# Patient Record
Sex: Female | Born: 1935 | Race: White | Hispanic: No | Marital: Married | Smoking: Former smoker
Health system: Southern US, Community
[De-identification: ages and names within clinical notes are randomized; demographics above are authoritative.]

## PROBLEM LIST (undated history)

## (undated) DIAGNOSIS — H9190 Unspecified hearing loss, unspecified ear: Secondary | ICD-10-CM

## (undated) DIAGNOSIS — M47816 Spondylosis without myelopathy or radiculopathy, lumbar region: Secondary | ICD-10-CM

## (undated) DIAGNOSIS — E785 Hyperlipidemia, unspecified: Secondary | ICD-10-CM

## (undated) DIAGNOSIS — J45909 Unspecified asthma, uncomplicated: Secondary | ICD-10-CM

## (undated) DIAGNOSIS — I739 Peripheral vascular disease, unspecified: Secondary | ICD-10-CM

## (undated) DIAGNOSIS — H5461 Unqualified visual loss, right eye, normal vision left eye: Secondary | ICD-10-CM

## (undated) DIAGNOSIS — I779 Disorder of arteries and arterioles, unspecified: Secondary | ICD-10-CM

## (undated) DIAGNOSIS — I2510 Atherosclerotic heart disease of native coronary artery without angina pectoris: Secondary | ICD-10-CM

## (undated) DIAGNOSIS — I10 Essential (primary) hypertension: Secondary | ICD-10-CM

## (undated) HISTORY — DX: Atherosclerotic heart disease of native coronary artery without angina pectoris: I25.10

## (undated) HISTORY — DX: Essential (primary) hypertension: I10

## (undated) HISTORY — PX: APPENDECTOMY: SHX54

## (undated) HISTORY — DX: Hyperlipidemia, unspecified: E78.5

## (undated) HISTORY — DX: Disorder of arteries and arterioles, unspecified: I77.9

## (undated) HISTORY — DX: Unqualified visual loss, right eye, normal vision left eye: H54.61

## (undated) HISTORY — DX: Unspecified hearing loss, unspecified ear: H91.90

## (undated) HISTORY — DX: Peripheral vascular disease, unspecified: I73.9

## (undated) HISTORY — DX: Unspecified asthma, uncomplicated: J45.909

## (undated) HISTORY — DX: Spondylosis without myelopathy or radiculopathy, lumbar region: M47.816

---

## 1973-06-08 HISTORY — PX: ABDOMINAL HYSTERECTOMY: SHX81

## 1992-06-08 HISTORY — PX: TONSILLECTOMY: SUR1361

## 2006-08-07 HISTORY — PX: CORONARY ANGIOPLASTY WITH STENT PLACEMENT: SHX49

## 2010-06-08 HISTORY — PX: RENAL ANGIOGRAM: SHX6061

## 2012-02-12 ENCOUNTER — Other Ambulatory Visit: Payer: Self-pay | Admitting: Family Medicine

## 2012-02-12 DIAGNOSIS — Z1231 Encounter for screening mammogram for malignant neoplasm of breast: Secondary | ICD-10-CM

## 2012-03-02 ENCOUNTER — Ambulatory Visit
Admission: RE | Admit: 2012-03-02 | Discharge: 2012-03-02 | Disposition: A | Discharge status: Home / Self Care | Payer: Medicare HMO | Source: Ambulatory Visit | Attending: Family Medicine | Admitting: Family Medicine

## 2012-03-02 DIAGNOSIS — Z1231 Encounter for screening mammogram for malignant neoplasm of breast: Secondary | ICD-10-CM

## 2012-05-24 DIAGNOSIS — H53439 Sector or arcuate defects, unspecified eye: Secondary | ICD-10-CM | POA: Insufficient documentation

## 2012-05-24 DIAGNOSIS — H40001 Preglaucoma, unspecified, right eye: Secondary | ICD-10-CM | POA: Insufficient documentation

## 2012-05-24 DIAGNOSIS — H469 Unspecified optic neuritis: Secondary | ICD-10-CM | POA: Insufficient documentation

## 2013-01-23 ENCOUNTER — Other Ambulatory Visit: Payer: Self-pay

## 2013-01-23 DIAGNOSIS — Z1231 Encounter for screening mammogram for malignant neoplasm of breast: Secondary | ICD-10-CM

## 2013-03-06 ENCOUNTER — Ambulatory Visit
Admission: RE | Admit: 2013-03-06 | Discharge: 2013-03-06 | Disposition: A | Discharge status: Home / Self Care | Payer: Medicare HMO | Source: Ambulatory Visit

## 2013-03-06 DIAGNOSIS — Z1231 Encounter for screening mammogram for malignant neoplasm of breast: Secondary | ICD-10-CM

## 2013-03-13 ENCOUNTER — Other Ambulatory Visit: Payer: Self-pay | Admitting: Family Medicine

## 2013-03-13 DIAGNOSIS — R928 Other abnormal and inconclusive findings on diagnostic imaging of breast: Secondary | ICD-10-CM

## 2013-03-27 ENCOUNTER — Other Ambulatory Visit: Payer: Medicare HMO

## 2013-03-29 ENCOUNTER — Ambulatory Visit: Payer: Medicare HMO | Admitting: Cardiovascular Disease

## 2013-03-30 ENCOUNTER — Ambulatory Visit
Admission: RE | Admit: 2013-03-30 | Discharge: 2013-03-30 | Disposition: A | Discharge status: Home / Self Care | Payer: Medicare HMO | Source: Ambulatory Visit | Attending: Family Medicine | Admitting: Family Medicine

## 2013-03-30 DIAGNOSIS — R928 Other abnormal and inconclusive findings on diagnostic imaging of breast: Secondary | ICD-10-CM

## 2013-03-31 ENCOUNTER — Encounter: Payer: Self-pay | Admitting: Cardiovascular Disease

## 2013-03-31 ENCOUNTER — Ambulatory Visit (INDEPENDENT_AMBULATORY_CARE_PROVIDER_SITE_OTHER): Payer: Medicare HMO | Admitting: Cardiovascular Disease

## 2013-03-31 VITALS — BP 162/82 | HR 56 | Ht 65.5 in | Wt 150.0 lb

## 2013-03-31 DIAGNOSIS — I10 Essential (primary) hypertension: Secondary | ICD-10-CM

## 2013-03-31 DIAGNOSIS — E785 Hyperlipidemia, unspecified: Secondary | ICD-10-CM

## 2013-03-31 DIAGNOSIS — I2510 Atherosclerotic heart disease of native coronary artery without angina pectoris: Secondary | ICD-10-CM | POA: Insufficient documentation

## 2013-03-31 NOTE — Progress Notes (Signed)
03/31/2013 Kendra [name]   [birth year]  [id number]  Primary Physician MCNEILL,WENDY, MD Primary Cardiologist: Kendra Gess MD Roseanne Reno   HPI:  Kendra [name] is a 77 year old mildly overweight married Caucasian female mother of one daughter who recently relocated within the last year or 2 from [place] to be [place] to be closer to her daughter. She self-referred to be established in our practice for ongoing cardiac care. She had previously seen Dr. Donato Schultz. Her risk factors for heart disease are remarkable for treated hypertension, hyper-lipidemia and family history. Her mother did have 2 myocardial infarctions. She has never smoked. She had 2 overlapping stents placed in her LAD in [facility] clinic March 08 has been asystematic since. She had negative Myoview one year ago.   Current Outpatient Prescriptions  Medication Sig Dispense Refill  . ALBUTEROL IN Inhale into the lungs as needed.      [name] amLODipine (NORVASC) 5 MG tablet Take 5 mg by mouth. 1/4 tablet daily      . aspirin 81 MG tablet Take 81 mg by mouth daily.      . beclomethasone (QVAR) 80 MCG/ACT inhaler Inhale 2 puffs into the lungs 2 (two) times daily.      . Clobetasol Propionate (CLOBETASOL 17 PROPIONATE) 0.5 % POWD by Does not apply route as needed.      . cloNIDine (CATAPRES) 0.1 MG tablet Take 0.1 mg by mouth as needed.      . Coenzyme Q10 (CO Q-10) 100 MG CAPS Take 1 tablet by mouth daily.      . Cranberry-Vitamin C-Probiotic (AZO CRANBERRY) 250-30 MG TABS Take 250 mg by mouth daily.      [name] estradiol (ESTRACE) 0.1 MG/GM vaginal cream Place 2 g vaginally once a week. 1/2 applicator PV      . Glucosamine-Chondroit-Vit C-Mn (GLUCOSAMINE CHONDR 1500 COMPLX PO) Take 1 tablet by mouth daily.      . hydrALAZINE (APRESOLINE) 25 MG tablet Take by mouth. 1/2 tab bid      . hydrocortisone 2.5 % cream Apply topically as needed.      [name] losartan (COZAAR) 50 MG tablet Take 50 mg by mouth 2 (two) times  daily.      . metaxalone (SKELAXIN) 800 MG tablet Take 800 mg by mouth. 1/2 tablet qid prn      . Multiple Vitamin (MULTIVITAMIN) capsule Take 1 capsule by mouth daily.      . rosuvastatin (CRESTOR) 5 MG tablet Take 5 mg by mouth daily.      . timolol (BETIMOL) 0.5 % ophthalmic solution Place 1 drop into the right eye daily.      . Vitamin D, Ergocalciferol, (DRISDOL) 50000 UNITS CAPS capsule Take 1,000 Units by mouth daily.       No current facility-administered medications for this visit.    Allergies  Allergen Reactions  . Statins     Unable to tolerate statins w the exception of crestor.  . Sulfa Antibiotics   . Zetia [Ezetimibe]   . Penicillins Rash    History   Social History  . Marital Status: Married    Spouse Name: N/A    Number of Children: N/A  . Years of Education: N/A   Occupational History  . Not on file.   Social History Main Topics  . Smoking status: Former Smoker    Quit date: 06/08/1966  . Smokeless tobacco: Not on file  . Alcohol Use: 0.5 oz/week    1 drink(s)  per week     Comment: wine  . Drug Use: Not on file  . Sexual Activity: Not on file   Other Topics Concern  . Not on file   Social History Narrative  . No narrative on file     Review of Systems: General: negative for chills, fever, night sweats or weight changes.  Cardiovascular: negative for chest pain, dyspnea on exertion, edema, orthopnea, palpitations, paroxysmal nocturnal dyspnea or shortness of breath Dermatological: negative for rash Respiratory: negative for cough or wheezing Urologic: negative for hematuria Abdominal: negative for nausea, vomiting, diarrhea, bright red blood per rectum, melena, or hematemesis Neurologic: negative for visual changes, syncope, or dizziness All other systems reviewed and are otherwise negative except as noted above.    Blood pressure 162/82, pulse 56, height 5' 5.5" (1.664 m), weight 150 lb (68.04 kg).  General appearance: alert and no  distress Neck: no adenopathy, no carotid bruit, no JVD, supple, symmetrical, trachea midline and thyroid not enlarged, symmetric, no tenderness/mass/nodules Lungs: clear to auscultation bilaterally Heart: regular rate and rhythm, S1, S2 normal, no murmur, click, rub or gallop Abdomen: soft, non-tender; bowel sounds normal; no masses,  no organomegaly Extremities: extremities normal, atraumatic, no cyanosis or edema Pulses: 2+ and symmetric  EKG sinus bradycardia at 56 without ST or T wave changes  ASSESSMENT AND PLAN:   Coronary artery disease Status post LAD stenting in [facility] clinic March of 08 with overlapping 2.5 mm Taxus drug-eluting stents. She had a Myoview stress test performed one year ago which was nonischemic. She is asymptomatic.  Essential hypertension Well-controlled on current medications  Hyperlipidemia On statin therapy. We'll obtain her most recent laboratory her primary care physician      Kendra Gess MD [facility], [facility] 03/31/2013 3:54 PM

## 2013-03-31 NOTE — Assessment & Plan Note (Signed)
On statin therapy. We'll obtain her most recent laboratory her primary care physician

## 2013-03-31 NOTE — Assessment & Plan Note (Signed)
Well-controlled on current medications 

## 2013-03-31 NOTE — Assessment & Plan Note (Signed)
Status post LAD stenting in [facility] clinic March of 08 with overlapping 2.5 mm Taxus drug-eluting stents. She had a Myoview stress test performed one year ago which was nonischemic. She is asymptomatic.

## 2013-03-31 NOTE — Patient Instructions (Signed)
Your physician wants you to follow-up in: 1 year with Dr Berry. You will receive a reminder letter in the mail two months in advance. If you don't receive a letter, please call our office to schedule the follow-up appointment.  

## 2013-07-06 ENCOUNTER — Ambulatory Visit: Payer: Medicare HMO | Admitting: Cardiology

## 2013-08-07 ENCOUNTER — Ambulatory Visit: Payer: Medicare HMO | Attending: Family Medicine | Admitting: Physical Therapy

## 2013-08-07 DIAGNOSIS — IMO0001 Reserved for inherently not codable concepts without codable children: Secondary | ICD-10-CM | POA: Insufficient documentation

## 2013-08-07 DIAGNOSIS — M542 Cervicalgia: Secondary | ICD-10-CM | POA: Insufficient documentation

## 2013-08-07 DIAGNOSIS — M62838 Other muscle spasm: Secondary | ICD-10-CM | POA: Insufficient documentation

## 2013-08-07 DIAGNOSIS — M25519 Pain in unspecified shoulder: Secondary | ICD-10-CM | POA: Insufficient documentation

## 2013-08-09 ENCOUNTER — Ambulatory Visit: Payer: Medicare HMO | Admitting: Physical Therapy

## 2013-08-10 ENCOUNTER — Ambulatory Visit: Payer: Medicare HMO | Admitting: Physical Therapy

## 2013-08-15 ENCOUNTER — Ambulatory Visit: Payer: Medicare HMO | Admitting: Physical Therapy

## 2013-08-16 ENCOUNTER — Ambulatory Visit: Payer: Medicare HMO | Admitting: Physical Therapy

## 2013-08-17 ENCOUNTER — Ambulatory Visit: Payer: Medicare HMO | Admitting: Physical Therapy

## 2013-08-21 ENCOUNTER — Ambulatory Visit: Payer: Medicare HMO | Admitting: Physical Therapy

## 2013-08-23 ENCOUNTER — Ambulatory Visit: Payer: Medicare HMO | Admitting: Physical Therapy

## 2013-08-24 ENCOUNTER — Encounter: Payer: Medicare HMO | Admitting: Physical Therapy

## 2013-08-29 ENCOUNTER — Ambulatory Visit: Payer: Medicare HMO | Admitting: Physical Therapy

## 2013-08-30 ENCOUNTER — Encounter: Payer: Medicare HMO | Admitting: Physical Therapy

## 2013-09-01 ENCOUNTER — Ambulatory Visit: Payer: Medicare HMO | Admitting: Physical Therapy

## 2013-09-04 ENCOUNTER — Encounter: Payer: Medicare HMO | Admitting: Physical Therapy

## 2013-09-05 ENCOUNTER — Ambulatory Visit: Payer: Medicare HMO | Admitting: Physical Therapy

## 2013-09-06 ENCOUNTER — Ambulatory Visit: Payer: Medicare HMO | Attending: Family Medicine | Admitting: Physical Therapy

## 2013-09-06 DIAGNOSIS — M25519 Pain in unspecified shoulder: Secondary | ICD-10-CM | POA: Insufficient documentation

## 2013-09-06 DIAGNOSIS — M62838 Other muscle spasm: Secondary | ICD-10-CM | POA: Insufficient documentation

## 2013-09-06 DIAGNOSIS — IMO0001 Reserved for inherently not codable concepts without codable children: Secondary | ICD-10-CM | POA: Insufficient documentation

## 2013-09-06 DIAGNOSIS — M542 Cervicalgia: Secondary | ICD-10-CM | POA: Insufficient documentation

## 2013-09-12 ENCOUNTER — Ambulatory Visit: Payer: Medicare HMO | Admitting: Physical Therapy

## 2013-09-14 ENCOUNTER — Encounter: Payer: Medicare HMO | Admitting: Physical Therapy

## 2013-10-16 ENCOUNTER — Ambulatory Visit: Payer: Medicare HMO | Attending: Family Medicine

## 2013-10-16 DIAGNOSIS — R5381 Other malaise: Secondary | ICD-10-CM | POA: Insufficient documentation

## 2013-10-16 DIAGNOSIS — IMO0001 Reserved for inherently not codable concepts without codable children: Secondary | ICD-10-CM | POA: Insufficient documentation

## 2013-10-16 DIAGNOSIS — M545 Low back pain, unspecified: Secondary | ICD-10-CM | POA: Insufficient documentation

## 2013-10-16 DIAGNOSIS — M25659 Stiffness of unspecified hip, not elsewhere classified: Secondary | ICD-10-CM | POA: Insufficient documentation

## 2013-10-23 ENCOUNTER — Ambulatory Visit: Payer: Medicare HMO | Admitting: Physical Therapy

## 2013-10-25 ENCOUNTER — Ambulatory Visit: Payer: Medicare HMO | Admitting: Physical Therapy

## 2013-11-02 ENCOUNTER — Ambulatory Visit: Payer: Medicare HMO | Admitting: Physical Therapy

## 2013-11-04 ENCOUNTER — Encounter (HOSPITAL_COMMUNITY): Payer: Self-pay | Admitting: Emergency Medicine

## 2013-11-04 ENCOUNTER — Emergency Department (HOSPITAL_COMMUNITY): Payer: Medicare HMO

## 2013-11-04 ENCOUNTER — Emergency Department (HOSPITAL_COMMUNITY)
Admission: EM | Admit: 2013-11-04 | Discharge: 2013-11-04 | Disposition: A | Discharge status: Home / Self Care | Payer: Medicare HMO | Attending: Emergency Medicine | Admitting: Emergency Medicine

## 2013-11-04 DIAGNOSIS — Z9861 Coronary angioplasty status: Secondary | ICD-10-CM | POA: Insufficient documentation

## 2013-11-04 DIAGNOSIS — R200 Anesthesia of skin: Secondary | ICD-10-CM

## 2013-11-04 DIAGNOSIS — Z880 Allergy status to penicillin: Secondary | ICD-10-CM | POA: Insufficient documentation

## 2013-11-04 DIAGNOSIS — Z79899 Other long term (current) drug therapy: Secondary | ICD-10-CM | POA: Insufficient documentation

## 2013-11-04 DIAGNOSIS — R209 Unspecified disturbances of skin sensation: Secondary | ICD-10-CM | POA: Insufficient documentation

## 2013-11-04 DIAGNOSIS — E785 Hyperlipidemia, unspecified: Secondary | ICD-10-CM | POA: Insufficient documentation

## 2013-11-04 DIAGNOSIS — Z87891 Personal history of nicotine dependence: Secondary | ICD-10-CM | POA: Insufficient documentation

## 2013-11-04 DIAGNOSIS — I2510 Atherosclerotic heart disease of native coronary artery without angina pectoris: Secondary | ICD-10-CM | POA: Insufficient documentation

## 2013-11-04 DIAGNOSIS — I10 Essential (primary) hypertension: Secondary | ICD-10-CM | POA: Insufficient documentation

## 2013-11-04 DIAGNOSIS — IMO0002 Reserved for concepts with insufficient information to code with codable children: Secondary | ICD-10-CM | POA: Insufficient documentation

## 2013-11-04 DIAGNOSIS — Z7982 Long term (current) use of aspirin: Secondary | ICD-10-CM | POA: Insufficient documentation

## 2013-11-04 LAB — CBC
HEMATOCRIT: 39.5 % (ref 36.0–46.0)
Hemoglobin: 13.6 g/dL (ref 12.0–15.0)
MCH: 29.8 pg (ref 26.0–34.0)
MCHC: 34.4 g/dL (ref 30.0–36.0)
MCV: 86.6 fL (ref 78.0–100.0)
Platelets: 233 10*3/uL (ref 150–400)
RBC: 4.56 MIL/uL (ref 3.87–5.11)
RDW: 14.2 % (ref 11.5–15.5)
WBC: 8.5 10*3/uL (ref 4.0–10.5)

## 2013-11-04 LAB — I-STAT TROPONIN, ED
TROPONIN I, POC: 0.01 ng/mL (ref 0.00–0.08)
Troponin i, poc: 0.01 ng/mL (ref 0.00–0.08)

## 2013-11-04 LAB — BASIC METABOLIC PANEL
BUN: 15 mg/dL (ref 6–23)
CHLORIDE: 100 meq/L (ref 96–112)
CO2: 26 meq/L (ref 19–32)
CREATININE: 0.62 mg/dL (ref 0.50–1.10)
Calcium: 9.7 mg/dL (ref 8.4–10.5)
GFR calc Af Amer: >90 mL/min (ref >90)
GFR calc non Af Amer: 86 mL/min — ABNORMAL LOW (ref >90)
GLUCOSE: 97 mg/dL (ref 70–99)
Potassium: 5.1 mEq/L (ref 3.7–5.3)
Sodium: 139 mEq/L (ref 137–147)

## 2013-11-04 NOTE — ED Notes (Signed)
Family at bedside. 

## 2013-11-04 NOTE — ED Notes (Signed)
Patient transported to MRI 

## 2013-11-04 NOTE — Discharge Instructions (Signed)
Workup here for the numbness is negative. Negative head CT negative MRI of the brain. Also cardiac markers x2 were normal. No evidence of any acute cardiac event. Chest x-ray was negative. Return for any newer worse symptoms. Recommend following up with your regular Dr. and/or cardiology in the next few days for further evaluation.

## 2013-11-04 NOTE — ED Provider Notes (Signed)
CSN: [id number]     Arrival date & time 11/04/13  1747 History   First MD Initiated Contact with Patient 11/04/13 [place]     Chief Complaint  Patient presents with  . jaw numbness      (Consider location/radiation/quality/duration/timing/severity/associated sxs/prior Treatment) The history is provided by the patient.   78 year old female patient with onset of some left-sided face numbness and left arm numbness that started yesterday lasted for a long period of time then resolved then returned today. Patient had some intermittent elevations in her blood pressure throughout the day. Patient denies any weakness or headache or fever chest pain or shortness of breath. Patient does have a history of coronary artery disease. Patient's had stents in the past. No leg symptoms.  Past Medical History  Diagnosis Date  . HTN (hypertension)   . CAD (coronary artery disease)     last cath 08/2006  . Hyperlipidemia    Past Surgical History  Procedure Laterality Date  . Coronary angioplasty with stent placement  08/2006    taxus stent x2 to LAD, 2.5x29mm and 2.5x10mm; done at [facility]  . Renal angiogram  2012    [facility] in [place]  . Tonsillectomy  1994  . Abdominal hysterectomy  1975   Family History  Problem Relation Age of Onset  . Heart attack Mother     x3; age 93  . Cancer Mother     colon  . Heart attack Father   . Diabetes Father   . Cancer Maternal Grandmother     breast  . Heart attack Paternal Grandfather   . Hyperlipidemia Sister    History  Substance Use Topics  . Smoking status: Former Smoker    Quit date: 06/08/1966  . Smokeless tobacco: Not on file  . Alcohol Use: 0.5 oz/week    1 drink(s) per week     Comment: wine   OB History   Grav Para Term Preterm Abortions TAB SAB Ect Mult Living                 Review of Systems  Constitutional: Negative for fever.  HENT: Negative for congestion.   Respiratory: Negative for chest tightness and  shortness of breath.   Cardiovascular: Negative for chest pain.  Gastrointestinal: Negative for nausea, vomiting and abdominal pain.  Genitourinary: Negative for dysuria.  Musculoskeletal: Negative for back pain.  Skin: Negative for rash.  Neurological: Positive for numbness. Negative for dizziness, facial asymmetry, speech difficulty, weakness and headaches.  Hematological: Bruises/bleeds easily.  Psychiatric/Behavioral: Negative for confusion.      Allergies  Statins; Sulfa antibiotics; Zetia; and Penicillins  Home Medications   Prior to Admission medications   Medication Sig Start Date End Date Taking? Authorizing Provider  albuterol (PROVENTIL HFA;VENTOLIN HFA) 108 (90 BASE) MCG/ACT inhaler Inhale 1-2 puffs into the lungs every 6 (six) hours as needed for wheezing or shortness of breath.   Yes Historical Provider, MD  amLODipine (NORVASC) 5 MG tablet Take 1.25 mg by mouth daily. 1/4 tablet daily   Yes Historical Provider, MD  aspirin EC 81 MG tablet Take 81 mg by mouth daily.   Yes Historical Provider, MD  beclomethasone (QVAR) 80 MCG/ACT inhaler Inhale 2 puffs into the lungs 2 (two) times daily.   Yes Historical Provider, MD  BIOTIN FORTE PO Take 1 capsule by mouth daily.   Yes Historical Provider, MD  cholecalciferol (VITAMIN D) 1000 UNITS tablet Take 1,000 Units by mouth daily.   Yes Historical Provider, MD  clobetasol (OLUX) 0.05 % topical foam Apply 1 application topically every 3 (three) days.   Yes Historical Provider, MD  cloNIDine (CATAPRES) 0.1 MG tablet Take 0.1 mg by mouth daily as needed (high BP > 170).    Yes Historical Provider, MD  Coenzyme Q10 (CO Q-10) 100 MG CAPS Take 100 mg by mouth daily.    Yes Historical Provider, MD  Cranberry-Vitamin C-Probiotic (AZO CRANBERRY) 250-30 MG TABS Take 250 mg by mouth daily.   Yes Historical Provider, MD  ECHINACEA-ZINC-VITAMIN C PO Take 1 tablet by mouth daily.   Yes Historical Provider, MD  Glucosamine-Chondroit-Vit C-Mn  (GLUCOSAMINE CHONDR 1500 COMPLX PO) Take 1 tablet by mouth daily.   Yes Historical Provider, MD  hydrALAZINE (APRESOLINE) 25 MG tablet Take 12.5 mg by mouth 2 (two) times daily.    Yes Historical Provider, MD  hydrocortisone 2.5 % cream Apply 1 application topically 2 (two) times daily as needed (skin irritation).    Yes Historical Provider, MD  losartan (COZAAR) 50 MG tablet Take 50 mg by mouth 2 (two) times daily.   Yes Historical Provider, MD  metaxalone (SKELAXIN) 800 MG tablet Take 400 mg by mouth 4 (four) times daily as needed for muscle spasms.    Yes Historical Provider, MD  Multiple Vitamin (MULTIVITAMIN) capsule Take 1 capsule by mouth daily.   Yes Historical Provider, MD  rosuvastatin (CRESTOR) 5 MG tablet Take 5 mg by mouth every other day.    Yes Historical Provider, MD   BP 165/74  Pulse 48  Temp(Src) 99.6 F (37.6 C)  Resp 13  SpO2 99% Physical Exam  Nursing note and vitals reviewed. Constitutional: She is oriented to person, place, and time. She appears well-developed and well-nourished. No distress.  HENT:  Head: Normocephalic and atraumatic.  Eyes: Conjunctivae and EOM are normal. Pupils are equal, round, and reactive to light.  Neck: Normal range of motion. Neck supple.  Cardiovascular: Normal rate, regular rhythm and normal heart sounds.   No murmur heard. Pulmonary/Chest: Effort normal and breath sounds normal. No respiratory distress.  Abdominal: Soft. Bowel sounds are normal. There is no tenderness.  Musculoskeletal: Normal range of motion. She exhibits no edema.  Neurological: She is alert and oriented to person, place, and time. No cranial nerve deficit. She exhibits normal muscle tone. Coordination normal.  Skin: Skin is warm. No rash noted.    ED Course  Procedures (including critical care time) Labs Review Labs Reviewed  BASIC METABOLIC PANEL - Abnormal; Notable for the following:    GFR calc non Af Amer 86 (*)    All other components within normal  limits  CBC  I-STAT TROPOININ, ED  I-STAT TROPOININ, ED   Results for orders placed during the hospital encounter of 11/04/13  CBC      Result Value Ref Range   WBC 8.5  4.0 - 10.5 K/uL   RBC 4.56  3.87 - 5.11 MIL/uL   Hemoglobin 13.6  12.0 - 15.0 g/dL   HCT 39.5  36.0 - 46.0 %   MCV 86.6  78.0 - 100.0 fL   MCH 29.8  26.0 - 34.0 pg   MCHC 34.4  30.0 - 36.0 g/dL   RDW 14.2  11.5 - 15.5 %   Platelets 233  150 - 400 K/uL  BASIC METABOLIC PANEL      Result Value Ref Range   Sodium 139  137 - 147 mEq/L   Potassium 5.1  3.7 - 5.3 mEq/L   Chloride 100  96 - 112 mEq/L   CO2 26  19 - 32 mEq/L   Glucose, Bld 97  70 - 99 mg/dL   BUN 15  6 - 23 mg/dL   Creatinine, Ser 0.62  0.50 - 1.10 mg/dL   Calcium 9.7  8.4 - 10.5 mg/dL   GFR calc non Af Amer 86 (*) >90 mL/min   GFR calc Af Amer >90  >90 mL/min  I-STAT TROPOININ, ED      Result Value Ref Range   Troponin i, poc 0.01  0.00 - 0.08 ng/mL   Comment 3           I-STAT TROPOININ, ED      Result Value Ref Range   Troponin i, poc 0.01  0.00 - 0.08 ng/mL   Comment 3              Imaging Review Dg Chest 2 View  11/04/2013   CLINICAL DATA:  Left-sided facial numbness  EXAM: CHEST  2 VIEW  COMPARISON:  None.  FINDINGS: Normal cardiac silhouette with ectatic aorta. Normal pulmonary vasculature. No effusion, infiltrate, or pneumothorax.  IMPRESSION: No acute cardiopulmonary process.   Electronically Signed   By: Suzy Bouchard M.D.   On: 11/04/2013 19:37   Ct Head Wo Contrast  11/04/2013   CLINICAL DATA:  Left arm numbness, facial numbness  EXAM: CT HEAD WITHOUT CONTRAST  TECHNIQUE: Contiguous axial images were obtained from the base of the skull through the vertex without intravenous contrast.  COMPARISON:  None.  FINDINGS: There is no evidence of mass effect, midline shift or extra-axial fluid collections. There is no evidence of a space-occupying lesion or intracranial hemorrhage. There is no evidence of a cortical-based area of acute  infarction.  The ventricles and sulci are appropriate for the patient's age. The basal cisterns are patent.  Visualized portions of the orbits are unremarkable. Small left sphenoid sinus air-fluid level. There are cerebral vascular atherosclerotic changes.  The osseous structures are unremarkable.  IMPRESSION: No acute intracranial pathology.   Electronically Signed   By: Kathreen Devoid   On: 11/04/2013 19:20   Mr Brain Wo Contrast  11/04/2013   CLINICAL DATA:  Left-sided numbness  EXAM: MRI HEAD WITHOUT CONTRAST  TECHNIQUE: Multiplanar, multiecho pulse sequences of the brain and surrounding structures were obtained without intravenous contrast.  COMPARISON:  Prior CT from earlier the same day  FINDINGS: The CSF containing spaces are within normal limits for patient age. No focal parenchymal signal abnormality is identified. No mass lesion, midline shift, or extra-axial fluid collection. Ventricles are normal in size without evidence of hydrocephalus.  No diffusion-weighted signal abnormality is identified to suggest acute intracranial infarct. Gray-white matter differentiation is maintained. Normal flow voids are seen within the intracranial vasculature. No intracranial hemorrhage identified.  The cervicomedullary junction is normal. Pituitary gland is within normal limits. Pituitary stalk is midline. The globes and optic nerves demonstrate a normal appearance with normal signal intensity.  The bone marrow signal intensity is normal. Calvarium is intact. Visualized upper cervical spine is within normal limits.  Scalp soft tissues are unremarkable.  Small amount of T2 hyperintense fluid signal intensity seen within the posterior left sphenoid sinus. The paranasal sinuses are otherwise clear. No mastoid effusion  IMPRESSION: Unremarkable MRI of the brain with no acute intracranial infarct or other abnormality identified.   Electronically Signed   By: Jeannine Boga M.D.   On: 11/04/2013 23:07     EKG  Interpretation   Date/Time:  Saturday Nov 04 2013 17:54:02 EDT Ventricular Rate:  52 PR Interval:  164 QRS Duration: 98 QT Interval:  456 QTC Calculation: 424 R Axis:   69 Text Interpretation:  Sinus bradycardia Otherwise normal ECG No previous  ECGs available Confirmed by Mariselda Badalamenti  MD, Dakia Schifano ([id number]) on 11/04/2013  6:37:43 PM      MDM   Final diagnoses:  Numbness    Exact etiology of the left-sided numbness to the arm and face is not clear. MRI is negative for any abnormalities no evidence of stroke. Doubt acute cardiac event 2 negative troponins chest x-ray without pneumonia or pulmonary edema. EKG without acute changes. Would recommend followup with her cardiologist and her primary care Dr. if the numbness persists neurology referral would be appropriate. Patient nontoxic no acute distress a safe to be discharged home at this time.    Fredia Sorrow, MD 11/04/13 [phone]

## 2013-11-04 NOTE — ED Notes (Signed)
Patient is alert and orientedx4.  Patient was explained discharge instructions and they understood them with no questions.  The patient's husband, Bali Lyn is taking her home.

## 2013-11-04 NOTE — ED Notes (Signed)
The pt started having some lt jaw numbness and some lt arm numbness since yesterday.  This afternoon her bp was elevated and she had some of the numbness return this afternoon sl numbness lt face .  She is not sure if she still has the numbness..  The face numbness has stayed since yesterday

## 2013-11-06 ENCOUNTER — Ambulatory Visit: Payer: Medicare HMO | Attending: Family Medicine

## 2013-11-06 DIAGNOSIS — M545 Low back pain, unspecified: Secondary | ICD-10-CM | POA: Insufficient documentation

## 2013-11-06 DIAGNOSIS — R5381 Other malaise: Secondary | ICD-10-CM | POA: Insufficient documentation

## 2013-11-06 DIAGNOSIS — IMO0001 Reserved for inherently not codable concepts without codable children: Secondary | ICD-10-CM | POA: Insufficient documentation

## 2013-11-06 DIAGNOSIS — M259 Joint disorder, unspecified: Secondary | ICD-10-CM | POA: Insufficient documentation

## 2013-11-10 ENCOUNTER — Ambulatory Visit: Payer: Medicare HMO | Admitting: Physical Therapy

## 2013-11-10 ENCOUNTER — Other Ambulatory Visit: Payer: Self-pay | Admitting: Family Medicine

## 2013-11-10 DIAGNOSIS — G459 Transient cerebral ischemic attack, unspecified: Secondary | ICD-10-CM

## 2013-11-13 ENCOUNTER — Ambulatory Visit: Payer: Medicare HMO | Admitting: Physical Therapy

## 2013-11-15 ENCOUNTER — Ambulatory Visit
Admission: RE | Admit: 2013-11-15 | Discharge: 2013-11-15 | Disposition: A | Discharge status: Home / Self Care | Payer: Medicare HMO | Source: Ambulatory Visit | Attending: Family Medicine | Admitting: Family Medicine

## 2013-11-15 ENCOUNTER — Encounter: Payer: Self-pay | Admitting: *Deleted

## 2013-11-15 DIAGNOSIS — G459 Transient cerebral ischemic attack, unspecified: Secondary | ICD-10-CM

## 2013-11-16 ENCOUNTER — Encounter: Payer: Self-pay | Admitting: *Deleted

## 2013-11-17 ENCOUNTER — Ambulatory Visit: Payer: Medicare HMO | Admitting: Physical Therapy

## 2013-11-17 ENCOUNTER — Encounter: Payer: Self-pay | Admitting: Neurology

## 2013-11-17 ENCOUNTER — Ambulatory Visit (INDEPENDENT_AMBULATORY_CARE_PROVIDER_SITE_OTHER): Payer: Medicare PPO | Admitting: Neurology

## 2013-11-17 VITALS — BP 158/67 | HR 64 | Ht 66.0 in | Wt 146.0 lb

## 2013-11-17 DIAGNOSIS — R209 Unspecified disturbances of skin sensation: Secondary | ICD-10-CM | POA: Insufficient documentation

## 2013-11-17 NOTE — Patient Instructions (Signed)

## 2013-11-17 NOTE — Progress Notes (Signed)
Reason for visit: Transient sensory change  Kendra [name] is a 78 y.o. female  History of present illness:  Ms. Speas is a 78 year old right-handed white female with a history of significant hypertension. The patient is on 4 different antihypertensive medications secondary to difficult to control hypertension. The patient had an event that occurred on 11/03/2013 with left perioral numbness and some transient weakness involving the left hand. This lasted about one half hour, and then cleared completely. His was unassociated with the headache, visual changes, or gait disturbance. The patient had no confusion around the time of the event. The patient had a second event on 11/04/2013 lasting about 2 hours. The second event was identical to the first. The patient went to the emergency room, and blood work was drawn, and a MRI of the brain was done that did not show any significant abnormalities, no evidence of an acute stroke. The patient has been set up for a carotid Doppler study that shows 50-69% stenosis of the left internal carotid artery. The right internal carotid artery was unremarkable. The patient is on low-dose aspirin taking 81 mg daily. She was on aspirin at the time of the above events. She comes to this office for an evaluation.  Past Medical History  Diagnosis Date  . HTN (hypertension)   . CAD (coronary artery disease)     last cath 08/2006  . Hyperlipidemia   . Asthma   . Hearing loss   . DJD (degenerative joint disease), lumbar   . Vision loss of right eye     Past Surgical History  Procedure Laterality Date  . Coronary angioplasty with stent placement  08/2006    taxus stent x2 to LAD, 2.5x65mm and 2.5x15mm; done at [facility]  . Renal angiogram  2012    [facility] in [place]  . Tonsillectomy  1994  . Abdominal hysterectomy  1975  . Appendectomy      Family History  Problem Relation Age of Onset  . Heart attack Mother     x3; age 15  . Cancer  Mother     colon  . Heart attack Father   . Diabetes Father   . Cancer Maternal Grandmother     breast  . Heart attack Paternal Grandfather   . Hyperlipidemia Sister     Social history:  reports that she quit smoking about 47 years ago. She has never used smokeless tobacco. She reports that she drinks about .5 ounces of alcohol per week. She reports that she does not use illicit drugs.  Medications:  Current Outpatient Prescriptions on File Prior to Visit  Medication Sig Dispense Refill  . albuterol (PROVENTIL HFA;VENTOLIN HFA) 108 (90 BASE) MCG/ACT inhaler Inhale 1-2 puffs into the lungs every 6 (six) hours as needed for wheezing or shortness of breath.      [name] amLODipine (NORVASC) 5 MG tablet Take 1.25 mg by mouth daily. 1/4 tablet daily      . beclomethasone (QVAR) 80 MCG/ACT inhaler Inhale 2 puffs into the lungs 2 (two) times daily.      [name] BIOTIN FORTE PO Take 1 capsule by mouth daily.      . cholecalciferol (VITAMIN D) 1000 UNITS tablet Take 1,000 Units by mouth daily.      . clobetasol (OLUX) 0.05 % topical foam Apply 1 application topically every 3 (three) days.      . cloNIDine (CATAPRES) 0.1 MG tablet Take 0.1 mg by mouth daily as needed (high BP > 170).       [name]  Coenzyme Q10 (CO Q-10) 100 MG CAPS Take 100 mg by mouth daily.       . Cranberry-Vitamin C-Probiotic (AZO CRANBERRY) 250-30 MG TABS Take 250 mg by mouth daily.      [name] ECHINACEA-ZINC-VITAMIN C PO Take 1 tablet by mouth daily.      . Glucosamine-Chondroit-Vit C-Mn (GLUCOSAMINE CHONDR 1500 COMPLX PO) Take 1 tablet by mouth daily.      . hydrALAZINE (APRESOLINE) 25 MG tablet Take 12.5 mg by mouth 2 (two) times daily.       . hydrocortisone 2.5 % cream Apply 1 application topically 2 (two) times daily as needed (skin irritation).       [name] losartan (COZAAR) 50 MG tablet Take 50 mg by mouth 2 (two) times daily.      . metaxalone (SKELAXIN) 800 MG tablet Take 400 mg by mouth 4 (four) times daily as needed for muscle spasms.         . Multiple Vitamin (MULTIVITAMIN) capsule Take 1 capsule by mouth daily.      . rosuvastatin (CRESTOR) 5 MG tablet Take 5 mg by mouth every other day.        No current facility-administered medications on file prior to visit.      Allergies  Allergen Reactions  . Statins     Unable to tolerate statins w the exception of crestor.  . Sulfa Antibiotics Other (See Comments)    "didnt feel good"  . Zetia [Ezetimibe] Other (See Comments)    "makes me constipated"  . Penicillins Rash    ROS:  Out of a complete 14 system review of symptoms, the patient complains only of the following symptoms, and all other reviewed systems are negative.  Hearing loss Easy bruising Joint pain, arthritis Allergies  Blood pressure 158/67, pulse 64, height 5\' 6"  (1.676 m), weight 146 lb (66.225 kg).  Physical Exam  General: The patient is alert and cooperative at the time of the examination.  Eyes: Pupils are equal, round, and reactive to light. Discs are flat bilaterally.  Neck: The neck is supple, no carotid bruits are noted.  Respiratory: The respiratory examination is clear.  Cardiovascular: The cardiovascular examination reveals a regular rate and rhythm, no obvious murmurs or rubs are noted.  Skin: Extremities are without significant edema.  Neurologic Exam  Mental status: The patient is alert and oriented x 3 at the time of the examination. The patient has apparent normal recent and remote memory, with an apparently normal attention span and concentration ability.  Cranial nerves: Facial symmetry is present. There is good sensation of the face to pinprick and soft touch bilaterally. The strength of the facial muscles and the muscles to head turning and shoulder shrug are normal bilaterally. Speech is well enunciated, no aphasia or dysarthria is noted. Extraocular movements are full, with exception that with superior gaze there is exotropia of the right eye. Visual fields are full. The  tongue is midline, and the patient has symmetric elevation of the soft palate. No obvious hearing deficits are noted.  Motor: The motor testing reveals 5 over 5 strength of all 4 extremities. Good symmetric motor tone is noted throughout.  Sensory: Sensory testing is intact to pinprick, soft touch, vibration sensation, and position sense on all 4 extremities, with exception that position sense is decreased on the right foot. No evidence of extinction is noted.  Coordination: Cerebellar testing reveals good finger-nose-finger and heel-to-shin bilaterally.  Gait and station: Gait is normal. Tandem gait is normal. Romberg  is negative. No drift is seen.  Reflexes: Deep tendon reflexes are symmetric and normal bilaterally. Toes are downgoing bilaterally.   MRI brain 11/04/2013:  IMPRESSION:  Unremarkable MRI of the brain with no acute intracranial infarct or  other abnormality identified.   Carotid Doppler study 11/15/2013:  IMPRESSION:  50-69% stenosis in the left internal carotid artery. No focal  stenosis is noted on the right.     Assessment/Plan:  1. Probable TIA event  2. Hypertension  3. Dyslipidemia  4. Left internal carotid artery stenosis  The patient does have risk factors for stroke, and the above events may have represented a small vessel event. The patient had no acute stroke by MRI. The patient will go up on aspirin dose taking 325 mg daily. She will have a 2-D echocardiogram done. Otherwise, the patient will followup through this office if needed. The carotid Doppler study needs to be followed for the asymptomatic stenosis on the left internal carotid artery, getting carotid Doppler evaluations annually.  Jill Alexanders MD 11/17/2013 2:27 PM  [facility] [address] [place], [place] [address]  Phone [phone] Fax [phone]

## 2013-11-20 ENCOUNTER — Ambulatory Visit: Payer: Medicare HMO

## 2013-11-22 ENCOUNTER — Ambulatory Visit: Payer: Medicare HMO

## 2013-11-28 ENCOUNTER — Ambulatory Visit (HOSPITAL_COMMUNITY)
Admission: RE | Admit: 2013-11-28 | Discharge: 2013-11-28 | Disposition: A | Discharge status: Home / Self Care | Payer: Medicare HMO | Source: Ambulatory Visit | Attending: Neurology | Admitting: Neurology

## 2013-11-28 ENCOUNTER — Telehealth: Payer: Self-pay | Admitting: Neurology

## 2013-11-28 ENCOUNTER — Ambulatory Visit: Payer: Medicare HMO | Admitting: Physical Therapy

## 2013-11-28 DIAGNOSIS — R209 Unspecified disturbances of skin sensation: Secondary | ICD-10-CM

## 2013-11-28 DIAGNOSIS — I059 Rheumatic mitral valve disease, unspecified: Secondary | ICD-10-CM

## 2013-11-28 DIAGNOSIS — G459 Transient cerebral ischemic attack, unspecified: Secondary | ICD-10-CM | POA: Insufficient documentation

## 2013-11-28 DIAGNOSIS — I25119 Atherosclerotic heart disease of native coronary artery with unspecified angina pectoris: Secondary | ICD-10-CM

## 2013-11-28 NOTE — Telephone Encounter (Signed)
  I called patient. The 2-D echocardiogram was relatively unremarkable. No source of cardiac embolus is seen. I discussed this with patient. We will do annual carotid Doppler studies to followup for the left internal carotid artery stenosis.   2-D echocardiogram 11/27/2013:  Study Conclusions  - Left ventricle: The cavity size was normal. Systolic function was normal. Wall motion was normal; there were no regional wall motion abnormalities. Doppler parameters are consistent with abnormal left ventricular relaxation (grade 1 diastolic dysfunction). - Mitral valve: Calcified annulus. There was mild regurgitation.  Impressions:  - No cardiac source of emboli was indentified.

## 2013-11-28 NOTE — Progress Notes (Signed)
2D Echocardiogram Complete.  11/28/2013   Bethany McMahill, RDCS

## 2013-11-30 ENCOUNTER — Encounter: Payer: Medicare HMO | Admitting: Physical Therapy

## 2013-12-01 ENCOUNTER — Ambulatory Visit: Payer: Medicare HMO | Admitting: Physical Therapy

## 2013-12-07 ENCOUNTER — Ambulatory Visit: Payer: Medicare HMO | Attending: Family Medicine

## 2013-12-07 DIAGNOSIS — M545 Low back pain, unspecified: Secondary | ICD-10-CM | POA: Diagnosis not present

## 2013-12-07 DIAGNOSIS — IMO0001 Reserved for inherently not codable concepts without codable children: Secondary | ICD-10-CM | POA: Insufficient documentation

## 2013-12-07 DIAGNOSIS — R5381 Other malaise: Secondary | ICD-10-CM | POA: Diagnosis not present

## 2013-12-07 DIAGNOSIS — M259 Joint disorder, unspecified: Secondary | ICD-10-CM | POA: Diagnosis not present

## 2013-12-15 ENCOUNTER — Encounter: Payer: Self-pay | Admitting: Cardiovascular Disease

## 2013-12-15 ENCOUNTER — Ambulatory Visit (INDEPENDENT_AMBULATORY_CARE_PROVIDER_SITE_OTHER): Payer: Medicare HMO | Admitting: Cardiovascular Disease

## 2013-12-15 VITALS — BP 156/84 | HR 63 | Ht 66.0 in | Wt 145.4 lb

## 2013-12-15 DIAGNOSIS — R002 Palpitations: Secondary | ICD-10-CM

## 2013-12-15 DIAGNOSIS — I10 Essential (primary) hypertension: Secondary | ICD-10-CM

## 2013-12-15 DIAGNOSIS — G459 Transient cerebral ischemic attack, unspecified: Secondary | ICD-10-CM

## 2013-12-15 NOTE — Assessment & Plan Note (Signed)
History of CAD status post stenting of her LAD in [place] clinic in 2008 with a recent negative Myoview. She denies chest pain or shortness of breath.

## 2013-12-15 NOTE — Progress Notes (Signed)
12/15/2013 Kendra [name]   09-Mar-1936  [id number]  Primary Physician Anthoney Harada, MD Primary Cardiologist: Lorretta Harp MD Renae Gloss   HPI:  Kendra [name] is a 78 year old mildly overweight married Caucasian female mother of one daughter who recently relocated within the last year or 2 from [place] to be [place] to be closer to her daughter. She self-referred to be established in our practice for ongoing cardiac care. She had previously seen Dr. Candee [name]. Her risk factors for heart disease are remarkable for treated hypertension, hyper-lipidemia and family history. Her mother did have 2 myocardial infarctions. She has never smoked. She had 2 overlapping stents placed in her LAD in [facility] March 08 has been asystematic since. She had negative Myoview one year ago.I saw her late last year and she has been doing well since. She was recently evaluated for TIA with a negative 2-D echo and carotid Doppler study.    Current Outpatient Prescriptions  Medication Sig Dispense Refill  . albuterol (PROVENTIL HFA;VENTOLIN HFA) 108 (90 BASE) MCG/ACT inhaler Inhale 1-2 puffs into the lungs every 6 (six) hours as needed for wheezing or shortness of breath.      [name] amLODipine (NORVASC) 5 MG tablet Take 1.25 mg by mouth daily. 1/4 tablet daily      . beclomethasone (QVAR) 80 MCG/ACT inhaler Inhale 2 puffs into the lungs 2 (two) times daily.      [name] BIOTIN FORTE PO Take 1 capsule by mouth daily.      . cholecalciferol (VITAMIN D) 1000 UNITS tablet Take 1,000 Units by mouth daily.      . clobetasol (OLUX) 0.05 % topical foam Apply 1 application topically every 3 (three) days.      . cloNIDine (CATAPRES) 0.1 MG tablet Take 0.1 mg by mouth daily as needed (high BP > 170).       . Coenzyme Q10 (CO Q-10) 100 MG CAPS Take 100 mg by mouth daily.       . Cranberry-Vitamin C-Probiotic (AZO CRANBERRY) 250-30 MG TABS Take 250 mg by mouth daily.      [name]  ECHINACEA-ZINC-VITAMIN C PO Take 1 tablet by mouth daily.      . Glucosamine-Chondroit-Vit C-Mn (GLUCOSAMINE CHONDR 1500 COMPLX PO) Take 1 tablet by mouth daily.      . hydrALAZINE (APRESOLINE) 25 MG tablet Take 12.5 mg by mouth 2 (two) times daily.       . hydrocortisone 2.5 % cream Apply 1 application topically 2 (two) times daily as needed (skin irritation).       [name] losartan (COZAAR) 50 MG tablet Take 50 mg by mouth 2 (two) times daily.      . metaxalone (SKELAXIN) 800 MG tablet Take 400 mg by mouth 4 (four) times daily as needed for muscle spasms.       . Multiple Vitamin (MULTIVITAMIN) capsule Take 1 capsule by mouth daily.      . mupirocin ointment (BACTROBAN) 2 % Apply 1 application topically as needed.      . rosuvastatin (CRESTOR) 5 MG tablet Take 5 mg by mouth every other day.        No current facility-administered medications for this visit.    Allergies  Allergen Reactions  . Statins     Unable to tolerate statins w the exception of crestor.  . Sulfa Antibiotics Other (See Comments)    "didnt feel good"  . Zetia [Ezetimibe] Other (See Comments)    "makes me constipated"  .  Penicillins Rash    History   Social History  . Marital Status: Married    Spouse Name: N/A    Number of Children: 1  . Years of Education: MAx2   Occupational History  . Retired   . Social Worker    Social History Main Topics  . Smoking status: Former Smoker    Quit date: 06/08/1966  . Smokeless tobacco: Never Used  . Alcohol Use: 0.5 oz/week    1 drink(s) per week     Comment: wine  . Drug Use: No  . Sexual Activity: Not on file   Other Topics Concern  . Not on file   Social History Narrative  . No narrative on file     Review of Systems: General: negative for chills, fever, night sweats or weight changes.  Cardiovascular: negative for chest pain, dyspnea on exertion, edema, orthopnea, palpitations, paroxysmal nocturnal dyspnea or shortness of breath Dermatological: negative  for rash Respiratory: negative for cough or wheezing Urologic: negative for hematuria Abdominal: negative for nausea, vomiting, diarrhea, bright red blood per rectum, melena, or hematemesis Neurologic: negative for visual changes, syncope, or dizziness All other systems reviewed and are otherwise negative except as noted above.    Blood pressure 156/84, pulse 63, height 5\' 6"  (1.676 m), weight 145 lb 6.4 oz (65.953 kg).  General appearance: alert and no distress Neck: no adenopathy, no carotid bruit, no JVD, supple, symmetrical, trachea midline and thyroid not enlarged, symmetric, no tenderness/mass/nodules Lungs: clear to auscultation bilaterally Heart: regular rate and rhythm, S1, S2 normal, no murmur, click, rub or gallop Extremities: extremities normal, atraumatic, no cyanosis or edema  EKG not performed today  ASSESSMENT AND PLAN:   Coronary artery disease History of CAD status post stenting of her LAD in [place] clinic in 2008 with a recent negative Myoview. She denies chest pain or shortness of breath.  Essential hypertension Controlled on current medications  Hyperlipidemia On statin therapy with recent lipid profile performed 11/13/13 revealed a total cholesterol 151, LDL 79 and HDL of 60.      Lorretta Harp MD FACP,FACC,FAHA, [facility] 12/15/2013 2:50 PM

## 2013-12-15 NOTE — Patient Instructions (Signed)
  We will see you back in follow up in 1 year with Dr Gwenlyn Found.   Dr Gwenlyn Found has ordered: 1.  Event monitor. Event monitors are medical devices that record the heart's electrical activity. Doctors most often [place] these monitors to diagnose arrhythmias. Arrhythmias are problems with the speed or rhythm of the heartbeat. The monitor is a small, portable device. You can wear one while you do your normal daily activities. This is usually used to diagnose what is causing palpitations/syncope (passing out).

## 2013-12-15 NOTE — Assessment & Plan Note (Signed)
On statin therapy with recent lipid profile performed 11/13/13 revealed a total cholesterol 151, LDL 79 and HDL of 60.

## 2013-12-15 NOTE — Assessment & Plan Note (Signed)
Controlled on current medications 

## 2014-01-01 ENCOUNTER — Other Ambulatory Visit: Payer: Self-pay

## 2014-01-01 ENCOUNTER — Telehealth: Payer: Self-pay | Admitting: Cardiovascular Disease

## 2014-01-01 NOTE — Telephone Encounter (Signed)
Returned call to [employer] with [place].She stated order for 30 day event monitor was entered for 3 days instead of 30 days.Stated we need to place a new order for 27 days and they will mail patient a new monitor.Patient called and explained what happened.Patient stated she has had problems with [employer].Stated she has called them several times about electrodes irritating skin.Stated each time someone tells her something different.Stated she did receive new electrodes.Stated she did remove monitor for 24 hours due to hanging monitor around her neck is aggravating her degenerative arthritis.Advised to place monitor in her pocket.Advised to place maalox on her skin where a electrode is to be placed,let maalox dry then place electrode on top of dry maalox skin.Advised that will help with skin irritation.Patient stated she placed monitor on 12/21/13 and will mail back 01/17/14.Stated she is going out state and does not want to take monitor.

## 2014-01-01 NOTE — Telephone Encounter (Signed)
Kendra [name] was ordered to have a 30 day event monitor.  Somehow the "0" was dropped and the patient was only on the monitor for 3 days.  If Dr. Gwenlyn Found wants the patient to have the monitor for the remaining 27 days, she will have to be registered again.  When you call [employer] back, you can speak with anyone that answers.

## 2014-01-09 ENCOUNTER — Telehealth: Payer: Self-pay | Admitting: Cardiovascular Disease

## 2014-01-09 NOTE — Telephone Encounter (Signed)
forwarded to Kendra [name]  as Juluis Rainier per patient

## 2014-01-09 NOTE — Telephone Encounter (Signed)
Pt have not been able to wear the heart monitor for 4 days,because [employer] have sent her the wrong electrodes she wanted you to know [employer] have messed up several times .She have been out of the electrodes since Saturday.

## 2014-01-10 ENCOUNTER — Other Ambulatory Visit: Payer: Self-pay | Admitting: Family Medicine

## 2014-01-10 DIAGNOSIS — Z803 Family history of malignant neoplasm of breast: Secondary | ICD-10-CM

## 2014-01-10 DIAGNOSIS — N6459 Other signs and symptoms in breast: Secondary | ICD-10-CM

## 2014-01-10 DIAGNOSIS — R234 Changes in skin texture: Secondary | ICD-10-CM

## 2014-01-15 ENCOUNTER — Telehealth: Payer: Self-pay | Admitting: *Deleted

## 2014-01-15 NOTE — Telephone Encounter (Signed)
Patient notified of event monitor results - NSR/SB - # of monitored days was 2 on EOS report  Patient stated that her monitor was only registered for 3 days originally and she had to get a new monitor from [place], at which time they sent her an older version that was too bulky and the wrong stickers. She says she can wear the monitor until Thursday of this week but on Friday she is leaving for [place], [place] for 1 week and will not take monitor with her.   Just FYI - there will be another EOS summary for the second monitor she had registered.

## 2014-01-16 ENCOUNTER — Ambulatory Visit
Admission: RE | Admit: 2014-01-16 | Discharge: 2014-01-16 | Disposition: A | Discharge status: Home / Self Care | Payer: Medicare HMO | Source: Ambulatory Visit | Attending: Family Medicine | Admitting: Family Medicine

## 2014-01-16 ENCOUNTER — Other Ambulatory Visit: Payer: Medicare HMO

## 2014-01-16 DIAGNOSIS — Z803 Family history of malignant neoplasm of breast: Secondary | ICD-10-CM

## 2014-01-16 DIAGNOSIS — R234 Changes in skin texture: Secondary | ICD-10-CM

## 2014-01-16 DIAGNOSIS — N6459 Other signs and symptoms in breast: Secondary | ICD-10-CM

## 2014-02-02 ENCOUNTER — Encounter: Payer: Self-pay | Admitting: Cardiovascular Disease

## 2014-02-22 ENCOUNTER — Other Ambulatory Visit (HOSPITAL_COMMUNITY): Payer: Self-pay | Admitting: Family Medicine

## 2014-02-22 DIAGNOSIS — R55 Syncope and collapse: Secondary | ICD-10-CM

## 2014-02-23 ENCOUNTER — Other Ambulatory Visit: Payer: Self-pay | Admitting: Family Medicine

## 2014-02-23 DIAGNOSIS — R55 Syncope and collapse: Secondary | ICD-10-CM

## 2014-02-26 ENCOUNTER — Ambulatory Visit
Admission: RE | Admit: 2014-02-26 | Discharge: 2014-02-26 | Disposition: A | Discharge status: Home / Self Care | Payer: Medicare HMO | Source: Ambulatory Visit | Attending: Family Medicine | Admitting: Family Medicine

## 2014-02-26 DIAGNOSIS — R55 Syncope and collapse: Secondary | ICD-10-CM

## 2014-08-02 ENCOUNTER — Other Ambulatory Visit: Payer: Self-pay | Admitting: Family Medicine

## 2014-08-02 DIAGNOSIS — I10 Essential (primary) hypertension: Secondary | ICD-10-CM | POA: Diagnosis not present

## 2014-08-02 DIAGNOSIS — Z789 Other specified health status: Secondary | ICD-10-CM | POA: Diagnosis not present

## 2014-08-02 DIAGNOSIS — G458 Other transient cerebral ischemic attacks and related syndromes: Secondary | ICD-10-CM

## 2014-08-02 DIAGNOSIS — I6522 Occlusion and stenosis of left carotid artery: Secondary | ICD-10-CM

## 2014-08-02 DIAGNOSIS — I2510 Atherosclerotic heart disease of native coronary artery without angina pectoris: Secondary | ICD-10-CM | POA: Diagnosis not present

## 2014-08-02 DIAGNOSIS — H9190 Unspecified hearing loss, unspecified ear: Secondary | ICD-10-CM | POA: Diagnosis not present

## 2014-08-02 DIAGNOSIS — E782 Mixed hyperlipidemia: Secondary | ICD-10-CM | POA: Diagnosis not present

## 2014-08-02 DIAGNOSIS — M859 Disorder of bone density and structure, unspecified: Secondary | ICD-10-CM | POA: Diagnosis not present

## 2014-08-02 DIAGNOSIS — G459 Transient cerebral ischemic attack, unspecified: Secondary | ICD-10-CM | POA: Diagnosis not present

## 2014-08-02 DIAGNOSIS — M542 Cervicalgia: Secondary | ICD-10-CM | POA: Diagnosis not present

## 2014-08-02 DIAGNOSIS — R1013 Epigastric pain: Secondary | ICD-10-CM | POA: Diagnosis not present

## 2014-08-07 ENCOUNTER — Ambulatory Visit
Admission: RE | Admit: 2014-08-07 | Discharge: 2014-08-07 | Disposition: A | Discharge status: Home / Self Care | Payer: Medicare HMO | Source: Ambulatory Visit | Attending: Family Medicine | Admitting: Family Medicine

## 2014-08-07 ENCOUNTER — Other Ambulatory Visit: Payer: Self-pay | Admitting: Family Medicine

## 2014-08-07 DIAGNOSIS — M5030 Other cervical disc degeneration, unspecified cervical region: Secondary | ICD-10-CM | POA: Diagnosis not present

## 2014-08-07 DIAGNOSIS — I6523 Occlusion and stenosis of bilateral carotid arteries: Secondary | ICD-10-CM | POA: Diagnosis not present

## 2014-08-07 DIAGNOSIS — G458 Other transient cerebral ischemic attacks and related syndromes: Secondary | ICD-10-CM

## 2014-08-07 DIAGNOSIS — M542 Cervicalgia: Secondary | ICD-10-CM

## 2014-08-07 DIAGNOSIS — I6522 Occlusion and stenosis of left carotid artery: Secondary | ICD-10-CM

## 2014-08-07 DIAGNOSIS — M47812 Spondylosis without myelopathy or radiculopathy, cervical region: Secondary | ICD-10-CM | POA: Diagnosis not present

## 2014-09-14 ENCOUNTER — Ambulatory Visit (INDEPENDENT_AMBULATORY_CARE_PROVIDER_SITE_OTHER): Payer: Medicare Other | Admitting: Podiatry

## 2014-09-14 ENCOUNTER — Encounter: Payer: Self-pay | Admitting: Podiatry

## 2014-09-14 ENCOUNTER — Ambulatory Visit (INDEPENDENT_AMBULATORY_CARE_PROVIDER_SITE_OTHER): Payer: Medicare Other

## 2014-09-14 DIAGNOSIS — B351 Tinea unguium: Secondary | ICD-10-CM | POA: Diagnosis not present

## 2014-09-14 DIAGNOSIS — M79672 Pain in left foot: Secondary | ICD-10-CM

## 2014-09-14 DIAGNOSIS — I6522 Occlusion and stenosis of left carotid artery: Secondary | ICD-10-CM

## 2014-09-14 DIAGNOSIS — M79676 Pain in unspecified toe(s): Secondary | ICD-10-CM

## 2014-09-14 DIAGNOSIS — M2040 Other hammer toe(s) (acquired), unspecified foot: Secondary | ICD-10-CM

## 2014-09-14 NOTE — Patient Instructions (Signed)
Over the counter medication is called fungi-nail for toenail fungus  

## 2014-09-17 NOTE — Progress Notes (Signed)
Subjective:     Patient ID: Kendra [name], female   DOB: 04-12-1936, 79 y.o.   MRN: [id number]  HPI 79 her female presents the office they with multiple complaints. She states that she is concerned that she may have toenail fungus of the nails are discolored and thickened. She states that she has some pain to the toenails continue his shoe gear and begin elongated causing irritation. She has a states that her toes have started to curl under as well. Lastly, she states that she has some pain at times to the left foot although she currently denies any pain or swelling or any associated redness. Denies any history of injury or trauma. No other complaints at this time.  Review of Systems  All other systems reviewed and are negative.      Objective:   Physical Exam AAO 3, NAD DP/PT pulses palpable, CRT less than 3 seconds Protective sensation intact with Derrel Nip Monofilament, vibratory sensation intact, Achilles tendon reflex intact. Nails are slightly hypertrophic, dystrophic, discolored, elongated, brittle 10. There is no swelling erythema or drainage from the nail sites. There subjective tenderness on nails 1-5 bilaterally. There is mild hammertoe contractures present bilateral lesser digits. There is no tenderness associated with the toes. At this time there is no areas of pinpoint bony tenderness or pain with vibratory sensation to bilateral foot or ankle. There is no overlying edema, erythema, increased warmth bilaterally. MMT 5/5, ROM WNL No open lesions or pre-ulcer lesions identified bilaterally. No pain with calf compression, swelling, warmth, erythema.    Assessment:     79 year old female with symptomatic onychomycosis, hammertoe contractures    Plan:     -X rays were obtained and reviewed the left foot. -Treatment options were discussed including alternatives, risks, competitions. -Discussed treatment options for likely onychomycosis. This time she would like to start  with over-the-counter treatment. I recommended fungi nail. Nails are sharply debrided 10 without complication/bleeding. -Discussed likely etiology of hammertoes. Currently there is symptomatic. Continue to monitor. Discussed areas offloading pads of the areas are symptomatic. -Currently there is no pain to the left foot. We'll continue to monitor. -Follow-up as needed. In the meantime encouraged to call the office with any questions, concerns, change in symptoms.

## 2014-09-19 ENCOUNTER — Encounter: Payer: Self-pay | Admitting: Cardiovascular Disease

## 2014-09-19 ENCOUNTER — Ambulatory Visit (INDEPENDENT_AMBULATORY_CARE_PROVIDER_SITE_OTHER): Payer: Medicare Other | Admitting: Cardiovascular Disease

## 2014-09-19 VITALS — BP 142/56 | HR 54 | Ht 65.5 in | Wt 135.9 lb

## 2014-09-19 DIAGNOSIS — I2510 Atherosclerotic heart disease of native coronary artery without angina pectoris: Secondary | ICD-10-CM | POA: Diagnosis not present

## 2014-09-19 DIAGNOSIS — I10 Essential (primary) hypertension: Secondary | ICD-10-CM | POA: Diagnosis not present

## 2014-09-19 DIAGNOSIS — I739 Peripheral vascular disease, unspecified: Secondary | ICD-10-CM

## 2014-09-19 DIAGNOSIS — I6522 Occlusion and stenosis of left carotid artery: Secondary | ICD-10-CM

## 2014-09-19 DIAGNOSIS — E785 Hyperlipidemia, unspecified: Secondary | ICD-10-CM | POA: Diagnosis not present

## 2014-09-19 DIAGNOSIS — I2583 Coronary atherosclerosis due to lipid rich plaque: Secondary | ICD-10-CM

## 2014-09-19 DIAGNOSIS — I779 Disorder of arteries and arterioles, unspecified: Secondary | ICD-10-CM | POA: Insufficient documentation

## 2014-09-19 NOTE — Assessment & Plan Note (Signed)
History of hypertension with blood pressure measured at 142/56. She does keep a fairly extensive blood pressure diary taking readings up to 3 times a day. She is on clonidine when necessary, amlodipine, losartan and hydralazine. Continue current meds at current dosing

## 2014-09-19 NOTE — Assessment & Plan Note (Signed)
History of hyperlipidemia on Crestor 5 mg a day with recent lipid profile performed by her PCP 08/02/14 revealed a total cholesterol 174, LDL of 90 and HDL of 68. She has become nearly a vegan and her dietary preference.

## 2014-09-19 NOTE — Patient Instructions (Signed)
Your physician recommends that you schedule a follow-up appointment in: October  Your physician has requested that you have a carotid duplex. This test is an ultrasound of the carotid arteries in your neck. It looks at blood flow through these arteries that supply the brain with blood. Allow one hour for this exam. There are no restrictions or special instructions. In September

## 2014-09-19 NOTE — Progress Notes (Signed)
09/19/2014 Kendra [name]   1935-12-11  [id number]  Primary Physician Kendra Harada, MD Primary Cardiologist: Kendra Harp MD Kendra [name]   HPI:   Kendra [name] is a 79 year old mildly overweight married Caucasian female mother of one daughter who recently relocated within the last year or 2 from [place] to be [place] to be closer to her daughter. She self-referred to be established in our practice for ongoing cardiac care. She had previously seen Dr. Candee [name]. I last saw her in the office 12/15/13. Her risk factors for heart disease are remarkable for treated hypertension, hyper-lipidemia and family history. Her mother did have 2 myocardial infarctions. She has never smoked. She had 2 overlapping stents placed in her LAD in [facility] clinic March 08 has been asymptomatic since. She had negative Myoview one year ago. She has had carotid Dopplers last year and again this year which show mild to moderate left internal carotid artery stenosis   Current Outpatient Prescriptions  Medication Sig Dispense Refill  . albuterol (PROVENTIL HFA;VENTOLIN HFA) 108 (90 BASE) MCG/ACT inhaler Inhale 1-2 puffs into the lungs every 6 (six) hours as needed for wheezing or shortness of breath.    [name] amLODipine (NORVASC) 5 MG tablet Take 1.25 mg by mouth daily. 1/4 tablet daily    . beclomethasone (QVAR) 80 MCG/ACT inhaler Inhale 2 puffs into the lungs 2 (two) times daily.    [name] BIOTIN FORTE PO Take 1 capsule by mouth daily.    . cholecalciferol (VITAMIN D) 1000 UNITS tablet Take 1,000 Units by mouth daily.    . clobetasol (OLUX) 0.05 % topical foam Apply 1 application topically every 3 (three) days.    . cloNIDine (CATAPRES) 0.1 MG tablet Take 0.1 mg by mouth daily as needed (high BP > 170).     . Coenzyme Q10 (CO Q-10) 100 MG CAPS Take 100 mg by mouth daily.     . Cranberry-Vitamin C-Probiotic (AZO CRANBERRY) 250-30 MG TABS Take 250 mg by mouth daily.    [name]  ECHINACEA-ZINC-VITAMIN C PO Take 1 tablet by mouth daily.    . Glucosamine-Chondroit-Vit C-Mn (GLUCOSAMINE CHONDR 1500 COMPLX PO) Take 1 tablet by mouth daily.    . hydrALAZINE (APRESOLINE) 25 MG tablet Take 12.5 mg by mouth 2 (two) times daily.     . hydrocortisone 2.5 % cream Apply 1 application topically 2 (two) times daily as needed (skin irritation).     [name] losartan (COZAAR) 50 MG tablet Take 50 mg by mouth 2 (two) times daily.    . metaxalone (SKELAXIN) 800 MG tablet Take 400 mg by mouth 4 (four) times daily as needed for muscle spasms.     . Multiple Vitamin (MULTIVITAMIN) capsule Take 1 capsule by mouth daily.    . rosuvastatin (CRESTOR) 5 MG tablet Take 5 mg by mouth every other day.      No current facility-administered medications for this visit.    Allergies  Allergen Reactions  . Statins     Unable to tolerate statins w the exception of crestor.  . Sulfa Antibiotics Other (See Comments)    "didnt feel good"  . Zetia [Ezetimibe] Other (See Comments)    "makes me constipated"  . Penicillins Rash    History   Social History  . Marital Status: Married    Spouse Name: N/A  . Number of Children: 1  . Years of Education: MAx2   Occupational History  . Retired   . Education officer, museum  Social History Main Topics  . Smoking status: Former Smoker    Quit date: 06/08/1966  . Smokeless tobacco: Never Used  . Alcohol Use: 0.5 oz/week    1 drink(s) per week     Comment: wine  . Drug Use: No  . Sexual Activity: Not on file   Other Topics Concern  . Not on file   Social History Narrative     Review of Systems: General: negative for chills, fever, night sweats or weight changes.  Cardiovascular: negative for chest pain, dyspnea on exertion, edema, orthopnea, palpitations, paroxysmal nocturnal dyspnea or shortness of breath Dermatological: negative for rash Respiratory: negative for cough or wheezing Urologic: negative for hematuria Abdominal: negative for nausea,  vomiting, diarrhea, bright red blood per rectum, melena, or hematemesis Neurologic: negative for visual changes, syncope, or dizziness All other systems reviewed and are otherwise negative except as noted above.    Blood pressure 142/56, pulse 54, height 5' 5.5" (1.664 m), weight 135 lb 14.4 oz (61.644 kg).  General appearance: alert and no distress Neck: no adenopathy, no carotid bruit, no JVD, supple, symmetrical, trachea midline and thyroid not enlarged, symmetric, no tenderness/mass/nodules Lungs: clear to auscultation bilaterally Heart: regular rate and rhythm, S1, S2 normal, no murmur, click, rub or gallop Extremities: extremities normal, atraumatic, no cyanosis or edema  EKG sinus bradycardia at 54 without ST or T-wave changes. I personally reviewed this EKG  ASSESSMENT AND PLAN:   Hyperlipidemia History of hyperlipidemia on Crestor 5 mg a day with recent lipid profile performed by her PCP 08/02/14 revealed a total cholesterol 174, LDL of 90 and HDL of 68. She has become nearly a vegan and her dietary preference.   Essential hypertension History of hypertension with blood pressure measured at 142/56. She does keep a fairly extensive blood pressure diary taking readings up to 3 times a day. She is on clonidine when necessary, amlodipine, losartan and hydralazine. Continue current meds at current dosing   Coronary artery disease History of CAD status post stenting of her mid LAD in [place] clinic in 2008 with a Myoview that was nonischemic. She is asymptomatic   Carotid artery disease History of asymptomatic moderate left internal carotid artery stenosis.       Kendra Harp MD FACP,FACC,FAHA, [facility] 09/19/2014 3:51 PM

## 2014-09-19 NOTE — Assessment & Plan Note (Signed)
History of asymptomatic moderate left internal carotid artery stenosis.

## 2014-09-19 NOTE — Assessment & Plan Note (Signed)
History of CAD status post stenting of her mid LAD in [place] clinic in 2008 with a Myoview that was nonischemic. She is asymptomatic

## 2014-09-20 ENCOUNTER — Encounter: Payer: Self-pay | Admitting: Cardiovascular Disease

## 2014-10-24 DIAGNOSIS — R1013 Epigastric pain: Secondary | ICD-10-CM | POA: Diagnosis not present

## 2014-10-24 DIAGNOSIS — I10 Essential (primary) hypertension: Secondary | ICD-10-CM | POA: Diagnosis not present

## 2014-10-24 DIAGNOSIS — I2510 Atherosclerotic heart disease of native coronary artery without angina pectoris: Secondary | ICD-10-CM | POA: Diagnosis not present

## 2014-10-24 DIAGNOSIS — Z789 Other specified health status: Secondary | ICD-10-CM | POA: Diagnosis not present

## 2014-10-24 DIAGNOSIS — I6522 Occlusion and stenosis of left carotid artery: Secondary | ICD-10-CM | POA: Diagnosis not present

## 2014-10-24 DIAGNOSIS — R739 Hyperglycemia, unspecified: Secondary | ICD-10-CM | POA: Diagnosis not present

## 2014-10-24 DIAGNOSIS — G459 Transient cerebral ischemic attack, unspecified: Secondary | ICD-10-CM | POA: Diagnosis not present

## 2014-10-24 DIAGNOSIS — H9190 Unspecified hearing loss, unspecified ear: Secondary | ICD-10-CM | POA: Diagnosis not present

## 2014-10-24 DIAGNOSIS — M859 Disorder of bone density and structure, unspecified: Secondary | ICD-10-CM | POA: Diagnosis not present

## 2014-10-24 DIAGNOSIS — M21221 Flexion deformity, right elbow: Secondary | ICD-10-CM | POA: Diagnosis not present

## 2014-11-12 DIAGNOSIS — M25621 Stiffness of right elbow, not elsewhere classified: Secondary | ICD-10-CM | POA: Diagnosis not present

## 2014-11-12 DIAGNOSIS — M19021 Primary osteoarthritis, right elbow: Secondary | ICD-10-CM | POA: Diagnosis not present

## 2014-11-12 DIAGNOSIS — M25521 Pain in right elbow: Secondary | ICD-10-CM | POA: Diagnosis not present

## 2014-11-21 DIAGNOSIS — S80819A Abrasion, unspecified lower leg, initial encounter: Secondary | ICD-10-CM | POA: Diagnosis not present

## 2014-11-21 DIAGNOSIS — W5501XA Bitten by cat, initial encounter: Secondary | ICD-10-CM | POA: Diagnosis not present

## 2014-11-22 DIAGNOSIS — M25621 Stiffness of right elbow, not elsewhere classified: Secondary | ICD-10-CM | POA: Diagnosis not present

## 2014-11-29 DIAGNOSIS — Z1211 Encounter for screening for malignant neoplasm of colon: Secondary | ICD-10-CM | POA: Diagnosis not present

## 2014-11-30 DIAGNOSIS — M25621 Stiffness of right elbow, not elsewhere classified: Secondary | ICD-10-CM | POA: Diagnosis not present

## 2014-12-04 DIAGNOSIS — M25621 Stiffness of right elbow, not elsewhere classified: Secondary | ICD-10-CM | POA: Diagnosis not present

## 2014-12-07 DIAGNOSIS — M25621 Stiffness of right elbow, not elsewhere classified: Secondary | ICD-10-CM | POA: Diagnosis not present

## 2014-12-12 DIAGNOSIS — H40012 Open angle with borderline findings, low risk, left eye: Secondary | ICD-10-CM | POA: Diagnosis not present

## 2014-12-12 DIAGNOSIS — H401212 Low-tension glaucoma, right eye, moderate stage: Secondary | ICD-10-CM | POA: Diagnosis not present

## 2014-12-12 DIAGNOSIS — H35363 Drusen (degenerative) of macula, bilateral: Secondary | ICD-10-CM | POA: Diagnosis not present

## 2014-12-12 DIAGNOSIS — H35039 Hypertensive retinopathy, unspecified eye: Secondary | ICD-10-CM | POA: Diagnosis not present

## 2014-12-13 DIAGNOSIS — R21 Rash and other nonspecific skin eruption: Secondary | ICD-10-CM | POA: Diagnosis not present

## 2014-12-14 DIAGNOSIS — M25621 Stiffness of right elbow, not elsewhere classified: Secondary | ICD-10-CM | POA: Diagnosis not present

## 2014-12-17 DIAGNOSIS — M25521 Pain in right elbow: Secondary | ICD-10-CM | POA: Diagnosis not present

## 2014-12-17 DIAGNOSIS — M19021 Primary osteoarthritis, right elbow: Secondary | ICD-10-CM | POA: Diagnosis not present

## 2014-12-17 DIAGNOSIS — M25621 Stiffness of right elbow, not elsewhere classified: Secondary | ICD-10-CM | POA: Diagnosis not present

## 2014-12-18 ENCOUNTER — Other Ambulatory Visit: Payer: Self-pay

## 2014-12-18 DIAGNOSIS — Z1231 Encounter for screening mammogram for malignant neoplasm of breast: Secondary | ICD-10-CM

## 2014-12-19 DIAGNOSIS — M25621 Stiffness of right elbow, not elsewhere classified: Secondary | ICD-10-CM | POA: Diagnosis not present

## 2014-12-21 DIAGNOSIS — M25621 Stiffness of right elbow, not elsewhere classified: Secondary | ICD-10-CM | POA: Diagnosis not present

## 2015-01-09 DIAGNOSIS — M25621 Stiffness of right elbow, not elsewhere classified: Secondary | ICD-10-CM | POA: Diagnosis not present

## 2015-01-11 DIAGNOSIS — M25621 Stiffness of right elbow, not elsewhere classified: Secondary | ICD-10-CM | POA: Diagnosis not present

## 2015-01-15 DIAGNOSIS — M25621 Stiffness of right elbow, not elsewhere classified: Secondary | ICD-10-CM | POA: Diagnosis not present

## 2015-01-16 DIAGNOSIS — M25521 Pain in right elbow: Secondary | ICD-10-CM | POA: Diagnosis not present

## 2015-01-16 DIAGNOSIS — M25621 Stiffness of right elbow, not elsewhere classified: Secondary | ICD-10-CM | POA: Diagnosis not present

## 2015-01-16 DIAGNOSIS — M19021 Primary osteoarthritis, right elbow: Secondary | ICD-10-CM | POA: Diagnosis not present

## 2015-01-18 DIAGNOSIS — M25621 Stiffness of right elbow, not elsewhere classified: Secondary | ICD-10-CM | POA: Diagnosis not present

## 2015-01-21 ENCOUNTER — Ambulatory Visit
Admission: RE | Admit: 2015-01-21 | Discharge: 2015-01-21 | Disposition: A | Discharge status: Home / Self Care | Payer: Medicare Other | Source: Ambulatory Visit

## 2015-01-21 DIAGNOSIS — Z1231 Encounter for screening mammogram for malignant neoplasm of breast: Secondary | ICD-10-CM | POA: Diagnosis not present

## 2015-01-22 DIAGNOSIS — M25621 Stiffness of right elbow, not elsewhere classified: Secondary | ICD-10-CM | POA: Diagnosis not present

## 2015-01-29 DIAGNOSIS — I2510 Atherosclerotic heart disease of native coronary artery without angina pectoris: Secondary | ICD-10-CM | POA: Diagnosis not present

## 2015-01-29 DIAGNOSIS — I10 Essential (primary) hypertension: Secondary | ICD-10-CM | POA: Diagnosis not present

## 2015-01-29 DIAGNOSIS — I6522 Occlusion and stenosis of left carotid artery: Secondary | ICD-10-CM | POA: Diagnosis not present

## 2015-01-29 DIAGNOSIS — M25621 Stiffness of right elbow, not elsewhere classified: Secondary | ICD-10-CM | POA: Diagnosis not present

## 2015-01-29 DIAGNOSIS — E782 Mixed hyperlipidemia: Secondary | ICD-10-CM | POA: Diagnosis not present

## 2015-01-29 DIAGNOSIS — H9190 Unspecified hearing loss, unspecified ear: Secondary | ICD-10-CM | POA: Diagnosis not present

## 2015-01-29 DIAGNOSIS — M859 Disorder of bone density and structure, unspecified: Secondary | ICD-10-CM | POA: Diagnosis not present

## 2015-02-07 DIAGNOSIS — M25621 Stiffness of right elbow, not elsewhere classified: Secondary | ICD-10-CM | POA: Diagnosis not present

## 2015-02-14 DIAGNOSIS — M25621 Stiffness of right elbow, not elsewhere classified: Secondary | ICD-10-CM | POA: Diagnosis not present

## 2015-02-21 DIAGNOSIS — M25621 Stiffness of right elbow, not elsewhere classified: Secondary | ICD-10-CM | POA: Diagnosis not present

## 2015-02-28 DIAGNOSIS — M25521 Pain in right elbow: Secondary | ICD-10-CM | POA: Diagnosis not present

## 2015-02-28 DIAGNOSIS — M19021 Primary osteoarthritis, right elbow: Secondary | ICD-10-CM | POA: Diagnosis not present

## 2015-03-15 ENCOUNTER — Encounter: Payer: Self-pay | Admitting: Cardiovascular Disease

## 2015-03-15 ENCOUNTER — Ambulatory Visit (INDEPENDENT_AMBULATORY_CARE_PROVIDER_SITE_OTHER): Payer: Medicare Other | Admitting: Cardiovascular Disease

## 2015-03-15 VITALS — BP 140/84 | HR 57 | Ht 65.0 in | Wt 127.0 lb

## 2015-03-15 DIAGNOSIS — I2583 Coronary atherosclerosis due to lipid rich plaque: Secondary | ICD-10-CM

## 2015-03-15 DIAGNOSIS — I739 Peripheral vascular disease, unspecified: Secondary | ICD-10-CM

## 2015-03-15 DIAGNOSIS — I10 Essential (primary) hypertension: Secondary | ICD-10-CM | POA: Diagnosis not present

## 2015-03-15 DIAGNOSIS — I6522 Occlusion and stenosis of left carotid artery: Secondary | ICD-10-CM | POA: Diagnosis not present

## 2015-03-15 DIAGNOSIS — I779 Disorder of arteries and arterioles, unspecified: Secondary | ICD-10-CM

## 2015-03-15 DIAGNOSIS — E785 Hyperlipidemia, unspecified: Secondary | ICD-10-CM

## 2015-03-15 DIAGNOSIS — I2510 Atherosclerotic heart disease of native coronary artery without angina pectoris: Secondary | ICD-10-CM | POA: Diagnosis not present

## 2015-03-15 MED ORDER — HYDRALAZINE HCL 25 MG PO TABS: 25.0000 mg | ORAL_TABLET | Freq: Two times a day (BID) | ORAL | Status: DC

## 2015-03-15 NOTE — Patient Instructions (Addendum)
Medication Instructions:  Your physician has recommended you make the following change in your medication:  1) INCREASE Hydralazine to 25 mg (whole tablet) by mouth TWICE daily  Labwork: I will get your lab work from your [place].   Testing/Procedures: Your physician has requested that you have a carotid duplex. This test is an ultrasound of the carotid arteries in your neck. It looks at blood flow through these arteries that supply the brain with blood. Allow one hour for this exam. There are no restrictions or special instructions.   Follow-Up: Your physician recommends that you schedule a follow-up appointment in: 1 month in [place] wants you to follow-up in: 6 months with Dr. Gwenlyn Found. You will receive a reminder letter in the mail two months in advance. If you don't receive a letter, please call our office to schedule the follow-up appointment.   Any Other Special Instructions Will Be Listed Below (If Applicable).  Dr. Gwenlyn Found would like you to check your blood pressure DAILY for the next 4 weeks.  Keep a journal of these daily BP and heart rate reading and bring to our office on your next scheduled appointment.

## 2015-03-15 NOTE — Assessment & Plan Note (Signed)
History of coronary artery disease status post LAD stenting in the [facility] clinic in March 2008. She has been a symptomatically since and remains on aspirin.

## 2015-03-15 NOTE — Progress Notes (Signed)
03/15/2015 Kendra [name]   [birth year]  [id number]  Primary Physician Kendra Harada, MD Primary Cardiologist: Kendra Harp MD Kendra [name]   HPI:  Kendra [name] is a 79 year old mildly overweight married Caucasian female mother of one daughter who recently relocated within the last year or 2 from [place] to be [place] to be closer to her daughter. I last saw her in the office 09/19/14.She self-referred to be established in our practice for ongoing cardiac care. She had previously seen Dr. Candee [name]. I last saw her in the office 12/15/13. Her risk factors for heart disease are remarkable for treated hypertension, hyper-lipidemia and family history. Her mother did have 2 myocardial infarctions. She has never smoked. She had 2 overlapping stents placed in her LAD in [facility] clinic March 08 has been asymptomatic since. She had negative Myoview one year ago. She has had carotid Dopplers last year and again this year which show mild to moderate left internal carotid artery stenosis. Since I saw her last her only complaints are of nocturnal spikes of her systolic blood pressure up to the 190 range.   Current Outpatient Prescriptions  Medication Sig Dispense Refill  . albuterol (PROVENTIL HFA;VENTOLIN HFA) 108 (90 BASE) MCG/ACT inhaler Inhale 1-2 puffs into the lungs every 6 (six) hours as needed for wheezing or shortness of breath.    [name] amLODipine (NORVASC) 5 MG tablet Take 1.25 mg by mouth daily. 1/4 tablet daily    . beclomethasone (QVAR) 80 MCG/ACT inhaler Inhale 2 puffs into the lungs 2 (two) times daily.    [name] BIOTIN FORTE PO Take 1 capsule by mouth daily.    . cholecalciferol (VITAMIN D) 1000 UNITS tablet Take 1,000 Units by mouth daily.    . clobetasol (OLUX) 0.05 % topical foam Apply 1 application topically every 3 (three) days.    . cloNIDine (CATAPRES) 0.1 MG tablet Take 0.1 mg by mouth daily as needed (high BP > 170).     . Coenzyme Q10 (CO Q-10) 100 MG  CAPS Take 100 mg by mouth daily.     . Cranberry-Vitamin C-Probiotic (AZO CRANBERRY) 250-30 MG TABS Take 250 mg by mouth daily.    [name] ECHINACEA-ZINC-VITAMIN C PO Take 1 tablet by mouth daily.    . Glucosamine-Chondroit-Vit C-Mn (GLUCOSAMINE CHONDR 1500 COMPLX PO) Take 1 tablet by mouth daily.    . hydrALAZINE (APRESOLINE) 25 MG tablet Take 12.5 mg by mouth 2 (two) times daily.     . hydrocortisone 2.5 % cream Apply 1 application topically 2 (two) times daily as needed (skin irritation).     [name] losartan (COZAAR) 50 MG tablet Take 50 mg by mouth 2 (two) times daily.    . metaxalone (SKELAXIN) 800 MG tablet Take 400 mg by mouth 4 (four) times daily as needed for muscle spasms.     . Multiple Vitamin (MULTIVITAMIN) capsule Take 1 capsule by mouth daily.    . rosuvastatin (CRESTOR) 5 MG tablet Take 5 mg by mouth every other day.      No current facility-administered medications for this visit.    Allergies  Allergen Reactions  . Statins     Unable to tolerate statins w the exception of crestor.  . Sulfa Antibiotics Other (See Comments)    "didnt feel good"  . Zetia [Ezetimibe] Other (See Comments)    "makes me constipated"  . Penicillins Rash    Social History   Social History  . Marital Status: Married    Spouse  Name: N/A  . Number of Children: 1  . Years of Education: MAx2   Occupational History  . Retired   . Social Worker    Social History Main Topics  . Smoking status: Former Smoker    Quit date: 06/08/1966  . Smokeless tobacco: Never Used  . Alcohol Use: 0.5 oz/week    1 drink(s) per week     Comment: wine  . Drug Use: No  . Sexual Activity: Not on file   Other Topics Concern  . Not on file   Social History Narrative     Review of Systems: General: negative for chills, fever, night sweats or weight changes.  Cardiovascular: negative for chest pain, dyspnea on exertion, edema, orthopnea, palpitations, paroxysmal nocturnal dyspnea or shortness of  breath Dermatological: negative for rash Respiratory: negative for cough or wheezing Urologic: negative for hematuria Abdominal: negative for nausea, vomiting, diarrhea, bright red blood per rectum, melena, or hematemesis Neurologic: negative for visual changes, syncope, or dizziness All other systems reviewed and are otherwise negative except as noted above.    Blood pressure 140/84, pulse 57, height 5\' 5"  (1.651 m), weight 127 lb (57.607 kg).  General appearance: alert and no distress Neck: no adenopathy, no carotid bruit, no JVD, supple, symmetrical, trachea midline and thyroid not enlarged, symmetric, no tenderness/mass/nodules Lungs: clear to auscultation bilaterally Heart: regular rate and rhythm, S1, S2 normal, no murmur, click, rub or gallop Extremities: extremities normal, atraumatic, no cyanosis or edema  EKG sinus bradycardia 57 without ST or T-wave changes. I personally reviewed this EKG  ASSESSMENT AND PLAN:   Hyperlipidemia History of hyperlipidemia on Crestor 5 mg a day followed by her PCP  Essential hypertension History of hypertension blood pressure measured today at 140/84. She is on amlodipine, clonidine, hydralazine and losartan. She does say that in the evening she has systolic blood pressure spikes in the 1 7190 range. Going to increase her hydralazine from 12/2-25 mg by mouth twice a day. She will keep a blood pressure log over the next 30 days and will see Kendra [name] in the office for follow-up and medicine titration.  Coronary artery disease History of coronary artery disease status post LAD stenting in the [facility] clinic in March 2008. She has been a symptomatically since and remains on aspirin.  Carotid artery disease History of moderate carotid artery disease. We will recheck carotid Doppler studies      Kendra Harp MD [facility], [facility] 03/15/2015 12:21 PM

## 2015-03-15 NOTE — Assessment & Plan Note (Signed)
History of hypertension blood pressure measured today at 140/84. She is on amlodipine, clonidine, hydralazine and losartan. She does say that in the evening she has systolic blood pressure spikes in the 1 7190 range. Going to increase her hydralazine from 12/2-25 mg by mouth twice a day. She will keep a blood pressure log over the next 30 days and will see Erasmo Downer in the office for follow-up and medicine titration.

## 2015-03-15 NOTE — Assessment & Plan Note (Signed)
History of moderate carotid artery disease. We will recheck carotid Doppler studies

## 2015-03-15 NOTE — Assessment & Plan Note (Signed)
History of hyperlipidemia on Crestor 5 mg a day followed by her PCP 

## 2015-03-25 ENCOUNTER — Ambulatory Visit (HOSPITAL_COMMUNITY)
Admission: RE | Admit: 2015-03-25 | Discharge: 2015-03-25 | Disposition: A | Discharge status: Home / Self Care | Payer: Medicare Other | Source: Ambulatory Visit | Attending: Cardiology | Admitting: Cardiology

## 2015-03-25 DIAGNOSIS — I739 Peripheral vascular disease, unspecified: Secondary | ICD-10-CM

## 2015-03-25 DIAGNOSIS — I6523 Occlusion and stenosis of bilateral carotid arteries: Secondary | ICD-10-CM | POA: Insufficient documentation

## 2015-03-25 DIAGNOSIS — E785 Hyperlipidemia, unspecified: Secondary | ICD-10-CM | POA: Diagnosis not present

## 2015-03-25 DIAGNOSIS — I2510 Atherosclerotic heart disease of native coronary artery without angina pectoris: Secondary | ICD-10-CM | POA: Diagnosis not present

## 2015-03-25 DIAGNOSIS — I10 Essential (primary) hypertension: Secondary | ICD-10-CM | POA: Diagnosis not present

## 2015-03-25 DIAGNOSIS — I779 Disorder of arteries and arterioles, unspecified: Secondary | ICD-10-CM

## 2015-03-25 DIAGNOSIS — I2583 Coronary atherosclerosis due to lipid rich plaque: Secondary | ICD-10-CM

## 2015-03-29 DIAGNOSIS — Z23 Encounter for immunization: Secondary | ICD-10-CM | POA: Diagnosis not present

## 2015-04-08 ENCOUNTER — Encounter: Payer: Self-pay | Admitting: Cardiovascular Disease

## 2015-04-17 DIAGNOSIS — H47011 Ischemic optic neuropathy, right eye: Secondary | ICD-10-CM | POA: Diagnosis not present

## 2015-04-17 DIAGNOSIS — H401212 Low-tension glaucoma, right eye, moderate stage: Secondary | ICD-10-CM | POA: Diagnosis not present

## 2015-04-17 DIAGNOSIS — H40012 Open angle with borderline findings, low risk, left eye: Secondary | ICD-10-CM | POA: Diagnosis not present

## 2015-04-18 ENCOUNTER — Ambulatory Visit (INDEPENDENT_AMBULATORY_CARE_PROVIDER_SITE_OTHER): Payer: Medicare Other | Admitting: Pharmacist Clinician (PhC)/ Clinical Pharmacy Specialist

## 2015-04-18 ENCOUNTER — Encounter: Payer: Self-pay | Admitting: Pharmacist Clinician (PhC)/ Clinical Pharmacy Specialist

## 2015-04-18 VITALS — BP 152/76 | HR 56 | Ht 65.0 in | Wt 129.7 lb

## 2015-04-18 DIAGNOSIS — I10 Essential (primary) hypertension: Secondary | ICD-10-CM | POA: Diagnosis not present

## 2015-04-18 DIAGNOSIS — I6522 Occlusion and stenosis of left carotid artery: Secondary | ICD-10-CM | POA: Diagnosis not present

## 2015-04-18 NOTE — Assessment & Plan Note (Addendum)
Today her BP is just above goal at 152/76.  In reviewing her medication routine, she has been taking her hydralazine at breakfast, around 4 pm then bedtime.  I suggested that she move the 4 pm dose to around noon to see if we can get her evening BP readings lower.  She is to continue with her other medications.  I will see her again in 2 months for follow up.

## 2015-04-18 NOTE — Progress Notes (Signed)
04/18/2015 Kendra [name] February 15, 1936 [id number]   HPI:  Kendra [name] is a 79 y.o. female patient of Dr Gwenlyn Found, with a PMH below who presents today for hypertension clinic evaluation.  Kendra [name] has noted that her BP remains WNL for much of the day, but she tends to see spikes up to the 170's late in the day.  She was asked to increase her hydralazine from 12.5 to 25 mg twice daily.  She reports that taking 25 mg in single dose makes her feel lightheaded and dizzy, so instead she has been taking 12.5 mg three times daily.     Cardiac Hx: CAD s/p LAD stent (08/2006), carotid disease, hyperlipidemia   Family Hx: nothing significant  Social Hx:  Quit smoking for pregnancy 48 years ago, rarely drinks alcohol; likes [employer] quality coffee, mixes 50/50 with decaf  Diet: does not add salt to any foods, avoids high sodium food items  Exercise: tai chi twice weekly, walks approx 1 mile many days of the week, weather permitting  Home BP readings:  Morning systolic readings 329-924, evening systolic readings 268-341. Diastolic WNL  Current antihypertensive medications: amlodipine 1.25 mg (states any more will cause LEE), hydralazine 12.5 mg tid, losartan 50 mg bid, clonidine 0.1 mg for BP >962 systolic   Current Outpatient Prescriptions  Medication Sig Dispense Refill  . albuterol (PROVENTIL HFA;VENTOLIN HFA) 108 (90 BASE) MCG/ACT inhaler Inhale 1-2 puffs into the lungs every 6 (six) hours as needed for wheezing or shortness of breath.    [name] amLODipine (NORVASC) 5 MG tablet Take 1.25 mg by mouth daily. 1/4 tablet daily    . beclomethasone (QVAR) 80 MCG/ACT inhaler Inhale 2 puffs into the lungs 2 (two) times daily.    [name] BIOTIN FORTE PO Take 1 capsule by mouth daily.    . cholecalciferol (VITAMIN D) 1000 UNITS tablet Take 1,000 Units by mouth daily.    . clobetasol (OLUX) 0.05 % topical foam Apply 1 application topically every 3 (three) days.    . cloNIDine (CATAPRES) 0.1 MG tablet Take 0.1 mg  by mouth daily as needed (high BP > 170).     . Coenzyme Q10 (CO Q-10) 100 MG CAPS Take 100 mg by mouth daily.     . Cranberry-Vitamin C-Probiotic (AZO CRANBERRY) 250-30 MG TABS Take 250 mg by mouth daily.    [name] ECHINACEA-ZINC-VITAMIN C PO Take 1 tablet by mouth daily.    . Glucosamine-Chondroit-Vit C-Mn (GLUCOSAMINE CHONDR 1500 COMPLX PO) Take 1 tablet by mouth daily.    . hydrALAZINE (APRESOLINE) 25 MG tablet Take 1 tablet (25 mg total) by mouth 2 (two) times daily. 180 tablet 3  . hydrocortisone 2.5 % cream Apply 1 application topically 2 (two) times daily as needed (skin irritation).     [name] losartan (COZAAR) 50 MG tablet Take 50 mg by mouth 2 (two) times daily.    . metaxalone (SKELAXIN) 800 MG tablet Take 400 mg by mouth 4 (four) times daily as needed for muscle spasms.     . Multiple Vitamin (MULTIVITAMIN) capsule Take 1 capsule by mouth daily.    . rosuvastatin (CRESTOR) 5 MG tablet Take 5 mg by mouth every other day.      No current facility-administered medications for this visit.    Allergies  Allergen Reactions  . Statins     Unable to tolerate statins w the exception of crestor.  . Sulfa Antibiotics Other (See Comments)    "didnt feel good"  . Zetia [Ezetimibe]  Other (See Comments)    "makes me constipated"  . Penicillins Rash    Past Medical History  Diagnosis Date  . HTN (hypertension)   . CAD (coronary artery disease)     last cath 08/2006  . Hyperlipidemia   . Asthma   . Hearing loss   . DJD (degenerative joint disease), lumbar   . Vision loss of right eye   . Carotid artery disease (HCC)     moderate left ICA stenosis by 2.6 ultrasound    Blood pressure 152/76, pulse 56, height 5' 5"  (1.651 m), weight 129 lb 11.2 oz (58.832 kg).    Tommy Medal PharmD CPP [facility]

## 2015-04-18 NOTE — Patient Instructions (Signed)
Return for a a follow up appointment in 2 months  Your blood pressure today is 152/76 (goal <150/90)  Check your blood pressure at home daily (if able) and keep record of the readings.  Take your BP meds as follows: hydralazine doses in morning, at lunch, then evening;  All other medications stay the same  Bring all of your meds, your BP cuff and your record of home blood pressures to your next appointment.  Exercise as you're able, try to walk approximately 30 minutes per day.  Keep salt intake to a minimum, especially watch canned and prepared boxed foods.  Eat more fresh fruits and vegetables and fewer canned items.  Avoid eating in fast food restaurants.    HOW TO TAKE YOUR BLOOD PRESSURE: . Rest 5 minutes before taking your blood pressure. .  Don't smoke or drink caffeinated beverages for at least 30 minutes before. . Take your blood pressure before (not after) you eat. . Sit comfortably with your back supported and both feet on the floor (don't cross your legs). . Elevate your arm to heart level on a table or a desk. . Use the proper sized cuff. It should fit smoothly and snugly around your bare upper arm. There should be enough room to slip a fingertip under the cuff. The bottom edge of the cuff should be 1 inch above the crease of the elbow. . Ideally, take 3 measurements at one sitting and record the average.

## 2015-04-24 ENCOUNTER — Emergency Department (INDEPENDENT_AMBULATORY_CARE_PROVIDER_SITE_OTHER)
Admission: EM | Admit: 2015-04-24 | Discharge: 2015-04-24 | Disposition: A | Discharge status: Home / Self Care | Payer: Medicare Other | Source: Home / Self Care

## 2015-04-24 ENCOUNTER — Encounter (HOSPITAL_COMMUNITY): Payer: Self-pay | Admitting: Emergency Medicine

## 2015-04-24 DIAGNOSIS — S51811A Laceration without foreign body of right forearm, initial encounter: Secondary | ICD-10-CM | POA: Diagnosis not present

## 2015-04-24 NOTE — ED Notes (Signed)
Tripped on computer matt, catching self with right forearm on the rounded edge of counter.  Patient has a skin tear to forearm.  Patient also has soreness in right elbow with minimal bruising to elbow.  Incident occurred yesterday

## 2015-04-24 NOTE — Discharge Instructions (Signed)
Laceration Care, Adult A laceration is a cut that goes through all layers of the skin. The cut also goes into the tissue that is right under the skin. Some cuts heal on their own. Others need to be closed with stitches (sutures), staples, skin adhesive strips, or wound glue. Taking care of your cut lowers your risk of infection and helps your cut to heal better. HOW TO TAKE CARE OF YOUR CUT For stitches or staples:  Keep the wound clean and dry.  If you were given a bandage (dressing), you should change it at least one time per day or as told by your doctor. You should also change it if it gets wet or dirty.  Keep the wound completely dry for the first 24 hours or as told by your doctor. After that time, you may take a shower or a bath. However, make sure that the wound is not soaked in water until after the stitches or staples have been removed.  Clean the wound one time each day or as told by your doctor:  Wash the wound with soap and water.  Rinse the wound with water until all of the soap comes off.  Pat the wound dry with a clean towel. Do not rub the wound.  After you clean the wound, put a thin layer of antibiotic ointment on it as told by your doctor. This ointment:  Helps to prevent infection.  Keeps the bandage from sticking to the wound.  Have your stitches or staples removed as told by your doctor. If your doctor used skin adhesive strips:   Keep the wound clean and dry.  If you were given a bandage, you should change it at least one time per day or as told by your doctor. You should also change it if it gets dirty or wet.  Do not get the skin adhesive strips wet. You can take a shower or a bath, but be careful to keep the wound dry.  If the wound gets wet, pat it dry with a clean towel. Do not rub the wound.  Skin adhesive strips fall off on their own. You can trim the strips as the wound heals. Do not remove any strips that are still stuck to the wound. They will  fall off after a while. If your doctor used wound glue:  Try to keep your wound dry, but you may briefly wet it in the shower or bath. Do not soak the wound in water, such as by swimming.  After you take a shower or a bath, gently pat the wound dry with a clean towel. Do not rub the wound.  Do not do any activities that will make you really sweaty until the skin glue has fallen off on its own.  Do not apply liquid, cream, or ointment medicine to your wound while the skin glue is still on.  If you were given a bandage, you should change it at least one time per day or as told by your doctor. You should also change it if it gets dirty or wet.  If a bandage is placed over the wound, do not let the tape for the bandage touch the skin glue.  Do not pick at the glue. The skin glue usually stays on for 5-10 days. Then, it falls off of the skin. General Instructions  To help prevent scarring, make sure to cover your wound with sunscreen whenever you are outside after stitches are removed, after adhesive strips are removed,  or when wound glue stays in place and the wound is healed. Make sure to wear a sunscreen of at least 30 SPF.  Take over-the-counter and prescription medicines only as told by your doctor.  If you were given antibiotic medicine or ointment, take or apply it as told by your doctor. Do not stop using the antibiotic even if your wound is getting better.  Do not scratch or pick at the wound.  Keep all follow-up visits as told by your doctor. This is important.  Check your wound every day for signs of infection. Watch for:  Redness, swelling, or pain.  Fluid, blood, or pus.  Raise (elevate) the injured area above the level of your heart while you are sitting or lying down, if possible. GET HELP IF:  You got a tetanus shot and you have any of these problems at the injection site:  Swelling.  Very bad pain.  Redness.  Bleeding.  You have a fever.  A wound that was  closed breaks open.  You notice a bad smell coming from your wound or your bandage.  You notice something coming out of the wound, such as wood or glass.  Medicine does not help your pain.  You have more redness, swelling, or pain at the site of your wound.  You have fluid, blood, or pus coming from your wound.  You notice a change in the color of your skin near your wound.  You need to change the bandage often because fluid, blood, or pus is coming from the wound.  You start to have a new rash.  You start to have numbness around the wound. GET HELP RIGHT AWAY IF:  You have very bad swelling around the wound.  Your pain suddenly gets worse and is very bad.  You notice painful lumps near the wound or on skin that is anywhere on your body.  You have a red streak going away from your wound.  The wound is on your hand or foot and you cannot move a finger or toe like you usually can.  The wound is on your hand or foot and you notice that your fingers or toes look pale or bluish.   This information is not intended to replace advice given to you by your health care provider. Make sure you discuss any questions you have with your health care provider.   Document Released: 11/11/2007 Document Revised: 10/09/2014 Document Reviewed: 05/21/2014 Elsevier Interactive Patient Education 2016 [place]. Tissue Adhesive Wound Care Some cuts and wounds can be closed with tissue adhesive. Adhesive is like glue. It holds the skin together and helps a wound heal faster. This adhesive goes away on its own as the wound heals.  HOME CARE   Showers are allowed. Do not soak the wound in water. Do not take baths, swim, or use hot tubs. Do not use soaps or creams on your wound.  If a bandage (dressing) was put on, change it as often as told by your doctor.  Keep the bandage dry.  Do not scratch, pick, or rub the adhesive.  Do not put tape over the adhesive. The adhesive could come  off.  Protect the wound from another injury.  Protect the wound from sun and tanning beds.  Only take medicine as told by your doctor.  Keep all doctor visits as told. GET HELP RIGHT AWAY IF:   Your wound is red, puffy (swollen), hot, or tender.  You get a rash after the glue  is put on.  You have more pain in the wound.  You have a red streak going away from the wound.  You have yellowish-white fluid (pus) coming from the wound.  You have more bleeding.  You have a fever.  You have chills and start to shake.  You notice a bad smell coming from the wound.  Your wound or adhesive breaks open. MAKE SURE YOU:   Understand these instructions.  Will watch your condition.  Will get help right away if you are not doing well or get worse.   This information is not intended to replace advice given to you by your health care provider. Make sure you discuss any questions you have with your health care provider.   Document Released: 03/03/2008 Document Revised: 03/15/2013 Document Reviewed: 12/14/2012 Elsevier Interactive Patient Education [employer].

## 2015-05-08 DIAGNOSIS — M859 Disorder of bone density and structure, unspecified: Secondary | ICD-10-CM | POA: Diagnosis not present

## 2015-05-08 DIAGNOSIS — I6522 Occlusion and stenosis of left carotid artery: Secondary | ICD-10-CM | POA: Diagnosis not present

## 2015-05-08 DIAGNOSIS — G459 Transient cerebral ischemic attack, unspecified: Secondary | ICD-10-CM | POA: Diagnosis not present

## 2015-05-08 DIAGNOSIS — E782 Mixed hyperlipidemia: Secondary | ICD-10-CM | POA: Diagnosis not present

## 2015-05-08 DIAGNOSIS — I10 Essential (primary) hypertension: Secondary | ICD-10-CM | POA: Diagnosis not present

## 2015-05-08 DIAGNOSIS — I2510 Atherosclerotic heart disease of native coronary artery without angina pectoris: Secondary | ICD-10-CM | POA: Diagnosis not present

## 2015-05-08 DIAGNOSIS — M25521 Pain in right elbow: Secondary | ICD-10-CM | POA: Diagnosis not present

## 2015-05-08 DIAGNOSIS — H9190 Unspecified hearing loss, unspecified ear: Secondary | ICD-10-CM | POA: Diagnosis not present

## 2015-05-20 ENCOUNTER — Telehealth: Payer: Self-pay | Admitting: Pharmacist Clinician (PhC)/ Clinical Pharmacy Specialist

## 2015-05-21 NOTE — Telephone Encounter (Signed)
Close encounter 

## 2015-06-18 ENCOUNTER — Ambulatory Visit (INDEPENDENT_AMBULATORY_CARE_PROVIDER_SITE_OTHER): Payer: Medicare Other | Admitting: Pharmacist Clinician (PhC)/ Clinical Pharmacy Specialist

## 2015-06-18 ENCOUNTER — Encounter: Payer: Self-pay | Admitting: Pharmacist Clinician (PhC)/ Clinical Pharmacy Specialist

## 2015-06-18 VITALS — BP 134/66 | HR 52 | Ht 65.0 in | Wt 127.6 lb

## 2015-06-18 DIAGNOSIS — I10 Essential (primary) hypertension: Secondary | ICD-10-CM | POA: Diagnosis not present

## 2015-06-18 NOTE — Patient Instructions (Signed)
Your blood pressure today is good at 134/66  (goal is to stay below <150/90)  Check your blood pressure at home several days each week and keep record of the readings.  Take your BP meds as follows: continue all medications  Bring all of your meds, your BP cuff and your record of home blood pressures to your next appointment.  Exercise as you're able, try to walk approximately 30 minutes per day.  Keep salt intake to a minimum, especially watch canned and prepared boxed foods.  Eat more fresh fruits and vegetables and fewer canned items.  Avoid eating in fast food restaurants.    HOW TO TAKE YOUR BLOOD PRESSURE: . Rest 5 minutes before taking your blood pressure. .  Don't smoke or drink caffeinated beverages for at least 30 minutes before. . Take your blood pressure before (not after) you eat. . Sit comfortably with your back supported and both feet on the floor (don't cross your legs). . Elevate your arm to heart level on a table or a desk. . Use the proper sized cuff. It should fit smoothly and snugly around your bare upper arm. There should be enough room to slip a fingertip under the cuff. The bottom edge of the cuff should be 1 inch above the crease of the elbow. . Ideally, take 3 measurements at one sitting and record the average.

## 2015-06-18 NOTE — Assessment & Plan Note (Addendum)
Today her BP is well controlled at 134/66.  I have encouraged her to continue with her current regimen.  She believes the muscle aches are related to her BP medications, but I suspect they are more related to the rosuvastatin.  She will continue with her medications and call us should she need more doses of clonidine or sees regular increases in her BP.  I also suggested she decrease her rosuvastatin to 5 mg twice weekly to see if that will solve her muscle aches.

## 2015-06-18 NOTE — Progress Notes (Signed)
06/18/2015 Kendra [name] Nov 20, 1935 [id number]   HPI:  Kendra [name] is a 80 y.o. female patient of Dr Gwenlyn Found, with a PMH below who presents today for hypertension clinic follow up.  She has no complaints over the past couple of months, only one reading in the past month has been > 150/90 and she took the clonidine only that one time.  She does complain of joint and muscle pain as well as increased hair loss.   She does not like being on multiple medications for her BP, but has no specific complaints related specifically to them.  She is on rosuvastatin 5 mg, it was suggested that she take it every other day, however she has been taking 2.5 mg daily.     Cardiac Hx: CAD s/p LAD stent (08/2006), carotid disease, hyperlipidemia   Family Hx: nothing significant  Social Hx:  Quit smoking for pregnancy 48 years ago, rarely drinks alcohol; likes [employer] quality coffee, mixes 50/50 with decaf  Diet: does not add salt to any foods, avoids high sodium food items, shop mainly at [employer] or [employer]  Exercise: tai chi twice weekly, walks approx 1 mile many days of the week, weather permitting  Home BP readings:  Mostly 295-747'B systolic, only one reading > 150.  Used her clonidine only once in past 2 months  Current antihypertensive medications: amlodipine 1.25 mg (states any more will cause LEE), hydralazine 12.5 mg tid, losartan 50 mg bid, clonidine 0.1 mg for BP >403 systolic   Current Outpatient Prescriptions  Medication Sig Dispense Refill  . albuterol (PROVENTIL HFA;VENTOLIN HFA) 108 (90 BASE) MCG/ACT inhaler Inhale 1-2 puffs into the lungs every 6 (six) hours as needed for wheezing or shortness of breath.    [name] amLODipine (NORVASC) 5 MG tablet Take 1.25 mg by mouth daily. 1/4 tablet daily    . beclomethasone (QVAR) 80 MCG/ACT inhaler Inhale 2 puffs into the lungs 2 (two) times daily.    [name] BIOTIN FORTE PO Take 1 capsule by mouth daily.    . cholecalciferol (VITAMIN D) 1000 UNITS  tablet Take 1,000 Units by mouth daily.    . clobetasol (OLUX) 0.05 % topical foam Apply 1 application topically every 3 (three) days.    . cloNIDine (CATAPRES) 0.1 MG tablet Take 0.1 mg by mouth daily as needed (high BP > 170).     . Coenzyme Q10 (CO Q-10) 100 MG CAPS Take 100 mg by mouth daily.     . Cranberry-Vitamin C-Probiotic (AZO CRANBERRY) 250-30 MG TABS Take 250 mg by mouth daily.    [name] ECHINACEA-ZINC-VITAMIN C PO Take 1 tablet by mouth daily.    . Glucosamine-Chondroit-Vit C-Mn (GLUCOSAMINE CHONDR 1500 COMPLX PO) Take 1 tablet by mouth daily.    . hydrALAZINE (APRESOLINE) 25 MG tablet Take 1 tablet (25 mg total) by mouth 2 (two) times daily. 180 tablet 3  . hydrocortisone 2.5 % cream Apply 1 application topically 2 (two) times daily as needed (skin irritation).     [name] losartan (COZAAR) 50 MG tablet Take 50 mg by mouth 2 (two) times daily.    . metaxalone (SKELAXIN) 800 MG tablet Take 400 mg by mouth 4 (four) times daily as needed for muscle spasms.     . Multiple Vitamin (MULTIVITAMIN) capsule Take 1 capsule by mouth daily.    . rosuvastatin (CRESTOR) 5 MG tablet Take 5 mg by mouth every other day.      No current facility-administered medications for this visit.  Allergies  Allergen Reactions  . Statins     Unable to tolerate statins w the exception of crestor.  . Sulfa Antibiotics Other (See Comments)    "didnt feel good"  . Zetia [Ezetimibe] Other (See Comments)    "makes me constipated"  . Penicillins Rash    Past Medical History  Diagnosis Date  . HTN (hypertension)   . CAD (coronary artery disease)     last cath 08/2006  . Hyperlipidemia   . Asthma   . Hearing loss   . DJD (degenerative joint disease), lumbar   . Vision loss of right eye   . Carotid artery disease (HCC)     moderate left ICA stenosis by 2.6 ultrasound    Blood pressure 134/66, pulse 52, height 5' 5"  (1.651 m), weight 127 lb 9.6 oz (57.879 kg).    Tommy Medal PharmD CPP [facility]

## 2015-09-13 DIAGNOSIS — J029 Acute pharyngitis, unspecified: Secondary | ICD-10-CM | POA: Diagnosis not present

## 2015-09-16 DIAGNOSIS — J0100 Acute maxillary sinusitis, unspecified: Secondary | ICD-10-CM | POA: Diagnosis not present

## 2015-09-24 ENCOUNTER — Encounter: Payer: Self-pay | Admitting: Cardiovascular Disease

## 2015-09-24 ENCOUNTER — Ambulatory Visit (INDEPENDENT_AMBULATORY_CARE_PROVIDER_SITE_OTHER): Payer: Medicare Other | Admitting: Cardiovascular Disease

## 2015-09-24 VITALS — BP 150/75 | HR 58 | Ht 65.0 in | Wt 124.4 lb

## 2015-09-24 DIAGNOSIS — E785 Hyperlipidemia, unspecified: Secondary | ICD-10-CM | POA: Diagnosis not present

## 2015-09-24 DIAGNOSIS — I10 Essential (primary) hypertension: Secondary | ICD-10-CM | POA: Diagnosis not present

## 2015-09-24 DIAGNOSIS — I779 Disorder of arteries and arterioles, unspecified: Secondary | ICD-10-CM | POA: Diagnosis not present

## 2015-09-24 DIAGNOSIS — I2510 Atherosclerotic heart disease of native coronary artery without angina pectoris: Secondary | ICD-10-CM | POA: Diagnosis not present

## 2015-09-24 DIAGNOSIS — I2583 Coronary atherosclerosis due to lipid rich plaque: Principal | ICD-10-CM

## 2015-09-24 DIAGNOSIS — I739 Peripheral vascular disease, unspecified: Secondary | ICD-10-CM

## 2015-09-24 NOTE — Assessment & Plan Note (Signed)
History of hyperlipidemia on Crestor followed by her PCP 

## 2015-09-24 NOTE — Assessment & Plan Note (Signed)
History of hypertension blood pressure measured at 150/75. She is on amlodipine, clonidine, losartan and hydralazine. Continue current meds at current dose

## 2015-09-24 NOTE — Progress Notes (Signed)
09/24/2015 Kendra [name]   [birth year]  [id number]  Primary Physician Anthoney Harada, MD Primary Cardiologist: Lorretta Harp MD Renae Gloss   HPI:  Kendra [name] is a 80 year old mildly overweight married Caucasian female mother of one daughter who recently relocated within the last year or 2 from [place] to be [place] to be closer to her daughter. I last saw her in the office 03/15/15..She self-referred to be established in our practice for ongoing cardiac care. She had previously seen Dr. Candee Furbish. I last saw her in the office 12/15/13. Her risk factors for heart disease are remarkable for treated hypertension, hyper-lipidemia and family history. Her mother did have 2 myocardial infarctions. She has never smoked. She had 2 overlapping stents placed in her [facility] in [facility] clinic March 08 has been asymptomatic since. She had negative Myoview one year ago. She has had carotid Dopplers last year and again this year which show mild to moderate left internal carotid artery stenosis. Since I saw her last her only complaints are of nocturnal spikes of her systolic blood pressure up to the 190 range.   Current Outpatient Prescriptions  Medication Sig Dispense Refill  . albuterol (PROVENTIL HFA;VENTOLIN HFA) 108 (90 BASE) MCG/ACT inhaler Inhale 1-2 puffs into the lungs every 12 (twelve) hours as needed for wheezing or shortness of breath.     [name] amLODipine (NORVASC) 5 MG tablet Take 1.25 mg by mouth daily. 1/4 tablet daily    . beclomethasone (QVAR) 80 MCG/ACT inhaler Inhale 2 puffs into the lungs 2 (two) times daily.    [name] BIOTIN FORTE PO Take 1 capsule by mouth daily.    . cholecalciferol (VITAMIN D) 1000 UNITS tablet Take 1,000 Units by mouth daily.    . clobetasol (OLUX) 0.05 % topical foam Apply 1 application topically every 3 (three) days.    . cloNIDine (CATAPRES) 0.1 MG tablet Take 0.1 mg by mouth daily as needed (high BP > 170).     . Coenzyme Q10 (CO Q-10) 100  MG CAPS Take 100 mg by mouth daily.     . Cranberry-Vitamin C-Probiotic (AZO CRANBERRY) 250-30 MG TABS Take 250 mg by mouth daily.    [name] ECHINACEA-ZINC-VITAMIN C PO Take 1 tablet by mouth daily.    . hydrALAZINE (APRESOLINE) 25 MG tablet Take 12.5 mg by mouth 3 (three) times daily.    . hydrocortisone 2.5 % cream Apply 1 application topically 2 (two) times daily as needed (skin irritation).     [name] losartan (COZAAR) 50 MG tablet Take 50 mg by mouth 2 (two) times daily.    . metaxalone (SKELAXIN) 800 MG tablet Take 400 mg by mouth 4 (four) times daily as needed for muscle spasms.     . Multiple Vitamin (MULTIVITAMIN) capsule Take 1 capsule by mouth daily.    . rosuvastatin (CRESTOR) 5 MG tablet Take 5 mg by mouth every other day.      No current facility-administered medications for this visit.    Allergies  Allergen Reactions  . Latex Other (See Comments)    Irritates her skin  . Statins     Unable to tolerate statins w the exception of crestor.  . Sulfa Antibiotics Other (See Comments)    "didnt feel good"  . Sulfamethoxazole Other (See Comments)    Makes her feel bad  . Zetia [Ezetimibe] Other (See Comments)    "makes me constipated"  . Penicillins Rash    Social History   Social History  .  Marital Status: Married    Spouse Name: N/A  . Number of Children: 1  . Years of Education: MAx2   Occupational History  . Retired   . Social Worker    Social History Main Topics  . Smoking status: Former Smoker    Quit date: 06/08/1966  . Smokeless tobacco: Never Used  . Alcohol Use: 0.5 oz/week    1 drink(s) per week     Comment: wine  . Drug Use: No  . Sexual Activity: Not on file   Other Topics Concern  . Not on file   Social History Narrative     Review of Systems: General: negative for chills, fever, night sweats or weight changes.  Cardiovascular: negative for chest pain, dyspnea on exertion, edema, orthopnea, palpitations, paroxysmal nocturnal dyspnea or shortness  of breath Dermatological: negative for rash Respiratory: negative for cough or wheezing Urologic: negative for hematuria Abdominal: negative for nausea, vomiting, diarrhea, bright red blood per rectum, melena, or hematemesis Neurologic: negative for visual changes, syncope, or dizziness All other systems reviewed and are otherwise negative except as noted above.    Blood pressure 150/75, pulse 58, height 5\' 5"  (1.651 m), weight 124 lb 6.4 oz (56.427 kg), SpO2 98 %.  General appearance: alert and no distress Neck: no adenopathy, no carotid bruit, no JVD, supple, symmetrical, trachea midline and thyroid not enlarged, symmetric, no tenderness/mass/nodules Lungs: clear to auscultation bilaterally Heart: regular rate and rhythm, S1, S2 normal, no murmur, click, rub or gallop Extremities: extremities normal, atraumatic, no cyanosis or edema  EKG not performed today  ASSESSMENT AND PLAN:   Coronary artery disease History of CAD status post 2 overlapping stents placed in her LAD in [place] clinic in March 2008. She has been asymptomatic since.  Essential hypertension History of hypertension blood pressure measured at 150/75. She is on amlodipine, clonidine, losartan and hydralazine. Continue current meds at current dose  Hyperlipidemia History of hyperlipidemia on Crestor followed by her PCP  Carotid artery disease History of carotid artery disease Dopplers performed 03/25/15 revealing mild to moderate right ICA stenosis.      Lorretta Harp MD FACP,FACC,FAHA, [facility] 09/24/2015 3:08 PM

## 2015-09-24 NOTE — Assessment & Plan Note (Signed)
History of CAD status post 2 overlapping stents placed in her LAD in [place] clinic in March 2008. She has been asymptomatic since.

## 2015-09-24 NOTE — Patient Instructions (Signed)
Medication Instructions:  Your physician recommends that you continue on your current medications as directed. Please refer to the Current Medication list given to you today.   Labwork: none  Testing/Procedures: none  Follow-Up: Your physician recommends that you schedule a follow-up appointment in: Kendra [name] - discuss medications  Your physician wants you to follow-up in: 12 months with Dr. Gwenlyn Found. You will receive a reminder letter in the mail two months in advance. If you don't receive a letter, please call our office to schedule the follow-up appointment.    Any Other Special Instructions Will Be Listed Below (If Applicable). Opthalmologist:  Dr. Rutherford Guys - [phone] Dr. Shon Hough - [phone]   If you need a refill on your cardiac medications before your next appointment, please call your pharmacy.

## 2015-09-24 NOTE — Assessment & Plan Note (Signed)
History of carotid artery disease Dopplers performed 03/25/15 revealing mild to moderate right ICA stenosis.

## 2015-09-27 DIAGNOSIS — M25551 Pain in right hip: Secondary | ICD-10-CM | POA: Diagnosis not present

## 2015-10-10 ENCOUNTER — Ambulatory Visit (INDEPENDENT_AMBULATORY_CARE_PROVIDER_SITE_OTHER): Payer: Medicare Other | Admitting: Pharmacist

## 2015-10-10 VITALS — BP 134/62

## 2015-10-10 DIAGNOSIS — I10 Essential (primary) hypertension: Secondary | ICD-10-CM

## 2015-10-10 DIAGNOSIS — I2510 Atherosclerotic heart disease of native coronary artery without angina pectoris: Secondary | ICD-10-CM

## 2015-10-10 NOTE — Progress Notes (Signed)
Patient ID: Kendra [name]                 DOB: 01-31-1936                      MRN: [id number]     HPI: Bevelyn Arriola is a 80 y.o. female patient of Dr. Gwenlyn Found that presents for follow-up. She states that she believes the hydralazine is causing her to have joint pain and vision changes. She requests to cut back on the hydralazine if possible.  At her last visit it was recommended that she cut back her Crestor dose to three times a week. She says that she has seen some improvement.   Current HTN meds:  Amlodipine 1.77m daily - more causes LLE Hydralazine 12.557mTID Losartan 5095mID Clonidine 0.16.9SWn >17>546stolic  Previously tried: she was unable to name specific medications, but said that [place]  BP goal: <150/90  Family History: not significant   Social History: previous smoker (quit 48 years ago)  Diet: does not add salt  Exercise: tai chi three times a week, walks most days of the week  Home BP readings: per patient report mid120s-high 140s/60s  Wt Readings from Last 3 Encounters:  09/24/15 124 lb 6.4 oz (56.427 kg)  06/18/15 127 lb 9.6 oz (57.879 kg)  04/18/15 129 lb 11.2 oz (58.832 kg)   BP Readings from Last 3 Encounters:  10/10/15 134/62  09/24/15 150/75  06/18/15 134/66   Pulse Readings from Last 3 Encounters:  09/24/15 58  06/18/15 52  04/18/15 56    Renal function: CrCl cannot be calculated (Unknown ideal weight.).  Past Medical History  Diagnosis Date  . HTN (hypertension)   . CAD (coronary artery disease)     last cath 08/2006  . Hyperlipidemia   . Asthma   . Hearing loss   . DJD (degenerative joint disease), lumbar   . Vision loss of right eye   . Carotid artery disease (HCC)     moderate left ICA stenosis by 2.6 ultrasound    Current Outpatient Prescriptions on File Prior to Visit  Medication Sig Dispense Refill  . hydrALAZINE (APRESOLINE) 25 MG tablet Take 12.5 mg by  mouth 3 (three) times daily.    . aMarland Kitchenbuterol (PROVENTIL HFA;VENTOLIN HFA) 108 (90 BASE) MCG/ACT inhaler Inhale 1-2 puffs into the lungs every 12 (twelve) hours as needed for wheezing or shortness of breath.     . aMarland KitchenLODipine (NORVASC) 5 MG tablet Take 1.25 mg by mouth daily. 1/4 tablet daily    . beclomethasone (QVAR) 80 MCG/ACT inhaler Inhale 2 puffs into the lungs 2 (two) times daily.    . BMarland KitchenOTIN FORTE PO Take 1 capsule by mouth daily.    . cholecalciferol (VITAMIN D) 1000 UNITS tablet Take 1,000 Units by mouth daily.    . clobetasol (OLUX) 0.05 % topical foam Apply 1 application topically every 3 (three) days.    . cloNIDine (CATAPRES) 0.1 MG tablet Take 0.1 mg by mouth daily as needed (high BP > 170).     . Coenzyme Q10 (CO Q-10) 100 MG CAPS Take 100 mg by mouth daily.     . Cranberry-Vitamin C-Probiotic (AZO CRANBERRY) 250-30 MG TABS Take 250 mg by mouth daily.    . EMarland KitchenHINACEA-ZINC-VITAMIN C PO Take 1 tablet by mouth daily.    . hydrocortisone 2.5 % cream Apply 1 application topically 2 (two)  times daily as needed (skin irritation).     [name] losartan (COZAAR) 50 MG tablet Take 50 mg by mouth 2 (two) times daily.    . metaxalone (SKELAXIN) 800 MG tablet Take 400 mg by mouth 4 (four) times daily as needed for muscle spasms.     . Multiple Vitamin (MULTIVITAMIN) capsule Take 1 capsule by mouth daily.    . rosuvastatin (CRESTOR) 5 MG tablet Take 5 mg by mouth every other day.      No current facility-administered medications on file prior to visit.    Allergies  Allergen Reactions  . Latex Other (See Comments)    Irritates her skin  . Statins     Unable to tolerate statins w the exception of crestor.  . Sulfa Antibiotics Other (See Comments)    "didnt feel good"  . Sulfamethoxazole Other (See Comments)    Makes her feel bad  . Zetia [Ezetimibe] Other (See Comments)    "makes me constipated"  . Penicillins Rash     Assessment/Plan: Hypertension: BP is at goal. She reports most readings  at home are also at goal with the occasional spike (systolics 400-180 range) about once every 2-3 weeks. She is interested in pursuing options to decrease the amount of hydralazine she is using. Could potentially try changing losartan to valsartan or olmesartan to see if hydralazine can be weaned. She will call insurance plan to see about price of these medications. Will have her continue to monitor and follow-up in hypertension clinic in 1 month.   Thank you, Lelan Pons. Patterson Hammersmith, [facility]  10/10/2015 9:48 PM

## 2015-10-10 NOTE — Assessment & Plan Note (Signed)
BP is at goal. She reports most readings at home are also at goal with the occasional spike (systolics [id number] range) about once every 2-3 weeks. She is interested in pursuing options to decrease the amount of hydralazine she is using. Could potentially try changing losartan to valsartan or olmesartan to see if hydralazine can be weaned. She will call insurance plan to see about price of these medications. Will have her continue to monitor and follow-up in hypertension clinic in 1 month.

## 2015-10-10 NOTE — Patient Instructions (Addendum)
Valsartan or olmesartan are another alternative to losartan  Return for a follow up appointment in 1 month  Your blood pressure today is 134/62  Check your blood pressure at home daily (if able) and keep record of the readings.  Bring all of your meds, your BP cuff and your record of home blood pressures to your next appointment.  Exercise as you're able, try to walk approximately 30 minutes per day.  Keep salt intake to a minimum, especially watch canned and prepared boxed foods.  Eat more fresh fruits and vegetables and fewer canned items.  Avoid eating in fast food restaurants.    HOW TO TAKE YOUR BLOOD PRESSURE: . Rest 5 minutes before taking your blood pressure. .  Don't smoke or drink caffeinated beverages for at least 30 minutes before. . Take your blood pressure before (not after) you eat. . Sit comfortably with your back supported and both feet on the floor (don't cross your legs). . Elevate your arm to heart level on a table or a desk. . Use the proper sized cuff. It should fit smoothly and snugly around your bare upper arm. There should be enough room to slip a fingertip under the cuff. The bottom edge of the cuff should be 1 inch above the crease of the elbow. . Ideally, take 3 measurements at one sitting and record the average.

## 2015-10-22 ENCOUNTER — Encounter: Payer: Self-pay | Admitting: Pharmacist

## 2015-10-29 DIAGNOSIS — M859 Disorder of bone density and structure, unspecified: Secondary | ICD-10-CM | POA: Diagnosis not present

## 2015-10-29 DIAGNOSIS — I2510 Atherosclerotic heart disease of native coronary artery without angina pectoris: Secondary | ICD-10-CM | POA: Diagnosis not present

## 2015-10-29 DIAGNOSIS — I6522 Occlusion and stenosis of left carotid artery: Secondary | ICD-10-CM | POA: Diagnosis not present

## 2015-10-29 DIAGNOSIS — I10 Essential (primary) hypertension: Secondary | ICD-10-CM | POA: Diagnosis not present

## 2015-10-29 DIAGNOSIS — R739 Hyperglycemia, unspecified: Secondary | ICD-10-CM | POA: Diagnosis not present

## 2015-10-29 DIAGNOSIS — E782 Mixed hyperlipidemia: Secondary | ICD-10-CM | POA: Diagnosis not present

## 2015-11-01 DIAGNOSIS — E782 Mixed hyperlipidemia: Secondary | ICD-10-CM | POA: Diagnosis not present

## 2015-11-01 DIAGNOSIS — I10 Essential (primary) hypertension: Secondary | ICD-10-CM | POA: Diagnosis not present

## 2015-11-01 DIAGNOSIS — I2510 Atherosclerotic heart disease of native coronary artery without angina pectoris: Secondary | ICD-10-CM | POA: Diagnosis not present

## 2015-11-01 DIAGNOSIS — I6522 Occlusion and stenosis of left carotid artery: Secondary | ICD-10-CM | POA: Diagnosis not present

## 2015-11-01 DIAGNOSIS — R739 Hyperglycemia, unspecified: Secondary | ICD-10-CM | POA: Diagnosis not present

## 2015-11-01 DIAGNOSIS — M859 Disorder of bone density and structure, unspecified: Secondary | ICD-10-CM | POA: Diagnosis not present

## 2015-11-14 ENCOUNTER — Ambulatory Visit (INDEPENDENT_AMBULATORY_CARE_PROVIDER_SITE_OTHER): Payer: Medicare Other | Admitting: Pharmacist Clinician (PhC)/ Clinical Pharmacy Specialist

## 2015-11-14 VITALS — BP 124/76 | HR 52 | Ht 65.5 in | Wt 128.8 lb

## 2015-11-14 DIAGNOSIS — I2510 Atherosclerotic heart disease of native coronary artery without angina pectoris: Secondary | ICD-10-CM | POA: Diagnosis not present

## 2015-11-14 DIAGNOSIS — I10 Essential (primary) hypertension: Secondary | ICD-10-CM

## 2015-11-14 MED ORDER — VALSARTAN 160 MG PO TABS: 160.0000 mg | ORAL_TABLET | Freq: Every day | ORAL | Status: DC

## 2015-11-14 NOTE — Assessment & Plan Note (Signed)
Today her BP continues to do well with the occasional spike into the 123XX123 systolic.  Because she would like to come off the hydralazine, I am going to switch her from losartan 50 mg bid to valsartan 160 mg qd.  She will discontinue the hydralazine for now.  She will continue with her daily home BP checks and see [place] back in 3-4 weeks.

## 2015-11-14 NOTE — Patient Instructions (Signed)
Return for a a follow up appointment in 1 month  Your blood pressure today is 124/76  (goal is < 150/90)  Check your blood pressure at home several days each week and keep record of the readings.  Take your BP meds as follows: switch losartan to valsartan 160 mg once daily in the evenings.  Continue with the amlodipine 1.25 mg each morning and clonidine 0.1 mg as needed for systolic BP [id number].  For now stop the hydralazine.   Bring all of your meds, your BP cuff and your record of home blood pressures to your next appointment.  Exercise as you're able, try to walk approximately 30 minutes per day.  Keep salt intake to a minimum, especially watch canned and prepared boxed foods.  Eat more fresh fruits and vegetables and fewer canned items.  Avoid eating in fast food restaurants.    HOW TO TAKE YOUR BLOOD PRESSURE: . Rest 5 minutes before taking your blood pressure. .  Don't smoke or drink caffeinated beverages for at least 30 minutes before. . Take your blood pressure before (not after) you eat. . Sit comfortably with your back supported and both feet on the floor (don't cross your legs). . Elevate your arm to heart level on a table or a desk. . Use the proper sized cuff. It should fit smoothly and snugly around your bare upper arm. There should be enough room to slip a fingertip under the cuff. The bottom edge of the cuff should be 1 inch above the crease of the elbow. . Ideally, take 3 measurements at one sitting and record the average.

## 2015-11-14 NOTE — Progress Notes (Signed)
Patient ID: Kendra [name]                 DOB: 03-22-1936                      MRN: [id number]     HPI: Kendra [name] is a 80 y.o. female patient of Dr. Gwenlyn Found that presents for follow-up.  She has continued to have mostly controlled blood pressure, with the occasional spike up as high as 119'E systolic.  She has not been able to discern any specific causes of these, and it occurred twice in the past month.  She would like to come off the hydralazine.    She continues with Crestor three times weekly, still having some myalgias, but not enough to be bothersome.   Current HTN meds:  Amlodipine 1.50m daily - more causes LLE Hydralazine 12.581mTID Losartan 5062mID Clonidine 0.11.7EYn >17>814stolic (took x 2 in past month)  Previously tried: she was unable to name specific medications, but said that [place]  BP goal: <150/90  Family History: not significant   Social History: previous smoker (quit 48 years ago)  Diet: does not add salt; vegetarian diet  Exercise: tai chi three times a week, walks most days of the week  Home BP readings: average 132/62; only 2 readings > 140481stolic ([id number] Readings from Last 3 Encounters:  09/24/15 124 lb 6.4 oz (56.427 kg)  06/18/15 127 lb 9.6 oz (57.879 kg)  04/18/15 129 lb 11.2 oz (58.832 kg)   BP Readings from Last 3 Encounters:  10/10/15 134/62  09/24/15 150/75  06/18/15 134/66   Pulse Readings from Last 3 Encounters:  09/24/15 58  06/18/15 52  04/18/15 56    Renal function: CrCl cannot be calculated (Unknown ideal weight.).  Past Medical History  Diagnosis Date  . HTN (hypertension)   . CAD (coronary artery disease)     last cath 08/2006  . Hyperlipidemia   . Asthma   . Hearing loss   . DJD (degenerative joint disease), lumbar   . Vision loss of right eye   . Carotid artery disease (HCC)     moderate left ICA stenosis by 2.6 ultrasound     Current Outpatient Prescriptions on File Prior to Visit  Medication Sig Dispense Refill  . albuterol (PROVENTIL HFA;VENTOLIN HFA) 108 (90 BASE) MCG/ACT inhaler Inhale 1-2 puffs into the lungs every 12 (twelve) hours as needed for wheezing or shortness of breath.     . aMarland KitchenLODipine (NORVASC) 5 MG tablet Take 1.25 mg by mouth daily. 1/4 tablet daily    . beclomethasone (QVAR) 80 MCG/ACT inhaler Inhale 2 puffs into the lungs 2 (two) times daily.    . BMarland KitchenOTIN FORTE PO Take 1 capsule by mouth daily.    . cholecalciferol (VITAMIN D) 1000 UNITS tablet Take 1,000 Units by mouth daily.    . clobetasol (OLUX) 0.05 % topical foam Apply 1 application topically every 3 (three) days.    . cloNIDine (CATAPRES) 0.1 MG tablet Take 0.1 mg by mouth daily as needed (high BP > 170).     . Coenzyme Q10 (CO Q-10) 100 MG CAPS Take 100 mg by mouth daily.     . Cranberry-Vitamin C-Probiotic (AZO CRANBERRY) 250-30 MG TABS Take 250 mg by mouth daily.    . EMarland KitchenHINACEA-ZINC-VITAMIN C PO Take 1 tablet by mouth daily.    .[name]  hydrocortisone 2.5 % cream Apply 1 application topically 2 (two) times daily as needed (skin irritation).     . metaxalone (SKELAXIN) 800 MG tablet Take 400 mg by mouth 4 (four) times daily as needed for muscle spasms.     . Multiple Vitamin (MULTIVITAMIN) capsule Take 1 capsule by mouth daily.    . rosuvastatin (CRESTOR) 5 MG tablet Take 5 mg by mouth every other day. On Mondays, Wednesdays, and Saturdays     No current facility-administered medications on file prior to visit.    Allergies  Allergen Reactions  . Latex Other (See Comments)    Irritates her skin  . Statins     Unable to tolerate statins w the exception of crestor.  . Sulfa Antibiotics Other (See Comments)    "didnt feel good"  . Sulfamethoxazole Other (See Comments)    Makes her feel bad  . Zetia [Ezetimibe] Other (See Comments)    "makes me constipated"  . Penicillins Rash   BP 124/76 HR 52  Tommy Medal PharmD CPP [facility]  11/14/2015 7:50 PM

## 2015-12-19 ENCOUNTER — Ambulatory Visit: Payer: Medicare Other

## 2015-12-19 DIAGNOSIS — H47011 Ischemic optic neuropathy, right eye: Secondary | ICD-10-CM | POA: Diagnosis not present

## 2015-12-19 DIAGNOSIS — H35363 Drusen (degenerative) of macula, bilateral: Secondary | ICD-10-CM | POA: Diagnosis not present

## 2015-12-19 DIAGNOSIS — H40012 Open angle with borderline findings, low risk, left eye: Secondary | ICD-10-CM | POA: Diagnosis not present

## 2015-12-19 DIAGNOSIS — H401212 Low-tension glaucoma, right eye, moderate stage: Secondary | ICD-10-CM | POA: Diagnosis not present

## 2015-12-23 ENCOUNTER — Other Ambulatory Visit: Payer: Self-pay | Admitting: Family Medicine

## 2015-12-23 DIAGNOSIS — Z1231 Encounter for screening mammogram for malignant neoplasm of breast: Secondary | ICD-10-CM

## 2015-12-24 ENCOUNTER — Ambulatory Visit (INDEPENDENT_AMBULATORY_CARE_PROVIDER_SITE_OTHER): Payer: Medicare Other | Admitting: Pharmacist

## 2015-12-24 ENCOUNTER — Encounter: Payer: Self-pay | Admitting: Pharmacist

## 2015-12-24 VITALS — BP 144/68 | HR 51 | Ht 65.5 in | Wt 128.0 lb

## 2015-12-24 DIAGNOSIS — I2510 Atherosclerotic heart disease of native coronary artery without angina pectoris: Secondary | ICD-10-CM

## 2015-12-24 DIAGNOSIS — I10 Essential (primary) hypertension: Secondary | ICD-10-CM | POA: Diagnosis not present

## 2015-12-24 NOTE — Patient Instructions (Signed)
Return for a a follow up appointment in 1 month  Your blood pressure today is 144/68   Check your blood pressure at home daily (if able) and keep record of the readings.  Take your BP meds as follows: Continue losartan 50mg  twice daily Hydralazine 12.5mg  three times a day Amlodipine 1.25mg  daily in the EVENING Clonidine 0.1mg  prn BP XX123456 systolic   Bring all of your meds, your BP cuff and your record of home blood pressures to your next appointment.  Exercise as you're able, try to walk approximately 30 minutes per day.  Keep salt intake to a minimum, especially watch canned and prepared boxed foods.  Eat more fresh fruits and vegetables and fewer canned items.  Avoid eating in fast food restaurants.    HOW TO TAKE YOUR BLOOD PRESSURE: . Rest 5 minutes before taking your blood pressure. .  Don't smoke or drink caffeinated beverages for at least 30 minutes before. . Take your blood pressure before (not after) you eat. . Sit comfortably with your back supported and both feet on the floor (don't cross your legs). . Elevate your arm to heart level on a table or a desk. . Use the proper sized cuff. It should fit smoothly and snugly around your bare upper arm. There should be enough room to slip a fingertip under the cuff. The bottom edge of the cuff should be 1 inch above the crease of the elbow. . Ideally, take 3 measurements at one sitting and record the average.

## 2015-12-24 NOTE — Progress Notes (Signed)
Patient ID: Kendra [name]                 DOB: July 19, 1935                      MRN: [id number]     HPI: Kendra [name] is a 80 y.o. female patient of Dr. Gwenlyn Found who presents for hypertension follow up. At her last visit about a month ago her losartan was changed to valsartan with hopes to allow her to come off hydralazine.   She has stopped the valsartan yesterday due to side effects listed below. She is wishing to go back on her losartan and hydralazine (which she began this morning). She feels the side effects she has been experiencing with these medications are more tolerable than those with valsartan.   She states she has been experiencing more blood pressure spikes and has required several doses of clonidine since stopping losartan in the last 2 weeks she has taken clonidine 5 times. She has been using clonidine 0.05 mg (1/2 tablet) if her pressure has been >150 and 1 tablet if her pressure is >160 or she feels symptoms (Headache at night).   Current HTN meds:   Amlodipine 1.49m daily - any more causes LLE Valsartan 1669mdaily Clonidine 0.36m42mRN BP >17>333stolic  Previously tried:  Valsartan - reportedly caused more BP spiking, vision changes (closer vision has been getting blurry), headaches, excessive tiredness, and heart rate to run low.  She was unable to name specific medicaitons, but [place]   Family History: not significant   Social History: previous smoker (quit 48 years ago)  Diet: does not add salt; vegetarian diet  Exercise: tai chi three times a week, walks most days of the week  Home BP readings:  In last 2 weeks has had 4 measurements >15>832stolic.   Wt Readings from Last 3 Encounters:  11/14/15 128 lb 12.8 oz (58.423 kg)  09/24/15 124 lb 6.4 oz (56.427 kg)  06/18/15 127 lb 9.6 oz (57.879 kg)   BP Readings from Last 3 Encounters:  11/14/15 124/76  10/10/15 134/62  09/24/15 150/75    Pulse Readings from Last 3 Encounters:  11/14/15 52  09/24/15 58  06/18/15 52    Renal function: CrCl cannot be calculated (Unknown ideal weight.).  Past Medical History  Diagnosis Date  . HTN (hypertension)   . CAD (coronary artery disease)     last cath 08/2006  . Hyperlipidemia   . Asthma   . Hearing loss   . DJD (degenerative joint disease), lumbar   . Vision loss of right eye   . Carotid artery disease (HCC)     moderate left ICA stenosis by 2.6 ultrasound    Current Outpatient Prescriptions on File Prior to Visit  Medication Sig Dispense Refill  . albuterol (PROVENTIL HFA;VENTOLIN HFA) 108 (90 BASE) MCG/ACT inhaler Inhale 1-2 puffs into the lungs every 12 (twelve) hours as needed for wheezing or shortness of breath.     . aMarland KitchenLODipine (NORVASC) 5 MG tablet Take 1.25 mg by mouth daily. 1/4 tablet daily    . beclomethasone (QVAR) 80 MCG/ACT inhaler Inhale 2 puffs into the lungs 2 (two) times daily.    . BMarland KitchenOTIN FORTE PO Take 1 capsule by mouth daily.    . cholecalciferol (VITAMIN D) 1000 UNITS tablet Take 1,000 Units by mouth daily.    . clobetasol (OLUX)  0.05 % topical foam Apply 1 application topically every 3 (three) days.    . cloNIDine (CATAPRES) 0.1 MG tablet Take 0.1 mg by mouth daily as needed (high BP > 170).     . Coenzyme Q10 (CO Q-10) 100 MG CAPS Take 100 mg by mouth daily.     . Cranberry-Vitamin C-Probiotic (AZO CRANBERRY) 250-30 MG TABS Take 250 mg by mouth daily.    [name] ECHINACEA-ZINC-VITAMIN C PO Take 1 tablet by mouth daily.    . hydrocortisone 2.5 % cream Apply 1 application topically 2 (two) times daily as needed (skin irritation).     . metaxalone (SKELAXIN) 800 MG tablet Take 400 mg by mouth 4 (four) times daily as needed for muscle spasms.     . Multiple Vitamin (MULTIVITAMIN) capsule Take 1 capsule by mouth daily.    . rosuvastatin (CRESTOR) 5 MG tablet Take 5 mg by mouth every other day. On Mondays, Wednesdays, and Saturdays    . valsartan (DIOVAN)  160 MG tablet Take 1 tablet (160 mg total) by mouth daily. 30 tablet 5   No current facility-administered medications on file prior to visit.    Allergies  Allergen Reactions  . Latex Other (See Comments)    Irritates her skin  . Statins     Unable to tolerate statins w the exception of crestor.  . Sulfa Antibiotics Other (See Comments)    "didnt feel good"  . Sulfamethoxazole Other (See Comments)    Makes her feel bad  . Zetia [Ezetimibe] Other (See Comments)    "makes me constipated"  . Penicillins Rash     Assessment/Plan: Hypertension: BP is at goal <150/90 today but she has required several doses of clonidine over the last week. Will have her continue losartan 66m BID and hydralazine 12.535mTID and remain off valsartan. Have also asked she take amlodipine in the evening since she has been waking with headaches. Continue to monitor BP and follow up in hypertension clinic in 1 month.   Thank you, KeLelan PonsAuPatterson [facility]  12/24/2015 3:29 PM

## 2016-01-28 ENCOUNTER — Ambulatory Visit
Admission: RE | Admit: 2016-01-28 | Discharge: 2016-01-28 | Disposition: A | Discharge status: Home / Self Care | Payer: Medicare Other | Source: Ambulatory Visit | Attending: Family Medicine | Admitting: Family Medicine

## 2016-01-28 DIAGNOSIS — Z1231 Encounter for screening mammogram for malignant neoplasm of breast: Secondary | ICD-10-CM | POA: Diagnosis not present

## 2016-01-30 ENCOUNTER — Encounter: Payer: Self-pay | Admitting: Pharmacist

## 2016-01-30 ENCOUNTER — Ambulatory Visit (INDEPENDENT_AMBULATORY_CARE_PROVIDER_SITE_OTHER): Payer: Medicare Other | Admitting: Pharmacist

## 2016-01-30 VITALS — BP 158/78 | HR 47 | Ht 65.5 in | Wt 124.0 lb

## 2016-01-30 DIAGNOSIS — I10 Essential (primary) hypertension: Secondary | ICD-10-CM

## 2016-01-30 DIAGNOSIS — I2510 Atherosclerotic heart disease of native coronary artery without angina pectoris: Secondary | ICD-10-CM

## 2016-01-30 DIAGNOSIS — B369 Superficial mycosis, unspecified: Secondary | ICD-10-CM | POA: Diagnosis not present

## 2016-01-30 MED ORDER — OLMESARTAN MEDOXOMIL 40 MG PO TABS: 40.0000 mg | ORAL_TABLET | Freq: Every day | ORAL | 1 refills | Status: DC

## 2016-01-30 NOTE — Progress Notes (Signed)
Patient ID: Kendra [name]                 DOB: Dec 16, 1935                      MRN: [id number]     HPI: Kendra [name] is a 80 y.o. female patient of Dr. Gwenlyn [name] with [facility] below who presents today for hypertension follow-up. At her last visit she changed back to losartan from valsartan due to side effects (spikes in blood pressures and visual changes).   Today she presents requesting additional blood pressure control. She reports using clonidine several times since our last visit. She has taken 1 tablet twice and a 1/2 tablet once. She states she takes this more based on how she feels rather than her blood pressure readings.   She read reviews of valsartan on WebMD and states that she would like to try the brand name medication because per those reviews it seemed to do better for patients. We discussed that this would likely require a PA and would still be significantly more expensive for her. She is willing to try olmesartan or irbesartan instead.   She also recently bought a new wrist cuff and brought this and her current arm cuff to this visit.   Cardiac Hx: CAD, HTN, HLD  Current HTN meds:  Amlodipine 1.468m in the evening Losartan 550mtwice daily Clonidine 0.68m58mRN BP >17>023stolic  Previously tried:  Valsartan - reportedly caused more BP spiking, vision changes (closer vision has been getting blurry), headaches, excessive tiredness, and heart rate to run low. She also reports today that she has previously tried a diuretic and it caused her potassium and sodium to get "all out of whack"  She was unable to name specific medicaitons, but states that [place]  Family History: not significant   Social History: previous smoker (quit 48 years ago)  Diet: does not add salt; vegetarian diet  Exercise: tai chi three times a week, walks most days of the week  Home BP readings:  Readings with avg 120-130s/60s - 2  measurements >150 - though these were all with her arm cuff that measured about 18 mmHg less than my measurement in office today. Her wrist cuff appears to be measuring appropriately  Wrist cuff - 157/80, 159/75 Arm cuff - 140/65, 139/64 My measurement - 158/78   Wt Readings from Last 3 Encounters:  01/30/16 124 lb (56.2 kg)  12/24/15 128 lb (58.1 kg)  11/14/15 128 lb 12.8 oz (58.4 kg)   BP Readings from Last 3 Encounters:  01/30/16 (!) 158/78  12/24/15 (!) 144/68  11/14/15 124/76   Pulse Readings from Last 3 Encounters:  01/30/16 (!) 47  12/24/15 (!) 51  11/14/15 (!) 52    Renal function: CrCl cannot be calculated (Patient's most recent lab result is older than the maximum 21 days allowed.).  Past Medical History:  Diagnosis Date  . Asthma   . CAD (coronary artery disease)    last cath 08/2006  . Carotid artery disease (HCC)    moderate left ICA stenosis by 2.6 ultrasound  . DJD (degenerative joint disease), lumbar   . Hearing loss   . HTN (hypertension)   . Hyperlipidemia   . Vision loss of right eye     Current Outpatient Prescriptions on File Prior to Visit  Medication Sig Dispense Refill  . albuterol (PROVENTIL HFA;VENTOLIN HFA) 108 (  90 BASE) MCG/ACT inhaler Inhale 1-2 puffs into the lungs every 12 (twelve) hours as needed for wheezing or shortness of breath.     [name] amLODipine (NORVASC) 5 MG tablet Take 1.25 mg by mouth daily. 1/4 tablet daily    . beclomethasone (QVAR) 80 MCG/ACT inhaler Inhale 2 puffs into the lungs 2 (two) times daily.    [name] BIOTIN FORTE PO Take 1 capsule by mouth daily.    . cholecalciferol (VITAMIN D) 1000 UNITS tablet Take 1,000 Units by mouth daily.    . clobetasol (OLUX) 0.05 % topical foam Apply 1 application topically every 3 (three) days.    . cloNIDine (CATAPRES) 0.1 MG tablet Take 0.1 mg by mouth daily as needed (high BP > 170).     . Coenzyme Q10 (CO Q-10) 100 MG CAPS Take 100 mg by mouth daily.     . Cranberry-Vitamin C-Probiotic (AZO  CRANBERRY) 250-30 MG TABS Take 250 mg by mouth daily.    [name] ECHINACEA-ZINC-VITAMIN C PO Take 1 tablet by mouth daily.    . hydrALAZINE (APRESOLINE) 25 MG tablet Take 0.5 tablets (12.5 mg total) by mouth 3 (three) times daily. 135 tablet 2  . hydrocortisone 2.5 % cream Apply 1 application topically 2 (two) times daily as needed (skin irritation). Reported on 12/24/2015    . losartan (COZAAR) 50 MG tablet Take 1 tablet (50 mg total) by mouth 2 (two) times daily. 180 tablet 2  . metaxalone (SKELAXIN) 800 MG tablet Take 400 mg by mouth 4 (four) times daily as needed for muscle spasms.     . Multiple Vitamin (MULTIVITAMIN) capsule Take 1 capsule by mouth daily.    . rosuvastatin (CRESTOR) 5 MG tablet Take 5 mg by mouth every other day. On Mondays, Wednesdays, and Saturdays     No current facility-administered medications on file prior to visit.     Allergies  Allergen Reactions  . Latex Other (See Comments)    Irritates her skin  . Statins     Unable to tolerate statins w the exception of crestor.  . Sulfa Antibiotics Other (See Comments)    "didnt feel good"  . Sulfamethoxazole Other (See Comments)    Makes her feel bad  . Zetia [Ezetimibe] Other (See Comments)    "makes me constipated"  . Penicillins Rash    Blood pressure (!) 158/78, pulse (!) 47, weight 124 lb (56.2 kg).   Assessment/Plan: Hypertension: BP is not controlled today. Given her complicated history with medications, we are somewhat limited with potential options. Since she is wanting to try another ARB, will have her try olmesartan 7m daily for better blood pressure control. Follow up in hypertension clinic in 3 weeks.    Thank you, KLelan Pons APatterson Hammersmith [facility]  01/30/2016 1:51 PM

## 2016-01-30 NOTE — Patient Instructions (Addendum)
Return for a a follow up appointment in 3 weeks  Check your blood pressure at home daily (if able) and keep record of the readings.  Take your BP meds as follows: Stop losartan  Start taking olmesartan 40mg  once daily [phone] Bring all of your meds, your BP cuff and your record of home blood pressures to your next appointment.  Exercise as you're able, try to walk approximately 30 minutes per day.  Keep salt intake to a minimum, especially watch canned and prepared boxed foods.  Eat more fresh fruits and vegetables and fewer canned items.  Avoid eating in fast food restaurants.    HOW TO TAKE YOUR BLOOD PRESSURE: . Rest 5 minutes before taking your blood pressure. .  Don't smoke or drink caffeinated beverages for at least 30 minutes before. . Take your blood pressure before (not after) you eat. . Sit comfortably with your back supported and both feet on the floor (don't cross your legs). . Elevate your arm to heart level on a table or a desk. . Use the proper sized cuff. It should fit smoothly and snugly around your bare upper arm. There should be enough room to slip a fingertip under the cuff. The bottom edge of the cuff should be 1 inch above the crease of the elbow. . Ideally, take 3 measurements at one sitting and record the average.

## 2016-01-31 ENCOUNTER — Other Ambulatory Visit: Payer: Self-pay | Admitting: Pharmacist Clinician (PhC)/ Clinical Pharmacy Specialist

## 2016-01-31 MED ORDER — OLMESARTAN MEDOXOMIL 40 MG PO TABS: 40.0000 mg | ORAL_TABLET | Freq: Every day | ORAL | 1 refills | Status: DC

## 2016-02-06 ENCOUNTER — Other Ambulatory Visit: Payer: Self-pay | Admitting: Cardiovascular Disease

## 2016-02-06 MED ORDER — OLMESARTAN MEDOXOMIL 40 MG PO TABS: 40.0000 mg | ORAL_TABLET | Freq: Every day | ORAL | 1 refills | Status: DC

## 2016-02-11 ENCOUNTER — Telehealth: Payer: Self-pay | Admitting: *Deleted

## 2016-02-11 NOTE — Telephone Encounter (Signed)
Called patient, she was not home. Spoke with her husband, ok per DPR. Let him know the medication was approved through the end of the year and that the patient could pick it up at the pharmacy. He verbalized understanding.

## 2016-02-11 NOTE — Telephone Encounter (Signed)
Received incoming fax from [facility] that patient Olmesartan medoxomil was approved coverage from 06/07/15 TO 06/07/2016.

## 2016-02-13 ENCOUNTER — Telehealth: Payer: Self-pay | Admitting: Cardiovascular Disease

## 2016-02-13 DIAGNOSIS — I10 Essential (primary) hypertension: Secondary | ICD-10-CM | POA: Diagnosis not present

## 2016-02-13 DIAGNOSIS — R195 Other fecal abnormalities: Secondary | ICD-10-CM | POA: Diagnosis not present

## 2016-02-13 MED ORDER — OLMESARTAN MEDOXOMIL 40 MG PO TABS: 40.0000 mg | ORAL_TABLET | Freq: Every day | ORAL | 1 refills | Status: DC

## 2016-02-13 NOTE — Telephone Encounter (Signed)
Spoke to pt. She states that $140 was when insurance did not cover medication. She states that pharmacy did not process after PA approved and they no longer have prescription on file. RX resent.   She will remain on hydralazine for time being. If BP is better controlled we can look at discontinuing hydralazine as patient requests.   She states she understands and appreciates help.

## 2016-02-13 NOTE — Telephone Encounter (Signed)
New Message  Pt call requesting to speak with RN. Pt states she went to pharmacy to pick up meds and prescription was not received. Please call back to discuss

## 2016-02-13 NOTE — Telephone Encounter (Signed)
Returned call to patient-pt reports her rx for olmesartan was not received by the pharmacy.  Also unsure if she is suppose to be taking hydralazine or not.    Called pharmacy-reports they received rx but pt declined medication d/t price~140$ month with insurance.  Also, per BP check with pharmacist on 11/14/15-pt requested to stop hydralazine-therefore other medication changes were made.    Will route to pharmacist for clarification and further recommendations.    BP check appt 9/14 with [facility].

## 2016-02-14 DIAGNOSIS — R195 Other fecal abnormalities: Secondary | ICD-10-CM | POA: Diagnosis not present

## 2016-02-20 ENCOUNTER — Ambulatory Visit: Payer: Medicare Other

## 2016-02-28 ENCOUNTER — Ambulatory Visit (INDEPENDENT_AMBULATORY_CARE_PROVIDER_SITE_OTHER): Payer: Medicare Other | Admitting: Pharmacist

## 2016-02-28 VITALS — BP 126/62 | HR 57

## 2016-02-28 DIAGNOSIS — I2510 Atherosclerotic heart disease of native coronary artery without angina pectoris: Secondary | ICD-10-CM | POA: Diagnosis not present

## 2016-02-28 DIAGNOSIS — I2583 Coronary atherosclerosis due to lipid rich plaque: Principal | ICD-10-CM

## 2016-02-28 NOTE — Progress Notes (Signed)
Patient ID: Kendra [name]                 DOB: 12/19/1935                      MRN: [id number]     HPI: Kendra [name] is a 80 y.o. female patient of Dr. Gwenlyn Found with [place] [facility] below who presents today for hypertension follow up. She has a long history of hypertension and has been intolerant to a long list of medications (many of which she tried while she lived in [place]). She recently was changed from losartan to olmesartan. She has a goal of coming completely off of hydralazine.   She reports that she believes the change to olmesartan was an extremely positive one. She states that she has more energy during the day and seems to be sleeping better at night.   She reports using clonidine only once since making the change to olmesartan and it was during the first day or so after the change.    Cardiac Hx: CAD, HTN, HLD  Current HTN meds:  Amlodipine 1.17m in the evening Olmesartan 474mdaily Hydralazine 12.56m156mID Clonidine 0.1mg14mN BP >170>546tolic  Previously tried: Valsartan - reportedly caused more BP spiking, vision changes (closer vision has been getting blurry), headaches, excessive tiredness, and heart rate to run low. She also reports today that she has previously tried a diuretic and it caused her potassium and sodium to get "all out of whack"  She was unable to name specific medicaitons, but states that [place]150[place]90  Family History: not significant   Social History: previous smoker (quit 48 years ago)  Diet: does not add salt; vegetarian diet  Exercise: tai chi three times a week, walks most days of the week  Home BP readings:  She did not bring her log with her today, but reports that besides once or twice her pressures have been in the 130s/60s occasionally dipping into the 120s. Overall she reports her pressures are running lower than previously and she has seen fewer spikes in her pressure.    Wt Readings  from Last 3 Encounters:  01/30/16 124 lb (56.2 kg)  12/24/15 128 lb (58.1 kg)  11/14/15 128 lb 12.8 oz (58.4 kg)   BP Readings from Last 3 Encounters:  02/28/16 126/62  01/30/16 (!) 158/78  12/24/15 (!) 144/68   Pulse Readings from Last 3 Encounters:  02/28/16 (!) 57  01/30/16 (!) 47  12/24/15 (!) 51    Renal function: CrCl cannot be calculated (Unknown ideal weight.).  Past Medical History:  Diagnosis Date  . Asthma   . CAD (coronary artery disease)    last cath 08/2006  . Carotid artery disease (HCC)    moderate left ICA stenosis by 2.6 ultrasound  . DJD (degenerative joint disease), lumbar   . Hearing loss   . HTN (hypertension)   . Hyperlipidemia   . Vision loss of right eye     Current Outpatient Prescriptions on File Prior to Visit  Medication Sig Dispense Refill  . albuterol (PROVENTIL HFA;VENTOLIN HFA) 108 (90 BASE) MCG/ACT inhaler Inhale 1-2 puffs into the lungs every 12 (twelve) hours as needed for wheezing or shortness of breath.     . amMarland KitchenODipine (NORVASC) 5 MG tablet Take 1.25 mg by mouth daily. 1/4 tablet daily    . beclomethasone (QVAR) 80 MCG/ACT inhaler Inhale 2 puffs into the lungs  2 (two) times daily.    [name] BIOTIN FORTE PO Take 1 capsule by mouth daily.    . calcium carbonate (OS-CAL - DOSED IN MG OF ELEMENTAL CALCIUM) 1250 (500 Ca) MG tablet Take 1 tablet by mouth daily.    . cholecalciferol (VITAMIN D) 1000 UNITS tablet Take 1,000 Units by mouth daily.    . clobetasol (OLUX) 0.05 % topical foam Apply 1 application topically every 3 (three) days.    . Coenzyme Q10 (CO Q-10) 100 MG CAPS Take 100 mg by mouth daily.     . Cranberry-Vitamin C-Probiotic (AZO CRANBERRY) 250-30 MG TABS Take 250 mg by mouth daily.    [name] ECHINACEA-ZINC-VITAMIN C PO Take 1 tablet by mouth daily.    . hydrALAZINE (APRESOLINE) 25 MG tablet Take 12.5 mg by mouth 2 (two) times daily. 135 tablet 2  . hydrocortisone 2.5 % cream Apply 1 application topically 2 (two) times daily as needed  (skin irritation). Reported on 12/24/2015    . metaxalone (SKELAXIN) 800 MG tablet Take 400 mg by mouth 4 (four) times daily as needed for muscle spasms.     . Multiple Vitamin (MULTIVITAMIN) capsule Take 1 capsule by mouth daily.    [name] olmesartan (BENICAR) 40 MG tablet Take 1 tablet (40 mg total) by mouth daily. 30 tablet 1  . rosuvastatin (CRESTOR) 5 MG tablet Take 5 mg by mouth every other day. On Mondays, Wednesdays, and Saturdays    . cloNIDine (CATAPRES) 0.1 MG tablet Take 0.1 mg by mouth daily as needed (high BP > 170).      No current facility-administered medications on file prior to visit.     Allergies  Allergen Reactions  . Latex Other (See Comments)    Irritates her skin  . Statins     Unable to tolerate statins w the exception of crestor.  . Sulfa Antibiotics Other (See Comments)    "didnt feel good"  . Sulfamethoxazole Other (See Comments)    Makes her feel bad  . Zetia [Ezetimibe] Other (See Comments)    "makes me constipated"  . Penicillins Rash    Blood pressure 126/62, pulse (!) 57.   Assessment/Plan: Hypertension: BP is at goal today and improved on olmesartan. Since she has a goal of coming off hydralazine will have her taper her dosage to BID. Continue to monitor pressures and follow up in 3 weeks for further adjustments.    Thank you, Lelan Pons. Patterson Hammersmith, [place]

## 2016-02-28 NOTE — Patient Instructions (Addendum)
Return for a follow up appointment in 3 weeks  Check your blood pressure at home daily (if able) and keep record of the readings.  Take your BP meds as follows: Decrease hydralazine to 12.5mg  twice daily  Bring all of your meds, your BP cuff and your record of home blood pressures to your next appointment.  Exercise as you're able, try to walk approximately 30 minutes per day.  Keep salt intake to a minimum, especially watch canned and prepared boxed foods.  Eat more fresh fruits and vegetables and fewer canned items.  Avoid eating in fast food restaurants.    HOW TO TAKE YOUR BLOOD PRESSURE: . Rest 5 minutes before taking your blood pressure. .  Don't smoke or drink caffeinated beverages for at least 30 minutes before. . Take your blood pressure before (not after) you eat. . Sit comfortably with your back supported and both feet on the floor (don't cross your legs). . Elevate your arm to heart level on a table or a desk. . Use the proper sized cuff. It should fit smoothly and snugly around your bare upper arm. There should be enough room to slip a fingertip under the cuff. The bottom edge of the cuff should be 1 inch above the crease of the elbow. . Ideally, take 3 measurements at one sitting and record the average.

## 2016-03-02 ENCOUNTER — Encounter: Payer: Self-pay | Admitting: Pharmacist

## 2016-03-04 DIAGNOSIS — R194 Change in bowel habit: Secondary | ICD-10-CM | POA: Diagnosis not present

## 2016-03-05 DIAGNOSIS — I6522 Occlusion and stenosis of left carotid artery: Secondary | ICD-10-CM | POA: Diagnosis not present

## 2016-03-05 DIAGNOSIS — M791 Myalgia: Secondary | ICD-10-CM | POA: Diagnosis not present

## 2016-03-05 DIAGNOSIS — E782 Mixed hyperlipidemia: Secondary | ICD-10-CM | POA: Diagnosis not present

## 2016-03-05 DIAGNOSIS — I2510 Atherosclerotic heart disease of native coronary artery without angina pectoris: Secondary | ICD-10-CM | POA: Diagnosis not present

## 2016-03-05 DIAGNOSIS — R739 Hyperglycemia, unspecified: Secondary | ICD-10-CM | POA: Diagnosis not present

## 2016-03-05 DIAGNOSIS — I10 Essential (primary) hypertension: Secondary | ICD-10-CM | POA: Diagnosis not present

## 2016-03-06 ENCOUNTER — Telehealth: Payer: Self-pay | Admitting: Pharmacist Clinician (PhC)/ Clinical Pharmacy Specialist

## 2016-03-06 NOTE — Telephone Encounter (Signed)
Spoke with patient, she has noticed worsening joint pain in past week or two and believes it to be caused by olmesartan 40 mg.  She has decided to cut tablet in half, taking 20 mg bid for the next week to see if this will ameliorate her symptoms.    Reviewed her med list.  She also takes rosuvastatin 5 mg three days per week.   I recommended that she cut that out completely for a week to 10 days to see if this was actually the problem.  She would prefer to try the olmesartan 1/2 tabs for a few days to see if any improvement.  If not, she will continue with that and stop the rosuvastatin.  Also needed to re-schedule her follow up with [place].  That appointment was changed.

## 2016-03-10 DIAGNOSIS — I6522 Occlusion and stenosis of left carotid artery: Secondary | ICD-10-CM | POA: Diagnosis not present

## 2016-03-10 DIAGNOSIS — I2510 Atherosclerotic heart disease of native coronary artery without angina pectoris: Secondary | ICD-10-CM | POA: Diagnosis not present

## 2016-03-10 DIAGNOSIS — I10 Essential (primary) hypertension: Secondary | ICD-10-CM | POA: Diagnosis not present

## 2016-03-10 DIAGNOSIS — R739 Hyperglycemia, unspecified: Secondary | ICD-10-CM | POA: Diagnosis not present

## 2016-03-10 DIAGNOSIS — E782 Mixed hyperlipidemia: Secondary | ICD-10-CM | POA: Diagnosis not present

## 2016-03-10 DIAGNOSIS — Z23 Encounter for immunization: Secondary | ICD-10-CM | POA: Diagnosis not present

## 2016-03-10 DIAGNOSIS — M791 Myalgia: Secondary | ICD-10-CM | POA: Diagnosis not present

## 2016-03-19 ENCOUNTER — Other Ambulatory Visit: Payer: Self-pay

## 2016-03-19 ENCOUNTER — Other Ambulatory Visit: Payer: Self-pay | Admitting: Cardiovascular Disease

## 2016-03-19 MED ORDER — HYDRALAZINE HCL 25 MG PO TABS: 12.5000 mg | ORAL_TABLET | Freq: Three times a day (TID) | ORAL | 6 refills | Status: DC

## 2016-03-24 ENCOUNTER — Other Ambulatory Visit: Payer: Self-pay | Admitting: Cardiovascular Disease

## 2016-03-26 ENCOUNTER — Ambulatory Visit: Payer: Medicare Other

## 2016-03-26 NOTE — Telephone Encounter (Signed)
REFILL 

## 2016-03-31 ENCOUNTER — Ambulatory Visit: Payer: Medicare Other

## 2016-04-03 ENCOUNTER — Ambulatory Visit: Payer: Medicare Other

## 2016-05-09 DIAGNOSIS — J019 Acute sinusitis, unspecified: Secondary | ICD-10-CM | POA: Diagnosis not present

## 2016-05-09 DIAGNOSIS — J069 Acute upper respiratory infection, unspecified: Secondary | ICD-10-CM | POA: Diagnosis not present

## 2016-05-14 DIAGNOSIS — R0981 Nasal congestion: Secondary | ICD-10-CM | POA: Diagnosis not present

## 2016-05-14 DIAGNOSIS — Z9289 Personal history of other medical treatment: Secondary | ICD-10-CM | POA: Diagnosis not present

## 2016-05-20 ENCOUNTER — Telehealth: Payer: Self-pay | Admitting: Cardiovascular Disease

## 2016-05-20 DIAGNOSIS — I739 Peripheral vascular disease, unspecified: Principal | ICD-10-CM

## 2016-05-20 DIAGNOSIS — I779 Disorder of arteries and arterioles, unspecified: Secondary | ICD-10-CM

## 2016-05-20 NOTE — Telephone Encounter (Signed)
New message  Pt call requesting to speak with RN. Pt would like to know if she would need to get a carotid complete or does she need to make an ov with Dr. Gwenlyn Found. Please call back to discuss

## 2016-05-20 NOTE — Telephone Encounter (Signed)
Spoke to patient after reviewing last OV notes and last carotid US. Note that she is set up for routine carotid and this was last done in Oct 2016 w recommended recheck in 12 mos. She had OV w Dr. Gwenlyn Found in April 2017. I've informed her I will make sure she is on recall list for April 2018, and that I will enter order for carotid. She's aware a scheduler will reach out to her to arrange this imaging. She voiced thanks for call.

## 2016-05-21 DIAGNOSIS — H47011 Ischemic optic neuropathy, right eye: Secondary | ICD-10-CM | POA: Diagnosis not present

## 2016-05-21 DIAGNOSIS — H40012 Open angle with borderline findings, low risk, left eye: Secondary | ICD-10-CM | POA: Diagnosis not present

## 2016-05-25 DIAGNOSIS — K5641 Fecal impaction: Secondary | ICD-10-CM | POA: Diagnosis not present

## 2016-06-17 ENCOUNTER — Encounter (HOSPITAL_COMMUNITY): Payer: Medicare Other

## 2016-06-18 ENCOUNTER — Ambulatory Visit (HOSPITAL_COMMUNITY)
Admission: RE | Admit: 2016-06-18 | Discharge: 2016-06-18 | Disposition: A | Discharge status: Home / Self Care | Payer: Medicare Other | Source: Ambulatory Visit | Attending: Cardiology | Admitting: Cardiology

## 2016-06-18 DIAGNOSIS — I779 Disorder of arteries and arterioles, unspecified: Secondary | ICD-10-CM

## 2016-06-18 DIAGNOSIS — I6523 Occlusion and stenosis of bilateral carotid arteries: Secondary | ICD-10-CM | POA: Insufficient documentation

## 2016-06-18 DIAGNOSIS — I739 Peripheral vascular disease, unspecified: Secondary | ICD-10-CM

## 2016-06-19 ENCOUNTER — Other Ambulatory Visit: Payer: Self-pay | Admitting: Cardiovascular Disease

## 2016-06-19 DIAGNOSIS — I739 Peripheral vascular disease, unspecified: Principal | ICD-10-CM

## 2016-06-19 DIAGNOSIS — I779 Disorder of arteries and arterioles, unspecified: Secondary | ICD-10-CM

## 2016-06-19 DIAGNOSIS — J069 Acute upper respiratory infection, unspecified: Secondary | ICD-10-CM | POA: Diagnosis not present

## 2016-06-26 DIAGNOSIS — R062 Wheezing: Secondary | ICD-10-CM | POA: Diagnosis not present

## 2016-06-26 DIAGNOSIS — J4541 Moderate persistent asthma with (acute) exacerbation: Secondary | ICD-10-CM | POA: Diagnosis not present

## 2016-06-26 DIAGNOSIS — R0989 Other specified symptoms and signs involving the circulatory and respiratory systems: Secondary | ICD-10-CM | POA: Diagnosis not present

## 2016-07-09 DIAGNOSIS — I2510 Atherosclerotic heart disease of native coronary artery without angina pectoris: Secondary | ICD-10-CM | POA: Diagnosis not present

## 2016-07-09 DIAGNOSIS — I10 Essential (primary) hypertension: Secondary | ICD-10-CM | POA: Diagnosis not present

## 2016-07-09 DIAGNOSIS — R739 Hyperglycemia, unspecified: Secondary | ICD-10-CM | POA: Diagnosis not present

## 2016-07-09 DIAGNOSIS — E782 Mixed hyperlipidemia: Secondary | ICD-10-CM | POA: Diagnosis not present

## 2016-07-09 DIAGNOSIS — I6522 Occlusion and stenosis of left carotid artery: Secondary | ICD-10-CM | POA: Diagnosis not present

## 2016-08-21 DIAGNOSIS — J0100 Acute maxillary sinusitis, unspecified: Secondary | ICD-10-CM | POA: Diagnosis not present

## 2016-09-22 ENCOUNTER — Encounter: Payer: Self-pay | Admitting: Cardiovascular Disease

## 2016-09-22 ENCOUNTER — Ambulatory Visit (INDEPENDENT_AMBULATORY_CARE_PROVIDER_SITE_OTHER): Payer: Medicare Other | Admitting: Cardiovascular Disease

## 2016-09-22 VITALS — BP 162/60 | HR 51 | Ht 65.0 in | Wt 119.6 lb

## 2016-09-22 DIAGNOSIS — I779 Disorder of arteries and arterioles, unspecified: Secondary | ICD-10-CM

## 2016-09-22 DIAGNOSIS — I2510 Atherosclerotic heart disease of native coronary artery without angina pectoris: Secondary | ICD-10-CM | POA: Diagnosis not present

## 2016-09-22 DIAGNOSIS — E7800 Pure hypercholesterolemia, unspecified: Secondary | ICD-10-CM

## 2016-09-22 DIAGNOSIS — I739 Peripheral vascular disease, unspecified: Secondary | ICD-10-CM

## 2016-09-22 DIAGNOSIS — I10 Essential (primary) hypertension: Secondary | ICD-10-CM | POA: Diagnosis not present

## 2016-09-22 NOTE — Assessment & Plan Note (Signed)
History of hypertension blood pressure measured today at 126/62. She is on amlodipine, clonidine, hydralazine and Benicar. I reviewed her blood pressure log which were reveals well-controlled blood pressures. Continue current meds at current dosing

## 2016-09-22 NOTE — Progress Notes (Signed)
09/22/2016 Kendra [name]   12-May-1936  [id number]  Primary Physician Anthoney Harada, MD Primary Cardiologist: Lorretta Harp MD Kendra [name]  HPI:  Ms. Kendra [name] is a 81 year old mildly overweight married Caucasian female mother of one daughter who recently relocated within the last year or 2 from [place] to be [place] to be closer to her daughter. I last saw her in the office 09/24/15..She self-referred to be established in our practice for ongoing cardiac care. She had previously seen Dr. Candee Furbish. I last saw her in the office 12/15/13. Her risk factors for heart disease are remarkable for treated hypertension, hyper-lipidemia and family history. Her mother did have 2 myocardial infarctions. She has never smoked. She had 2 overlapping stents placed in her LAD in [facility] clinic March 08 has been asymptomatic since. She had negative Myoview one year ago. She has had carotid Dopplers last year and again this year which show mild to moderate left internal carotid artery stenosis. Since I saw her last her only complaints are of nocturnal spikes of her systolic blood pressure up to the 190 range. I have reviewed her blood pressure log which reveals her blood pressure to be well-controlled on her current medications.   Current Outpatient Prescriptions  Medication Sig Dispense Refill  . albuterol (PROVENTIL HFA;VENTOLIN HFA) 108 (90 BASE) MCG/ACT inhaler Inhale 1-2 puffs into the lungs every 12 (twelve) hours as needed for wheezing or shortness of breath.     [name] amLODipine (NORVASC) 5 MG tablet Take 1.25 mg by mouth daily. 1/4 tablet daily    . beclomethasone (QVAR) 80 MCG/ACT inhaler Inhale 2 puffs into the lungs 2 (two) times daily.    [name] BIOTIN FORTE PO Take 1 capsule by mouth daily.    . calcium carbonate (OS-CAL - DOSED IN MG OF ELEMENTAL CALCIUM) 1250 (500 Ca) MG tablet Take 1 tablet by mouth daily.    . cholecalciferol (VITAMIN D) 1000 UNITS tablet Take 1,000  Units by mouth daily.    . clobetasol (OLUX) 0.05 % topical foam Apply 1 application topically every 3 (three) days.    . cloNIDine (CATAPRES) 0.1 MG tablet Take 0.1 mg by mouth daily as needed (high BP > 170).     . Coenzyme Q10 (CO Q-10) 100 MG CAPS Take 100 mg by mouth daily.     . Cranberry-Vitamin C-Probiotic (AZO CRANBERRY) 250-30 MG TABS Take 250 mg by mouth daily.    [name] ECHINACEA-ZINC-VITAMIN C PO Take 1 tablet by mouth daily.    . hydrALAZINE (APRESOLINE) 25 MG tablet Take 1 tablet (25 mg total) by mouth 2 (two) times daily. KEEP OV. 180 tablet 0  . hydrocortisone 2.5 % cream Apply 1 application topically 2 (two) times daily as needed (skin irritation). Reported on 12/24/2015    . metaxalone (SKELAXIN) 800 MG tablet Take 400 mg by mouth 4 (four) times daily as needed for muscle spasms.     . Multiple Vitamin (MULTIVITAMIN) capsule Take 1 capsule by mouth daily.    . rosuvastatin (CRESTOR) 5 MG tablet Take 5 mg by mouth every other day. On Mondays, Wednesdays, and Saturdays     No current facility-administered medications for this visit.     Allergies  Allergen Reactions  . Latex Other (See Comments)    Irritates her skin  . Statins     Unable to tolerate statins w the exception of crestor.  . Sulfa Antibiotics Other (See Comments)    "didnt feel good"  .  Sulfamethoxazole Other (See Comments)    Makes her feel bad  . Zetia [Ezetimibe] Other (See Comments)    "makes me constipated"  . Penicillins Rash    Social History   Social History  . Marital status: Married    Spouse name: N/A  . Number of children: 1  . Years of education: MAx2   Occupational History  . Retired   . Social Worker    Social History Main Topics  . Smoking status: Former Smoker    Quit date: 06/08/1966  . Smokeless tobacco: Never Used  . Alcohol use 0.5 oz/week    1 drink(s) per week     Comment: wine  . Drug use: No  . Sexual activity: Not on file   Other Topics Concern  . Not on file    Social History Narrative  . No narrative on file     Review of Systems: General: negative for chills, fever, night sweats or weight changes.  Cardiovascular: negative for chest pain, dyspnea on exertion, edema, orthopnea, palpitations, paroxysmal nocturnal dyspnea or shortness of breath Dermatological: negative for rash Respiratory: negative for cough or wheezing Urologic: negative for hematuria Abdominal: negative for nausea, vomiting, diarrhea, bright red blood per rectum, melena, or hematemesis Neurologic: negative for visual changes, syncope, or dizziness All other systems reviewed and are otherwise negative except as noted above.    Blood pressure (!) 162/60, pulse (!) 51, height 5\' 5"  (1.651 m), weight 119 lb 9.6 oz (54.3 kg).  General appearance: alert and no distress Neck: no adenopathy, no carotid bruit, no JVD, supple, symmetrical, trachea midline and thyroid not enlarged, symmetric, no tenderness/mass/nodules Lungs: clear to auscultation bilaterally Heart: regular rate and rhythm, S1, S2 normal, no murmur, click, rub or gallop Extremities: extremities normal, atraumatic, no cyanosis or edema  EKG sinus bradycardia 51 without ST or T-wave changes. I personally reviewed this EKG  ASSESSMENT AND PLAN:   Coronary artery disease History of coronary artery disease status post stenting of her LAD at [facility] clinic March 2008 with 2 overlapping stents. Her last Myoview performed 2 years ago was nonischemic. She denies chest pain or shortness of breath.  Essential hypertension History of hypertension blood pressure measured today at 126/62. She is on amlodipine, clonidine, hydralazine and Benicar. I reviewed her blood pressure log which were reveals well-controlled blood pressures. Continue current meds at current dosing  Hyperlipidemia History of hyperlipidemia on statin therapy followed by her PCP  Carotid artery disease History of carotid artery disease with mild to  moderate left ICA stenosis demonstrated most reasonable recently by 2 puffs ultrasound 06/18/16. This is scheduled to be repeated on an annual basis. She is neurologically asymptomatic.      Lorretta Harp MD FACP,FACC,FAHA, [facility] 09/22/2016 10:03 AM

## 2016-09-22 NOTE — Assessment & Plan Note (Signed)
History of carotid artery disease with mild to moderate left ICA stenosis demonstrated most reasonable recently by 2 puffs ultrasound 06/18/16. This is scheduled to be repeated on an annual basis. She is neurologically asymptomatic.

## 2016-09-22 NOTE — Assessment & Plan Note (Signed)
History of coronary artery disease status post stenting of her LAD at [facility] clinic March 2008 with 2 overlapping stents. Her last Myoview performed 2 years ago was nonischemic. She denies chest pain or shortness of breath.

## 2016-09-22 NOTE — Patient Instructions (Signed)

## 2016-09-22 NOTE — Assessment & Plan Note (Signed)
History of hyperlipidemia on statin therapy followed by her PCP. 

## 2016-10-06 DIAGNOSIS — I6522 Occlusion and stenosis of left carotid artery: Secondary | ICD-10-CM | POA: Diagnosis not present

## 2016-10-06 DIAGNOSIS — E782 Mixed hyperlipidemia: Secondary | ICD-10-CM | POA: Diagnosis not present

## 2016-10-06 DIAGNOSIS — I2510 Atherosclerotic heart disease of native coronary artery without angina pectoris: Secondary | ICD-10-CM | POA: Diagnosis not present

## 2016-10-06 DIAGNOSIS — R739 Hyperglycemia, unspecified: Secondary | ICD-10-CM | POA: Diagnosis not present

## 2016-10-06 DIAGNOSIS — J4541 Moderate persistent asthma with (acute) exacerbation: Secondary | ICD-10-CM | POA: Diagnosis not present

## 2016-10-06 DIAGNOSIS — M1710 Unilateral primary osteoarthritis, unspecified knee: Secondary | ICD-10-CM | POA: Diagnosis not present

## 2016-10-06 DIAGNOSIS — I10 Essential (primary) hypertension: Secondary | ICD-10-CM | POA: Diagnosis not present

## 2016-10-06 DIAGNOSIS — Z789 Other specified health status: Secondary | ICD-10-CM | POA: Diagnosis not present

## 2016-10-06 DIAGNOSIS — M25512 Pain in left shoulder: Secondary | ICD-10-CM | POA: Diagnosis not present

## 2016-11-04 DIAGNOSIS — M25561 Pain in right knee: Secondary | ICD-10-CM | POA: Diagnosis not present

## 2016-11-04 DIAGNOSIS — M170 Bilateral primary osteoarthritis of knee: Secondary | ICD-10-CM | POA: Diagnosis not present

## 2016-11-04 DIAGNOSIS — G8929 Other chronic pain: Secondary | ICD-10-CM | POA: Diagnosis not present

## 2016-11-04 DIAGNOSIS — M25562 Pain in left knee: Secondary | ICD-10-CM | POA: Diagnosis not present

## 2016-11-09 DIAGNOSIS — M7989 Other specified soft tissue disorders: Secondary | ICD-10-CM | POA: Diagnosis not present

## 2016-11-09 DIAGNOSIS — M79672 Pain in left foot: Secondary | ICD-10-CM | POA: Diagnosis not present

## 2016-11-16 ENCOUNTER — Encounter: Payer: Self-pay | Admitting: Dietician

## 2016-11-16 ENCOUNTER — Encounter: Payer: Medicare Other | Attending: Family Medicine | Admitting: Dietician

## 2016-11-16 DIAGNOSIS — Z789 Other specified health status: Secondary | ICD-10-CM | POA: Insufficient documentation

## 2016-11-16 DIAGNOSIS — Z713 Dietary counseling and surveillance: Secondary | ICD-10-CM | POA: Diagnosis not present

## 2016-11-16 DIAGNOSIS — E639 Nutritional deficiency, unspecified: Secondary | ICD-10-CM

## 2016-11-16 NOTE — Patient Instructions (Signed)
See the "Power Plate" handout for building a balanced meal. Have a protein choice with each meal (tofu, nuts, soy milk, beans, "vegemeat" etc.) Consider a sublingual or dissolvable vitamin B-12.

## 2016-11-16 NOTE — Progress Notes (Signed)
Medical Nutrition Therapy:  Appt start time: 8938 end time:  1130.   Assessment:  Primary concerns today: Patient is here today alone.  They became vegetarian's with occasional fish and yogurt a couple of years ago.  She and her husband would like to get closer to vegan and wants to be sure to get enough protein as well as what to take to get enough probiotics.  Many family had type 2 diabetes and she is working to avoid this.  Other hx includes high cholesterol and HTN.  Several years ago, she had a stent.  Labs noted:  A!c 5.5% 10/06/16, cholesterol 165, triglycerides 56, HDL 78, LDL 76.   Patient lives with her husband.  They share shopping and cooking.  They moved here about 5 years ago from [place].  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  Weight was high 140's at that time. She gradually lost with modifications in her eating. She has been able to maintain her weight at 124 lbs for the past 2 year. (Highest weight 165 lbs about 20 years ago.).  Today she weight was 124 lbs which was noted to be increased from 119 lbs at her MD office 10/06/17.  Nutrition focused physical exam performed.  She has adequate fat stores.  No obvious micronutrient deficiencies.  Mild-Moderate degree of muscle wasting at scapula.  She does [place] and walks.  Her diet appears to be low in protein at times.  She has recently added tofu to more meals for protein.  Preferred Learning Style:   No preference indicated   Learning Readiness:   Ready   MEDICATIONS: see list   DIETARY INTAKE:  Usual eating pattern includes 3 meals and 2-3 snacks per day.  24-hr recall:  B ( AM): Cereal (Nature's Path or other low sugar, higher in protein) Almond milk, coffee with almond milk OR breakfast bar (9-10 grams protein) with coffee and almond milk Snk ( AM): none  L ( PM): Hummus, mixed greens, avocado at times, cucumbers, peppers, organic bread OR fruit and yogurt (greek) OR bread and  dairy or almond cheese and vegetables OR soup with added beans OR salad with canned beans OR omelet Snk ( PM): coffee with almond milk and fruit, low fat cookies, nuts, or almond butter and crackers D ( PM): Spinach Loaf (spinach, bread crumbs, almond milk, 4 egg whites, 1 egg yolk, seasoning), fruit OR vegan ravioli (mushroom or butternut squash) OR vegan pizza OR pasta or vegetable lasagne AND sometimes will use a vegetarian "meat" product Snk ( PM): nuts Beverages: coffee with almond milk, water, almond milk, hot tea  Usual physical activity: Tai Chi 2 times a week for 1 hour each class, 1 mile walk (20 minutes) most other days weather permitting and walks at [facility].  Estimated energy needs: 1600 calories 60 g protein  Progress Towards Goal(s):  In progress.   Nutritional Diagnosis:  NB-1.1 Food and nutrition-related knowledge deficit As related to balanced vegetarian/vegan diet.  As evidenced by diet hx and patient presentatio.    Intervention:  Nutrition counseling/edication related to healthy plant based eating.  Discussed good vegetarian protein sources.  Discussed Vitamin B-12 and best absorbable source.  Discussed basic meal planning and tips to add protein and other nutrition to each meal.  (At times they would just eat a big sweet potato for dinner.)  Discussed Resource.  Also discussed options for eating out.  See the "Power Plate" handout  for building a balanced meal. Have a protein choice with each meal (tofu, nuts, soy milk, beans, "vegemeat" etc.) Consider a sublingual or dissolvable vitamin B-12.  Teaching Method Utilized:  Visual Auditory Hands on  Handouts given during visit include:  Books to consider reading include Vegan for Her and Vegan for Life by Clint Bolder, RD  The Power Plate  Recipes  [employer] copy  Barriers to learning/adherence to lifestyle change: none  Demonstrated degree of understanding via:  Teach  Back   Monitoring/Evaluation:  Dietary intake, exercise, and body weight prn.

## 2016-12-11 DIAGNOSIS — M5136 Other intervertebral disc degeneration, lumbar region: Secondary | ICD-10-CM | POA: Diagnosis not present

## 2016-12-11 DIAGNOSIS — M1611 Unilateral primary osteoarthritis, right hip: Secondary | ICD-10-CM | POA: Diagnosis not present

## 2016-12-16 DIAGNOSIS — M25561 Pain in right knee: Secondary | ICD-10-CM | POA: Diagnosis not present

## 2016-12-16 DIAGNOSIS — M170 Bilateral primary osteoarthritis of knee: Secondary | ICD-10-CM | POA: Diagnosis not present

## 2016-12-16 DIAGNOSIS — M25562 Pain in left knee: Secondary | ICD-10-CM | POA: Diagnosis not present

## 2016-12-16 DIAGNOSIS — G8929 Other chronic pain: Secondary | ICD-10-CM | POA: Diagnosis not present

## 2016-12-17 DIAGNOSIS — I10 Essential (primary) hypertension: Secondary | ICD-10-CM | POA: Diagnosis not present

## 2016-12-17 DIAGNOSIS — I83893 Varicose veins of bilateral lower extremities with other complications: Secondary | ICD-10-CM | POA: Diagnosis not present

## 2016-12-17 DIAGNOSIS — R739 Hyperglycemia, unspecified: Secondary | ICD-10-CM | POA: Diagnosis not present

## 2016-12-17 DIAGNOSIS — Z789 Other specified health status: Secondary | ICD-10-CM | POA: Diagnosis not present

## 2016-12-17 DIAGNOSIS — I6522 Occlusion and stenosis of left carotid artery: Secondary | ICD-10-CM | POA: Diagnosis not present

## 2016-12-17 DIAGNOSIS — I2510 Atherosclerotic heart disease of native coronary artery without angina pectoris: Secondary | ICD-10-CM | POA: Diagnosis not present

## 2016-12-17 DIAGNOSIS — R600 Localized edema: Secondary | ICD-10-CM | POA: Diagnosis not present

## 2016-12-17 DIAGNOSIS — I872 Venous insufficiency (chronic) (peripheral): Secondary | ICD-10-CM | POA: Diagnosis not present

## 2016-12-17 DIAGNOSIS — E782 Mixed hyperlipidemia: Secondary | ICD-10-CM | POA: Diagnosis not present

## 2016-12-17 DIAGNOSIS — R062 Wheezing: Secondary | ICD-10-CM | POA: Diagnosis not present

## 2016-12-21 ENCOUNTER — Other Ambulatory Visit: Payer: Self-pay | Admitting: Family Medicine

## 2016-12-21 DIAGNOSIS — Z1231 Encounter for screening mammogram for malignant neoplasm of breast: Secondary | ICD-10-CM

## 2016-12-22 DIAGNOSIS — H2513 Age-related nuclear cataract, bilateral: Secondary | ICD-10-CM | POA: Diagnosis not present

## 2016-12-22 DIAGNOSIS — H40013 Open angle with borderline findings, low risk, bilateral: Secondary | ICD-10-CM | POA: Diagnosis not present

## 2016-12-22 DIAGNOSIS — H25013 Cortical age-related cataract, bilateral: Secondary | ICD-10-CM | POA: Diagnosis not present

## 2016-12-22 DIAGNOSIS — H47011 Ischemic optic neuropathy, right eye: Secondary | ICD-10-CM | POA: Diagnosis not present

## 2016-12-30 ENCOUNTER — Ambulatory Visit: Payer: Medicare Other | Attending: Family Medicine | Admitting: Physical Therapy

## 2016-12-30 DIAGNOSIS — M25551 Pain in right hip: Secondary | ICD-10-CM | POA: Insufficient documentation

## 2016-12-30 DIAGNOSIS — M62838 Other muscle spasm: Secondary | ICD-10-CM | POA: Diagnosis not present

## 2016-12-30 DIAGNOSIS — M6281 Muscle weakness (generalized): Secondary | ICD-10-CM | POA: Diagnosis not present

## 2016-12-30 NOTE — Patient Instructions (Signed)
Abduction: Clam (Eccentric) - Side-Lying    Lie on side with knees bent. Lift top knee, keeping feet together. Keep trunk steady. Slowly lower for 3-5 seconds. _20__ reps per set, __1_ sets per day, _7__ days per week.   http://ecce.exer.us/65   Copyright  VHI. All rights reserved.

## 2016-12-30 NOTE — Therapy (Signed)
[facility] [address] [place], [place], [address] Phone: [phone]   Fax:  [phone]  Physical Therapy Evaluation  Patient Details  Name: Kendra [name] MRN: [id number] Date of Birth: Feb 22, 1936 Referring Provider: Rhina Brackett  Encounter Date: 12/30/2016      PT End of Session - 12/30/16 1730    Visit Number 1   Number of Visits 10   Date for PT Re-Evaluation 02/10/17   Authorization Type medicare gcodes   PT Start Time 1102   PT Stop Time 1143   PT Time Calculation (min) 41 min   Activity Tolerance Patient tolerated treatment well      Past Medical History:  Diagnosis Date  . Asthma   . CAD (coronary artery disease)    last cath 08/2006  . Carotid artery disease (HCC)    moderate left ICA stenosis by 2.6 ultrasound  . DJD (degenerative joint disease), lumbar   . Hearing loss   . HTN (hypertension)   . Hyperlipidemia   . Vision loss of right eye     Past Surgical History:  Procedure Laterality Date  . ABDOMINAL HYSTERECTOMY  1975  . APPENDECTOMY    . CORONARY ANGIOPLASTY WITH STENT PLACEMENT  08/2006   taxus stent x2 to LAD, 2.5x19m and 2.5x868[facility]. TONSILLECTOMY  1994    There were no vitals filed for this visit.       Subjective Assessment - 12/30/16 1108    Subjective Pt states about a month ago she started getting pain in hip down thigh.  MD told her she has some arthritis and maybe bursitis in Rt hip.  She states she has severe knee arthritis which bothers her more than anything.  Pt goes to Tai chi 2x/week.     Limitations Walking;Sitting;Other (comment)  up steps, sleeping   Currently in Pain? Yes   Pain Location Hip   Pain Orientation Right   Pain Descriptors / Indicators Burning   Pain Type Acute pain   Pain Radiating Towards down thigh   Pain Onset More than a month ago   Pain  Frequency Intermittent   Aggravating Factors  sleeps on her right side more, sometimes wakes from pain, going up steps   Pain Relieving Factors biofreeze and arnica, metaxalome (muscle relaxer)   Effect of Pain on Daily Activities activities standing on right leg (tai chi)            [facility] - 12/30/16 0001      Assessment   Medical Diagnosis right hip arthritis   Referring Provider Dominic mcKinley   Onset Date/Surgical Date --  one month ago   Prior Therapy No     Precautions   Precautions None     Restrictions   Weight Bearing Restrictions No     Balance Screen   Has the patient fallen in the past 6 months No     [place]   Living Arrangements Spouse/significant other     Prior Function   Level of Independence Independent   Vocation Retired     CoAssociate Professor Overall Cognitive Status Within Functional Limits for tasks assessed     Observation/Other Assessments   Focus on Therapeutic Outcomes (FOTO)  45% limited     Posture/Postural Control   Posture/Postural Control No significant limitations     ROM /  Strength   AROM / PROM / Strength Strength     Strength   Strength Assessment Site Hip   Right/Left Hip Right;Left   Right Hip Extension 4-/5   Right Hip External Rotation  4/5   Right Hip Internal Rotation 4/5   Right Hip ABduction 4-/5   Right Hip ADduction 4/5   Left Hip Extension 4/5   Left Hip External Rotation 4/5   Left Hip Internal Rotation 4/5   Left Hip ABduction 4-/5   Left Hip ADduction 4/5     Palpation   SI assessment  WNL   Palpation comment right greater trochareric bursa tender, rigth gluteal muscles tight and tender     Ambulation/Gait   Gait Pattern Trendelenburg  increased internal rotation of hips            Objective measurements completed on examination: See above findings.          [facility] Adult PT Treatment/Exercise - 12/30/16 0001      Exercises    Exercises Knee/Hip     Knee/Hip Exercises: Sidelying   Clams 20x                PT Education - 12/30/16 1137    Education provided Yes   Education Details clam   Person(s) Educated Patient   Methods Explanation;Handout   Comprehension Verbalized understanding          PT Short Term Goals - 12/30/16 1140      PT SHORT TERM GOAL #1   Title pt will be independent with initial HEP   Time 4   Period Weeks   Status New   Target Date 01/27/17     PT SHORT TERM GOAL #2   Title pt reports 25% less right hip pain with daily activities   Time 4   Period Weeks   Status New   Target Date 01/27/17     PT SHORT TERM GOAL #3   Title able to perform single leg stand 5 seconds without Trendelenburg bilaterally   Time 4   Period Weeks   Status New   Target Date 01/27/17           PT Long Term Goals - 12/30/16 1142      PT LONG TERM GOAL #1   Title FOTO < or = to 35% limited   Baseline 45%   Time 8   Period Weeks   Status New   Target Date 02/24/17     PT LONG TERM GOAL #2   Title pain at least 50% improved in right hip   Time 8   Period Weeks   Status New   Target Date 02/24/17     PT LONG TERM GOAL #3   Title bilateral hip abduction improved to 4+/5 for reduced Trendelenburg in gait   Time 8   Period Weeks   Status New   Target Date 02/24/17     PT LONG TERM GOAL #4   Title independent with advanced HEP   Time 8   Period Weeks   Status New   Target Date 02/24/17                Plan - 12/30/16 1144    Clinical Impression Statement Patient presents to clinic with new onset of right hip pain in the last month.   Patient presents with right hip lower in standing and bilateral Trendelenberb.  Patient appears to have length length discrepancy with right leg shorter than  the left.  Pt has weakness in bilateral LE. Pt has tenderness at greater trochanter and muscle spasms in right gluteal muscles.  Patient will benefit from skilled therapy to  address impairments so she will be able to return to being able to participate in activities of healthy lifestyle.   History and Personal Factors relevant to plan of care: arthritis in bilateral knees and left shoulder, chronic pain   Clinical Presentation Evolving   Clinical Presentation due to: arthritis that has been worsening throughout body and newly onset in right hip   Rehab Potential Excellent   PT Frequency 2x / week   PT Duration 8 weeks   PT Treatment/Interventions ADLs/Self Care Home Management;Biofeedback;Cryotherapy;Electrical Stimulation;Iontophoresis 21m/ml Dexamethasone;Moist Heat;Ultrasound;Gait training;Stair training;Therapeutic activities;Therapeutic exercise;Balance training;Neuromuscular re-education;Manual techniques;Passive range of motion;Dry needling;Taping   PT Next Visit Plan core and hip strengthening, ionto/ultrasound to right bursa (written on referral), manual techniques as needed   Consulted and Agree with Plan of Care Patient      Patient will benefit from skilled therapeutic intervention in order to improve the following deficits and impairments:  Abnormal gait, Decreased range of motion, Difficulty walking, Decreased strength, Pain, Increased muscle spasms, Postural dysfunction  Visit Diagnosis: Pain in right hip  Muscle weakness (generalized)  Other muscle spasm      G-Codes - [id number]    Functional Assessment Tool Used (Outpatient Only) FOTO and clinical impression   Functional Limitation Mobility: Walking and moving around   Mobility: Walking and Moving Around Current Status (([id number] At least 40 percent but less than 60 percent impaired, limited or restricted   Mobility: Walking and Moving Around Goal Status [phone] At least 20 percent but less than 40 percent impaired, limited or restricted       Problem List Patient Active Problem List   Diagnosis Date Noted  . Carotid artery disease ([place] 09/19/2014  . Disturbance of skin sensation  11/17/2013  . Coronary artery disease 03/31/2013  . Essential hypertension 03/31/2013  . Hyperlipidemia 03/31/2013    JWayne Both7August 21, 2018 5:40 PM  [facility] [address] [place] [address]Phone: [phone]  Fax:  [phone] Name: Kendra VetterMRN: [id number] of Birth: 907/24/1937

## 2017-01-05 ENCOUNTER — Ambulatory Visit: Payer: Medicare Other

## 2017-01-05 DIAGNOSIS — I6522 Occlusion and stenosis of left carotid artery: Secondary | ICD-10-CM | POA: Diagnosis not present

## 2017-01-05 DIAGNOSIS — R739 Hyperglycemia, unspecified: Secondary | ICD-10-CM | POA: Diagnosis not present

## 2017-01-05 DIAGNOSIS — M6281 Muscle weakness (generalized): Secondary | ICD-10-CM | POA: Diagnosis not present

## 2017-01-05 DIAGNOSIS — M25551 Pain in right hip: Secondary | ICD-10-CM | POA: Diagnosis not present

## 2017-01-05 DIAGNOSIS — Z789 Other specified health status: Secondary | ICD-10-CM | POA: Diagnosis not present

## 2017-01-05 DIAGNOSIS — R600 Localized edema: Secondary | ICD-10-CM | POA: Diagnosis not present

## 2017-01-05 DIAGNOSIS — R062 Wheezing: Secondary | ICD-10-CM | POA: Diagnosis not present

## 2017-01-05 DIAGNOSIS — I872 Venous insufficiency (chronic) (peripheral): Secondary | ICD-10-CM | POA: Diagnosis not present

## 2017-01-05 DIAGNOSIS — M722 Plantar fascial fibromatosis: Secondary | ICD-10-CM | POA: Diagnosis not present

## 2017-01-05 DIAGNOSIS — I83893 Varicose veins of bilateral lower extremities with other complications: Secondary | ICD-10-CM | POA: Diagnosis not present

## 2017-01-05 DIAGNOSIS — I10 Essential (primary) hypertension: Secondary | ICD-10-CM | POA: Diagnosis not present

## 2017-01-05 DIAGNOSIS — I2510 Atherosclerotic heart disease of native coronary artery without angina pectoris: Secondary | ICD-10-CM | POA: Diagnosis not present

## 2017-01-05 DIAGNOSIS — E782 Mixed hyperlipidemia: Secondary | ICD-10-CM | POA: Diagnosis not present

## 2017-01-05 DIAGNOSIS — M62838 Other muscle spasm: Secondary | ICD-10-CM

## 2017-01-05 NOTE — Therapy (Signed)
[facility] [address], [place], [place], [address] Phone: [phone]   Fax:  [phone]  Physical Therapy Treatment  Patient Details  Name: Kendra [name] MRN: [id number] Date of Birth: 1935-07-29 Referring Provider: Rhina Brackett  Encounter Date: 01/05/2017      PT End of Session - 01/05/17 1042    Visit Number 2   Number of Visits 10   Date for PT Re-Evaluation 02/10/17   Authorization Type medicare gcodes   PT Start Time 1016   PT Stop Time 1104   PT Time Calculation (min) 48 min   Activity Tolerance Patient tolerated treatment well      Past Medical History:  Diagnosis Date  . Asthma   . CAD (coronary artery disease)    last cath 08/2006  . Carotid artery disease (HCC)    moderate left ICA stenosis by 2.6 ultrasound  . DJD (degenerative joint disease), lumbar   . Hearing loss   . HTN (hypertension)   . Hyperlipidemia   . Vision loss of right eye     Past Surgical History:  Procedure Laterality Date  . ABDOMINAL HYSTERECTOMY  1975  . APPENDECTOMY    . CORONARY ANGIOPLASTY WITH STENT PLACEMENT  08/2006   taxus stent x2 to LAD, 2.5x35mm and 2.5x15mm; [facility]  . RENAL [facility]GIOGRAM  [facility] in [place]  . TONSILLECTOMY  1994    There were no vitals filed for this visit.      Subjective Assessment - 01/05/17 1341    Subjective Pt. noting pain free today however knees have been bothering her this morning on and off.     Currently in Pain? No/denies   Multiple Pain Sites No                         [facility] Adult PT Treatment/Exercise - 01/05/17 1033      Knee/Hip Exercises: Stretches   Passive Hamstring Stretch Right;2 reps;30 seconds   Piriformis Stretch 2 reps;30 seconds   Piriformis Stretch Limitations Figure-4 with strap, and KTOS    Other Knee/Hip Stretches R ITB with strap 2 x 30 sec      Knee/Hip Exercises: Aerobic   Stationary  Bike Bike: lvl 1, 6 min      Knee/Hip Exercises: Supine   Bridges with Clamshell Both;15 reps;Strengthening  with sustained hip abd/ER with red TB    Other Supine Knee/Hip Exercises Hooklying LE march with yellow TB around knees x 15 reps each     Knee/Hip Exercises: Sidelying   Clams L sidelying R hip clam shell with yellow T B x 15 reps      Manual Therapy   Manual Therapy Soft tissue mobilization   Manual therapy comments L sidelying with R LE elevated on bolster    Soft tissue mobilization STM with red med ball to R glute complex/piriformis to decrease muscular tension                 PT Education - 01/05/17 1352    Education provided Yes   Education Details seated/supine piriformis stretch with figure-4 and KTOS, ITB stretch with strap, HS stretch with strap   Person(s) Educated Patient   Methods Explanation;Demonstration;Verbal cues;Handout   Comprehension Verbalized understanding;Returned demonstration;Verbal cues required;Need further instruction          PT Short Term Goals - 01/05/17 1052      PT SHORT TERM GOAL #1  Title pt will be independent with initial HEP   Time 4   Period Weeks   Status On-going     PT SHORT TERM GOAL #2   Title pt reports 25% less right hip pain with daily activities   Time 4   Period Weeks   Status On-going     PT SHORT TERM GOAL #3   Title able to perform single leg stand 5 seconds without Trendelenburg bilaterally   Time 4   Period Weeks   Status On-going           PT Long Term Goals - 01/05/17 1054      PT LONG TERM GOAL #1   Title FOTO < or = to 35% limited   Baseline 45%   Time 8   Period Weeks   Status On-going     PT LONG TERM GOAL #2   Title pain at least 50% improved in right hip   Time 8   Period Weeks   Status On-going     PT LONG TERM GOAL #3   Title bilateral hip abduction improved to 4+/5 for reduced Trendelenburg in gait   Time 8   Period Weeks   Status On-going     PT LONG TERM GOAL  #4   Title independent with advanced HEP   Time 8   Period Weeks   Status On-going               Plan - 01/05/17 1044    Clinical Impression Statement Pt. noting pain free today however knees have been bothering her this morning on and off.  Treatment focusing on creation and demonstration of LE flexibility program for home.  Pt. tolerated all LE strengthening activities well today and demonstrated good overall technique.  Some discussion today regarding use of iontophoresis however pt. declined due to skin sensitivity to adhesives.  Will plan to initiate ultrasound or ionto per pt. preference in coming visits.   PT Treatment/Interventions ADLs/Self Care Home Management;Biofeedback;Cryotherapy;Electrical Stimulation;Iontophoresis 4mg /ml Dexamethasone;Moist Heat;Ultrasound;Gait training;Stair training;Therapeutic activities;Therapeutic exercise;Balance training;Neuromuscular re-education;Manual techniques;Passive range of motion;Dry needling;Taping   PT Next Visit Plan core and hip strengthening, ionto/ultrasound to right bursa (written on referral), manual techniques as needed      Patient will benefit from skilled therapeutic intervention in order to improve the following deficits and impairments:  Abnormal gait, Decreased range of motion, Difficulty walking, Decreased strength, Pain, Increased muscle spasms, Postural dysfunction  Visit Diagnosis: Pain in right hip  Muscle weakness (generalized)  Other muscle spasm     Problem List Patient Active Problem List   Diagnosis Date Noted  . Carotid artery disease ([place]) 09/19/2014  . Disturbance of skin sensation 11/17/2013  . Coronary artery disease 03/31/2013  . Essential hypertension 03/31/2013  . Hyperlipidemia 03/31/2013    Bess Harvest, PTA 01/05/17 1:53 PM  [facility] [address] [place], [place], [address] Phone: [phone]   Fax:   [phone]  Name: Kendra [name] MRN: [id number] Date of Birth: 10/10/1935

## 2017-01-12 ENCOUNTER — Encounter: Payer: Self-pay | Admitting: Physical Therapy

## 2017-01-12 ENCOUNTER — Ambulatory Visit: Payer: Medicare Other | Attending: Family Medicine | Admitting: Physical Therapy

## 2017-01-12 DIAGNOSIS — M6281 Muscle weakness (generalized): Secondary | ICD-10-CM

## 2017-01-12 DIAGNOSIS — M25551 Pain in right hip: Secondary | ICD-10-CM | POA: Insufficient documentation

## 2017-01-12 DIAGNOSIS — M62838 Other muscle spasm: Secondary | ICD-10-CM

## 2017-01-12 NOTE — Therapy (Signed)
[facility] [address] [place], [place], [address] Phone: [phone]   Fax:  [phone]  Physical Therapy Treatment  Patient Details  Name: Kendra [name] MRN: [id number] Date of Birth: 1935/09/12 Referring Provider: Rhina Brackett  Encounter Date: 01/12/2017      PT End of Session - 01/12/17 1412    Visit Number 3   Number of Visits 10   Date for PT Re-Evaluation 02/10/17   Authorization Type medicare gcodes   PT Start Time 2202   PT Stop Time 5427   PT Time Calculation (min) 45 min   Activity Tolerance Patient tolerated treatment well      Past Medical History:  Diagnosis Date  . Asthma   . CAD (coronary artery disease)    last cath 08/2006  . Carotid artery disease (HCC)    moderate left ICA stenosis by 2.6 ultrasound  . DJD (degenerative joint disease), lumbar   . Hearing loss   . HTN (hypertension)   . Hyperlipidemia   . Vision loss of right eye     Past Surgical History:  Procedure Laterality Date  . ABDOMINAL HYSTERECTOMY  1975  . APPENDECTOMY    . CORONARY ANGIOPLASTY WITH STENT PLACEMENT  08/2006   taxus stent x2 to LAD, 2.5x82mm and 2.5x30mm; done at [facility]  . RENAL ANGIOGRAM  [facility] in [place]  . TONSILLECTOMY  1994    There were no vitals filed for this visit.      Subjective Assessment - 01/12/17 1405    Subjective My hip still bothers me. It's not too bad today but I can feel it.     Limitations Walking;Sitting;Other (comment)   Currently in Pain? Yes   Pain Score 6    Pain Location Hip   Pain Orientation Right   Pain Descriptors / Indicators Burning   Pain Type Acute pain   Pain Radiating Towards down thigh   Pain Onset More than a month ago   Pain Frequency Intermittent   Aggravating Factors  standing, walking, steps   Pain Relieving Factors biofreeze and muscle relaxer   Effect of Pain on Daily Activities standing activities   Multiple  Pain Sites No                         [facility] Adult PT Treatment/Exercise - 01/12/17 0001      Knee/Hip Exercises: Stretches   Active Hamstring Stretch Right;Left;3 reps;30 seconds  in sitting   Piriformis Stretch 2 reps;30 seconds   Piriformis Stretch Limitations Figure-4 with strap, and KTOS    Other Knee/Hip Stretches R ITB with strap 2 x 30 sec      Knee/Hip Exercises: Aerobic   Stationary Bike Bike: lvl 1, 6 min      Knee/Hip Exercises: Supine   Bridges with Clamshell Both;15 reps;Strengthening  with sustained hip abd/ER with red TB    Straight Leg Raises Strengthening;Right;Left;10 reps     Modalities   Modalities Iontophoresis     Iontophoresis   Type of Iontophoresis Dexamethasone   Location right GT bursa   Dose 1.0 mL   Time 4-6 hour release                  PT Short Term Goals - 01/12/17 1420      PT SHORT TERM GOAL #1   Title pt will be independent with initial HEP   Time 4   Period  Weeks   Status On-going     PT SHORT TERM GOAL #2   Title pt reports 25% less right hip pain with daily activities   Time 4   Period Weeks   Status On-going     PT SHORT TERM GOAL #3   Title able to perform single leg stand 5 seconds without Trendelenburg bilaterally   Time 4   Period Weeks   Status On-going           PT Long Term Goals - 01/05/17 1054      PT LONG TERM GOAL #1   Title FOTO < or = to 35% limited   Baseline 45%   Time 8   Period Weeks   Status On-going     PT LONG TERM GOAL #2   Title pain at least 50% improved in right hip   Time 8   Period Weeks   Status On-going     PT LONG TERM GOAL #3   Title bilateral hip abduction improved to 4+/5 for reduced Trendelenburg in gait   Time 8   Period Weeks   Status On-going     PT LONG TERM GOAL #4   Title independent with advanced HEP   Time 8   Period Weeks   Status On-going               Plan - 01/12/17 1412    Clinical Impression Statement Patient able  to some resistance with band and added to exercises today.  Monitered for pain throughout, no increased pain today other than some increased back discomfort sitting on recumbant bike even with pillow and adjustments.  Pt received ionto #1 treatment and will follow up at next visit.  She continues to need skilled PT for improved hip and core strength.   PT Treatment/Interventions ADLs/Self Care Home Management;Biofeedback;Cryotherapy;Electrical Stimulation;Iontophoresis 4mg /ml Dexamethasone;Moist Heat;Ultrasound;Gait training;Stair training;Therapeutic activities;Therapeutic exercise;Balance training;Neuromuscular re-education;Manual techniques;Passive range of motion;Dry needling;Taping   PT Next Visit Plan NO Bikecore and hip strengthening, f/u with ionto #1, manual techniques as needed   Consulted and Agree with Plan of Care Patient      Patient will benefit from skilled therapeutic intervention in order to improve the following deficits and impairments:  Abnormal gait, Decreased range of motion, Difficulty walking, Decreased strength, Pain, Increased muscle spasms, Postural dysfunction  Visit Diagnosis: Pain in right hip  Muscle weakness (generalized)  Other muscle spasm     Problem List Patient Active Problem List   Diagnosis Date Noted  . Carotid artery disease ([place]) 09/19/2014  . Disturbance of skin sensation 11/17/2013  . Coronary artery disease 03/31/2013  . Essential hypertension 03/31/2013  . Hyperlipidemia 03/31/2013    Zannie Cove, PT 01/12/2017, 2:53 PM  [facility] [address], [place], [place], [address] Phone: [phone]   Fax:  [phone]  Name: Kendra [name] MRN: [id number] Date of Birth: [birth year]

## 2017-01-14 ENCOUNTER — Ambulatory Visit: Payer: Medicare Other | Admitting: Physical Therapy

## 2017-01-14 DIAGNOSIS — M6281 Muscle weakness (generalized): Secondary | ICD-10-CM | POA: Diagnosis not present

## 2017-01-14 DIAGNOSIS — M25551 Pain in right hip: Secondary | ICD-10-CM

## 2017-01-14 DIAGNOSIS — M62838 Other muscle spasm: Secondary | ICD-10-CM | POA: Diagnosis not present

## 2017-01-14 NOTE — Therapy (Signed)
[facility] [address], [place], [place], [address] Phone: [phone]   Fax:  [phone]  Physical Therapy Treatment  Patient Details  Name: Kendra [name] MRN: [id number] Date of Birth: 10/03/1935 Referring Provider: Rhina Brackett  Encounter Date: 01/14/2017      PT End of Session - 01/14/17 1404    Visit Number 4   Number of Visits 10   Date for PT Re-Evaluation 02/10/17   Authorization Type medicare gcodes   PT Start Time 0998   PT Stop Time 1443   PT Time Calculation (min) 39 min   Activity Tolerance Patient tolerated treatment well      Past Medical History:  Diagnosis Date  . Asthma   . CAD (coronary artery disease)    last cath 08/2006  . Carotid artery disease (HCC)    moderate left ICA stenosis by 2.6 ultrasound  . DJD (degenerative joint disease), lumbar   . Hearing loss   . HTN (hypertension)   . Hyperlipidemia   . Vision loss of right eye     Past Surgical History:  Procedure Laterality Date  . ABDOMINAL HYSTERECTOMY  1975  . APPENDECTOMY    . CORONARY ANGIOPLASTY WITH STENT PLACEMENT  08/2006   taxus stent x2 to LAD, 2.5x80mm and 2.5x40mm; done at [facility]  . RENAL ANGIOGRAM  [facility] in [place]  . TONSILLECTOMY  1994    There were no vitals filed for this visit.      Subjective Assessment - 01/14/17 1406    Subjective I am having a little pain.  I put something on it in the morning like biofreeze    Limitations Walking;Sitting;Other (comment)   Currently in Pain? Yes   Pain Score 5    Pain Location Hip   Pain Orientation Right   Pain Descriptors / Indicators Burning   Pain Type Acute pain   Pain Radiating Towards down thigh   Pain Onset More than a month ago   Pain Frequency Intermittent   Aggravating Factors  standing, walking   Multiple Pain Sites No                         [facility] Adult PT Treatment/Exercise - 01/14/17 0001       Knee/Hip Exercises: Stretches   Active Hamstring Stretch Right;Left;3 reps;30 seconds  in sitting   Piriformis Stretch 2 reps;30 seconds   Piriformis Stretch Limitations Figure-4 with strap, and KTOS    Other Knee/Hip Stretches --     Knee/Hip Exercises: Aerobic   Stationary Bike --  PT pre   Nustep lvl 1 x 2 min - knee was getting irritated     Knee/Hip Exercises: Standing   Lateral Step Up Right;15 reps;Hand Hold: 0   SLS stand with other hip 3ways - 10x each way   Other Standing Knee Exercises side stepping - no resistance - 1 lap across carpet both ways     Knee/Hip Exercises: Supine   Bridges with Clamshell Both;15 reps;Strengthening;2 sets  with sustained hip abd/ER with red TB    Straight Leg Raises Strengthening;Right;Left;2 sets;10 reps     Knee/Hip Exercises: Sidelying   Hip ABduction Strengthening;Right;Left;10 reps   Clams L sidelying R hip clam shell with red T B x 15 reps      Modalities   Modalities Iontophoresis     Iontophoresis   Type of Iontophoresis Dexamethasone   Location right GT bursa  Dose 1.0 mL   Time 4-6 hour release                  PT Short Term Goals - 01/12/17 1420      PT SHORT TERM GOAL #1   Title pt will be independent with initial HEP   Time 4   Period Weeks   Status On-going     PT SHORT TERM GOAL #2   Title pt reports 25% less right hip pain with daily activities   Time 4   Period Weeks   Status On-going     PT SHORT TERM GOAL #3   Title able to perform single leg stand 5 seconds without Trendelenburg bilaterally   Time 4   Period Weeks   Status On-going           PT Long Term Goals - 01/05/17 1054      PT LONG TERM GOAL #1   Title FOTO < or = to 35% limited   Baseline 45%   Time 8   Period Weeks   Status On-going     PT LONG TERM GOAL #2   Title pain at least 50% improved in right hip   Time 8   Period Weeks   Status On-going     PT LONG TERM GOAL #3   Title bilateral hip abduction  improved to 4+/5 for reduced Trendelenburg in gait   Time 8   Period Weeks   Status On-going     PT LONG TERM GOAL #4   Title independent with advanced HEP   Time 8   Period Weeks   Status On-going               Plan - 01/14/17 1443    Clinical Impression Statement Patient was able to increase intensity and resistance of exercises today.  Pt dmeonstrates good form.  She is somewhat limited with knee pain that is a chronic issue.  Pt continues to need skilled PT for hip strengthening in order to reduce stress on bursa and pain management for reduced inflammation.   Rehab Potential Excellent   PT Treatment/Interventions ADLs/Self Care Home Management;Biofeedback;Cryotherapy;Electrical Stimulation;Iontophoresis 4mg /ml Dexamethasone;Moist Heat;Ultrasound;Gait training;Stair training;Therapeutic activities;Therapeutic exercise;Balance training;Neuromuscular re-education;Manual techniques;Passive range of motion;Dry needling;Taping   PT Next Visit Plan NO Bikecore and hip strengthening, f/u with ionto #1, manual techniques as needed   Consulted and Agree with Plan of Care Patient      Patient will benefit from skilled therapeutic intervention in order to improve the following deficits and impairments:  Abnormal gait, Decreased range of motion, Difficulty walking, Decreased strength, Pain, Increased muscle spasms, Postural dysfunction  Visit Diagnosis: Pain in right hip  Muscle weakness (generalized)  Other muscle spasm     Problem List Patient Active Problem List   Diagnosis Date Noted  . Carotid artery disease ([place]) 09/19/2014  . Disturbance of skin sensation 11/17/2013  . Coronary artery disease 03/31/2013  . Essential hypertension 03/31/2013  . Hyperlipidemia 03/31/2013    Zannie Cove, PT 01/14/2017, 2:46 PM  [facility] [address] [place], [place], [address] Phone: [phone]   Fax:   [phone]  Name: Kendra [name] MRN: [id number] Date of Birth: 1936-01-04

## 2017-01-19 ENCOUNTER — Ambulatory Visit: Payer: Medicare Other | Admitting: Physical Therapy

## 2017-01-19 ENCOUNTER — Encounter: Payer: Self-pay | Admitting: Physical Therapy

## 2017-01-19 DIAGNOSIS — M6281 Muscle weakness (generalized): Secondary | ICD-10-CM

## 2017-01-19 DIAGNOSIS — M62838 Other muscle spasm: Secondary | ICD-10-CM

## 2017-01-19 DIAGNOSIS — M25551 Pain in right hip: Secondary | ICD-10-CM

## 2017-01-19 NOTE — Therapy (Signed)
[facility] [address] [place], [place], [address] Phone: [phone]   Fax:  [phone]  Physical Therapy Treatment  Patient Details  Name: Kendra [name] MRN: [id number] Date of Birth: September 25, 1935 Referring Provider: Rhina Brackett  Encounter Date: 01/19/2017      PT End of Session - 01/19/17 1405    Visit Number 5   Number of Visits 10   Date for PT Re-Evaluation 02/10/17   Authorization Type medicare gcodes   PT Start Time 1400   PT Stop Time 1440   PT Time Calculation (min) 40 min   Activity Tolerance Patient tolerated treatment well   Behavior During Therapy [facility] for tasks assessed/performed      Past Medical History:  Diagnosis Date  . Asthma   . CAD (coronary artery disease)    last cath 08/2006  . Carotid artery disease (HCC)    moderate left ICA stenosis by 2.6 ultrasound  . DJD (degenerative joint disease), lumbar   . Hearing loss   . HTN (hypertension)   . Hyperlipidemia   . Vision loss of right eye     Past Surgical History:  Procedure Laterality Date  . ABDOMINAL HYSTERECTOMY  1975  . APPENDECTOMY    . CORONARY ANGIOPLASTY WITH STENT PLACEMENT  08/2006   taxus stent x2 to LAD, 2.5x65mm and 2.5x49mm; done at [facility]  . RENAL ANGIOGRAM  [facility] in [place]  . TONSILLECTOMY  1994    There were no vitals filed for this visit.      Subjective Assessment - 01/19/17 1403    Subjective The ionto helped for 1.5 days. Less pain down right thigh.    Limitations Walking;Sitting;Other (comment)   Patient Stated Goals no pain in hip when she stretches it   Currently in Pain? Yes   Pain Score 4    Pain Location Hip   Pain Orientation Right   Pain Descriptors / Indicators Burning   Pain Type Acute pain   Aggravating Factors  standing , walking   Pain Relieving Factors biofreeze and muscle relaxer   Effect of Pain on Daily Activities standing activities    Multiple Pain Sites No                         [facility] Adult PT Treatment/Exercise - 01/19/17 0001      Knee/Hip Exercises: Stretches   Active Hamstring Stretch Right;Left;3 reps;30 seconds  in sitting   Piriformis Stretch 2 reps;30 seconds   Piriformis Stretch Limitations sitting     Knee/Hip Exercises: Standing   Other Standing Knee Exercises side stepping - no resistance - 2 lap across carpet both ways with yellow band     Knee/Hip Exercises: Supine   Bridges Limitations bridge with marching 10x with good trunk control   Straight Leg Raises Strengthening;Right;Left;2 sets;10 reps   Straight Leg Raises Limitations hip circles on right hip 10 both ways     Knee/Hip Exercises: Sidelying   Clams L sidelying R hip clam shell with red T B x 15 reps      Modalities   Modalities --     Manual Therapy   Manual Therapy Soft tissue mobilization   Soft tissue mobilization to right iliotibial band, right gluteus medius, right gluteal                  PT Short Term Goals - 01/19/17 1406  PT SHORT TERM GOAL #1   Title pt will be independent with initial HEP   Time 4   Period Weeks   Status Achieved     PT SHORT TERM GOAL #2   Title pt reports 25% less right hip pain with daily activities   Time 4   Period Weeks   Status Achieved     PT SHORT TERM GOAL #3   Title able to perform single leg stand 5 seconds without Trendelenburg bilaterally   Time 4   Period Weeks   Status On-going           PT Long Term Goals - 01/05/17 1054      PT LONG TERM GOAL #1   Title FOTO < or = to 35% limited   Baseline 45%   Time 8   Period Weeks   Status On-going     PT LONG TERM GOAL #2   Title pain at least 50% improved in right hip   Time 8   Period Weeks   Status On-going     PT LONG TERM GOAL #3   Title bilateral hip abduction improved to 4+/5 for reduced Trendelenburg in gait   Time 8   Period Weeks   Status On-going     PT LONG TERM GOAL #4    Title independent with advanced HEP   Time 8   Period Weeks   Status On-going               Plan - 01/19/17 1406    Clinical Impression Statement Patient reports her right hip pain is 25% better.  Patient has less pain in right leg. Patient is able to perform exercises without increase in pain.  Patient is not doing her exercises consistently.  Patient will benefit  from skilled therapy to reduce stress on bursa and pain management for reduce infammation.    Rehab Potential Excellent   PT Frequency 2x / week   PT Duration 8 weeks   PT Treatment/Interventions ADLs/Self Care Home Management;Biofeedback;Cryotherapy;Electrical Stimulation;Iontophoresis 4mg /ml Dexamethasone;Moist Heat;Ultrasound;Gait training;Stair training;Therapeutic activities;Therapeutic exercise;Balance training;Neuromuscular re-education;Manual techniques;Passive range of motion;Dry needling;Taping   PT Next Visit Plan NO Bikecore and hip strengthening, , manual techniques as needed, do electrical stimulation to right hip, if patient wants ionto again then give ionto handout   PT Home Exercise Plan progresss as needed   Recommended Other Services MD signed initial cert   Consulted and Agree with Plan of Care Patient      Patient will benefit from skilled therapeutic intervention in order to improve the following deficits and impairments:  Abnormal gait, Decreased range of motion, Difficulty walking, Decreased strength, Pain, Increased muscle spasms, Postural dysfunction  Visit Diagnosis: Pain in right hip  Muscle weakness (generalized)  Other muscle spasm     Problem List Patient Active Problem List   Diagnosis Date Noted  . Carotid artery disease ([place]) 09/19/2014  . Disturbance of skin sensation 11/17/2013  . Coronary artery disease 03/31/2013  . Essential hypertension 03/31/2013  . Hyperlipidemia 03/31/2013    Earlie Counts, PT 01/19/17 2:46 PM   [facility] [address] [place], [place], [address] Phone: [phone]   Fax:  [phone]  Name: Tmya Wigington MRN: [id number] Date of Birth: 09/30/1935

## 2017-01-21 ENCOUNTER — Ambulatory Visit: Payer: Medicare Other | Admitting: Physical Therapy

## 2017-01-21 DIAGNOSIS — M62838 Other muscle spasm: Secondary | ICD-10-CM

## 2017-01-21 DIAGNOSIS — M25551 Pain in right hip: Secondary | ICD-10-CM | POA: Diagnosis not present

## 2017-01-21 DIAGNOSIS — M6281 Muscle weakness (generalized): Secondary | ICD-10-CM | POA: Diagnosis not present

## 2017-01-21 NOTE — Therapy (Signed)
[facility] [address], [place], [place], [address] Phone: [phone]   Fax:  [phone]  Physical Therapy Treatment  Patient Details  Name: Kendra [name] MRN: [id number] Date of Birth: 1935/09/24 Referring Provider: Rhina Brackett  Encounter Date: 01/21/2017      PT End of Session - 01/21/17 1412    Visit Number 6   Number of Visits 10   Date for PT Re-Evaluation 02/10/17   Authorization Type medicare gcodes   PT Start Time 1400   PT Stop Time 1440   PT Time Calculation (min) 40 min   Activity Tolerance Patient tolerated treatment well   Behavior During Therapy [facility] for tasks assessed/performed      Past Medical History:  Diagnosis Date  . Asthma   . CAD (coronary artery disease)    last cath 08/2006  . Carotid artery disease (HCC)    moderate left ICA stenosis by 2.6 ultrasound  . DJD (degenerative joint disease), lumbar   . Hearing loss   . HTN (hypertension)   . Hyperlipidemia   . Vision loss of right eye     Past Surgical History:  Procedure Laterality Date  . ABDOMINAL HYSTERECTOMY  1975  . APPENDECTOMY    . CORONARY ANGIOPLASTY WITH STENT PLACEMENT  08/2006   taxus stent x2 to LAD, 2.5x76mm and 2.5x10mm; done at [facility]  . RENAL ANGIOGRAM  [facility] in [place]  . TONSILLECTOMY  1994    There were no vitals filed for this visit.      Subjective Assessment - 01/21/17 1408    Subjective The massage seemed to last longer than the ionto.  I think the estim was good when I've had that in the past.     Limitations Walking;Sitting;Other (comment)   Currently in Pain? Yes   Pain Score 4    Pain Location Hip   Pain Orientation Right   Pain Descriptors / Indicators Burning   Pain Type Acute pain   Pain Radiating Towards down thigh only about 3 inches, it is more localized   Pain Onset More than a month ago   Pain Frequency Intermittent   Aggravating Factors   standing and walking   Pain Relieving Factors biofreeze and mu   Multiple Pain Sites No                         [facility] Adult PT Treatment/Exercise - 01/21/17 0001      Knee/Hip Exercises: Stretches   Active Hamstring Stretch Right;Left;3 reps;30 seconds  supine with strap   Piriformis Stretch 2 reps;30 seconds   Piriformis Stretch Limitations sitting   Other Knee/Hip Stretches R ITB with strap 2 x 30 sec      Knee/Hip Exercises: Standing   Other Standing Knee Exercises side stepping - 2 lap across carpet both ways with yellow band     Knee/Hip Exercises: Supine   Bridges Limitations bridge with marching 10x with good trunk control   Straight Leg Raises Strengthening;Right;Left;2 sets;10 reps     Knee/Hip Exercises: Sidelying   Hip ABduction Strengthening;Right;Left;10 reps  hip circles   Clams L sidelying R hip clam shell with red T B x 15 reps      Manual Therapy   Manual Therapy Soft tissue mobilization   Soft tissue mobilization to right iliotibial band, right gluteus medius, right gluteal  PT Short Term Goals - 01/19/17 1406      PT SHORT TERM GOAL #1   Title pt will be independent with initial HEP   Time 4   Period Weeks   Status Achieved     PT SHORT TERM GOAL #2   Title pt reports 25% less right hip pain with daily activities   Time 4   Period Weeks   Status Achieved     PT SHORT TERM GOAL #3   Title able to perform single leg stand 5 seconds without Trendelenburg bilaterally   Time 4   Period Weeks   Status On-going           PT Long Term Goals - 01/05/17 1054      PT LONG TERM GOAL #1   Title FOTO < or = to 35% limited   Baseline 45%   Time 8   Period Weeks   Status On-going     PT LONG TERM GOAL #2   Title pain at least 50% improved in right hip   Time 8   Period Weeks   Status On-going     PT LONG TERM GOAL #3   Title bilateral hip abduction improved to 4+/5 for reduced Trendelenburg in gait    Time 8   Period Weeks   Status On-going     PT LONG TERM GOAL #4   Title independent with advanced HEP   Time 8   Period Weeks   Status On-going               Plan - 01/21/17 1414    Clinical Impression Statement Patient states she is slowly feeling improvements.  Pt demonstrates weakness bilaterally and tight glutes and IT band Rt>Lt.  Pt did well with strengthening exerciess and able to maintain good pelvic stability.  She was able to increase resistance.  She will continue to benefit from skilled PT for improved function and pain management.   Rehab Potential Excellent   PT Treatment/Interventions ADLs/Self Care Home Management;Biofeedback;Cryotherapy;Electrical Stimulation;Iontophoresis 4mg /ml Dexamethasone;Moist Heat;Ultrasound;Gait training;Stair training;Therapeutic activities;Therapeutic exercise;Balance training;Neuromuscular re-education;Manual techniques;Passive range of motion;Dry needling;Taping   PT Next Visit Plan NO Bike core and hip strengthening, , manual techniques as needed, do electrical stimulation to right hip, if patient wants ionto again then give ionto handout   Consulted and Agree with Plan of Care Patient      Patient will benefit from skilled therapeutic intervention in order to improve the following deficits and impairments:  Abnormal gait, Decreased range of motion, Difficulty walking, Decreased strength, Pain, Increased muscle spasms, Postural dysfunction  Visit Diagnosis: No diagnosis found.     Problem List Patient Active Problem List   Diagnosis Date Noted  . Carotid artery disease ([place]) 09/19/2014  . Disturbance of skin sensation 11/17/2013  . Coronary artery disease 03/31/2013  . Essential hypertension 03/31/2013  . Hyperlipidemia 03/31/2013    Zannie Cove, PT 01/21/2017, 2:51 PM  [facility] [address] [place], [place], [address] Phone: [phone]   Fax:   [phone]  Name: Kendra [name] MRN: [id number] Date of Birth: 26-Dec-1935

## 2017-01-26 ENCOUNTER — Ambulatory Visit: Payer: Medicare Other | Admitting: Physical Therapy

## 2017-01-26 ENCOUNTER — Encounter: Payer: Self-pay | Admitting: Physical Therapy

## 2017-01-26 DIAGNOSIS — M62838 Other muscle spasm: Secondary | ICD-10-CM

## 2017-01-26 DIAGNOSIS — M25551 Pain in right hip: Secondary | ICD-10-CM | POA: Diagnosis not present

## 2017-01-26 DIAGNOSIS — M6281 Muscle weakness (generalized): Secondary | ICD-10-CM

## 2017-01-26 NOTE — Therapy (Signed)
[facility] [address] [place], [place], [address] Phone: [phone]   Fax:  [phone]  Physical Therapy Treatment  Patient Details  Name: Kendra [name] MRN: [id number] Date of Birth: 02/12/1936 Referring Provider: Rhina Brackett  Encounter Date: 01/26/2017      PT End of Session - 01/26/17 1401    Visit Number 7   Number of Visits 10   Date for PT Re-Evaluation 02/10/17   Authorization Type medicare gcodes   PT Start Time 1401   PT Stop Time 1445   PT Time Calculation (min) 44 min   Activity Tolerance Patient tolerated treatment well   Behavior During Therapy [facility] for tasks assessed/performed      Past Medical History:  Diagnosis Date  . Asthma   . CAD (coronary artery disease)    last cath 08/2006  . Carotid artery disease (HCC)    moderate left ICA stenosis by 2.6 ultrasound  . DJD (degenerative joint disease), lumbar   . Hearing loss   . HTN (hypertension)   . Hyperlipidemia   . Vision loss of right eye     Past Surgical History:  Procedure Laterality Date  . ABDOMINAL HYSTERECTOMY  1975  . APPENDECTOMY    . CORONARY ANGIOPLASTY WITH STENT PLACEMENT  08/2006   taxus stent x2 to LAD, 2.5x40m and [facility]. TONSILLECTOMY  1994    There were no vitals filed for this visit.      Subjective Assessment - 01/26/17 1403    Subjective The massage helped.  Pt states she had a good workout in tai chi.     Patient Stated Goals no pain in hip when she stretches it   Currently in Pain? Yes   Pain Score 3    Pain Location Hip   Pain Orientation Right   Pain Descriptors / Indicators Burning   Pain Type Acute pain   Pain Radiating Towards down slightly into the leg   Pain Onset More than a month ago   Pain Frequency Intermittent                         [facility] Adult PT Treatment/Exercise -  01/26/17 0001      Knee/Hip Exercises: Stretches   Active Hamstring Stretch Right;Left;3 reps;30 seconds  supine with strap   Piriformis Stretch 30 seconds;3 reps   Piriformis Stretch Limitations supine   Other Knee/Hip Stretches R ITB with strap 2 x 30 sec      Knee/Hip Exercises: Standing   Walking with Sports Cord back, both sides - 15# - 5x each     Knee/Hip Exercises: Supine   Bridges Limitations bridge with marching 10x with good trunk control   Bridges with Clamshell Both;15 reps;Strengthening;2 sets     Manual Therapy   Manual Therapy Soft tissue mobilization   Soft tissue mobilization to right iliotibial band, right gluteus medius, right gluteal                  PT Short Term Goals - 01/19/17 1406      PT SHORT TERM GOAL #1   Title pt will be independent with initial HEP   Time 4   Period Weeks   Status Achieved     PT SHORT TERM GOAL #2   Title pt reports 25% less right hip pain with daily activities  Time 4   Period Weeks   Status Achieved     PT SHORT TERM GOAL #3   Title able to perform single leg stand 5 seconds without Trendelenburg bilaterally   Time 4   Period Weeks   Status On-going           PT Long Term Goals - 01/26/17 1447      PT LONG TERM GOAL #1   Title FOTO < or = to 35% limited   Time 8   Period Weeks   Status On-going     PT LONG TERM GOAL #2   Title pain at least 50% improved in right hip   Time 8   Period Weeks   Status On-going     PT LONG TERM GOAL #3   Title bilateral hip abduction improved to 4+/5 for reduced Trendelenburg in gait   Time 8   Period Weeks   Status On-going     PT LONG TERM GOAL #4   Title independent with advanced HEP   Time 8   Period Weeks   Status On-going               Plan - 01/26/17 1401    Clinical Impression Statement Pt noticing less pain between sessions and feels the soft tissue work is helping.  Pt was able to progress with standing exercises.  She had a little  difficulty with side stepping but was able to control her steps well.  She continues to need skilled PT for improved hip and core strength for functional activities.   PT Treatment/Interventions ADLs/Self Care Home Management;Biofeedback;Cryotherapy;Electrical Stimulation;Iontophoresis 2m/ml Dexamethasone;Moist Heat;Ultrasound;Gait training;Stair training;Therapeutic activities;Therapeutic exercise;Balance training;Neuromuscular re-education;Manual techniques;Passive range of motion;Dry needling;Taping   PT Next Visit Plan NO Bike core and hip strengthening, standing and hip stability, manual techniques as needed, if patient wants ionto again then give ionto handout   Consulted and Agree with Plan of Care Patient      Patient will benefit from skilled therapeutic intervention in order to improve the following deficits and impairments:  Abnormal gait, Decreased range of motion, Difficulty walking, Decreased strength, Pain, Increased muscle spasms, Postural dysfunction  Visit Diagnosis: Pain in right hip  Muscle weakness (generalized)  Other muscle spasm     Problem List Patient Active Problem List   Diagnosis Date Noted  . Carotid artery disease ([place] 09/19/2014  . Disturbance of skin sensation 11/17/2013  . Coronary artery disease 03/31/2013  . Essential hypertension 03/31/2013  . Hyperlipidemia 03/31/2013    JZannie Cove PT 01/26/2017, 2:48 PM  [facility] [address] [place] [address]Phone: [phone]  Fax:  [phone] Name: Kendra CorrentiMRN: [id number] of Birth: [birth year]

## 2017-01-28 ENCOUNTER — Ambulatory Visit: Payer: Medicare Other | Admitting: Physical Therapy

## 2017-01-28 ENCOUNTER — Encounter: Payer: Self-pay | Admitting: Physical Therapy

## 2017-01-28 DIAGNOSIS — M62838 Other muscle spasm: Secondary | ICD-10-CM

## 2017-01-28 DIAGNOSIS — M6281 Muscle weakness (generalized): Secondary | ICD-10-CM | POA: Diagnosis not present

## 2017-01-28 DIAGNOSIS — M25551 Pain in right hip: Secondary | ICD-10-CM

## 2017-01-28 NOTE — Therapy (Signed)
[facility] [address] [place], [place], [address] Phone: [phone]   Fax:  [phone]  Physical Therapy Treatment  Patient Details  Name: Kendra [name] MRN: [id number] Date of Birth: February 07, 1936 Referring Provider: Rhina Brackett  Encounter Date: 01/28/2017      PT End of Session - 01/28/17 1110    Visit Number 8   Number of Visits 10   Date for PT Re-Evaluation 02/10/17   Authorization Type medicare gcodes   PT Start Time 1104   PT Stop Time 1142   PT Time Calculation (min) 38 min   Activity Tolerance Patient tolerated treatment well   Behavior During Therapy [facility] for tasks assessed/performed      Past Medical History:  Diagnosis Date  . Asthma   . CAD (coronary artery disease)    last cath 08/2006  . Carotid artery disease (HCC)    moderate left ICA stenosis by 2.6 ultrasound  . DJD (degenerative joint disease), lumbar   . Hearing loss   . HTN (hypertension)   . Hyperlipidemia   . Vision loss of right eye     Past Surgical History:  Procedure Laterality Date  . ABDOMINAL HYSTERECTOMY  1975  . APPENDECTOMY    . CORONARY ANGIOPLASTY WITH STENT PLACEMENT  08/2006   taxus stent x2 to LAD, 2.5x58mm and 2.5x59mm; done at [facility]  . RENAL ANGIOGRAM  [facility] in [place]  . TONSILLECTOMY  1994    There were no vitals filed for this visit.      Subjective Assessment - 01/28/17 1108    Subjective I haven't put anything on it or taken anything.  I thought I would test it.     Limitations Walking;Sitting;Other (comment)   Currently in Pain? Yes   Pain Score 3    Pain Orientation Right   Pain Descriptors / Indicators Burning;Dull  pulling   Pain Onset More than a month ago   Pain Frequency Intermittent   Multiple Pain Sites No                         [facility] Adult PT Treatment/Exercise - 01/28/17 0001      Neuro Re-ed    Neuro Re-ed Details   standing on foam mat for hip stabilty - hip 3 ways and single leg standing     Knee/Hip Exercises: Stretches   Hip Flexor Stretch Right;3 reps;20 seconds;Left   Other Knee/Hip Stretches R ITB standing - 3 x 20     Knee/Hip Exercises: Standing   Knee Flexion Strengthening;Right;Left;10 reps   Other Standing Knee Exercises hip 3 ways - one UE   Other Standing Knee Exercises side step - yellow band - 10x each way     Knee/Hip Exercises: Seated   Other Seated Knee/Hip Exercises heel slides up shin - 10x right     Knee/Hip Exercises: Supine   Other Supine Knee/Hip Exercises hip flexion, extension, abd, adduction - 15x each way                  PT Short Term Goals - 01/19/17 1406      PT SHORT TERM GOAL #1   Title pt will be independent with initial HEP   Time 4   Period Weeks   Status Achieved     PT SHORT TERM GOAL #2   Title pt reports 25% less right hip pain with daily activities   Time  4   Period Weeks   Status Achieved     PT SHORT TERM GOAL #3   Title able to perform single leg stand 5 seconds without Trendelenburg bilaterally   Time 4   Period Weeks   Status On-going           PT Long Term Goals - 01/26/17 1447      PT LONG TERM GOAL #1   Title FOTO < or = to 35% limited   Time 8   Period Weeks   Status On-going     PT LONG TERM GOAL #2   Title pain at least 50% improved in right hip   Time 8   Period Weeks   Status On-going     PT LONG TERM GOAL #3   Title bilateral hip abduction improved to 4+/5 for reduced Trendelenburg in gait   Time 8   Period Weeks   Status On-going     PT LONG TERM GOAL #4   Title independent with advanced HEP   Time 8   Period Weeks   Status On-going               Plan - 01/28/17 1113    Clinical Impression Statement Patient performed exercises well today and was able to progress with more standing exercises.  Pt is limited with some exercises due to knee arthritis.  Had difficulty with side stepping  with band and needed cues to bring feet together slowly.  Pt will do exercises on her own while going out of town and will re-assess upon arrival back to PT after her trip.  Pt will benefit from skilled PT to improver overall LE strength to maximize function.   Rehab Potential Excellent   PT Treatment/Interventions ADLs/Self Care Home Management;Biofeedback;Cryotherapy;Electrical Stimulation;Iontophoresis 4mg /ml Dexamethasone;Moist Heat;Ultrasound;Gait training;Stair training;Therapeutic activities;Therapeutic exercise;Balance training;Neuromuscular re-education;Manual techniques;Passive range of motion;Dry needling;Taping   PT Next Visit Plan NO Bike core and hip strengthening, standing and hip stability, manual techniques as needed, if patient wants ionto again then give ionto handout   Consulted and Agree with Plan of Care Patient      Patient will benefit from skilled therapeutic intervention in order to improve the following deficits and impairments:  Abnormal gait, Decreased range of motion, Difficulty walking, Decreased strength, Pain, Increased muscle spasms, Postural dysfunction  Visit Diagnosis: Pain in right hip  Muscle weakness (generalized)  Other muscle spasm     Problem List Patient Active Problem List   Diagnosis Date Noted  . Carotid artery disease ([place]) 09/19/2014  . Disturbance of skin sensation 11/17/2013  . Coronary artery disease 03/31/2013  . Essential hypertension 03/31/2013  . Hyperlipidemia 03/31/2013    Zannie Cove, PT 01/28/2017, 11:48 AM  [facility] [address] [place], [place], [address] Phone: [phone]   Fax:  [phone]  Name: Kendra [name] MRN: [id number] Date of Birth: 08-20-1935

## 2017-01-29 ENCOUNTER — Ambulatory Visit
Admission: RE | Admit: 2017-01-29 | Discharge: 2017-01-29 | Disposition: A | Discharge status: Home / Self Care | Payer: Medicare Other | Source: Ambulatory Visit | Attending: Family Medicine | Admitting: Family Medicine

## 2017-01-29 DIAGNOSIS — Z1231 Encounter for screening mammogram for malignant neoplasm of breast: Secondary | ICD-10-CM

## 2017-02-15 DIAGNOSIS — L258 Unspecified contact dermatitis due to other agents: Secondary | ICD-10-CM | POA: Diagnosis not present

## 2017-02-15 DIAGNOSIS — L03032 Cellulitis of left toe: Secondary | ICD-10-CM | POA: Diagnosis not present

## 2017-02-15 DIAGNOSIS — L03031 Cellulitis of right toe: Secondary | ICD-10-CM | POA: Diagnosis not present

## 2017-02-18 ENCOUNTER — Ambulatory Visit: Payer: Medicare Other | Attending: Family Medicine | Admitting: Physical Therapy

## 2017-02-18 ENCOUNTER — Encounter: Payer: Self-pay | Admitting: Physical Therapy

## 2017-02-18 DIAGNOSIS — M62838 Other muscle spasm: Secondary | ICD-10-CM | POA: Insufficient documentation

## 2017-02-18 DIAGNOSIS — M6281 Muscle weakness (generalized): Secondary | ICD-10-CM

## 2017-02-18 DIAGNOSIS — M25551 Pain in right hip: Secondary | ICD-10-CM | POA: Diagnosis not present

## 2017-02-18 NOTE — Therapy (Signed)
[facility] [address] [place], [place], [address] Phone: [phone]   Fax:  [phone]  Physical Therapy Treatment  Patient Details  Name: Kendra [name] MRN: [id number] Date of Birth: 1935-10-10 Referring Provider: Rhina Brackett  Encounter Date: 02/18/2017      PT End of Session - 02/18/17 1618    Visit Number 9   Number of Visits 10   Date for PT Re-Evaluation 02/18/17   Authorization Type medicare gcodes   PT Start Time 1618   PT Stop Time 1659   PT Time Calculation (min) 41 min   Activity Tolerance Patient tolerated treatment well   Behavior During Therapy [facility] for tasks assessed/performed      Past Medical History:  Diagnosis Date  . Asthma   . CAD (coronary artery disease)    last cath 08/2006  . Carotid artery disease (HCC)    moderate left ICA stenosis by 2.6 ultrasound  . DJD (degenerative joint disease), lumbar   . Hearing loss   . HTN (hypertension)   . Hyperlipidemia   . Vision loss of right eye     Past Surgical History:  Procedure Laterality Date  . ABDOMINAL HYSTERECTOMY  1975  . APPENDECTOMY    . CORONARY ANGIOPLASTY WITH STENT PLACEMENT  08/2006   taxus stent x2 to LAD, 2.5x82m and 2.[facility]x861[facility] TONSILLECTOM[facility]  1994    There were no vitals filed for this visit.      Subjective Assessment - 02/18/17 1622    Subjective Bothered me very little while traveling.  I did not do the exercises as much as I should have but it did pretty good   Limitations Walking;Sitting;Other (comment)   Patient Stated Goals no pain in hip when she stretches it   Currently in Pain? Yes   Pain Score 1    Pain Location Hip   Pain Orientation Right   Pain Descriptors / Indicators Dull   Pain Type Acute pain   Pain Onset More than a month ago   Aggravating Factors  standing and walking   Pain Relieving Factors  biofreeze and max freeze, arnica   Effect of Pain on Daily Activities standing   Multiple Pain Sites No                         [facility] Adult PT Treatment/Exercise - 02/18/17 0001      Knee/Hip Exercises: Stretches   Active Hamstring Stretch Right;Left;3 reps;30 seconds  supine with strap   Piriformis Stretch 30 seconds;3 reps   Piriformis Stretch Limitations supine   Other Knee/Hip Stretches R ITB standing - 3 x 20     Knee/Hip Exercises: Standing   Other Standing Knee Exercises hip 3 ways - one UE - 2x10     Knee/Hip Exercises: Seated   Clamshell with TheraBand Red  20x     Knee/Hip Exercises: Supine   Straight Leg Raises Strengthening;Right;Left;2 sets;10 reps     Knee/Hip Exercises: Sidelying   Hip ABduction Strengthening;Right;Left;20 reps                PT Education - 02/18/17 1703    Education provided Yes   Education Details HEP   Person(s) Educated Patient   Methods Explanation;Demonstration;Handout   Comprehension Verbalized understanding;Returned demonstration          PT Short Term Goals -  02-19-17 1638      PT SHORT TERM GOAL #1   Title pt will be independent with initial HEP   Time 4   Period Weeks   Status Achieved     PT SHORT TERM GOAL #2   Title pt reports 25% less right hip pain with daily activities   Time 4   Period Weeks   Status Achieved     PT SHORT TERM GOAL #3   Title able to perform single leg stand 5 seconds without Trendelenburg bilaterally   Time 4   Period Weeks   Status Partially Met           PT Long Term Goals - 02-19-2017 1639      PT LONG TERM GOAL #1   Title FOTO < or = to 35% limited   Baseline 30%   Time 8   Period Weeks   Status Achieved     PT LONG TERM GOAL #2   Title pain at least 50% improved in right hip   Baseline at least 50%   Time 8   Period Weeks   Status Achieved     PT LONG TERM GOAL #3   Title bilateral hip abduction improved to 4+/5 for reduced Trendelenburg in  gait   Time 8   Period Weeks   Status Achieved     PT LONG TERM GOAL #4   Title independent with advanced HEP   Time 8   Period Weeks   Status Achieved               Plan - 02-19-2017 1626    Clinical Impression Statement Patient needed visit today to review HEP and ensure successful transition to HEP.  She did well with exercises and has been successful performing exercises on her own at this time.  She was able to demonstrate good form with all exercises today.  She will discharge with HEP today   Rehab Potential Excellent   PT Treatment/Interventions ADLs/Self Care Home Management;Biofeedback;Cryotherapy;Electrical Stimulation;Iontophoresis 31m/ml Dexamethasone;Moist Heat;Ultrasound;Gait training;Stair training;Therapeutic activities;Therapeutic exercise;Balance training;Neuromuscular re-education;Manual techniques;Passive range of motion;Dry needling;Taping   PT Next Visit Plan discharge today   Consulted and Agree with Plan of Care Patient      Patient will benefit from skilled therapeutic intervention in order to improve the following deficits and impairments:  Abnormal gait, Decreased range of motion, Difficulty walking, Decreased strength, Pain, Increased muscle spasms, Postural dysfunction  Visit Diagnosis: Pain in right hip  Muscle weakness (generalized)  Other muscle spasm       G-Codes - [id number]    Functional Assessment Tool Used (Outpatient Only) FOTO and clinical impression   Functional Limitation Mobility: Walking and moving around   Mobility: Walking and Moving Around Current Status [phone] At least 20 percent but less than 40 percent impaired, limited or restricted   Mobility: Walking and Moving Around Goal Status [phone] At least 20 percent but less than 40 percent impaired, limited or restricted   Mobility: Walking and Moving Around Discharge Status [phone] At least 20 percent but less than 40 percent impaired, limited or restricted       Problem List Patient Active Problem List   Diagnosis Date Noted  . [place] 09/19/2014  . Disturbance of skin sensation 11/17/2013  . Coronary artery disease 03/31/2013  . Essential hypertension 03/31/2013  . Hyperlipidemia 03/31/2013    JZannie Cove PT 92018/09/14 5:04 PM   [facility] [address]  [place], [place], [address] Phone: [phone]   Fax:  [phone]  Name: Kendra [name] MRN: [id number] Date of Birth: Oct 11, 1935  PHYSICAL THERAPY DISCHARGE SUMMARY  Visits from Start of Care: 9  Current functional level related to goals / functional outcomes: See above goals   Remaining deficits: See above   Education / Equipment: HEP  Plan: Patient agrees to discharge.  Patient goals were partially met. Patient is being discharged due to being pleased with the current functional level.  ?????         Google, PT 02/18/17 5:05 PM

## 2017-02-19 ENCOUNTER — Ambulatory Visit: Payer: Medicare Other | Admitting: Physical Therapy

## 2017-02-19 NOTE — Addendum Note (Signed)
Addended by: Lovett Calender D on: 02/19/2017 11:48 AM   Modules accepted: Orders

## 2017-03-09 DIAGNOSIS — R0982 Postnasal drip: Secondary | ICD-10-CM | POA: Diagnosis not present

## 2017-03-09 DIAGNOSIS — R399 Unspecified symptoms and signs involving the genitourinary system: Secondary | ICD-10-CM | POA: Diagnosis not present

## 2017-03-17 DIAGNOSIS — Z23 Encounter for immunization: Secondary | ICD-10-CM | POA: Diagnosis not present

## 2017-03-18 DIAGNOSIS — G8929 Other chronic pain: Secondary | ICD-10-CM | POA: Diagnosis not present

## 2017-03-18 DIAGNOSIS — M170 Bilateral primary osteoarthritis of knee: Secondary | ICD-10-CM | POA: Diagnosis not present

## 2017-03-18 DIAGNOSIS — M25561 Pain in right knee: Secondary | ICD-10-CM | POA: Diagnosis not present

## 2017-03-18 DIAGNOSIS — M25562 Pain in left knee: Secondary | ICD-10-CM | POA: Diagnosis not present

## 2017-04-30 DIAGNOSIS — K5792 Diverticulitis of intestine, part unspecified, without perforation or abscess without bleeding: Secondary | ICD-10-CM | POA: Diagnosis not present

## 2017-05-04 DIAGNOSIS — R739 Hyperglycemia, unspecified: Secondary | ICD-10-CM | POA: Diagnosis not present

## 2017-05-04 DIAGNOSIS — L258 Unspecified contact dermatitis due to other agents: Secondary | ICD-10-CM | POA: Diagnosis not present

## 2017-05-04 DIAGNOSIS — I10 Essential (primary) hypertension: Secondary | ICD-10-CM | POA: Diagnosis not present

## 2017-05-04 DIAGNOSIS — E782 Mixed hyperlipidemia: Secondary | ICD-10-CM | POA: Diagnosis not present

## 2017-05-04 DIAGNOSIS — K5792 Diverticulitis of intestine, part unspecified, without perforation or abscess without bleeding: Secondary | ICD-10-CM | POA: Diagnosis not present

## 2017-05-04 DIAGNOSIS — I2510 Atherosclerotic heart disease of native coronary artery without angina pectoris: Secondary | ICD-10-CM | POA: Diagnosis not present

## 2017-05-04 DIAGNOSIS — B351 Tinea unguium: Secondary | ICD-10-CM | POA: Diagnosis not present

## 2017-05-11 DIAGNOSIS — I2510 Atherosclerotic heart disease of native coronary artery without angina pectoris: Secondary | ICD-10-CM | POA: Diagnosis not present

## 2017-05-11 DIAGNOSIS — B351 Tinea unguium: Secondary | ICD-10-CM | POA: Diagnosis not present

## 2017-05-11 DIAGNOSIS — L258 Unspecified contact dermatitis due to other agents: Secondary | ICD-10-CM | POA: Diagnosis not present

## 2017-05-11 DIAGNOSIS — R739 Hyperglycemia, unspecified: Secondary | ICD-10-CM | POA: Diagnosis not present

## 2017-05-11 DIAGNOSIS — K5792 Diverticulitis of intestine, part unspecified, without perforation or abscess without bleeding: Secondary | ICD-10-CM | POA: Diagnosis not present

## 2017-05-11 DIAGNOSIS — I10 Essential (primary) hypertension: Secondary | ICD-10-CM | POA: Diagnosis not present

## 2017-05-11 DIAGNOSIS — E782 Mixed hyperlipidemia: Secondary | ICD-10-CM | POA: Diagnosis not present

## 2017-05-18 DIAGNOSIS — H47011 Ischemic optic neuropathy, right eye: Secondary | ICD-10-CM | POA: Diagnosis not present

## 2017-05-18 DIAGNOSIS — H40013 Open angle with borderline findings, low risk, bilateral: Secondary | ICD-10-CM | POA: Diagnosis not present

## 2017-06-10 DIAGNOSIS — I10 Essential (primary) hypertension: Secondary | ICD-10-CM | POA: Diagnosis not present

## 2017-06-10 DIAGNOSIS — G459 Transient cerebral ischemic attack, unspecified: Secondary | ICD-10-CM | POA: Diagnosis not present

## 2017-06-10 DIAGNOSIS — J4541 Moderate persistent asthma with (acute) exacerbation: Secondary | ICD-10-CM | POA: Diagnosis not present

## 2017-06-10 DIAGNOSIS — E782 Mixed hyperlipidemia: Secondary | ICD-10-CM | POA: Diagnosis not present

## 2017-06-10 DIAGNOSIS — I2510 Atherosclerotic heart disease of native coronary artery without angina pectoris: Secondary | ICD-10-CM | POA: Diagnosis not present

## 2017-06-24 DIAGNOSIS — J4541 Moderate persistent asthma with (acute) exacerbation: Secondary | ICD-10-CM | POA: Diagnosis not present

## 2017-06-24 DIAGNOSIS — R0982 Postnasal drip: Secondary | ICD-10-CM | POA: Diagnosis not present

## 2017-06-24 DIAGNOSIS — I10 Essential (primary) hypertension: Secondary | ICD-10-CM | POA: Diagnosis not present

## 2017-06-24 DIAGNOSIS — M62838 Other muscle spasm: Secondary | ICD-10-CM | POA: Diagnosis not present

## 2017-07-01 ENCOUNTER — Ambulatory Visit (HOSPITAL_COMMUNITY)
Admission: RE | Admit: 2017-07-01 | Discharge: 2017-07-01 | Disposition: A | Discharge status: Home / Self Care | Payer: Medicare Other | Source: Ambulatory Visit | Attending: Cardiovascular Disease | Admitting: Cardiovascular Disease

## 2017-07-01 DIAGNOSIS — I10 Essential (primary) hypertension: Secondary | ICD-10-CM | POA: Diagnosis not present

## 2017-07-01 DIAGNOSIS — E785 Hyperlipidemia, unspecified: Secondary | ICD-10-CM | POA: Diagnosis not present

## 2017-07-01 DIAGNOSIS — I6523 Occlusion and stenosis of bilateral carotid arteries: Secondary | ICD-10-CM | POA: Diagnosis not present

## 2017-07-01 DIAGNOSIS — Z87891 Personal history of nicotine dependence: Secondary | ICD-10-CM | POA: Insufficient documentation

## 2017-07-01 DIAGNOSIS — I2510 Atherosclerotic heart disease of native coronary artery without angina pectoris: Secondary | ICD-10-CM | POA: Insufficient documentation

## 2017-07-01 DIAGNOSIS — I739 Peripheral vascular disease, unspecified: Secondary | ICD-10-CM

## 2017-07-01 DIAGNOSIS — I779 Disorder of arteries and arterioles, unspecified: Secondary | ICD-10-CM

## 2017-07-05 ENCOUNTER — Other Ambulatory Visit: Payer: Self-pay | Admitting: Cardiovascular Disease

## 2017-07-05 DIAGNOSIS — I6522 Occlusion and stenosis of left carotid artery: Secondary | ICD-10-CM

## 2017-07-13 DIAGNOSIS — G459 Transient cerebral ischemic attack, unspecified: Secondary | ICD-10-CM | POA: Diagnosis not present

## 2017-07-13 DIAGNOSIS — R739 Hyperglycemia, unspecified: Secondary | ICD-10-CM | POA: Diagnosis not present

## 2017-07-13 DIAGNOSIS — E782 Mixed hyperlipidemia: Secondary | ICD-10-CM | POA: Diagnosis not present

## 2017-07-13 DIAGNOSIS — J4541 Moderate persistent asthma with (acute) exacerbation: Secondary | ICD-10-CM | POA: Diagnosis not present

## 2017-07-13 DIAGNOSIS — I2510 Atherosclerotic heart disease of native coronary artery without angina pectoris: Secondary | ICD-10-CM | POA: Diagnosis not present

## 2017-07-13 DIAGNOSIS — I10 Essential (primary) hypertension: Secondary | ICD-10-CM | POA: Diagnosis not present

## 2017-08-09 DIAGNOSIS — M25519 Pain in unspecified shoulder: Secondary | ICD-10-CM | POA: Diagnosis not present

## 2017-08-09 DIAGNOSIS — H1130 Conjunctival hemorrhage, unspecified eye: Secondary | ICD-10-CM | POA: Diagnosis not present

## 2017-08-17 ENCOUNTER — Ambulatory Visit: Payer: Medicare Other | Attending: Family Medicine | Admitting: Physical Therapy

## 2017-08-17 ENCOUNTER — Other Ambulatory Visit: Payer: Self-pay

## 2017-08-17 ENCOUNTER — Encounter: Payer: Self-pay | Admitting: Physical Therapy

## 2017-08-17 DIAGNOSIS — M25612 Stiffness of left shoulder, not elsewhere classified: Secondary | ICD-10-CM | POA: Diagnosis not present

## 2017-08-17 DIAGNOSIS — M25512 Pain in left shoulder: Secondary | ICD-10-CM | POA: Insufficient documentation

## 2017-08-17 DIAGNOSIS — M62838 Other muscle spasm: Secondary | ICD-10-CM | POA: Insufficient documentation

## 2017-08-17 DIAGNOSIS — M25551 Pain in right hip: Secondary | ICD-10-CM | POA: Insufficient documentation

## 2017-08-17 DIAGNOSIS — M25511 Pain in right shoulder: Secondary | ICD-10-CM

## 2017-08-17 DIAGNOSIS — M25611 Stiffness of right shoulder, not elsewhere classified: Secondary | ICD-10-CM

## 2017-08-17 DIAGNOSIS — M6281 Muscle weakness (generalized): Secondary | ICD-10-CM | POA: Diagnosis not present

## 2017-08-17 NOTE — Patient Instructions (Addendum)
Kendra [name] PT [facility] [address], [address] [place], [place] [address] Phone # [phone] Fax [phone]    

## 2017-08-17 NOTE — Therapy (Signed)
[facility] [address] [place], [place], [address] Phone: [phone]   Fax:  [phone]  Physical Therapy Evaluation  Patient Details  Name: Kendra [name] MRN: [id number] Date of Birth: 03/28/1936 Referring Provider: Dr. Lawerance Cruel   Encounter Date: 08/17/2017  PT End of Session - 08/17/17 1911    Visit Number  1    Date for PT Re-Evaluation  10/12/17    Authorization Type  Medicare KX at visit 15    PT Start Time  1055    PT Stop Time  1140    PT Time Calculation (min)  45 min    Activity Tolerance  Patient tolerated treatment well       Past Medical History:  Diagnosis Date  . Asthma   . CAD (coronary artery disease)    last cath 08/2006  . Carotid artery disease (HCC)    moderate left ICA stenosis by 2.6 ultrasound  . DJD (degenerative joint disease), lumbar   . Hearing loss   . HTN (hypertension)   . Hyperlipidemia   . Vision loss of right eye     Past Surgical History:  Procedure Laterality Date  . ABDOMINAL HYSTERECTOMY  1975  . APPENDECTOMY    . CORONARY ANGIOPLASTY WITH STENT PLACEMENT  08/2006   taxus stent x2 to LAD, 2.5x72m and 2.[facility]x860m [facility]  [facility]    There were no vitals filed for this visit.   Subjective Assessment - 08/17/17 1102    Subjective  Bilateral shoulder pain Left worse than right; no specific injury;  pain about 3 months.   Taking off an pullover shirt hurts.  Yesterday when bend elbow it pulls.  Affects sleep.  Hurts with lying on that side.      Pertinent History  Cardiac stents 2008    Limitations  House hold activities    Diagnostic tests  none    Patient Stated Goals  Get rid of muscle spasm;  learn what I can do to strengthen muscles around joints    Currently in Pain?  Yes    Pain Score  7  with movement    Pain Location  Shoulder    Pain Orientation   Right;Left    Pain Type  Chronic pain    Pain Onset  More than a month ago    Pain Frequency  Intermittent    Aggravating Factors   reaching overhead;  taking off shirt;  hard to hook bra    Pain Relieving Factors  Biofreeze         [facility] PT Assessment - 08/17/17 0001      Assessment   Medical Diagnosis  bil shoulder pain    Referring Provider  Dr. ChLawerance Cruel  Onset Date/Surgical Date  -- 3 months    Hand Dominance  Right    Next MD Visit  as needed    Prior Therapy  not for shoulder, neck      Precautions   Precautions  None      Restrictions   Weight Bearing Restrictions  No      Balance Screen   Has the patient fallen in the past 6 months  No    Has the patient had a decrease in activity level because of a fear of falling?   No    Is the patient reluctant to  leave their home because of a fear of falling?   No      Home Film/video editor residence    Living Arrangements  Spouse/significant other    Available Help at Discharge  Family      Prior Function   Level of Independence  Independent    Vocation  Retired    Leisure  Tai Chi; travel; walk 1-2 miles; Engineer, [employer]      Observation/Other Assessments   Focus on Therapeutic Outcomes (FOTO)   48% limitation       Posture/Postural Control   Postural Limitations  Rounded Shoulders;Forward head    Posture Comments  Lack of elbow extension bilaterally      AROM   Right Shoulder Flexion  130 Degrees    Right Shoulder ABduction  137 Degrees    Right Shoulder Internal Rotation  -- T8    Right Shoulder External Rotation  37 Degrees    Left Shoulder Flexion  127 Degrees    Left Shoulder ABduction  113 Degrees    Left Shoulder Internal Rotation  -- T10    Left Shoulder External Rotation  30 Degrees      Strength   Right Shoulder Flexion  4-/5    Right Shoulder Extension  4-/5    Right Shoulder ABduction  4-/5    Right Shoulder Internal Rotation  4-/5    Right  Shoulder External Rotation  4-/5    Left Shoulder Flexion  3+/5    Left Shoulder Extension  4-/5    Left Shoulder ABduction  3+/5    Left Shoulder Internal Rotation  3/5    Left Shoulder External Rotation  3/5      Palpation   Palpation comment  right shoulder clunk       Hawkins-Kennedy test   Findings  Positive      Empty Can test   Findings  Positive    Comment  Bil      Lag time at 0 degrees   Findings  Positive    Side  Left      Drop Arm test   Findings  Negative             Objective measurements completed on examination: See above findings.              PT Education - 08/17/17 1910    Education provided  Yes    Education Details  supine cane flexion stretching often with long hold times;  effect of posture on glenohumeral alignment     Person(s) Educated  Patient    Methods  Explanation;Demonstration;Handout    Comprehension  Returned demonstration;Verbalized understanding       PT Short Term Goals - 08/17/17 1925      PT SHORT TERM GOAL #1   Title  pt will be independent with initial HEP for bilateral shoulder ROM    Time  4    Period  Weeks    Status  New    Target Date  09/14/17      PT SHORT TERM GOAL #2   Title  pt reports 25% less bil shoulder pain with daily activities including reaching, dressing    Time  4    Period  Weeks    Status  New      PT SHORT TERM GOAL #3   Title  The patient will have bil shoulder flexion to 140 degrees needed for reaching above head  level     Time  4    Period  Weeks    Status  New      PT SHORT TERM GOAL #4   Title  Patient will have glenohumeral and scapular strength to lift a 1# object to an eye level shelf with minimal discomfort    Time  4    Period  Weeks    Status  New        PT Long Term Goals - 08/17/17 1928      PT LONG TERM GOAL #1   Title  The patient will be independent with safe self progression of HEP    Time  8    Period  Weeks    Status  New    Target Date  10/12/17       PT LONG TERM GOAL #2   Title  pain at least 50% improved in bilateral shoulders with dressing and reaching overhead    Time  8    Period  Weeks    Status  New      PT LONG TERM GOAL #3   Title  Bilateral shoulder strength grossly 4/5 needed for lifting a pot or dish with greater ease     Time  8    Period  Weeks    Status  New      PT LONG TERM GOAL #4   Title  Patient will have improved shoulder flexion/scaption to 150 degrees and external rotation to 40 degrees needed for reaching and dressing    Time  8    Period  Weeks    Status  New      PT LONG TERM GOAL #5   Title  FOTO functional outcome score improved from 48% limitation to 36% indicating improved function with less pain    Time  8    Period  Weeks    Status  New             Plan - 08/17/17 1914    Clinical Impression Statement  The patient complains of bilateral shoulder pain left worse than right for the past 3 months.   She denies any specific injury.  She is especially painful with reaching overhead,  taking off a pullover shirt and  lying on that side.  She is painful and limited with all shoulder ROM:  right flex 130, abduction 137, external rotation 37;  left flexion 127, abduction 113, external rotation 30 degrees.  Strength on right grossly 3+/5 glenohumeral strength and left grossly 4-/5.  Patient lacks full elbow extension bilaterally.  + Michel Bickers, empty can and lag signs bilaterally.  She would benefit from PT to address these deficts.      History and Personal Factors relevant to plan of care:  good home support;  active lifestyle Tai Chi 2x/week;  limited co-morbidities    Clinical Presentation  Stable    Clinical Decision Making  Low    Rehab Potential  Good    Clinical Impairments Affecting Rehab Potential  none    PT Frequency  2x / week    PT Duration  8 weeks    PT Treatment/Interventions  ADLs/Self Care Home Management;Iontophoresis 43m/ml Dexamethasone;Dry needling;Moist  Heat;Ultrasound;Electrical Stimulation;Therapeutic exercise;Therapeutic activities;Patient/family education;Manual techniques;Taping;Neuromuscular re-education    PT Next Visit Plan  ionto if cert signed;  bil shoulder ROM; glenohumeral and scapular strengthening;   bil elbow extension ROM;  UE Ranger; pulleys;  review cane flexion from initial HEP  Patient will benefit from skilled therapeutic intervention in order to improve the following deficits and impairments:  Pain, Postural dysfunction, Decreased range of motion, Decreased strength, Impaired UE functional use  Visit Diagnosis: Stiffness of left shoulder, not elsewhere classified - Plan: PT plan of care cert/re-cert  Acute pain of right shoulder - Plan: PT plan of care cert/re-cert  Acute pain of left shoulder - Plan: PT plan of care cert/re-cert  Stiffness of right shoulder, not elsewhere classified - Plan: PT plan of care cert/re-cert  Muscle weakness (generalized) - Plan: PT plan of care cert/re-cert     Problem List Patient Active Problem List   Diagnosis Date Noted  . Carotid artery disease ([place]) 09/19/2014  . Disturbance of skin sensation 11/17/2013  . Coronary artery disease 03/31/2013  . Essential hypertension 03/31/2013  . Hyperlipidemia 03/31/2013   Ruben Im, PT 08/17/17 7:35 PM Phone: [phone] Fax: [phone]  Kendra [name] 08/17/2017, 7:34 PM  [place] [address] [place], [place], [address] Phone: [phone]   Fax:  [phone]  Name: Kendra [name] MRN: [id number] Date of Birth: [birth year]

## 2017-08-25 ENCOUNTER — Encounter: Payer: Self-pay | Admitting: Physical Therapy

## 2017-08-25 ENCOUNTER — Ambulatory Visit: Payer: Medicare Other | Admitting: Physical Therapy

## 2017-08-25 DIAGNOSIS — M62838 Other muscle spasm: Secondary | ICD-10-CM

## 2017-08-25 DIAGNOSIS — M25512 Pain in left shoulder: Secondary | ICD-10-CM

## 2017-08-25 DIAGNOSIS — M6281 Muscle weakness (generalized): Secondary | ICD-10-CM | POA: Diagnosis not present

## 2017-08-25 DIAGNOSIS — M25511 Pain in right shoulder: Secondary | ICD-10-CM

## 2017-08-25 DIAGNOSIS — M25551 Pain in right hip: Secondary | ICD-10-CM

## 2017-08-25 DIAGNOSIS — M25612 Stiffness of left shoulder, not elsewhere classified: Secondary | ICD-10-CM

## 2017-08-25 DIAGNOSIS — M25611 Stiffness of right shoulder, not elsewhere classified: Secondary | ICD-10-CM | POA: Diagnosis not present

## 2017-08-25 NOTE — Therapy (Signed)
[facility] [address] [place], [place], [address] Phone: [phone]   Fax:  [phone]  Physical Therapy Treatment  Patient Details  Name: Kendra [name] MRN: [id number] Date of Birth: 03/07/1936 Referring Provider: Dr. Lawerance Cruel   Encounter Date: 08/25/2017  PT End of Session - 08/25/17 1142    Visit Number  2    Date for PT Re-Evaluation  10/12/17    Authorization Type  Medicare KX at visit 15    PT Start Time  1100    PT Stop Time  1140    PT Time Calculation (min)  40 min    Activity Tolerance  Patient tolerated treatment well    Behavior During Therapy  [facility] for tasks assessed/performed       Past Medical History:  Diagnosis Date  . Asthma   . CAD (coronary artery disease)    last cath 08/2006  . Carotid artery disease (HCC)    moderate left ICA stenosis by 2.6 ultrasound  . DJD (degenerative joint disease), lumbar   . Hearing loss   . HTN (hypertension)   . Hyperlipidemia   . Vision loss of right eye     Past Surgical History:  Procedure Laterality Date  . ABDOMINAL HYSTERECTOMY  1975  . APPENDECTOMY    . CORONARY ANGIOPLASTY WITH STENT PLACEMENT  08/2006   taxus stent x2 to LAD, 2.5x57mm and 2.5x71mm; done at [facility]  . RENAL ANGIOGRAM  [facility] in [place]  . TONSILLECTOMY  1994    There were no vitals filed for this visit.  Subjective Assessment - 08/25/17 1105    Subjective  I felt okay after the evaluation.  I found a barbecue utensils to use for cane exercise.     Pertinent History  Cardiac stents 2008    Diagnostic tests  none    Patient Stated Goals  Get rid of muscle spasm;  learn what I can do to strengthen muscles around joints    Currently in Pain?  Yes    Pain Score  6  with movement    Pain Location  Shoulder    Pain Orientation  Right;Left    Pain Descriptors / Indicators  Aching;Sharp    Pain Type  Chronic pain    Pain Onset  More  than a month ago    Pain Frequency  Intermittent    Aggravating Factors   reaching overhead; taking off shirt; hard to hook bra    Pain Relieving Factors  biofreeze    Multiple Pain Sites  No                      [facility] Adult PT Treatment/Exercise - 08/25/17 0001      Exercises   Exercises  Shoulder      Shoulder Exercises: Supine   External Rotation  Strengthening;Both;10 reps;Theraband    Theraband Level (Shoulder External Rotation)  Level 1 (Yellow)    External Rotation Limitations  elbows at side      Shoulder Exercises: Standing   Extension  AAROM;Both;20 reps using a cane    Retraction  Strengthening;Both;15 reps;Theraband    Theraband Level (Shoulder Retraction)  Level 1 (Yellow)    Other Standing Exercises  shoulder extension with yellow theraband 2x10      Shoulder Exercises: Pulleys   Flexion  3 minutes after 2 min right wrist was bothering her      Shoulder Exercises: ROM/Strengthening  Other ROM/Strengthening Exercises  UE ranger for shoulder flexion 10x each monitoring for pain    Other ROM/Strengthening Exercises  supine cane ex-flexion, horizontal abduction and adduction, ER at side, 1# on cane and push up and down 10x VC on technique               PT Short Term Goals - 08/17/17 1925      PT SHORT TERM GOAL #1   Title  pt will be independent with initial HEP for bilateral shoulder ROM    Time  4    Period  Weeks    Status  New    Target Date  09/14/17      PT SHORT TERM GOAL #2   Title  pt reports 25% less bil shoulder pain with daily activities including reaching, dressing    Time  4    Period  Weeks    Status  New      PT SHORT TERM GOAL #3   Title  The patient will have bil shoulder flexion to 140 degrees needed for reaching above head level     Time  4    Period  Weeks    Status  New      PT SHORT TERM GOAL #4   Title  Patient will have glenohumeral and scapular strength to lift a 1# object to an eye level shelf with  minimal discomfort    Time  4    Period  Weeks    Status  New        PT Long Term Goals - 08/17/17 1928      PT LONG TERM GOAL #1   Title  The patient will be independent with safe self progression of HEP    Time  8    Period  Weeks    Status  New    Target Date  10/12/17      PT LONG TERM GOAL #2   Title  pain at least 50% improved in bilateral shoulders with dressing and reaching overhead    Time  8    Period  Weeks    Status  New      PT LONG TERM GOAL #3   Title  Bilateral shoulder strength grossly 4/5 needed for lifting a pot or dish with greater ease     Time  8    Period  Weeks    Status  New      PT LONG TERM GOAL #4   Title  Patient will have improved shoulder flexion/scaption to 150 degrees and external rotation to 40 degrees needed for reaching and dressing    Time  8    Period  Weeks    Status  New      PT LONG TERM GOAL #5   Title  FOTO functional outcome score improved from 48% limitation to 36% indicating improved function with less pain    Time  8    Period  Weeks    Status  New            Plan - 08/25/17 1126    Clinical Impression Statement  Patient had questions on her HEP. During shoulder extension needs tactile cues to bring shoulder blades together. Patient needs verbal cues to remind her of her technique.  Patient needs verbal cues to work in stretching discomfort and not pain.  Patient will benefit from skilled  therapy to improve shoulder strength and mobility while reducing pain.  Rehab Potential  Good    Clinical Impairments Affecting Rehab Potential  none    PT Frequency  2x / week    PT Duration  8 weeks    PT Treatment/Interventions  ADLs/Self Care Home Management;Iontophoresis 4mg /ml Dexamethasone;Dry needling;Moist Heat;Ultrasound;Electrical Stimulation;Therapeutic exercise;Therapeutic activities;Patient/family education;Manual techniques;Taping;Neuromuscular re-education    PT Next Visit Plan  ionto if cert signed;   glenohumeral and scapular strengthening;   bil elbow extension ROM;  UE Ranger; pulleys; progress HEP    PT Home Exercise Plan  progress as needed    Recommended Other Services  MD has not signed the initial eval yet    Consulted and Agree with Plan of Care  Patient       Patient will benefit from skilled therapeutic intervention in order to improve the following deficits and impairments:  Pain, Postural dysfunction, Decreased range of motion, Decreased strength, Impaired UE functional use  Visit Diagnosis: Stiffness of left shoulder, not elsewhere classified  Acute pain of right shoulder  Acute pain of left shoulder  Pain in right hip  Other muscle spasm     Problem List Patient Active Problem List   Diagnosis Date Noted  . Carotid artery disease ([place]) 09/19/2014  . Disturbance of skin sensation 11/17/2013  . Coronary artery disease 03/31/2013  . Essential hypertension 03/31/2013  . Hyperlipidemia 03/31/2013    Earlie Counts, PT 08/25/17 11:43 AM   [facility] [address], [place], [place], [address] Phone: [phone]   Fax:  [phone]  Name: Kendra [name] MRN: [id number] Date of Birth: 12-12-1935

## 2017-08-27 ENCOUNTER — Encounter: Payer: Self-pay | Admitting: Physical Therapy

## 2017-08-27 ENCOUNTER — Ambulatory Visit: Payer: Medicare Other | Admitting: Physical Therapy

## 2017-08-27 DIAGNOSIS — M6281 Muscle weakness (generalized): Secondary | ICD-10-CM

## 2017-08-27 DIAGNOSIS — M25511 Pain in right shoulder: Secondary | ICD-10-CM

## 2017-08-27 DIAGNOSIS — M25512 Pain in left shoulder: Secondary | ICD-10-CM

## 2017-08-27 DIAGNOSIS — M62838 Other muscle spasm: Secondary | ICD-10-CM | POA: Diagnosis not present

## 2017-08-27 DIAGNOSIS — M25612 Stiffness of left shoulder, not elsewhere classified: Secondary | ICD-10-CM

## 2017-08-27 DIAGNOSIS — M25611 Stiffness of right shoulder, not elsewhere classified: Secondary | ICD-10-CM | POA: Diagnosis not present

## 2017-08-27 NOTE — Patient Instructions (Addendum)
ROM: Extension - Wand (Standing)    Stand holding wand behind back. Raise arms as far as possible. Repeat _10___ times per set. Do __1__ sets per session. Do __1__ sessions per day.  http://orth.exer.us/930   Copyright  VHI. All rights reserved.  ROM: Flexion - Wand (Supine)    Lie on back holding wand. Raise arms over head.  Repeat __10__ times per set. Do __1__ sets per session. Do __1__ sessions per day.  http://orth.exer.us/928   Copyright  VHI. All rights reserved.  ROM: Horizontal Abduction / Adduction - Wand    Keeping both palms down, push right hand across body with other hand. Then pull back across body, keeping arms parallel to floor. Do not allow trunk to twist. Hold __1__ seconds. Repeat __10__ times per set. Do __1__ sets per session. Do ___1_ sessions per day.  http://orth.exer.us/752   Copyright  VHI. All rights reserved.  SHOULDER: External Rotation - Supine (Cane)    Hold cane with both hands. Rotate arm away from body. Keep elbow on floor and next to body. _10__ reps per set, _1__ sets per day,  Add towel to keep elbow at side.  Copyright  VHI. All rights reserved.  Scapular Retraction: Rowing (Eccentric) - Arms - Side (Resistance Band)    Hold end of band in each hand. Pull back until elbows are even with trunk. Keep elbows by sides, thumbs up. Slowly release for 3-5 seconds. Use __red______ resistance band. _10__ reps per set, _1__ sets per day,    http://ecce.exer.us/227   Copyright  VHI. All rights reserved.  EXTENSION: Standing - Resistance Band: Stable (Active)    Stand, both arms at side. Against red resistance band, draw arm backward, as far as possible, keeping elbow straight. Complete __1_ sets of __10_ repetitions. Perform _1__ sessions per day.  Copyright  VHI. All rights reserved.   [place] [address], [place], [place] [address] Phone # [phone] Fax [phone]

## 2017-08-27 NOTE — Therapy (Signed)
[facility] [address], [place], [place], [address] Phone: [phone]   Fax:  [phone]  Physical Therapy Treatment  Patient Details  Name: Kendra [name] MRN: [id number] Date of Birth: February 25, 1936 Referring Provider: Dr. Lawerance Cruel   Encounter Date: 08/27/2017  PT End of Session - 08/27/17 1118    Visit Number  3    Date for PT Re-Evaluation  10/12/17    PT Start Time  9892    PT Stop Time  1100    PT Time Calculation (min)  45 min    Activity Tolerance  Patient tolerated treatment well    Behavior During Therapy  [facility] for tasks assessed/performed       Past Medical History:  Diagnosis Date  . Asthma   . CAD (coronary artery disease)    last cath 08/2006  . Carotid artery disease (HCC)    moderate left ICA stenosis by 2.6 ultrasound  . DJD (degenerative joint disease), lumbar   . Hearing loss   . HTN (hypertension)   . Hyperlipidemia   . Vision loss of right eye     Past Surgical History:  Procedure Laterality Date  . ABDOMINAL HYSTERECTOMY  1975  . APPENDECTOMY    . CORONARY ANGIOPLASTY WITH STENT PLACEMENT  08/2006   taxus stent x2 to LAD, 2.5x62mm and 2.5x69mm; done at [facility]  . RENAL ANGIOGRAM  [facility] in [place]  . TONSILLECTOMY  1994    There were no vitals filed for this visit.  Subjective Assessment - 08/27/17 1021    Subjective  I felt like I had a work out from the last visit.     Pertinent History  Cardiac stents 2008    Limitations  House hold activities    Diagnostic tests  none    Patient Stated Goals  Get rid of muscle spasm;  learn what I can do to strengthen muscles around joints    Currently in Pain?  Yes    Pain Score  6     Pain Location  Shoulder    Pain Orientation  Right;Left    Pain Descriptors / Indicators  Aching;Sharp    Pain Type  Chronic pain    Pain Onset  More than a month ago    Pain Frequency  Intermittent    Aggravating  Factors   reaching overhead; taking off shirt; hard to hook bra    Pain Relieving Factors  biofreeze    Multiple Pain Sites  No                No data recorded       [facility] Adult PT Treatment/Exercise - 08/27/17 0001      Shoulder Exercises: Pulleys   Flexion  2 minutes    ABduction  2 minutes      Manual Therapy   Manual Therapy  Joint mobilization;Passive ROM;Soft tissue mobilization    Joint Mobilization  lateral glide, inferior glide, distraction grade 3 to bilatereal shoulders    Soft tissue mobilization  lats, pectoralis bilaterally    Passive ROM  to bilateral shoulders for flexion, abduction, ER             PT Education - 08/27/17 1118    Education provided  Yes    Education Details  cane exercises and red theraband exercises for scapula strength    Person(s) Educated  Patient    Methods  Explanation;Demonstration;Verbal cues;Handout  Comprehension  Returned demonstration;Verbalized understanding       PT Short Term Goals - 08/27/17 1130      PT SHORT TERM GOAL #1   Title  pt will be independent with initial HEP for bilateral shoulder ROM    Time  4    Period  Weeks    Status  Achieved      PT SHORT TERM GOAL #2   Title  pt reports 25% less bil shoulder pain with daily activities including reaching, dressing    Time  4    Period  Weeks    Status  On-going      PT SHORT TERM GOAL #3   Title  The patient will have bil shoulder flexion to 140 degrees needed for reaching above head level     Time  4    Period  Weeks    Status  On-going      PT SHORT TERM GOAL #4   Title  Patient will have glenohumeral and scapular strength to lift a 1# object to an eye level shelf with minimal discomfort    Time  4    Period  Weeks    Status  On-going        PT Long Term Goals - 08/17/17 1928      PT LONG TERM GOAL #1   Title  The patient will be independent with safe self progression of HEP    Time  8    Period  Weeks    Status  New    Target  Date  10/12/17      PT LONG TERM GOAL #2   Title  pain at least 50% improved in bilateral shoulders with dressing and reaching overhead    Time  8    Period  Weeks    Status  New      PT LONG TERM GOAL #3   Title  Bilateral shoulder strength grossly 4/5 needed for lifting a pot or dish with greater ease     Time  8    Period  Weeks    Status  New      PT LONG TERM GOAL #4   Title  Patient will have improved shoulder flexion/scaption to 150 degrees and external rotation to 40 degrees needed for reaching and dressing    Time  8    Period  Weeks    Status  New      PT LONG TERM GOAL #5   Title  FOTO functional outcome score improved from 48% limitation to 36% indicating improved function with less pain    Time  8    Period  Weeks    Status  New            Plan - 08/27/17 1119    Clinical Impression Statement  Patient had improved ROM and decreased pain after the manaul skills. Patient is able to perform scapula exercises correctly but does better with tubes on the theraband to grip it.  Patient will benefit from skilled therapy to improve shoulder strength and mobility while reducing pain.     Rehab Potential  Good    Clinical Impairments Affecting Rehab Potential  none    PT Frequency  2x / week    PT Duration  8 weeks    PT Treatment/Interventions  ADLs/Self Care Home Management;Iontophoresis 4mg /ml Dexamethasone;Dry needling;Moist Heat;Ultrasound;Electrical Stimulation;Therapeutic exercise;Therapeutic activities;Patient/family education;Manual techniques;Taping;Neuromuscular re-education    PT Next Visit Plan  ionto if cert signed;  glenohumeral and scapular strengthening;   bil elbow extension ROM;  UE Ranger; pulleys; progress HEP;     PT Home Exercise Plan  progress as needed    Consulted and Agree with Plan of Care  Patient       Patient will benefit from skilled therapeutic intervention in order to improve the following deficits and impairments:  Pain, Postural  dysfunction, Decreased range of motion, Decreased strength, Impaired UE functional use  Visit Diagnosis: Stiffness of left shoulder, not elsewhere classified  Acute pain of right shoulder  Acute pain of left shoulder  Stiffness of right shoulder, not elsewhere classified  Muscle weakness (generalized)  Other muscle spasm     Problem List Patient Active Problem List   Diagnosis Date Noted  . Carotid artery disease ([place]) 09/19/2014  . Disturbance of skin sensation 11/17/2013  . Coronary artery disease 03/31/2013  . Essential hypertension 03/31/2013  . Hyperlipidemia 03/31/2013    Earlie Counts, PT 08/27/17 11:31 AM   [facility] [address], [place], [place], [address] Phone: [phone]   Fax:  [phone]  Name: Kendra [name] MRN: [id number] Date of Birth: [birth year]

## 2017-08-31 ENCOUNTER — Ambulatory Visit: Payer: Medicare Other | Admitting: Physical Therapy

## 2017-08-31 ENCOUNTER — Encounter: Payer: Self-pay | Admitting: Physical Therapy

## 2017-08-31 DIAGNOSIS — M62838 Other muscle spasm: Secondary | ICD-10-CM | POA: Diagnosis not present

## 2017-08-31 DIAGNOSIS — M25611 Stiffness of right shoulder, not elsewhere classified: Secondary | ICD-10-CM

## 2017-08-31 DIAGNOSIS — M25511 Pain in right shoulder: Secondary | ICD-10-CM

## 2017-08-31 DIAGNOSIS — M6281 Muscle weakness (generalized): Secondary | ICD-10-CM

## 2017-08-31 DIAGNOSIS — M25612 Stiffness of left shoulder, not elsewhere classified: Secondary | ICD-10-CM

## 2017-08-31 DIAGNOSIS — M25512 Pain in left shoulder: Secondary | ICD-10-CM | POA: Diagnosis not present

## 2017-08-31 NOTE — Therapy (Signed)
[facility] [address] [place], [place], [address] Phone: [phone]   Fax:  [phone]  Physical Therapy Treatment  Patient Details  Name: Kendra [name] MRN: [id number] Date of Birth: 1935-09-30 Referring Provider: Dr. Lawerance Cruel   Encounter Date: 08/31/2017  PT End of Session - 08/31/17 1924    Visit Number  4    Date for PT Re-Evaluation  10/12/17    Authorization Type  Medicare KX at visit 15    PT Start Time  3235    PT Stop Time  1530    PT Time Calculation (min)  45 min    Activity Tolerance  Patient tolerated treatment well       Past Medical History:  Diagnosis Date  . Asthma   . CAD (coronary artery disease)    last cath 08/2006  . Carotid artery disease (HCC)    moderate left ICA stenosis by 2.6 ultrasound  . DJD (degenerative joint disease), lumbar   . Hearing loss   . HTN (hypertension)   . Hyperlipidemia   . Vision loss of right eye     Past Surgical History:  Procedure Laterality Date  . ABDOMINAL HYSTERECTOMY  1975  . APPENDECTOMY    . CORONARY ANGIOPLASTY WITH STENT PLACEMENT  08/2006   taxus stent x2 to LAD, 2.5x61mm and 2.5x60mm; done at [facility]  . RENAL ANGIOGRAM  [facility] in [place]  . TONSILLECTOMY  1994    There were no vitals filed for this visit.  Subjective Assessment - 08/31/17 1450    Subjective  6/10 pain on left;  on right 3/10;  I feel better after therapy.  My home exercises are going OK.      Currently in Pain?  Yes    Pain Score  6     Pain Location  Shoulder    Pain Orientation  Left;Right    Pain Type  Chronic pain    Aggravating Factors   reaching up                 No data recorded       [facility] Adult PT Treatment/Exercise - 08/31/17 0001      Therapeutic Activites    Therapeutic Activities  ADL's    ADL's  reaching, pulling      Shoulder Exercises: Supine   Protraction  Strengthening;Right;Left;10  reps light manual resistance    Other Supine Exercises  bent arm elevation, straighten elbow, flexion and resisted lowering 8x right/left      Shoulder Exercises: Standing   Extension  Strengthening;Right;Left;10 reps;Theraband    Theraband Level (Shoulder Extension)  Level 2 (Red)    Row  Strengthening;Right;Left;10 reps;Theraband    Theraband Level (Shoulder Row)  Level 2 (Red)      Shoulder Exercises: ROM/Strengthening   Other ROM/Strengthening Exercises  UE ranger on floor and on wall L0 10x each bil  for shoulder flexion  monitoring for pain      Manual Therapy   Joint Mobilization  Glenohumeral joint distraction grade 2 right/left 5x    Soft tissue mobilization  deltoids, triceps, persiscapular muscles bil     Passive ROM  flexion 10x bil    Kinesiotex  Facilitate Muscle      Kinesiotix   Facilitate Muscle   3 strips: 2 vertical anterior and posterior deltoid and 1 strip horizontal across middle deltoid  PT Short Term Goals - 08/27/17 1130      PT SHORT TERM GOAL #1   Title  pt will be independent with initial HEP for bilateral shoulder ROM    Time  4    Period  Weeks    Status  Achieved      PT SHORT TERM GOAL #2   Title  pt reports 25% less bil shoulder pain with daily activities including reaching, dressing    Time  4    Period  Weeks    Status  On-going      PT SHORT TERM GOAL #3   Title  The patient will have bil shoulder flexion to 140 degrees needed for reaching above head level     Time  4    Period  Weeks    Status  On-going      PT SHORT TERM GOAL #4   Title  Patient will have glenohumeral and scapular strength to lift a 1# object to an eye level shelf with minimal discomfort    Time  4    Period  Weeks    Status  On-going        PT Long Term Goals - 08/17/17 1928      PT LONG TERM GOAL #1   Title  The patient will be independent with safe self progression of HEP    Time  8    Period  Weeks    Status  New    Target Date   10/12/17      PT LONG TERM GOAL #2   Title  pain at least 50% improved in bilateral shoulders with dressing and reaching overhead    Time  8    Period  Weeks    Status  New      PT LONG TERM GOAL #3   Title  Bilateral shoulder strength grossly 4/5 needed for lifting a pot or dish with greater ease     Time  8    Period  Weeks    Status  New      PT LONG TERM GOAL #4   Title  Patient will have improved shoulder flexion/scaption to 150 degrees and external rotation to 40 degrees needed for reaching and dressing    Time  8    Period  Weeks    Status  New      PT LONG TERM GOAL #5   Title  FOTO functional outcome score improved from 48% limitation to 36% indicating improved function with less pain    Time  8    Period  Weeks    Status  New            Plan - 08/31/17 1924    Clinical Impression Statement  The patient reports improved improved ROM following soft tissue manual therapy especially on triceps.  She also has decreased pain with kinesiotape on left shoulder while doing her resistive band exercises.  Therapist closely monitoring response with interventions.      Rehab Potential  Good    Clinical Impairments Affecting Rehab Potential  none    PT Frequency  2x / week    PT Duration  8 weeks    PT Treatment/Interventions  ADLs/Self Care Home Management;Iontophoresis 4mg /ml Dexamethasone;Dry needling;Moist Heat;Ultrasound;Electrical Stimulation;Therapeutic exercise;Therapeutic activities;Patient/family education;Manual techniques;Taping;Neuromuscular re-education    PT Next Visit Plan  ionto if cert signed;  check response to KT left shoulder;  recheck ROM;  glenohumeral and scapular strengthening;   bil  elbow extension ROM;  UE Ranger; pulleys; progress HEP;     Recommended Other Services  2nd attempt for MD signature certification rerouted       Patient will benefit from skilled therapeutic intervention in order to improve the following deficits and impairments:  Pain,  Postural dysfunction, Decreased range of motion, Decreased strength, Impaired UE functional use  Visit Diagnosis: Stiffness of left shoulder, not elsewhere classified  Acute pain of right shoulder  Acute pain of left shoulder  Stiffness of right shoulder, not elsewhere classified  Muscle weakness (generalized)     Problem List Patient Active Problem List   Diagnosis Date Noted  . Carotid artery disease ([place]) 09/19/2014  . Disturbance of skin sensation 11/17/2013  . Coronary artery disease 03/31/2013  . Essential hypertension 03/31/2013  . Hyperlipidemia 03/31/2013  Ruben Im, PT 08/31/17 7:31 PM Phone: [phone] Fax: [phone] Alvera Singh 08/31/2017, 7:31 PM  [facility] [address], [place], [place], [address] Phone: [phone]   Fax:  [phone]  Name: Cam Harnden MRN: [id number] Date of Birth: [birth year]

## 2017-09-02 ENCOUNTER — Ambulatory Visit: Payer: Medicare Other | Admitting: Physical Therapy

## 2017-09-02 DIAGNOSIS — M25512 Pain in left shoulder: Secondary | ICD-10-CM | POA: Diagnosis not present

## 2017-09-02 DIAGNOSIS — M25511 Pain in right shoulder: Secondary | ICD-10-CM

## 2017-09-02 DIAGNOSIS — M6281 Muscle weakness (generalized): Secondary | ICD-10-CM | POA: Diagnosis not present

## 2017-09-02 DIAGNOSIS — M25612 Stiffness of left shoulder, not elsewhere classified: Secondary | ICD-10-CM | POA: Diagnosis not present

## 2017-09-02 DIAGNOSIS — M25611 Stiffness of right shoulder, not elsewhere classified: Secondary | ICD-10-CM | POA: Diagnosis not present

## 2017-09-02 DIAGNOSIS — M62838 Other muscle spasm: Secondary | ICD-10-CM | POA: Diagnosis not present

## 2017-09-02 NOTE — Therapy (Signed)
[facility] [address] [name], [place], [address] Phone: [phone]   Fax:  [phone]  Physical Therapy Treatment  Patient Details  Name: Kendra [name] MRN: [id number] Date of Birth: 1936/06/06 Referring Provider: Dr. Lawerance Cruel   Encounter Date: 09/02/2017  PT End of Session - 09/02/17 2208    Visit Number  5    Date for PT Re-Evaluation  10/12/17    Authorization Type  Medicare KX at visit 15    PT Start Time  1102    PT Stop Time  1144    PT Time Calculation (min)  42 min    Activity Tolerance  Patient tolerated treatment well       Past Medical History:  Diagnosis Date  . Asthma   . CAD (coronary artery disease)    last cath 08/2006  . Carotid artery disease (HCC)    moderate left ICA stenosis by 2.6 ultrasound  . DJD (degenerative joint disease), lumbar   . Hearing loss   . HTN (hypertension)   . Hyperlipidemia   . Vision loss of right eye     Past Surgical History:  Procedure Laterality Date  . ABDOMINAL HYSTERECTOMY  1975  . APPENDECTOMY    . CORONARY ANGIOPLASTY WITH STENT PLACEMENT  08/2006   taxus stent x2 to LAD, 2.5x32mm and 2.5x21mm; done at [facility]  . RENAL ANGIOGRAM  [facility] in [place]  . TONSILLECTOMY  1994    There were no vitals filed for this visit.  Subjective Assessment - 09/02/17 1103    Subjective  The tape helped for a day and a half but I woke up and the top of my shoulder was a little red and itchy.  Left shoulder 5/10.      Currently in Pain?  Yes    Pain Score  5     Pain Location  Shoulder    Pain Orientation  Right;Left    Pain Type  Chronic pain         [facility] PT Assessment - 09/02/17 0001      AROM   Right Shoulder Flexion  146 Degrees    Right Shoulder ABduction  145 Degrees    Right Shoulder Internal Rotation  -- T8    Right Shoulder External Rotation  54 Degrees    Left Shoulder Flexion  135 Degrees    Left  Shoulder ABduction  132 Degrees    Left Shoulder Internal Rotation  -- T8    Left Shoulder External Rotation  41 Degrees            No data recorded       [facility] Adult PT Treatment/Exercise - 09/02/17 0001      Therapeutic Activites    Therapeutic Activities  ADL's    ADL's  reaching, pulling      Shoulder Exercises: Supine   Protraction  Strengthening;Right;Left;10 reps    Other Supine Exercises  bent arm elevation, straighten elbow, flexion and resisted lowering 8x right/left 1# weight  use cuff weight for ease of wrist pain      Shoulder Exercises: Sidelying   External Rotation  AROM;Left;10 reps    Other Sidelying Exercises  left 12/6:00 10x       Shoulder Exercises: Standing   Other Standing Exercises  wall push ups 10x      Shoulder Exercises: ROM/Strengthening   Other ROM/Strengthening Exercises  UE Ranger L5 15x right/left  Manual Therapy   Joint Mobilization  Glenohumeral joint distraction grade 2 right/left 5x    Soft tissue mobilization  deltoids, triceps, persiscapular muscles bil     Passive ROM  flexion 10x bil    Kinesiotex  Facilitate Muscle      Kinesiotix   Facilitate Muscle   1 strip each shoulder from anterior shoulder to posterior scapular spine               PT Short Term Goals - 08/27/17 1130      PT SHORT TERM GOAL #1   Title  pt will be independent with initial HEP for bilateral shoulder ROM    Time  4    Period  Weeks    Status  Achieved      PT SHORT TERM GOAL #2   Title  pt reports 25% less bil shoulder pain with daily activities including reaching, dressing    Time  4    Period  Weeks    Status  On-going      PT SHORT TERM GOAL #3   Title  The patient will have bil shoulder flexion to 140 degrees needed for reaching above head level     Time  4    Period  Weeks    Status  On-going      PT SHORT TERM GOAL #4   Title  Patient will have glenohumeral and scapular strength to lift a 1# object to an eye level  shelf with minimal discomfort    Time  4    Period  Weeks    Status  On-going        PT Long Term Goals - 08/17/17 1928      PT LONG TERM GOAL #1   Title  The patient will be independent with safe self progression of HEP    Time  8    Period  Weeks    Status  New    Target Date  10/12/17      PT LONG TERM GOAL #2   Title  pain at least 50% improved in bilateral shoulders with dressing and reaching overhead    Time  8    Period  Weeks    Status  New      PT LONG TERM GOAL #3   Title  Bilateral shoulder strength grossly 4/5 needed for lifting a pot or dish with greater ease     Time  8    Period  Weeks    Status  New      PT LONG TERM GOAL #4   Title  Patient will have improved shoulder flexion/scaption to 150 degrees and external rotation to 40 degrees needed for reaching and dressing    Time  8    Period  Weeks    Status  New      PT LONG TERM GOAL #5   Title  FOTO functional outcome score improved from 48% limitation to 36% indicating improved function with less pain    Time  8    Period  Weeks    Status  New            Plan - 09/02/17 2209    Clinical Impression Statement  The patient has much improved bilateral shoulder ROM in all planes.   She is able to participate in low level glenohumeral and scapular strengthening exercises with some modification (cuff weight instead of dumbbell secondary to wrist arthritis).  Good initial response to kinesiotape.  On track to meet STGs.     Rehab Potential  Good    Clinical Impairments Affecting Rehab Potential  none    PT Frequency  2x / week    PT Duration  8 weeks    PT Treatment/Interventions  ADLs/Self Care Home Management;Iontophoresis 4mg /ml Dexamethasone;Dry needling;Moist Heat;Ultrasound;Electrical Stimulation;Therapeutic exercise;Therapeutic activities;Patient/family education;Manual techniques;Taping;Neuromuscular re-education    PT Next Visit Plan  ionto if cert signed;  check response to KT bil shoulder;   wall push ups, ball on wall circles;   glenohumeral and scapular strengthening;   bil elbow extension ROM;  UE Ranger; progress HEP;        Patient will benefit from skilled therapeutic intervention in order to improve the following deficits and impairments:  Pain, Postural dysfunction, Decreased range of motion, Decreased strength, Impaired UE functional use  Visit Diagnosis: Stiffness of left shoulder, not elsewhere classified  Acute pain of right shoulder  Acute pain of left shoulder  Stiffness of right shoulder, not elsewhere classified  Muscle weakness (generalized)     Problem List Patient Active Problem List   Diagnosis Date Noted  . Carotid artery disease ([place]) 09/19/2014  . Disturbance of skin sensation 11/17/2013  . Coronary artery disease 03/31/2013  . Essential hypertension 03/31/2013  . Hyperlipidemia 03/31/2013   Ruben Im, PT 09/02/17 10:14 PM Phone: [phone] Fax: [phone]  Alvera Singh 09/02/2017, 10:14 PM  [facility] [address] [place], [place], [address] Phone: [phone]   Fax:  [phone]  Name: Ailana Cuadrado MRN: [id number] Date of Birth: 19-Jan-1936

## 2017-09-07 ENCOUNTER — Ambulatory Visit: Payer: Medicare Other | Attending: Family Medicine | Admitting: Physical Therapy

## 2017-09-07 ENCOUNTER — Encounter: Payer: Self-pay | Admitting: Physical Therapy

## 2017-09-07 DIAGNOSIS — M25612 Stiffness of left shoulder, not elsewhere classified: Secondary | ICD-10-CM | POA: Diagnosis not present

## 2017-09-07 DIAGNOSIS — M25512 Pain in left shoulder: Secondary | ICD-10-CM | POA: Diagnosis not present

## 2017-09-07 DIAGNOSIS — M6281 Muscle weakness (generalized): Secondary | ICD-10-CM | POA: Insufficient documentation

## 2017-09-07 DIAGNOSIS — M25611 Stiffness of right shoulder, not elsewhere classified: Secondary | ICD-10-CM | POA: Diagnosis not present

## 2017-09-07 DIAGNOSIS — M25511 Pain in right shoulder: Secondary | ICD-10-CM

## 2017-09-07 NOTE — Therapy (Signed)
[facility] [address], [place], [place], [address] Phone: [phone]   Fax:  [phone]  Physical Therapy Treatment  Patient Details  Name: Kendra [name] MRN: [id number] Date of Birth: [birth year] Referring Provider: Dr. Lawerance Cruel   Encounter Date: 09/07/2017  PT End of Session - 09/07/17 1521    Visit Number  6    Date for PT Re-Evaluation  10/12/17    Authorization Type  Medicare KX at visit 15    PT Start Time  1451    PT Stop Time  1529    PT Time Calculation (min)  38 min    Activity Tolerance  Patient tolerated treatment well       Past Medical History:  Diagnosis Date  . Asthma   . CAD (coronary artery disease)    last cath 08/2006  . Carotid artery disease (HCC)    moderate left ICA stenosis by 2.6 ultrasound  . DJD (degenerative joint disease), lumbar   . Hearing loss   . HTN (hypertension)   . Hyperlipidemia   . Vision loss of right eye     Past Surgical History:  Procedure Laterality Date  . ABDOMINAL HYSTERECTOMY  1975  . APPENDECTOMY    . CORONARY ANGIOPLASTY WITH STENT PLACEMENT  08/2006   taxus stent x2 to LAD, 2.5x28mm and 2.5x72mm; done at [facility]  . RENAL ANGIOGRAM  [facility] in [place]  . TONSILLECTOMY  1994    There were no vitals filed for this visit.  Subjective Assessment - 09/07/17 1452    Subjective  Pretty good.  I think the damp weather aggravates the issue.  The tape bothered my skin so I had to take it off.      Currently in Pain?  Yes    Pain Score  5     Pain Location  Shoulder    Pain Orientation  Right;Lateral;Upper;Left    Pain Type  Chronic pain                       [facility] Adult PT Treatment/Exercise - 09/07/17 0001      Therapeutic Activites    Therapeutic Activities  ADL's    ADL's  reaching, pulling      Shoulder Exercises: Supine   Protraction  Strengthening;Right;Left;10 reps;Weights    Protraction Weight (lbs)  1 wrist cuff weights    Other Supine Exercises  bent arm elevation, straighten elbow, flexion and resisted lowering 8x right/left 1# weight  use cuff weight for ease of wrist pain    Other Supine Exercises  1# circles at 90 degrees flexion 10x      Shoulder Exercises: Seated   Other Seated Exercises  1# weights with ball "steering wheel"        Shoulder Exercises: Standing   Flexion  AAROM;Both;10 reps red ball roll up and down wall    Extension  Strengthening;Right;Left;10 reps;Theraband    Theraband Level (Shoulder Extension)  Level 2 (Red)    Row  Strengthening;Right;Left;10 reps;Theraband    Theraband Level (Shoulder Row)  Level 2 (Red)    Other Standing Exercises  wall push ups 5x double, 5x single     Other Standing Exercises  ball on wall circles 10x right/left and 10x  counter clockwise      Shoulder Exercises: ROM/Strengthening   Other ROM/Strengthening Exercises  UE Ranger L10 15x right/left  PT Short Term Goals - 08/27/17 1130      PT SHORT TERM GOAL #1   Title  pt will be independent with initial HEP for bilateral shoulder ROM    Time  4    Period  Weeks    Status  Achieved      PT SHORT TERM GOAL #2   Title  pt reports 25% less bil shoulder pain with daily activities including reaching, dressing    Time  4    Period  Weeks    Status  On-going      PT SHORT TERM GOAL #3   Title  The patient will have bil shoulder flexion to 140 degrees needed for reaching above head level     Time  4    Period  Weeks    Status  On-going      PT SHORT TERM GOAL #4   Title  Patient will have glenohumeral and scapular strength to lift a 1# object to an eye level shelf with minimal discomfort    Time  4    Period  Weeks    Status  On-going        PT Long Term Goals - 08/17/17 1928      PT LONG TERM GOAL #1   Title  The patient will be independent with safe self progression of HEP    Time  8    Period  Weeks    Status  New     Target Date  10/12/17      PT LONG TERM GOAL #2   Title  pain at least 50% improved in bilateral shoulders with dressing and reaching overhead    Time  8    Period  Weeks    Status  New      PT LONG TERM GOAL #3   Title  Bilateral shoulder strength grossly 4/5 needed for lifting a pot or dish with greater ease     Time  8    Period  Weeks    Status  New      PT LONG TERM GOAL #4   Title  Patient will have improved shoulder flexion/scaption to 150 degrees and external rotation to 40 degrees needed for reaching and dressing    Time  8    Period  Weeks    Status  New      PT LONG TERM GOAL #5   Title  FOTO functional outcome score improved from 48% limitation to 36% indicating improved function with less pain    Time  8    Period  Weeks    Status  New            Plan - 09/07/17 1523    Clinical Impression Statement  The patient needs modifications to exercises secondary to wrist pain ( likes cuff weights fit snugly)  and modifications for elbow pain.  She is able to participate in a progression of resistive exercises for shoulder with minimal increase in pain overall.  Verbal and tactile cues to decrease compensatory shoulder shrug.  On track to meet STGs.      Rehab Potential  Good    Clinical Impairments Affecting Rehab Potential  none    PT Frequency  2x / week    PT Duration  8 weeks    PT Treatment/Interventions  ADLs/Self Care Home Management;Iontophoresis 4mg /ml Dexamethasone;Dry needling;Moist Heat;Ultrasound;Electrical Stimulation;Therapeutic exercise;Therapeutic activities;Patient/family education;Manual techniques;Taping;Neuromuscular re-education    PT Next Visit Plan  ionto if  cert signed;   recheck shoulder and elbow ROM and other STGs next visit;  wall push ups, ball on wall circles;   glenohumeral and scapular strengthening;   bil elbow extension ROM;  UE Ranger; progress HEP       Patient will benefit from skilled therapeutic intervention in order to  improve the following deficits and impairments:  Pain, Postural dysfunction, Decreased range of motion, Decreased strength, Impaired UE functional use  Visit Diagnosis: Stiffness of left shoulder, not elsewhere classified  Acute pain of right shoulder  Acute pain of left shoulder  Stiffness of right shoulder, not elsewhere classified  Muscle weakness (generalized)     Problem List Patient Active Problem List   Diagnosis Date Noted  . Carotid artery disease ([place]) 09/19/2014  . Disturbance of skin sensation 11/17/2013  . Coronary artery disease 03/31/2013  . Essential hypertension 03/31/2013  . Hyperlipidemia 03/31/2013   Ruben Im, PT 09/07/17 5:42 PM Phone: [phone] Fax: [phone]  Alvera Singh 09/07/2017, 5:42 PM  [facility] [address] [place], [place], [address] Phone: [phone]   Fax:  [phone]  Name: Kendra [name] MRN: [id number] Date of Birth: [birth year]

## 2017-09-10 ENCOUNTER — Ambulatory Visit: Payer: Medicare Other | Admitting: Physical Therapy

## 2017-09-10 ENCOUNTER — Encounter: Payer: Self-pay | Admitting: Physical Therapy

## 2017-09-10 DIAGNOSIS — M25612 Stiffness of left shoulder, not elsewhere classified: Secondary | ICD-10-CM | POA: Diagnosis not present

## 2017-09-10 DIAGNOSIS — M25611 Stiffness of right shoulder, not elsewhere classified: Secondary | ICD-10-CM

## 2017-09-10 DIAGNOSIS — M25511 Pain in right shoulder: Secondary | ICD-10-CM | POA: Diagnosis not present

## 2017-09-10 DIAGNOSIS — M25512 Pain in left shoulder: Secondary | ICD-10-CM

## 2017-09-10 DIAGNOSIS — M6281 Muscle weakness (generalized): Secondary | ICD-10-CM | POA: Diagnosis not present

## 2017-09-10 NOTE — Therapy (Signed)
[facility] [address] [place], [place], [address] Phone: [phone]   Fax:  [phone]  Physical Therapy Treatment  Patient Details  Name: Kendra [name] MRN: [id number] Date of Birth: Sep 16, 1935 Referring Provider: Dr. Lawerance Cruel   Encounter Date: 09/10/2017  PT End of Session - 09/10/17 1130    Visit Number  7    Date for PT Re-Evaluation  10/12/17    Authorization Type  Medicare KX at visit 15    PT Start Time  1103    PT Stop Time  1145    PT Time Calculation (min)  42 min    Activity Tolerance  Patient tolerated treatment well       Past Medical History:  Diagnosis Date  . Asthma   . CAD (coronary artery disease)    last cath 08/2006  . Carotid artery disease (HCC)    moderate left ICA stenosis by 2.6 ultrasound  . DJD (degenerative joint disease), lumbar   . Hearing loss   . HTN (hypertension)   . Hyperlipidemia   . Vision loss of right eye     Past Surgical History:  Procedure Laterality Date  . ABDOMINAL HYSTERECTOMY  1975  . APPENDECTOMY    . CORONARY ANGIOPLASTY WITH STENT PLACEMENT  08/2006   taxus stent x2 to LAD, 2.5x59mm and 2.5x78mm; done at [facility]  . RENAL ANGIOGRAM  [facility] in [place]  . TONSILLECTOMY  1994    There were no vitals filed for this visit.  Subjective Assessment - 09/10/17 1104    Subjective  Shoulders feeling OK.  There is some improvement but hard to guage.      Currently in Pain?  Yes    Pain Score  5  4/10 on right    Pain Location  Shoulder    Pain Orientation  Right;Left    Pain Type  Chronic pain         [facility] PT Assessment - 09/10/17 0001      AROM   AROM Assessment Site  Elbow    Right Shoulder Flexion  153 Degrees    Right Shoulder ABduction  157 Degrees    Right Shoulder Internal Rotation  -- T8    Right Shoulder External Rotation  62 Degrees    Left Shoulder Flexion  140 Degrees    Left Shoulder ABduction   143 Degrees    Left Shoulder Internal Rotation  -- T8    Left Shoulder External Rotation  48 Degrees    Right Elbow Extension  20    Left Elbow Extension  12                   [facility] Adult PT Treatment/Exercise - 09/10/17 0001      Therapeutic Activites    Therapeutic Activities  ADL's    ADL's  reaching, pulling      Shoulder Exercises: Supine   Flexion  Strengthening;Right;Left;10 reps;Weights    Shoulder Flexion Weight (lbs)  1#    Other Supine Exercises  1# 12/6 10x      Shoulder Exercises: Sidelying   External Rotation  Strengthening;Right;Left;10 reps;Weights    External Rotation Weight (lbs)  1    Other Sidelying Exercises  left 12/6:00 10x 1#      Shoulder Exercises: Standing   Extension  Strengthening;Right;Left;20 reps;Theraband    Theraband Level (Shoulder Extension)  Level 2 (Red)    Other Standing Exercises  1#  wall slides 10x right/left      Shoulder Exercises: ROM/Strengthening   Other ROM/Strengthening Exercises  UE Ranger L16 15x right/left               PT Short Term Goals - 08/27/17 1130      PT SHORT TERM GOAL #1   Title  pt will be independent with initial HEP for bilateral shoulder ROM    Time  4    Period  Weeks    Status  Achieved      PT SHORT TERM GOAL #2   Title  pt reports 25% less bil shoulder pain with daily activities including reaching, dressing    Time  4    Period  Weeks    Status  On-going      PT SHORT TERM GOAL #3   Title  The patient will have bil shoulder flexion to 140 degrees needed for reaching above head level     Time  4    Period  Weeks    Status  On-going      PT SHORT TERM GOAL #4   Title  Patient will have glenohumeral and scapular strength to lift a 1# object to an eye level shelf with minimal discomfort    Time  4    Period  Weeks    Status  On-going        PT Long Term Goals - 08/17/17 1928      PT LONG TERM GOAL #1   Title  The patient will be independent with safe self progression  of HEP    Time  8    Period  Weeks    Status  New    Target Date  10/12/17      PT LONG TERM GOAL #2   Title  pain at least 50% improved in bilateral shoulders with dressing and reaching overhead    Time  8    Period  Weeks    Status  New      PT LONG TERM GOAL #3   Title  Bilateral shoulder strength grossly 4/5 needed for lifting a pot or dish with greater ease     Time  8    Period  Weeks    Status  New      PT LONG TERM GOAL #4   Title  Patient will have improved shoulder flexion/scaption to 150 degrees and external rotation to 40 degrees needed for reaching and dressing    Time  8    Period  Weeks    Status  New      PT LONG TERM GOAL #5   Title  FOTO functional outcome score improved from 48% limitation to 36% indicating improved function with less pain    Time  8    Period  Weeks    Status  New            Plan - 09/10/17 1130    Clinical Impression Statement  The patient is making steady gains in bilateral shoulder ROM in flexion, abduction and external rotation.  Her left shoulder pain remains moderate and will plan to initiate iontophoresis next vist.  She is able to progress with resistive exercises and a progression from bent arm raises to straight arm raises.  Verbal and tactile cues to avoid compensation using wrist movements during shoulder exercises.  Therapist also monitoring for pain and modifying as needed.      Rehab Potential  Good  Clinical Impairments Affecting Rehab Potential  none    PT Frequency  2x / week    PT Duration  8 weeks    PT Treatment/Interventions  ADLs/Self Care Home Management;Iontophoresis 4mg /ml Dexamethasone;Dry needling;Moist Heat;Ultrasound;Electrical Stimulation;Therapeutic exercise;Therapeutic activities;Patient/family education;Manual techniques;Taping;Neuromuscular re-education    PT Next Visit Plan  iontophoresis; check remaining STGs next visit;    wall push ups, ball on wall circles;  band diagonal extension;    glenohumeral and scapular strengthening;   bil elbow extension ROM;  UE Ranger; progress HEP       Patient will benefit from skilled therapeutic intervention in order to improve the following deficits and impairments:  Pain, Postural dysfunction, Decreased range of motion, Decreased strength, Impaired UE functional use  Visit Diagnosis: Stiffness of left shoulder, not elsewhere classified  Acute pain of right shoulder  Acute pain of left shoulder  Stiffness of right shoulder, not elsewhere classified  Muscle weakness (generalized)     Problem List Patient Active Problem List   Diagnosis Date Noted  . Carotid artery disease ([place]) 09/19/2014  . Disturbance of skin sensation 11/17/2013  . Coronary artery disease 03/31/2013  . Essential hypertension 03/31/2013  . Hyperlipidemia 03/31/2013   Ruben Im, PT 09/10/17 11:54 AM Phone: [phone] Fax: [phone]  Alvera Singh 09/10/2017, 11:54 AM  [facility] [address] [place], [place], [address] Phone: [phone]   Fax:  [phone]  Name: Kendra [name] MRN: [id number] Date of Birth: 1936-05-28

## 2017-09-14 ENCOUNTER — Ambulatory Visit: Payer: Medicare Other | Admitting: Physical Therapy

## 2017-09-14 ENCOUNTER — Encounter: Payer: Self-pay | Admitting: Physical Therapy

## 2017-09-14 DIAGNOSIS — M25612 Stiffness of left shoulder, not elsewhere classified: Secondary | ICD-10-CM | POA: Diagnosis not present

## 2017-09-14 DIAGNOSIS — M25611 Stiffness of right shoulder, not elsewhere classified: Secondary | ICD-10-CM | POA: Diagnosis not present

## 2017-09-14 DIAGNOSIS — M25512 Pain in left shoulder: Secondary | ICD-10-CM | POA: Diagnosis not present

## 2017-09-14 DIAGNOSIS — M25511 Pain in right shoulder: Secondary | ICD-10-CM | POA: Diagnosis not present

## 2017-09-14 DIAGNOSIS — M6281 Muscle weakness (generalized): Secondary | ICD-10-CM | POA: Diagnosis not present

## 2017-09-14 NOTE — Therapy (Signed)
[facility] [address], [place], [place], [address] Phone: [phone]   Fax:  [phone]  Physical Therapy Treatment  Patient Details  Name: Kendra [name] MRN: [id number] Date of Birth: 1935/07/01 Referring Provider: Dr. Lawerance Cruel   Encounter Date: 09/14/2017  PT End of Session - 09/14/17 0932    Visit Number  8    Date for PT Re-Evaluation  10/12/17    Authorization Type  Medicare KX at visit 15    PT Start Time  0932    PT Stop Time  1014    PT Time Calculation (min)  42 min    Activity Tolerance  Patient tolerated treatment well       Past Medical History:  Diagnosis Date  . Asthma   . CAD (coronary artery disease)    last cath 08/2006  . Carotid artery disease (HCC)    moderate left ICA stenosis by 2.6 ultrasound  . DJD (degenerative joint disease), lumbar   . Hearing loss   . HTN (hypertension)   . Hyperlipidemia   . Vision loss of right eye     Past Surgical History:  Procedure Laterality Date  . ABDOMINAL HYSTERECTOMY  1975  . APPENDECTOMY    . CORONARY ANGIOPLASTY WITH STENT PLACEMENT  08/2006   taxus stent x2 to LAD, 2.5x70m and 2.[facility]x827[facility]  [facility]    There were no vitals filed for this visit.  Subjective Assessment - 09/14/17 0934    Subjective  Didn't sleep well.  Unsure why.  No pain this morning but stiff and feels a "pull" with movement.  I had Tai Chi yesterday.      Pertinent History  Cardiac stents 2008;  hard of hearing    Currently in Pain?  No/denies    Pain Score  0-No pain    Pain Location  Shoulder    Pain Orientation  Right;Left    Pain Type  Chronic pain                       [facility] Adult PT Treatment/Exercise - 09/14/17 0001      Therapeutic Activites    Therapeutic Activities  ADL's    ADL's  reaching, pulling      Shoulder Exercises: Supine    Flexion  Strengthening;Right;Left;10 reps;Weights head of bed elevated to increase difficulty     Shoulder Flexion Weight (lbs)  1#    Other Supine Exercises  1# 12/6 and 9/3 10x each way and each arm  head of bead elevated to increase difficulty      Shoulder Exercises: Seated   Flexion  Strengthening;Right;Left;5 reps    Other Seated Exercises  scaption 1# 5x bil      Shoulder Exercises: Standing   Extension  Strengthening;Right;Left;10 reps;Theraband    Theraband Level (Shoulder Extension)  Level 2 (Red)    Diagonals  Strengthening;Right;Left;10 reps;Theraband D1/2 extension    Theraband Level (Shoulder Diagonals)  Level 2 (Red)    Other Standing Exercises  1# flexion and scaption 5x each    Other Standing Exercises  1# wall slides 10x right/left      Shoulder Exercises: Therapy Ball   Flexion  Both;10 reps red ball roll up wall    Other Therapy Ball Exercises  small ball circles on wall 10x clockwise/counterclockwise      Shoulder Exercises:  ROM/Strengthening   Other ROM/Strengthening Exercises  UE Ranger L18 15x right/left      Iontophoresis   Type of Iontophoresis  Dexamethasone    Location  left posterior shoulder    Dose  54m/ml     Time  4 hour patch             PT Education - 09/14/17 1845    Education provided  Yes    Education Details  iontophoresis information    Person(s) Educated  Patient    Methods  Explanation;Handout    Comprehension  Verbalized understanding       PT Short Term Goals - 09/14/17 1904      PT SHORT TERM GOAL #1   Title  pt will be independent with initial HEP for bilateral shoulder ROM    Status  Achieved      PT SHORT TERM GOAL #2   Title  pt reports 25% less bil shoulder pain with daily activities including reaching, dressing    Time  4    Period  Weeks    Status  On-going      PT SHORT TERM GOAL #3   Title  The patient will have bil shoulder flexion to 140 degrees needed for reaching above head level     Status   Achieved      PT SHORT TERM GOAL #4   Title  Patient will have glenohumeral and scapular strength to lift a 1# object to an eye level shelf with minimal discomfort    Status  Achieved        PT Long Term Goals - 08/17/17 1928      PT LONG TERM GOAL #1   Title  The patient will be independent with safe self progression of HEP    Time  8    Period  Weeks    Status  New    Target Date  10/12/17      PT LONG TERM GOAL #2   Title  pain at least 50% improved in bilateral shoulders with dressing and reaching overhead    Time  8    Period  Weeks    Status  New      PT LONG TERM GOAL #3   Title  Bilateral shoulder strength grossly 4/5 needed for lifting a pot or dish with greater ease     Time  8    Period  Weeks    Status  New      PT LONG TERM GOAL #4   Title  Patient will have improved shoulder flexion/scaption to 150 degrees and external rotation to 40 degrees needed for reaching and dressing    Time  8    Period  Weeks    Status  New      PT LONG TERM GOAL #5   Title  FOTO functional outcome score improved from 48% limitation to 36% indicating improved function with less pain    Time  8    Period  Weeks    Status  New            Plan - 09/14/17 1847    Clinical Impression Statement  The patient continues to progress with bilateral shoulder strengthening exercises.  Therapist closely monitoring response and modifying for wrist pain.  Verbal cues to avoid compensatory shoulder hike.  Initiated iontophoresis for pain control.  Majority of STGs met.      Rehab Potential  Good    Clinical Impairments Affecting  Rehab Potential  none    PT Frequency  2x / week    PT Duration  8 weeks    PT Treatment/Interventions  ADLs/Self Care Home Management;Iontophoresis 90m/ml Dexamethasone;Dry needling;Moist Heat;Ultrasound;Electrical Stimulation;Therapeutic exercise;Therapeutic activities;Patient/family education;Manual techniques;Taping;Neuromuscular re-education    PT Next Visit  Plan  assess response to iontophoresis #1; check remaining STGs next visit;    wall push ups, ball on wall circles;  band diagonal extension;   glenohumeral and scapular strengthening;   bil elbow extension ROM;  UE Ranger; progress HEP       Patient will benefit from skilled therapeutic intervention in order to improve the following deficits and impairments:  Pain, Postural dysfunction, Decreased range of motion, Decreased strength, Impaired UE functional use  Visit Diagnosis: Stiffness of left shoulder, not elsewhere classified  Acute pain of right shoulder  Acute pain of left shoulder  Stiffness of right shoulder, not elsewhere classified  Muscle weakness (generalized)     Problem List Patient Active Problem List   Diagnosis Date Noted  . [place] artery disease [place] 09/19/2014  . Disturbance of skin sensation 11/17/2013  . Coronary artery disease 03/31/2013  . Essential hypertension 03/31/2013  . Hyperlipidemia 03/31/2013   SRuben Im PT 09/14/17 7:07 PM Phone: [phone]: [phone] SAlvera Singh4/02/2018, 7:06 PM  [facility] [address] [place] [address]Phone: [phone]  Fax:  [phone] Name: BElleni MozingoMRN: [id number] of Birth: 902-08-1935

## 2017-09-14 NOTE — Patient Instructions (Signed)

## 2017-09-16 ENCOUNTER — Encounter: Payer: Self-pay | Admitting: Physical Therapy

## 2017-09-16 ENCOUNTER — Ambulatory Visit: Payer: Medicare Other | Admitting: Physical Therapy

## 2017-09-16 DIAGNOSIS — M25611 Stiffness of right shoulder, not elsewhere classified: Secondary | ICD-10-CM

## 2017-09-16 DIAGNOSIS — M6281 Muscle weakness (generalized): Secondary | ICD-10-CM

## 2017-09-16 DIAGNOSIS — M25612 Stiffness of left shoulder, not elsewhere classified: Secondary | ICD-10-CM | POA: Diagnosis not present

## 2017-09-16 DIAGNOSIS — M25511 Pain in right shoulder: Secondary | ICD-10-CM

## 2017-09-16 DIAGNOSIS — M25512 Pain in left shoulder: Secondary | ICD-10-CM

## 2017-09-16 NOTE — Patient Instructions (Signed)
   SHOULDER: Abduction (Isometric)  Use wall as resistance. Press arm against pillow. Keep elbow straight. Hold _5__ seconds. _5__ reps per set, __1_ sets per day, __7_ days per week  Extension (Isometric)  Place left bent elbow and back of arm against wall. Press elbow against wall. Hold __5__ seconds. Repeat _5___ times. Do _1___ sessions per day.   External Rotation (Isometric)  Place back of left fist against door frame, with elbow bent. Press fist against door frame. Hold __5__ seconds. Repeat ___5_ times. Do __1__ sessions per day.  Copyright  VHI. All rights reserved.      Ruben Im PT [facility] [address] [place],  [address] Phone # [phone] Fax [phone]

## 2017-09-16 NOTE — Therapy (Signed)
[facility] [address], [place], [place], [address] Phone: [phone]   Fax:  [phone]  Physical Therapy Treatment  Patient Details  Name: Kendra [name] MRN: [id number] Date of Birth: April 18, 1936 Referring Provider: Dr. Lawerance Cruel   Encounter Date: 09/16/2017  PT End of Session - 09/16/17 1140    Visit Number  9    Date for PT Re-Evaluation  10/12/17    Authorization Type  Medicare KX at visit 15    PT Start Time  1102    PT Stop Time  1141    PT Time Calculation (min)  39 min       Past Medical History:  Diagnosis Date  . Asthma   . CAD (coronary artery disease)    last cath 08/2006  . Carotid artery disease (HCC)    moderate left ICA stenosis by 2.6 ultrasound  . DJD (degenerative joint disease), lumbar   . Hearing loss   . HTN (hypertension)   . Hyperlipidemia   . Vision loss of right eye     Past Surgical History:  Procedure Laterality Date  . ABDOMINAL HYSTERECTOMY  1975  . APPENDECTOMY    . CORONARY ANGIOPLASTY WITH STENT PLACEMENT  08/2006   taxus stent x2 to LAD, 2.5x41m and 2[facility]889[facility]  1994    There were no vitals filed for this visit.  Subjective Assessment - 09/16/17 1103    Subjective  "I don't think the (ionto) patch did anything."  I was really achey yesterday.  I can feel some improvement in reaching overhead.      Currently in Pain?  Yes    Pain Score  6     Pain Location  Shoulder    Pain Orientation  Left;Right left > right    Aggravating Factors   "I can't say";  when I reach up I full a pull and some pain         [facility] PT Assessment - 09/16/17 0001      AROM   Right Shoulder Flexion  154 Degrees    Right Shoulder ABduction  163 Degrees    Right Shoulder External Rotation  66 Degrees    Left Shoulder Flexion  146 Degrees    Left Shoulder ABduction  155 Degrees     Left Shoulder External Rotation  40 Degrees                   [facility] Adult PT Treatment/Exercise - 09/16/17 0001      Therapeutic Activites    Therapeutic Activities  ADL's    ADL's  reaching, pulling      Shoulder Exercises: Supine   Flexion  Strengthening;Right;Left;Both;5 reps;Weights head of bed elevated to increase difficulty     Shoulder Flexion Weight (lbs)  1#    Other Supine Exercises  1# 12/6 and 9/3 5x each way and each arm  head of bead elevated to increase difficulty      Shoulder Exercises: Standing   Extension  Strengthening;Right;Left;15 reps;Theraband    Theraband Level (Shoulder Extension)  Level 2 (Red)    Row  Strengthening;Both;15 reps    Theraband Level (Shoulder Row)  Level 2 (Red)    Other Standing Exercises  shoulder extension isometric against wall 10x 5 sec holds    Other Standing Exercises  wall slides 5x right/left  Shoulder Exercises: ROM/Strengthening   Other ROM/Strengthening Exercises  UE Ranger L20 10x right and left      Shoulder Exercises: Isometric Strengthening   External Rotation  5X5"    ABduction  5X5"             PT Education - 09/16/17 1137    Education provided  Yes    Education Details  shoulder abduction, extension and external rotation isometrics at the wall    Person(s) Educated  Patient    Methods  Explanation;Demonstration;Handout    Comprehension  Returned demonstration;Verbalized understanding       PT Short Term Goals - 09/16/17 1107      PT SHORT TERM GOAL #1   Title  pt will be independent with initial HEP for bilateral shoulder ROM    Status  Achieved      PT SHORT TERM GOAL #2   Title  pt reports 25% less bil shoulder pain with daily activities including reaching, dressing    Baseline  50%    Status  Achieved      PT SHORT TERM GOAL #3   Title  The patient will have bil shoulder flexion to 140 degrees needed for reaching above head level     Status  Achieved      PT SHORT TERM  GOAL #4   Title  Patient will have glenohumeral and scapular strength to lift a 1# object to an eye level shelf with minimal discomfort    Status  Achieved        PT Long Term Goals - 08/17/17 1928      PT LONG TERM GOAL #1   Title  The patient will be independent with safe self progression of HEP    Time  8    Period  Weeks    Status  New    Target Date  10/12/17      PT LONG TERM GOAL #2   Title  pain at least 50% improved in bilateral shoulders with dressing and reaching overhead    Time  8    Period  Weeks    Status  New      PT LONG TERM GOAL #3   Title  Bilateral shoulder strength grossly 4/5 needed for lifting a pot or dish with greater ease     Time  8    Period  Weeks    Status  New      PT LONG TERM GOAL #4   Title  Patient will have improved shoulder flexion/scaption to 150 degrees and external rotation to 40 degrees needed for reaching and dressing    Time  8    Period  Weeks    Status  New      PT LONG TERM GOAL #5   Title  FOTO functional outcome score improved from 48% limitation to 36% indicating improved function with less pain    Time  8    Period  Weeks    Status  New            Plan - 09/16/17 1347    Clinical Impression Statement  The patient reports she is 50% better overall.  Her shoulder ROM has improved bilaterally.  Her main limiting factor with shoulder exercises is right wrist pain.    Therapist modifying exercises multiple times because of wrist pain.  Suggested she wear her wrist brace for exercises.  Decreased exercise intenisty today in response to achiness after last visit.  She  requested not to do ionto b/c she did not have immediate results.   We discussed that further applications may have better benefit but she declines at this time.    All STGs met.  Discussed decreasing frequency to 1x/week since she is progressing well.      Rehab Potential  Good    Clinical Impairments Affecting Rehab Potential  tape sensitivity    PT  Frequency  2x / week    PT Duration  8 weeks    PT Next Visit Plan  10th visit progress note;  discontinue iontophoresis;  shoulder isometrics;   wall push ups, ball on wall circles;  keep neutral wrist with ex;    glenohumeral and scapular strengthening;   bil elbow extension ROM;  UE Ranger; progress HEP    Recommended Other Services  cert signed       Patient will benefit from skilled therapeutic intervention in order to improve the following deficits and impairments:  Pain, Postural dysfunction, Decreased range of motion, Decreased strength, Impaired UE functional use  Visit Diagnosis: Stiffness of left shoulder, not elsewhere classified  Acute pain of right shoulder  Acute pain of left shoulder  Stiffness of right shoulder, not elsewhere classified  Muscle weakness (generalized)     Problem List Patient Active Problem List   Diagnosis Date Noted  . Carotid artery disease ([place]) 09/19/2014  . Disturbance of skin sensation 11/17/2013  . Coronary artery disease 03/31/2013  . Essential hypertension 03/31/2013  . Hyperlipidemia 03/31/2013   Ruben Im, PT 09/16/17 1:57 PM Phone: [phone] Fax: [phone]  Alvera Singh 09/16/2017, 1:57 PM  [facility] [address] [place], [place], [address] Phone: [phone]   Fax:  [phone]  Name: Kendra [name] MRN: [id number] Date of Birth: 1935-06-18

## 2017-09-20 DIAGNOSIS — R350 Frequency of micturition: Secondary | ICD-10-CM | POA: Diagnosis not present

## 2017-09-20 DIAGNOSIS — N2889 Other specified disorders of kidney and ureter: Secondary | ICD-10-CM | POA: Diagnosis not present

## 2017-09-20 DIAGNOSIS — J988 Other specified respiratory disorders: Secondary | ICD-10-CM | POA: Diagnosis not present

## 2017-09-22 ENCOUNTER — Ambulatory Visit: Payer: Medicare Other | Admitting: Physical Therapy

## 2017-09-22 ENCOUNTER — Encounter: Payer: Self-pay | Admitting: Physical Therapy

## 2017-09-22 DIAGNOSIS — M25512 Pain in left shoulder: Secondary | ICD-10-CM

## 2017-09-22 DIAGNOSIS — M25511 Pain in right shoulder: Secondary | ICD-10-CM | POA: Diagnosis not present

## 2017-09-22 DIAGNOSIS — M6281 Muscle weakness (generalized): Secondary | ICD-10-CM | POA: Diagnosis not present

## 2017-09-22 DIAGNOSIS — M25612 Stiffness of left shoulder, not elsewhere classified: Secondary | ICD-10-CM

## 2017-09-22 DIAGNOSIS — M25611 Stiffness of right shoulder, not elsewhere classified: Secondary | ICD-10-CM | POA: Diagnosis not present

## 2017-09-22 NOTE — Therapy (Signed)
[facility] [address] [place], [place], [address] Phone: [phone]   Fax:  [phone]   Progress Note Reporting Period 08/17/2017 to 09/22/2017  See note below for Objective Data and Assessment of Progress/Goals.       Physical Therapy Treatment   Patient Details  Name: Kendra [name] MRN: [id number] Date of Birth: Aug 10, 1935 Referring Provider: Dr. Lawerance Cruel   Encounter Date: 09/22/2017  PT End of Session - 09/22/17 0955    Visit Number  10    Date for PT Re-Evaluation  10/12/17    Authorization Type  Medicare KX at visit 15    PT Start Time  0930    PT Stop Time  1010    PT Time Calculation (min)  40 min    Activity Tolerance  Patient tolerated treatment well    Behavior During Therapy  [facility] for tasks assessed/performed       Past Medical History:  Diagnosis Date  . Asthma   . CAD (coronary artery disease)    last cath 08/2006  . Carotid artery disease (HCC)    moderate left ICA stenosis by 2.6 ultrasound  . DJD (degenerative joint disease), lumbar   . Hearing loss   . HTN (hypertension)   . Hyperlipidemia   . Vision loss of right eye     Past Surgical History:  Procedure Laterality Date  . ABDOMINAL HYSTERECTOMY  1975  . APPENDECTOMY    . CORONARY ANGIOPLASTY WITH STENT PLACEMENT  08/2006   taxus stent x2 to LAD, 2.5x61m and 2.[facility]x88[facility] [place]  [facility]    There were no vitals filed for this visit.  Subjective Assessment - 09/22/17 0937    Subjective  I have a UTI.      Pertinent History  Cardiac stents 2008;  hard of hearing    Limitations  House hold activities    Diagnostic tests  none    Patient Stated Goals  Get rid of muscle spasm;  learn what I can do to strengthen muscles around joints    Currently in Pain?  Yes    Pain Score  5     Pain Location  Shoulder    Pain Orientation   Left;Right left > right    Pain Descriptors / Indicators  Aching;Sharp    Pain Type  Chronic pain    Pain Onset  More than a month ago    Pain Frequency  Intermittent    Aggravating Factors   when I reach up I feel a pull and some pain    Pain Relieving Factors  biofreeze    Multiple Pain Sites  No         [facility] PT Assessment - 09/22/17 0001      Assessment   Medical Diagnosis  bil shoulder pain    Referring Provider  Dr. ChLawerance Cruel  Hand Dominance  Right    Prior Therapy  not for shoulder, neck      Precautions   Precautions  None      Restrictions   Weight Bearing Restrictions  No      Balance Screen   Has the patient fallen in the past 6 months  No    Has the patient had a decrease in activity level because of a fear of falling?   No    Is the  patient reluctant to leave their home because of a fear of falling?   No      Home Film/video editor residence      Prior Function   Level of Independence  Independent      Cognition   Overall Cognitive Status  Within Functional Limits for tasks assessed      Observation/Other Assessments   Focus on Therapeutic Outcomes (FOTO)   38% limitation       Posture/Postural Control   Posture/Postural Control  No significant limitations      AROM   AROM Assessment Site  Shoulder    Right Shoulder Flexion  154 Degrees    Right Shoulder ABduction  163 Degrees    Right Shoulder External Rotation  66 Degrees    Left Shoulder Flexion  146 Degrees    Left Shoulder ABduction  155 Degrees    Left Shoulder External Rotation  40 Degrees      Strength   Right Shoulder Flexion  4/5    Right Shoulder Extension  5/5    Right Shoulder ABduction  4/5    Right Shoulder Internal Rotation  4-/5    Right Shoulder External Rotation  4-/5    Left Shoulder Flexion  4-/5    Left Shoulder Extension  5/5    Left Shoulder ABduction  4/5    Left Shoulder Internal Rotation  4/5    Left Shoulder External Rotation   4/5      Empty Can test   Findings  Negative      Lag time at 0 degrees   Findings  Positive      Drop Arm test   Findings  Negative                   [facility] Adult PT Treatment/Exercise - 09/22/17 0001      Shoulder Exercises: Supine   Flexion  Strengthening;Right;Left;Both;5 reps;Weights head of bed elevated to increase difficulty     Shoulder Flexion Weight (lbs)  1#    Other Supine Exercises  1# 12/6 and 9/3 5x each way and each arm  head of bead elevated to increase difficulty      Shoulder Exercises: Standing   Other Standing Exercises  wall slides 5x right/left       Shoulder Exercises: ROM/Strengthening   Other ROM/Strengthening Exercises  UE Ranger L20 10x right and left      Shoulder Exercises: Isometric Strengthening   External Rotation  5X5" using forearm for pressure due to wrist pain    ABduction  5X5" used forearm for pressure due to wrist pain               PT Short Term Goals - 09/16/17 1107      PT SHORT TERM GOAL #1   Title  pt will be independent with initial HEP for bilateral shoulder ROM    Status  Achieved      PT SHORT TERM GOAL #2   Title  pt reports 25% less bil shoulder pain with daily activities including reaching, dressing    Baseline  50%    Status  Achieved      PT SHORT TERM GOAL #3   Title  The patient will have bil shoulder flexion to 140 degrees needed for reaching above head level     Status  Achieved      PT SHORT TERM GOAL #4   Title  Patient will have glenohumeral and  scapular strength to lift a 1# object to an eye level shelf with minimal discomfort    Status  Achieved        PT Long Term Goals - 09/22/17 6387      PT LONG TERM GOAL #1   Title  The patient will be independent with safe self progression of HEP    Baseline  still learning    Time  8    Period  Weeks    Status  On-going      PT LONG TERM GOAL #2   Title  pain at least 50% improved in bilateral shoulders with dressing and reaching  overhead    Baseline  30% better    Time  8    Period  Weeks    Status  On-going      PT LONG TERM GOAL #3   Title  Bilateral shoulder strength grossly 4/5 needed for lifting a pot or dish with greater ease     Baseline  4-/5    Time  8    Period  Weeks    Status  On-going      PT LONG TERM GOAL #4   Title  Patient will have improved shoulder flexion/scaption to 150 degrees and external rotation to 40 degrees needed for reaching and dressing    Time  8    Period  Weeks    Status  Achieved      PT LONG TERM GOAL #5   Title  FOTO functional outcome score improved from 48% limitation to 36% indicating improved function with less pain    Baseline  38% limitation    Time  8    Period  Weeks    Status  On-going            Plan - 09/22/17 1015    Clinical Impression Statement  Patient has increased strength and ROM of bilateral shoulders.  Her overall pain has decreased by 30% with daily acitivities. Therapist modifying exercises multiple times because of wrist pain.  Patient performs her exercises slowly and correctly.  Patient has met her all her STG and LTG number 4.  Patient will benefit from skilled therapy to strengthen bilateral shoulders to reduce her pain and improve her funciton while monitoring for her wrist pain.     Rehab Potential  Good    Clinical Impairments Affecting Rehab Potential  tape sensitivity    PT Frequency  2x / week    PT Duration  8 weeks    PT Treatment/Interventions  ADLs/Self Care Home Management;Iontophoresis 29m/ml Dexamethasone;Dry needling;Moist Heat;Ultrasound;Electrical Stimulation;Therapeutic exercise;Therapeutic activities;Patient/family education;Manual techniques;Taping;Neuromuscular re-education    PT Next Visit Plan   shoulder isometrics;   wall push ups, ball on wall circles;  keep neutral wrist with ex;    glenohumeral and scapular strengthening;   bil elbow extension ROM;  UE Ranger; progress HEP    PT Home Exercise Plan  progress as  needed    Consulted and Agree with Plan of Care  Patient       Patient will benefit from skilled therapeutic intervention in order to improve the following deficits and impairments:  Pain, Postural dysfunction, Decreased range of motion, Decreased strength, Impaired UE functional use  Visit Diagnosis: Stiffness of left shoulder, not elsewhere classified  Acute pain of right shoulder  Acute pain of left shoulder  Stiffness of right shoulder, not elsewhere classified  Muscle weakness (generalized)     Problem List Patient Active Problem List  Diagnosis Date Noted  . Carotid artery disease ([place]) 09/19/2014  . Disturbance of skin sensation 11/17/2013  . Coronary artery disease 03/31/2013  . Essential hypertension 03/31/2013  . Hyperlipidemia 03/31/2013    Earlie Counts, PT 09/22/17 11:36 AM   [facility] [address], [place], [place], [address] Phone: [phone]   Fax:  [phone]  Name: Tayte Childers MRN: [id number] Date of Birth: 1935-07-11

## 2017-09-28 ENCOUNTER — Other Ambulatory Visit: Payer: Self-pay | Admitting: Family Medicine

## 2017-09-28 DIAGNOSIS — N2889 Other specified disorders of kidney and ureter: Secondary | ICD-10-CM

## 2017-09-30 ENCOUNTER — Encounter: Payer: Self-pay | Admitting: Physical Therapy

## 2017-09-30 ENCOUNTER — Ambulatory Visit
Admission: RE | Admit: 2017-09-30 | Discharge: 2017-09-30 | Disposition: A | Discharge status: Home / Self Care | Payer: Medicare Other | Source: Ambulatory Visit | Attending: Family Medicine | Admitting: Family Medicine

## 2017-09-30 ENCOUNTER — Ambulatory Visit: Payer: Medicare Other | Admitting: Physical Therapy

## 2017-09-30 DIAGNOSIS — M25512 Pain in left shoulder: Secondary | ICD-10-CM | POA: Diagnosis not present

## 2017-09-30 DIAGNOSIS — M25612 Stiffness of left shoulder, not elsewhere classified: Secondary | ICD-10-CM

## 2017-09-30 DIAGNOSIS — M25511 Pain in right shoulder: Secondary | ICD-10-CM | POA: Diagnosis not present

## 2017-09-30 DIAGNOSIS — N2889 Other specified disorders of kidney and ureter: Secondary | ICD-10-CM

## 2017-09-30 DIAGNOSIS — M6281 Muscle weakness (generalized): Secondary | ICD-10-CM | POA: Diagnosis not present

## 2017-09-30 DIAGNOSIS — M25611 Stiffness of right shoulder, not elsewhere classified: Secondary | ICD-10-CM

## 2017-09-30 DIAGNOSIS — R3129 Other microscopic hematuria: Secondary | ICD-10-CM | POA: Diagnosis not present

## 2017-09-30 NOTE — Therapy (Signed)
[facility] [address] [place], [place], [address] Phone: [phone]   Fax:  [phone]  Physical Therapy Treatment  Patient Details  Name: Kendra [name] MRN: [id number] Date of Birth: 10/27/1935 Referring Provider: Dr. Lawerance Cruel   Encounter Date: 09/30/2017  PT End of Session - 09/30/17 1033    Visit Number  11    Date for PT Re-Evaluation  10/12/17    Authorization Type  Medicare KX at visit 15    PT Start Time  1015    PT Stop Time  1055    PT Time Calculation (min)  40 min    Activity Tolerance  Patient tolerated treatment well    Behavior During Therapy  [facility] for tasks assessed/performed       Past Medical History:  Diagnosis Date  . Asthma   . CAD (coronary artery disease)    last cath 08/2006  . Carotid artery disease (HCC)    moderate left ICA stenosis by 2.6 ultrasound  . DJD (degenerative joint disease), lumbar   . Hearing loss   . HTN (hypertension)   . Hyperlipidemia   . Vision loss of right eye     Past Surgical History:  Procedure Laterality Date  . ABDOMINAL HYSTERECTOMY  1975  . APPENDECTOMY    . CORONARY ANGIOPLASTY WITH STENT PLACEMENT  08/2006   taxus stent x2 to LAD, 2.5x59mm and 2.5x53mm; done at [facility]  . RENAL ANGIOGRAM  [facility] in [place]  . TONSILLECTOMY  1994    There were no vitals filed for this visit.  Subjective Assessment - 09/30/17 1024    Subjective  I am feeling better from the UTI. My shoulder is 60% better since the initial evaluation.      Pertinent History  Cardiac stents 2008;  hard of hearing    Limitations  House hold activities    Diagnostic tests  none    Patient Stated Goals  Get rid of muscle spasm;  learn what I can do to strengthen muscles around joints    Currently in Pain?  Yes    Pain Score  4     Pain Location  Shoulder    Pain Orientation  Right;Left    Pain Descriptors / Indicators  Aching;Sharp    Pain Type  Chronic pain    Pain Onset  More than a month ago    Pain Frequency  Intermittent    Aggravating Factors   laying on shoulder; reaching upward    Pain Relieving Factors  biofreeze    Multiple Pain Sites  No                       [facility] Adult PT Treatment/Exercise - 09/30/17 0001      Self-Care   Self-Care  ADL's      Therapeutic Activites    Therapeutic Activities  ADL's    ADL's  laying on side with pillow or towel roll under chest when on side      Shoulder Exercises: Supine   Flexion  Strengthening;Right;Left;Both;5 reps;Weights head of bed elevated to increase difficulty     Shoulder Flexion Weight (lbs)  2#    Other Supine Exercises  1# 12/6 and 9/3 5x each way and each arm ; then scapula retraction and protraction head of bead elevated to increase difficulty      Shoulder Exercises: Seated   Flexion  Strengthening;Both;10 reps;Weights to 90  degrees    Flexion Weight (lbs)  1    Abduction  Strengthening;Both;10 reps;Weights to 90 degrees    ABduction Weight (lbs)  1    Other Seated Exercises  scaption 1# 10x bil      Shoulder Exercises: Standing   Extension  Strengthening;Both;10 reps;Weights    Theraband Level (Shoulder Extension)  Level 2 (Red) monitor for wrist pain    Row  Strengthening;Both;15 reps    Theraband Level (Shoulder Row)  Level 2 (Red)      Manual Therapy   Manual Therapy  Passive ROM;Soft tissue mobilization    Soft tissue mobilization  to bil. upper trap and levator scapula    Passive ROM  bil. shoulder flexion and abduction               PT Short Term Goals - 09/16/17 1107      PT SHORT TERM GOAL #1   Title  pt will be independent with initial HEP for bilateral shoulder ROM    Status  Achieved      PT SHORT TERM GOAL #2   Title  pt reports 25% less bil shoulder pain with daily activities including reaching, dressing    Baseline  50%    Status  Achieved      PT SHORT TERM GOAL #3   Title  The patient will  have bil shoulder flexion to 140 degrees needed for reaching above head level     Status  Achieved      PT SHORT TERM GOAL #4   Title  Patient will have glenohumeral and scapular strength to lift a 1# object to an eye level shelf with minimal discomfort    Status  Achieved        PT Long Term Goals - 09/30/17 1058      PT LONG TERM GOAL #2   Title  pain at least 50% improved in bilateral shoulders with dressing and reaching overhead    Time  8    Period  Weeks    Status  Achieved            Plan - 09/30/17 1055    Clinical Impression Statement  Patient reports her shoulder pain is 60% better.  Patient was able to use increased weight while monitoring for wrist pain.  Patient did not do exercises overhead  due to pain.  Patient bil. shoulder PROM limited by 20 degrees for flexion and abduction.  Patient will benefit from skilled therapy to strengthen bilateral shoulders to reduce her pain and imporve her function while monitoring for her wrist  pain.     Rehab Potential  Good    Clinical Impairments Affecting Rehab Potential  tape sensitivity    PT Frequency  2x / week    PT Duration  8 weeks    PT Treatment/Interventions  ADLs/Self Care Home Management;Iontophoresis 4mg /ml Dexamethasone;Dry needling;Moist Heat;Ultrasound;Electrical Stimulation;Therapeutic exercise;Therapeutic activities;Patient/family education;Manual techniques;Taping;Neuromuscular re-education    PT Next Visit Plan   shoulder isometrics;   wall push ups, ball on wall circles;  keep neutral wrist with ex;    glenohumeral and scapular strengthening;   bil elbow extension ROM;  UE Ranger; progress HEP    PT Home Exercise Plan  progress as needed    Consulted and Agree with Plan of Care  Patient       Patient will benefit from skilled therapeutic intervention in order to improve the following deficits and impairments:  Pain, Postural dysfunction, Decreased range of motion,  Decreased strength, Impaired UE  functional use  Visit Diagnosis: Stiffness of left shoulder, not elsewhere classified  Acute pain of right shoulder  Acute pain of left shoulder  Muscle weakness (generalized)  Stiffness of right shoulder, not elsewhere classified     Problem List Patient Active Problem List   Diagnosis Date Noted  . Carotid artery disease ([place]) 09/19/2014  . Disturbance of skin sensation 11/17/2013  . Coronary artery disease 03/31/2013  . Essential hypertension 03/31/2013  . Hyperlipidemia 03/31/2013    Earlie Counts, PT 09/30/17 10:59 AM    [facility] [address] [place], [place], [address] Phone: [phone]   Fax:  [phone]  Name: Kendra [name] MRN: [id number] Date of Birth: 1935/12/15

## 2017-10-07 ENCOUNTER — Ambulatory Visit: Payer: Medicare Other | Attending: Family Medicine | Admitting: Physical Therapy

## 2017-10-07 ENCOUNTER — Encounter: Payer: Self-pay | Admitting: Physical Therapy

## 2017-10-07 DIAGNOSIS — M25611 Stiffness of right shoulder, not elsewhere classified: Secondary | ICD-10-CM | POA: Diagnosis not present

## 2017-10-07 DIAGNOSIS — M25511 Pain in right shoulder: Secondary | ICD-10-CM

## 2017-10-07 DIAGNOSIS — M25612 Stiffness of left shoulder, not elsewhere classified: Secondary | ICD-10-CM | POA: Insufficient documentation

## 2017-10-07 DIAGNOSIS — M25512 Pain in left shoulder: Secondary | ICD-10-CM

## 2017-10-07 DIAGNOSIS — M6281 Muscle weakness (generalized): Secondary | ICD-10-CM | POA: Diagnosis not present

## 2017-10-07 NOTE — Therapy (Signed)
[facility] [address] [place], [place], [address] Phone: [phone]   Fax:  [phone]  Physical Therapy Treatment  Patient Details  Name: Kendra [name] MRN: [id number] Date of Birth: Dec 28, 1935 Referring Provider: Dr. Lawerance Cruel   Encounter Date: 10/07/2017  PT End of Session - 10/07/17 1119    Visit Number  12    Date for PT Re-Evaluation  10/12/17    Authorization Type  Medicare KX at visit 15    PT Start Time  1103    PT Stop Time  1143    PT Time Calculation (min)  40 min    Activity Tolerance  Patient tolerated treatment well       Past Medical History:  Diagnosis Date  . Asthma   . CAD (coronary artery disease)    last cath 08/2006  . Carotid artery disease (HCC)    moderate left ICA stenosis by 2.6 ultrasound  . DJD (degenerative joint disease), lumbar   . Hearing loss   . HTN (hypertension)   . Hyperlipidemia   . Vision loss of right eye     Past Surgical History:  Procedure Laterality Date  . ABDOMINAL HYSTERECTOMY  1975  . APPENDECTOMY    . CORONARY ANGIOPLASTY WITH STENT PLACEMENT  08/2006   taxus stent x2 to LAD, 2.5x41mm and 2.5x79mm; done at [facility]  . RENAL ANGIOGRAM  [facility] in [place]  . TONSILLECTOMY  1994    There were no vitals filed for this visit.  Subjective Assessment - 10/07/17 1103    Subjective  My shoulder is feeling better.    I'm stiff in the morning but loosens up as the day goes on.  Easier to reach into the cabinets at home.   I haven't done as much ex at home b/c of my husband's travel.      Pertinent History  Cardiac stents 2008;  hard of hearing    Patient Stated Goals  Get rid of muscle spasm;  learn what I can do to strengthen muscles around joints    Currently in Pain?  Yes    Pain Score  4     Pain Location  Shoulder    Pain Orientation  Right;Left    Pain Type  Chronic pain    Aggravating Factors   lying on shoulder          [facility] PT Assessment - 10/07/17 0001      Strength   Right Shoulder Flexion  4/5    Right Shoulder Extension  5/5    Right Shoulder ABduction  4/5    Right Shoulder Internal Rotation  4/5    Right Shoulder External Rotation  4/5    Left Shoulder ABduction  4/5    Left Shoulder Internal Rotation  4/5    Left Shoulder External Rotation  4/5                   [facility] Adult PT Treatment/Exercise - 10/07/17 0001      Therapeutic Activites    Therapeutic Activities  ADL's    ADL's  reaching, pulling      Neuro Re-ed    Neuro Re-ed Details   scapular stabilizers; triceps, biceps, deltoids      Shoulder Exercises: Supine   Protraction  Strengthening;Right;Left;10 reps;Weights    Protraction Weight (lbs)  2 head on wedge    Other Supine Exercises  2# 12/6 bil 10x; 3/9  bil 10x      Shoulder Exercises: Seated   Flexion  Strengthening;Both;10 reps;Weights to 90 degrees    Flexion Weight (lbs)  1    Abduction  Strengthening;Both;10 reps;Weights to 90 degrees    ABduction Weight (lbs)  1    Other Seated Exercises  scaption 1# 10x bil      Shoulder Exercises: Standing   Extension  Strengthening;Both;20 reps;Weights    Theraband Level (Shoulder Extension)  Level 2 (Red) monitor for wrist pain    Row  Strengthening;Both;20 reps    Theraband Level (Shoulder Row)  Level 2 (Red)    Other Standing Exercises  tricep extensions red band 15x    Other Standing Exercises  bicep curls red band 15x      Shoulder Exercises: ROM/Strengthening   Wall Pushups  10 reps with red ball    Other ROM/Strengthening Exercises  UE Ranger L20 10x right and left               PT Short Term Goals - 09/16/17 1107      PT SHORT TERM GOAL #1   Title  pt will be independent with initial HEP for bilateral shoulder ROM    Status  Achieved      PT SHORT TERM GOAL #2   Title  pt reports 25% less bil shoulder pain with daily activities including reaching, dressing    Baseline  50%     Status  Achieved      PT SHORT TERM GOAL #3   Title  The patient will have bil shoulder flexion to 140 degrees needed for reaching above head level     Status  Achieved      PT SHORT TERM GOAL #4   Title  Patient will have glenohumeral and scapular strength to lift a 1# object to an eye level shelf with minimal discomfort    Status  Achieved        PT Long Term Goals - 10/07/17 1126      PT LONG TERM GOAL #1   Title  The patient will be independent with safe self progression of HEP    Time  8    Period  Weeks    Status  On-going      PT LONG TERM GOAL #2   Title  pain at least 50% improved in bilateral shoulders with dressing and reaching overhead    Status  Achieved      PT LONG TERM GOAL #3   Title  Bilateral shoulder strength grossly 4/5 needed for lifting a pot or dish with greater ease     Status  Achieved      PT LONG TERM GOAL #4   Title  Patient will have improved shoulder flexion/scaption to 150 degrees and external rotation to 40 degrees needed for reaching and dressing    Status  Achieved      PT LONG TERM GOAL #5   Title  FOTO functional outcome score improved from 48% limitation to 36% indicating improved function with less pain    Time  8    Period  Weeks    Status  On-going            Plan - 10/07/17 1119    Clinical Impression Statement  The patient reports her wrist bothers her with housework more than her shoulder.  She now prefers hand dumbells to wrist weights with resisted strengthening.  Improving glenohumeral and scapular muscle strength.   Minimal verbal  cues needed for optimal thumb up position and to avoid compensatory shoulder shrug.  May be ready for discharge in 2-3 visits.      Rehab Potential  Good    Clinical Impairments Affecting Rehab Potential  tape sensitivity    PT Frequency  2x / week    PT Duration  8 weeks    PT Treatment/Interventions  ADLs/Self Care Home Management;Iontophoresis 4mg /ml Dexamethasone;Dry needling;Moist  Heat;Ultrasound;Electrical Stimulation;Therapeutic exercise;Therapeutic activities;Patient/family education;Manual techniques;Taping;Neuromuscular re-education    PT Next Visit Plan   check progress toward goals and ROM;  shoulder isometrics;   wall push ups, ball on wall circles;  keep neutral wrist with ex;    glenohumeral and scapular strengthening;   bil elbow extension ROM;  UE Ranger; progress HEP       Patient will benefit from skilled therapeutic intervention in order to improve the following deficits and impairments:  Pain, Postural dysfunction, Decreased range of motion, Decreased strength, Impaired UE functional use  Visit Diagnosis: Stiffness of left shoulder, not elsewhere classified  Acute pain of right shoulder  Acute pain of left shoulder  Muscle weakness (generalized)  Stiffness of right shoulder, not elsewhere classified     Problem List Patient Active Problem List   Diagnosis Date Noted  . Carotid artery disease ([place]) 09/19/2014  . Disturbance of skin sensation 11/17/2013  . Coronary artery disease 03/31/2013  . Essential hypertension 03/31/2013  . Hyperlipidemia 03/31/2013   Ruben Im, PT 10/07/17 5:15 PM Phone: [phone] Fax: [phone]  Alvera Singh 10/07/2017, 5:14 PM  [facility] [address] [place], [place], [address] Phone: [phone]   Fax:  [phone]  Name: Kendra [name] MRN: [id number] Date of Birth: 01-27-1936

## 2017-10-12 ENCOUNTER — Encounter: Payer: Self-pay | Admitting: Physical Therapy

## 2017-10-12 ENCOUNTER — Ambulatory Visit: Payer: Medicare Other | Admitting: Physical Therapy

## 2017-10-12 DIAGNOSIS — M25612 Stiffness of left shoulder, not elsewhere classified: Secondary | ICD-10-CM

## 2017-10-12 DIAGNOSIS — M25611 Stiffness of right shoulder, not elsewhere classified: Secondary | ICD-10-CM

## 2017-10-12 DIAGNOSIS — M25512 Pain in left shoulder: Secondary | ICD-10-CM | POA: Diagnosis not present

## 2017-10-12 DIAGNOSIS — M25511 Pain in right shoulder: Secondary | ICD-10-CM

## 2017-10-12 DIAGNOSIS — M6281 Muscle weakness (generalized): Secondary | ICD-10-CM

## 2017-10-12 NOTE — Patient Instructions (Signed)
    Access Code: [email or web address]  URL: [email or web address]  Date: 10/12/2017  Prepared by: Ruben Im   Exercises  Seated Shoulder Flexion with Dumbbells - 10 reps - 1 sets - 2 hold - 1x daily - 3x weekly  Scaption with Dumbbells - 10 reps - 1 sets - 2 hold - 1x daily - 3x weekly  Seated Shoulder Horizontal Abduction with Dumbbells - Palms Down - 10 reps - 1 sets - 2 hold - 1x daily - 3x weekly      [facility] [address], [place], [place] [address] Phone # [phone] Fax [phone]

## 2017-10-12 NOTE — Therapy (Signed)
[facility] [address] [place], [place], [address] Phone: [phone]   Fax:  [phone]  Physical Therapy Treatment/Discharge Summary  Patient Details  Name: Kendra [name] MRN: [id number] Date of Birth: 01-May-1936 Referring Provider: Dr. Lawerance Cruel   Encounter Date: 10/12/2017  PT End of Session - 10/12/17 1051    Visit Number  13    Date for PT Re-Evaluation  10/12/17    Authorization Type  Medicare KX at visit 15    PT Start Time  1016    PT Stop Time  1100    PT Time Calculation (min)  44 min    Activity Tolerance  Patient tolerated treatment well       Past Medical History:  Diagnosis Date  . Asthma   . CAD (coronary artery disease)    last cath 08/2006  . Carotid artery disease (HCC)    moderate left ICA stenosis by 2.6 ultrasound  . DJD (degenerative joint disease), lumbar   . Hearing loss   . HTN (hypertension)   . Hyperlipidemia   . Vision loss of right eye     Past Surgical History:  Procedure Laterality Date  . ABDOMINAL HYSTERECTOMY  1975  . APPENDECTOMY    . CORONARY ANGIOPLASTY WITH STENT PLACEMENT  08/2006   taxus stent x2 to LAD, 2.5x23m and [facility] TONSILLECT[facility]  1994    There were no vitals filed for this visit.  Subjective Assessment - 10/12/17 1018    Subjective  Mostly just stiff.  [place]  I could hear the joint cracking.      Pertinent History  Cardiac stents 2008;  hard of hearing    Currently in Pain?  Yes    Pain Score  5  3/10 on left    Pain Location  Shoulder    Pain Orientation  Right    Pain Type  Chronic pain    Aggravating Factors   lying on shoulder         [facility] PT Assessment - 10/12/17 0001      Observation/Other Assessments   Focus on Therapeutic Outcomes (FOTO)   38% limitation       AROM   Right Shoulder Flexion   154 Degrees    Right Shoulder ABduction  163 Degrees    Right Shoulder Internal Rotation  -- T8    Right Shoulder External Rotation  62 Degrees    Left Shoulder Flexion  140 Degrees    Left Shoulder ABduction  160 Degrees    Left Shoulder Internal Rotation  -- T8    Left Shoulder External Rotation  45 Degrees    Right Elbow Extension  18    Left Elbow Extension  10      Strength   Right Shoulder Flexion  4/5    Right Shoulder Extension  5/5    Right Shoulder ABduction  4/5    Right Shoulder Internal Rotation  4/5    Right Shoulder External Rotation  4/5    Left Shoulder Flexion  4/5    Left Shoulder Extension  5/5    Left Shoulder ABduction  4/5    Left Shoulder Internal Rotation  4/5    Left Shoulder External Rotation  4/5  [facility] Adult PT Treatment/Exercise - 10/12/17 0001      Therapeutic Activites    Therapeutic Activities  ADL's    ADL's  reaching, pulling      Neuro Re-ed    Neuro Re-ed Details   scapular stabilizers; triceps, biceps, deltoids      Shoulder Exercises: Seated   Flexion  Strengthening;Both;10 reps;Weights to 100 degrees    Abduction  Strengthening;Both;10 reps;Weights to 100 degrees    Other Seated Exercises  scaption 1# 10x bil    Other Seated Exercises  discussed safe self progression of HEP with light resistance and gradual increase in reps      Shoulder Exercises: Standing   Other Standing Exercises  tricep extensions red band 15x    Other Standing Exercises  bicep curls red band 15x             PT Education - 10/12/17 1051    Education provided  Yes    Education Details  1# shoulder 3 ways    Person(s) Educated  Patient    Methods  Explanation;Demonstration;Handout    Comprehension  Returned demonstration;Verbalized understanding       PT Short Term Goals - 10/12/17 1346      PT SHORT TERM GOAL #1   Title  pt will be independent with initial HEP for bilateral shoulder ROM    Status  Achieved       PT SHORT TERM GOAL #2   Title  pt reports 25% less bil shoulder pain with daily activities including reaching, dressing    Status  Achieved      PT SHORT TERM GOAL #3   Title  The patient will have bil shoulder flexion to 140 degrees needed for reaching above head level     Status  Achieved      PT SHORT TERM GOAL #4   Title  Patient will have glenohumeral and scapular strength to lift a 1# object to an eye level shelf with minimal discomfort    Status  Achieved        PT Long Term Goals - 10/12/17 1034      PT LONG TERM GOAL #1   Title  The patient will be independent with safe self progression of HEP    Status  Achieved      PT LONG TERM GOAL #2   Title  pain at least 50% improved in bilateral shoulders with dressing and reaching overhead    Status  Achieved      PT LONG TERM GOAL #3   Title  Bilateral shoulder strength grossly 4/5 needed for lifting a pot or dish with greater ease     Status  Achieved      PT LONG TERM GOAL #4   Title  Patient will have improved shoulder flexion/scaption to 150 degrees and external rotation to 40 degrees needed for reaching and dressing    Status  Achieved      PT LONG TERM GOAL #5   Title  FOTO functional outcome score improved from 48% limitation to 36% indicating improved function with less pain    Status  Partially Met            Plan - 10/12/17 1343    Clinical Impression Statement  The patient reports she is 50-75% better overall with her bilateral shoulder pain.  Her ROM has improved in most planes in the shoulder and a slight improvement in bilateral elbow extension.  Her shoulder strength has improved as  well and is grossly 4/5 throughout.  Her FOTO functional outcome score has improved from a 48% limitation to 38%.  She has been instructed in daily ROM exercises and low level resistance strengthening HEP and she agrees with her readiness for discharge to an independent HEP at this time.         Patient will benefit from  skilled therapeutic intervention in order to improve the following deficits and impairments:     Visit Diagnosis: Stiffness of left shoulder, not elsewhere classified  Acute pain of right shoulder  Acute pain of left shoulder  Muscle weakness (generalized)  Stiffness of right shoulder, not elsewhere classified  PHYSICAL THERAPY DISCHARGE SUMMARY  Visits from Start of Care: 13  Current functional level related to goals / functional outcomes: See clinical impressions above   Remaining deficits: As above   Education / Equipment: Comprehensive HEP  Plan: Patient agrees to discharge.  Patient goals were met. Patient is being discharged due to meeting the stated rehab goals.  ?????          Problem List Patient Active Problem List   Diagnosis Date Noted  . Carotid artery disease ([place]) 09/19/2014  . Disturbance of skin sensation 11/17/2013  . Coronary artery disease 03/31/2013  . Essential hypertension 03/31/2013  . Hyperlipidemia 03/31/2013   Ruben Im, PT 10/12/17 1:48 PM Phone: [phone] Fax: [phone]   Alvera Singh 10/12/2017, 1:47 PM  [facility] [address] [place], [place], [address] Phone: [phone]   Fax:  [phone]  Name: Rylan Kaufmann MRN: [id number] Date of Birth: 04/02/1936

## 2017-10-17 DIAGNOSIS — Z800 Family history of malignant neoplasm of digestive organs: Secondary | ICD-10-CM | POA: Diagnosis not present

## 2017-11-10 DIAGNOSIS — I2510 Atherosclerotic heart disease of native coronary artery without angina pectoris: Secondary | ICD-10-CM | POA: Diagnosis not present

## 2017-11-10 DIAGNOSIS — R350 Frequency of micturition: Secondary | ICD-10-CM | POA: Diagnosis not present

## 2017-11-10 DIAGNOSIS — Z800 Family history of malignant neoplasm of digestive organs: Secondary | ICD-10-CM | POA: Diagnosis not present

## 2017-11-10 DIAGNOSIS — G459 Transient cerebral ischemic attack, unspecified: Secondary | ICD-10-CM | POA: Diagnosis not present

## 2017-11-10 DIAGNOSIS — K5792 Diverticulitis of intestine, part unspecified, without perforation or abscess without bleeding: Secondary | ICD-10-CM | POA: Diagnosis not present

## 2017-11-10 DIAGNOSIS — R739 Hyperglycemia, unspecified: Secondary | ICD-10-CM | POA: Diagnosis not present

## 2017-11-10 DIAGNOSIS — E782 Mixed hyperlipidemia: Secondary | ICD-10-CM | POA: Diagnosis not present

## 2017-11-10 DIAGNOSIS — I10 Essential (primary) hypertension: Secondary | ICD-10-CM | POA: Diagnosis not present

## 2017-11-10 DIAGNOSIS — J4541 Moderate persistent asthma with (acute) exacerbation: Secondary | ICD-10-CM | POA: Diagnosis not present

## 2017-11-10 DIAGNOSIS — K5900 Constipation, unspecified: Secondary | ICD-10-CM | POA: Diagnosis not present

## 2017-11-15 DIAGNOSIS — H25013 Cortical age-related cataract, bilateral: Secondary | ICD-10-CM | POA: Diagnosis not present

## 2017-11-15 DIAGNOSIS — H25042 Posterior subcapsular polar age-related cataract, left eye: Secondary | ICD-10-CM | POA: Diagnosis not present

## 2017-11-15 DIAGNOSIS — H2513 Age-related nuclear cataract, bilateral: Secondary | ICD-10-CM | POA: Diagnosis not present

## 2017-11-15 DIAGNOSIS — H40013 Open angle with borderline findings, low risk, bilateral: Secondary | ICD-10-CM | POA: Diagnosis not present

## 2017-11-15 DIAGNOSIS — H47011 Ischemic optic neuropathy, right eye: Secondary | ICD-10-CM | POA: Diagnosis not present

## 2017-11-16 DIAGNOSIS — I2510 Atherosclerotic heart disease of native coronary artery without angina pectoris: Secondary | ICD-10-CM | POA: Diagnosis not present

## 2017-11-16 DIAGNOSIS — Z800 Family history of malignant neoplasm of digestive organs: Secondary | ICD-10-CM | POA: Diagnosis not present

## 2017-11-16 DIAGNOSIS — L989 Disorder of the skin and subcutaneous tissue, unspecified: Secondary | ICD-10-CM | POA: Diagnosis not present

## 2017-11-16 DIAGNOSIS — G459 Transient cerebral ischemic attack, unspecified: Secondary | ICD-10-CM | POA: Diagnosis not present

## 2017-11-16 DIAGNOSIS — E782 Mixed hyperlipidemia: Secondary | ICD-10-CM | POA: Diagnosis not present

## 2017-11-16 DIAGNOSIS — R350 Frequency of micturition: Secondary | ICD-10-CM | POA: Diagnosis not present

## 2017-11-16 DIAGNOSIS — K5792 Diverticulitis of intestine, part unspecified, without perforation or abscess without bleeding: Secondary | ICD-10-CM | POA: Diagnosis not present

## 2017-11-16 DIAGNOSIS — K5900 Constipation, unspecified: Secondary | ICD-10-CM | POA: Diagnosis not present

## 2017-11-16 DIAGNOSIS — H9193 Unspecified hearing loss, bilateral: Secondary | ICD-10-CM | POA: Diagnosis not present

## 2017-11-16 DIAGNOSIS — J4541 Moderate persistent asthma with (acute) exacerbation: Secondary | ICD-10-CM | POA: Diagnosis not present

## 2017-11-16 DIAGNOSIS — R739 Hyperglycemia, unspecified: Secondary | ICD-10-CM | POA: Diagnosis not present

## 2017-11-16 DIAGNOSIS — I10 Essential (primary) hypertension: Secondary | ICD-10-CM | POA: Diagnosis not present

## 2017-12-03 DIAGNOSIS — D225 Melanocytic nevi of trunk: Secondary | ICD-10-CM | POA: Diagnosis not present

## 2017-12-03 DIAGNOSIS — B078 Other viral warts: Secondary | ICD-10-CM | POA: Diagnosis not present

## 2017-12-16 ENCOUNTER — Other Ambulatory Visit: Payer: Self-pay | Admitting: Family Medicine

## 2017-12-16 DIAGNOSIS — Z1231 Encounter for screening mammogram for malignant neoplasm of breast: Secondary | ICD-10-CM

## 2017-12-17 ENCOUNTER — Encounter: Payer: Self-pay | Admitting: Cardiovascular Disease

## 2017-12-17 ENCOUNTER — Ambulatory Visit (INDEPENDENT_AMBULATORY_CARE_PROVIDER_SITE_OTHER): Payer: Medicare Other | Admitting: Cardiovascular Disease

## 2017-12-17 VITALS — BP 122/66 | HR 65 | Ht 65.0 in | Wt 124.0 lb

## 2017-12-17 DIAGNOSIS — I6522 Occlusion and stenosis of left carotid artery: Secondary | ICD-10-CM | POA: Diagnosis not present

## 2017-12-17 DIAGNOSIS — E7800 Pure hypercholesterolemia, unspecified: Secondary | ICD-10-CM

## 2017-12-17 DIAGNOSIS — I10 Essential (primary) hypertension: Secondary | ICD-10-CM

## 2017-12-17 DIAGNOSIS — I2510 Atherosclerotic heart disease of native coronary artery without angina pectoris: Secondary | ICD-10-CM

## 2017-12-17 NOTE — Assessment & Plan Note (Signed)
History of essential hypertension her blood pressure measured at 122/66.  She is on amlodipine, clonidine, hydralazine, and losartan.  Continue current meds at current dosing.

## 2017-12-17 NOTE — Assessment & Plan Note (Signed)
History of moderate left ICA stenosis by duplex ultrasound measured 07/01/2017.  Will follow this on an annual basis.

## 2017-12-17 NOTE — Patient Instructions (Signed)
Your physician wants you to follow-up in: ONE YEAR WITH DR BERRY You will receive a reminder letter in the mail two months in advance. If you don't receive a letter, please call our office to schedule the follow-up appointment.   If you need a refill on your cardiac medications before your next appointment, please call your pharmacy.  

## 2017-12-17 NOTE — Assessment & Plan Note (Signed)
History of hyperlipidemia on statin therapy with lipid profile performed 11/10/2017 revealing total cholesterol 65, LDL 76 and HDL of 77.

## 2017-12-17 NOTE — Assessment & Plan Note (Signed)
History of CAD status post 2 overlapping stents in her LAD in [place] clinic in March evaluate.  She is been asymptomatic since denying chest pain or shortness of breath.  She did have a negative Myoview since her stent.

## 2017-12-17 NOTE — Progress Notes (Signed)
12/17/2017 Kendra [name]   Nov 15, 1935  [id number]  Primary Physician Vernie Shanks, MD Primary Cardiologist: Lorretta Harp MD Garret Reddish, [place], [place]  HPI:  Kendra [name] is a 82 y.o.  mildly overweight marriedfrom [place] to be [place] to be closer to her daughter. I last saw her in the office  09/22/2016.Marland KitchenShe self-referred to be established in our practice for ongoing cardiac care. She had previously seen Dr. Candee Furbish. I last saw her in the office 12/15/13. Her risk factors for heart disease are remarkable for treated hypertension, hyper-lipidemia and family history. Her mother did have 2 myocardial infarctions. She has never smoked. She had 2 overlapping stents placed in her LAD in [facility] clinic March 08 has been asymptomatic since. She had negative Myoview 2 years ago. She has had carotid Dopplers last year and again this year which show mild to moderate left internal carotid artery stenosis. Since I saw her last her only complaints are of nocturnal spikes of her systolic blood pressure up to the 190 range. I have reviewed her blood pressure log which reveals her blood pressure to be well-controlled on her current medications. Since I saw her a year ago she is remained stable.  She did spend 3 weeks in [place] walking long distances without limitation or symptoms.    Current Meds  Medication Sig  . albuterol (PROVENTIL HFA;VENTOLIN HFA) 108 (90 BASE) MCG/ACT inhaler Inhale 1-2 puffs into the lungs every 12 (twelve) hours as needed for wheezing or shortness of breath.   [name] amLODipine (NORVASC) 5 MG tablet Take 1.25 mg by mouth daily. 1/4 tablet daily  . beclomethasone (QVAR) 80 MCG/ACT inhaler Inhale 2 puffs into the lungs 2 (two) times daily.  [name] BIOTIN FORTE PO Take 1 capsule by mouth daily.  . calcium carbonate (OS-CAL - DOSED IN MG OF ELEMENTAL CALCIUM) 1250 (500 Ca) MG tablet Take 1 tablet by mouth daily.  . cholecalciferol (VITAMIN D) 1000 UNITS tablet Take 1,000  Units by mouth daily.  . clobetasol (OLUX) 0.05 % topical foam Apply 1 application topically every 3 (three) days.  . cloNIDine (CATAPRES) 0.1 MG tablet Take 0.1 mg by mouth daily as needed (high BP > 170).   . Coenzyme Q10 (CO Q-10) 100 MG CAPS Take 100 mg by mouth daily.   . Cranberry-Vitamin C-Probiotic (AZO CRANBERRY) 250-30 MG TABS Take 250 mg by mouth daily.  [name] ECHINACEA-ZINC-VITAMIN C PO Take 1 tablet by mouth daily.  . hydrALAZINE (APRESOLINE) 25 MG tablet Take 1 tablet (25 mg total) by mouth 2 (two) times daily. KEEP OV. (Patient taking differently: Take 12.5 mg by mouth 3 (three) times daily. KEEP OV.)  . hydrocortisone 2.5 % cream Apply 1 application topically 2 (two) times daily as needed (skin irritation). Reported on 12/24/2015  . losartan (COZAAR) 50 MG tablet Take 50 mg by mouth 2 (two) times daily.  . metaxalone (SKELAXIN) 800 MG tablet Take 400 mg by mouth 4 (four) times daily as needed for muscle spasms.   . Multiple Vitamin (MULTIVITAMIN) capsule Take 1 capsule by mouth daily.  . rosuvastatin (CRESTOR) 5 MG tablet Take 5 mg by mouth every other day. On Mondays, Wednesdays, and Saturdays     Allergies  Allergen Reactions  . Latex Other (See Comments)    Irritates her skin  . Statins     Unable to tolerate statins w the exception of crestor.  . Sulfa Antibiotics Other (See Comments)    "didnt feel good"  .  Sulfamethoxazole Other (See Comments)    Makes her feel bad  . Zetia [Ezetimibe] Other (See Comments)    "makes me constipated"  . Penicillins Rash    Social History   Socioeconomic History  . Marital status: Married    Spouse name: Not on file  . Number of children: 1  . Years of education: MAx2  . Highest education level: Not on file  Occupational History  . Occupation: Retired  . Occupation: Journalist, newspaper  . Financial resource strain: Not on file  . Food insecurity:    Worry: Not on file    Inability: Not on file  . Transportation  needs:    Medical: Not on file    Non-medical: Not on file  Tobacco Use  . Smoking status: Former Smoker    Last attempt to quit: 06/08/1966    Years since quitting: 51.5  . Smokeless tobacco: Never Used  Substance and Sexual Activity  . Alcohol use: Yes    Alcohol/week: 0.6 oz    Types: 1 drink(s) per week    Comment: wine  . Drug use: No  . Sexual activity: Not on file  Lifestyle  . Physical activity:    Days per week: Not on file    Minutes per session: Not on file  . Stress: Not on file  Relationships  . Social connections:    Talks on phone: Not on file    Gets together: Not on file    Attends religious service: Not on file    Active member of club or organization: Not on file    Attends meetings of clubs or organizations: Not on file    Relationship status: Not on file  . Intimate partner violence:    Fear of current or ex partner: Not on file    Emotionally abused: Not on file    Physically abused: Not on file    Forced sexual activity: Not on file  Other Topics Concern  . Not on file  Social History Narrative  . Not on file     Review of Systems: General: negative for chills, fever, night sweats or weight changes.  Cardiovascular: negative for chest pain, dyspnea on exertion, edema, orthopnea, palpitations, paroxysmal nocturnal dyspnea or shortness of breath Dermatological: negative for rash Respiratory: negative for cough or wheezing Urologic: negative for hematuria Abdominal: negative for nausea, vomiting, diarrhea, bright red blood per rectum, melena, or hematemesis Neurologic: negative for visual changes, syncope, or dizziness All other systems reviewed and are otherwise negative except as noted above.    Blood pressure 122/66, pulse 65, height 5\' 5"  (1.651 m), weight 124 lb (56.2 kg).  General appearance: alert and no distress Neck: no adenopathy, no carotid bruit, no JVD, supple, symmetrical, trachea midline and thyroid not enlarged, symmetric, no  tenderness/mass/nodules Lungs: clear to auscultation bilaterally Heart: regular rate and rhythm, S1, S2 normal, no murmur, click, rub or gallop Extremities: extremities normal, atraumatic, no cyanosis or edema Pulses: 2+ and symmetric Skin: Skin color, texture, turgor normal. No rashes or lesions Neurologic: Alert and oriented X 3, normal strength and tone. Normal symmetric reflexes. Normal coordination and gait  EKG sinus rhythm at 65 without ST or T wave changes.  I personally reviewed this EKG.  ASSESSMENT AND PLAN:   Coronary artery disease History of CAD status post 2 overlapping stents in her LAD in [place] clinic in March evaluate.  She is been asymptomatic since denying chest pain or shortness of breath.  She did have a negative Myoview since her stent.  Essential hypertension History of essential hypertension her blood pressure measured at 122/66.  She is on amlodipine, clonidine, hydralazine, and losartan.  Continue current meds at current dosing.  Hyperlipidemia History of hyperlipidemia on statin therapy with lipid profile performed 11/10/2017 revealing total cholesterol 65, LDL 76 and HDL of 77.  Carotid artery disease History of moderate left ICA stenosis by duplex ultrasound measured 07/01/2017.  Will follow this on an annual basis.      Lorretta Harp MD FACP,FACC,FAHA, [facility] 12/17/2017 2:23 PM

## 2018-02-01 ENCOUNTER — Ambulatory Visit
Admission: RE | Admit: 2018-02-01 | Discharge: 2018-02-01 | Disposition: A | Discharge status: Home / Self Care | Payer: Medicare Other | Source: Ambulatory Visit | Attending: Family Medicine | Admitting: Family Medicine

## 2018-02-01 ENCOUNTER — Ambulatory Visit: Payer: Medicare Other

## 2018-02-01 DIAGNOSIS — Z1231 Encounter for screening mammogram for malignant neoplasm of breast: Secondary | ICD-10-CM | POA: Diagnosis not present

## 2018-02-11 DIAGNOSIS — K5900 Constipation, unspecified: Secondary | ICD-10-CM | POA: Diagnosis not present

## 2018-02-11 DIAGNOSIS — R198 Other specified symptoms and signs involving the digestive system and abdomen: Secondary | ICD-10-CM | POA: Diagnosis not present

## 2018-03-15 DIAGNOSIS — Z23 Encounter for immunization: Secondary | ICD-10-CM | POA: Diagnosis not present

## 2018-04-07 DIAGNOSIS — H25093 Other age-related incipient cataract, bilateral: Secondary | ICD-10-CM | POA: Diagnosis not present

## 2018-04-07 DIAGNOSIS — H9193 Unspecified hearing loss, bilateral: Secondary | ICD-10-CM | POA: Diagnosis not present

## 2018-04-07 DIAGNOSIS — K5792 Diverticulitis of intestine, part unspecified, without perforation or abscess without bleeding: Secondary | ICD-10-CM | POA: Diagnosis not present

## 2018-04-07 DIAGNOSIS — L253 Unspecified contact dermatitis due to other chemical products: Secondary | ICD-10-CM | POA: Diagnosis not present

## 2018-04-07 DIAGNOSIS — I10 Essential (primary) hypertension: Secondary | ICD-10-CM | POA: Diagnosis not present

## 2018-04-07 DIAGNOSIS — G459 Transient cerebral ischemic attack, unspecified: Secondary | ICD-10-CM | POA: Diagnosis not present

## 2018-04-07 DIAGNOSIS — J4541 Moderate persistent asthma with (acute) exacerbation: Secondary | ICD-10-CM | POA: Diagnosis not present

## 2018-04-07 DIAGNOSIS — E782 Mixed hyperlipidemia: Secondary | ICD-10-CM | POA: Diagnosis not present

## 2018-04-07 DIAGNOSIS — I2510 Atherosclerotic heart disease of native coronary artery without angina pectoris: Secondary | ICD-10-CM | POA: Diagnosis not present

## 2018-04-07 DIAGNOSIS — R739 Hyperglycemia, unspecified: Secondary | ICD-10-CM | POA: Diagnosis not present

## 2018-04-07 DIAGNOSIS — K5900 Constipation, unspecified: Secondary | ICD-10-CM | POA: Diagnosis not present

## 2018-04-21 DIAGNOSIS — K5900 Constipation, unspecified: Secondary | ICD-10-CM | POA: Diagnosis not present

## 2018-04-25 DIAGNOSIS — J069 Acute upper respiratory infection, unspecified: Secondary | ICD-10-CM | POA: Diagnosis not present

## 2018-05-10 DIAGNOSIS — H25042 Posterior subcapsular polar age-related cataract, left eye: Secondary | ICD-10-CM | POA: Diagnosis not present

## 2018-05-10 DIAGNOSIS — H2513 Age-related nuclear cataract, bilateral: Secondary | ICD-10-CM | POA: Diagnosis not present

## 2018-05-10 DIAGNOSIS — H40013 Open angle with borderline findings, low risk, bilateral: Secondary | ICD-10-CM | POA: Diagnosis not present

## 2018-05-10 DIAGNOSIS — H25013 Cortical age-related cataract, bilateral: Secondary | ICD-10-CM | POA: Diagnosis not present

## 2018-06-10 DIAGNOSIS — H40013 Open angle with borderline findings, low risk, bilateral: Secondary | ICD-10-CM | POA: Diagnosis not present

## 2018-06-10 DIAGNOSIS — H25042 Posterior subcapsular polar age-related cataract, left eye: Secondary | ICD-10-CM | POA: Diagnosis not present

## 2018-06-10 DIAGNOSIS — H25013 Cortical age-related cataract, bilateral: Secondary | ICD-10-CM | POA: Diagnosis not present

## 2018-06-10 DIAGNOSIS — H2511 Age-related nuclear cataract, right eye: Secondary | ICD-10-CM | POA: Diagnosis not present

## 2018-06-10 DIAGNOSIS — H2513 Age-related nuclear cataract, bilateral: Secondary | ICD-10-CM | POA: Diagnosis not present

## 2018-06-10 DIAGNOSIS — H35363 Drusen (degenerative) of macula, bilateral: Secondary | ICD-10-CM | POA: Diagnosis not present

## 2018-06-13 DIAGNOSIS — M545 Low back pain: Secondary | ICD-10-CM | POA: Diagnosis not present

## 2018-06-17 ENCOUNTER — Ambulatory Visit (HOSPITAL_COMMUNITY)
Admission: RE | Admit: 2018-06-17 | Discharge: 2018-06-17 | Disposition: A | Discharge status: Home / Self Care | Payer: Medicare Other | Source: Ambulatory Visit | Attending: Cardiovascular Disease | Admitting: Cardiovascular Disease

## 2018-06-17 DIAGNOSIS — I6522 Occlusion and stenosis of left carotid artery: Secondary | ICD-10-CM | POA: Diagnosis not present

## 2018-06-20 ENCOUNTER — Other Ambulatory Visit: Payer: Self-pay | Admitting: *Deleted

## 2018-06-20 ENCOUNTER — Telehealth: Payer: Self-pay

## 2018-06-20 DIAGNOSIS — I6522 Occlusion and stenosis of left carotid artery: Secondary | ICD-10-CM

## 2018-06-20 NOTE — Telephone Encounter (Signed)
Per DPR; left detailed message of results on voicemail

## 2018-07-04 ENCOUNTER — Inpatient Hospital Stay (HOSPITAL_COMMUNITY): Admission: RE | Admit: 2018-07-04 | Payer: Medicare Other | Source: Ambulatory Visit

## 2018-07-06 DIAGNOSIS — H2511 Age-related nuclear cataract, right eye: Secondary | ICD-10-CM | POA: Diagnosis not present

## 2018-07-08 ENCOUNTER — Encounter (HOSPITAL_COMMUNITY): Payer: Medicare Other

## 2018-07-11 DIAGNOSIS — H2512 Age-related nuclear cataract, left eye: Secondary | ICD-10-CM | POA: Diagnosis not present

## 2018-07-20 DIAGNOSIS — H52222 Regular astigmatism, left eye: Secondary | ICD-10-CM | POA: Diagnosis not present

## 2018-07-20 DIAGNOSIS — H25812 Combined forms of age-related cataract, left eye: Secondary | ICD-10-CM | POA: Diagnosis not present

## 2018-07-20 DIAGNOSIS — H2512 Age-related nuclear cataract, left eye: Secondary | ICD-10-CM | POA: Diagnosis not present

## 2018-08-02 DIAGNOSIS — J069 Acute upper respiratory infection, unspecified: Secondary | ICD-10-CM | POA: Diagnosis not present

## 2018-08-09 DIAGNOSIS — R739 Hyperglycemia, unspecified: Secondary | ICD-10-CM | POA: Diagnosis not present

## 2018-08-09 DIAGNOSIS — I6522 Occlusion and stenosis of left carotid artery: Secondary | ICD-10-CM | POA: Diagnosis not present

## 2018-08-09 DIAGNOSIS — L989 Disorder of the skin and subcutaneous tissue, unspecified: Secondary | ICD-10-CM | POA: Diagnosis not present

## 2018-08-09 DIAGNOSIS — I2510 Atherosclerotic heart disease of native coronary artery without angina pectoris: Secondary | ICD-10-CM | POA: Diagnosis not present

## 2018-08-09 DIAGNOSIS — B353 Tinea pedis: Secondary | ICD-10-CM | POA: Diagnosis not present

## 2018-08-12 DIAGNOSIS — L989 Disorder of the skin and subcutaneous tissue, unspecified: Secondary | ICD-10-CM | POA: Diagnosis not present

## 2018-08-12 DIAGNOSIS — B353 Tinea pedis: Secondary | ICD-10-CM | POA: Diagnosis not present

## 2018-08-12 DIAGNOSIS — I2510 Atherosclerotic heart disease of native coronary artery without angina pectoris: Secondary | ICD-10-CM | POA: Diagnosis not present

## 2018-08-12 DIAGNOSIS — R739 Hyperglycemia, unspecified: Secondary | ICD-10-CM | POA: Diagnosis not present

## 2018-08-12 DIAGNOSIS — I6522 Occlusion and stenosis of left carotid artery: Secondary | ICD-10-CM | POA: Diagnosis not present

## 2018-09-26 DIAGNOSIS — M1710 Unilateral primary osteoarthritis, unspecified knee: Secondary | ICD-10-CM | POA: Diagnosis not present

## 2018-09-26 DIAGNOSIS — H25093 Other age-related incipient cataract, bilateral: Secondary | ICD-10-CM | POA: Diagnosis not present

## 2018-09-26 DIAGNOSIS — E782 Mixed hyperlipidemia: Secondary | ICD-10-CM | POA: Diagnosis not present

## 2018-09-26 DIAGNOSIS — J4541 Moderate persistent asthma with (acute) exacerbation: Secondary | ICD-10-CM | POA: Diagnosis not present

## 2018-09-26 DIAGNOSIS — I2510 Atherosclerotic heart disease of native coronary artery without angina pectoris: Secondary | ICD-10-CM | POA: Diagnosis not present

## 2018-09-26 DIAGNOSIS — M1990 Unspecified osteoarthritis, unspecified site: Secondary | ICD-10-CM | POA: Diagnosis not present

## 2018-09-26 DIAGNOSIS — I10 Essential (primary) hypertension: Secondary | ICD-10-CM | POA: Diagnosis not present

## 2018-09-26 DIAGNOSIS — G459 Transient cerebral ischemic attack, unspecified: Secondary | ICD-10-CM | POA: Diagnosis not present

## 2018-10-07 DIAGNOSIS — S81811A Laceration without foreign body, right lower leg, initial encounter: Secondary | ICD-10-CM | POA: Diagnosis not present

## 2018-10-12 DIAGNOSIS — M1990 Unspecified osteoarthritis, unspecified site: Secondary | ICD-10-CM | POA: Diagnosis not present

## 2018-10-12 DIAGNOSIS — M1710 Unilateral primary osteoarthritis, unspecified knee: Secondary | ICD-10-CM | POA: Diagnosis not present

## 2018-10-12 DIAGNOSIS — J4541 Moderate persistent asthma with (acute) exacerbation: Secondary | ICD-10-CM | POA: Diagnosis not present

## 2018-10-12 DIAGNOSIS — E782 Mixed hyperlipidemia: Secondary | ICD-10-CM | POA: Diagnosis not present

## 2018-10-12 DIAGNOSIS — G459 Transient cerebral ischemic attack, unspecified: Secondary | ICD-10-CM | POA: Diagnosis not present

## 2018-10-12 DIAGNOSIS — H25093 Other age-related incipient cataract, bilateral: Secondary | ICD-10-CM | POA: Diagnosis not present

## 2018-10-12 DIAGNOSIS — I10 Essential (primary) hypertension: Secondary | ICD-10-CM | POA: Diagnosis not present

## 2018-10-12 DIAGNOSIS — I2510 Atherosclerotic heart disease of native coronary artery without angina pectoris: Secondary | ICD-10-CM | POA: Diagnosis not present

## 2018-10-14 DIAGNOSIS — L0390 Cellulitis, unspecified: Secondary | ICD-10-CM | POA: Diagnosis not present

## 2018-11-04 DIAGNOSIS — S81811D Laceration without foreign body, right lower leg, subsequent encounter: Secondary | ICD-10-CM | POA: Diagnosis not present

## 2018-11-08 ENCOUNTER — Encounter (HOSPITAL_COMMUNITY): Payer: Self-pay

## 2018-11-08 ENCOUNTER — Ambulatory Visit (HOSPITAL_COMMUNITY)
Admission: EM | Admit: 2018-11-08 | Discharge: 2018-11-08 | Disposition: A | Discharge status: Home / Self Care | Payer: Medicare Other | Attending: Family Medicine | Admitting: Family Medicine

## 2018-11-08 ENCOUNTER — Other Ambulatory Visit: Payer: Self-pay

## 2018-11-08 DIAGNOSIS — M79604 Pain in right leg: Secondary | ICD-10-CM | POA: Diagnosis not present

## 2018-11-08 MED ORDER — CEPHALEXIN 500 MG PO CAPS: 500.0000 mg | ORAL_CAPSULE | Freq: Two times a day (BID) | ORAL | 0 refills | Status: AC

## 2018-11-08 NOTE — Discharge Instructions (Signed)
We will try another round of antibiotics. I believe you could benefit from seeing a wound specialist especially if the problem persists. Like we discussed this could be inflammatory versus infection Follow up as needed for continued or worsening symptoms

## 2018-11-08 NOTE — ED Triage Notes (Signed)
Patient presents to Urgent Care with complaints of right leg wound on RLE, anteriorly since about a month ago. Patient reports she has had her PCP look at it and has taken two antibiotics for it, most recently doxycycline. Pt's skin is very thin, wound has significant redness around and below it, swelling noted around wound and below wound as well.

## 2018-11-09 NOTE — ED Provider Notes (Signed)
[facility]    CSN: [id number] Arrival date & time: 11/08/18  1351     History   Chief Complaint Chief Complaint  Patient presents with  . Appointment  . Wound Infection    HPI Kendra [name] is a 83 y.o. female.   Patient is a 83 year old female with past medical history of asthma, CAD, the JD, hypertension, hyperlipidemia.  She presents today with right leg wound, anterior that has been there for approximately 1 month.  This started after hitting her lower extremity on a bathtub.  The area became infected and she was treated with Bactroban.  Reports this made the problem worse.  She was then seen again and treated with doxycycline which she reports improved the infection and redness.  Since about a week ago she noticed the redness was spreading again.  Denies any significant pain to the lower extremity.  She does have some swelling but this is normal for her.  No calf pain or tenderness.  Very thin skin.  No fevers, chills, body aches or night sweats.  ROS per HPI      Past Medical History:  Diagnosis Date  . Asthma   . CAD (coronary artery disease)    last cath 08/2006  . Carotid artery disease (HCC)    moderate left ICA stenosis by 2.6 ultrasound  . DJD (degenerative joint disease), lumbar   . Hearing loss   . HTN (hypertension)   . Hyperlipidemia   . Vision loss of right eye     Patient Active Problem List   Diagnosis Date Noted  . Carotid artery disease ([place]) 09/19/2014  . Disturbance of skin sensation 11/17/2013  . Coronary artery disease 03/31/2013  . Essential hypertension 03/31/2013  . Hyperlipidemia 03/31/2013    Past Surgical History:  Procedure Laterality Date  . ABDOMINAL HYSTERECTOMY  1975  . APPENDECTOMY    . CORONARY ANGIOPLASTY WITH STENT PLACEMENT  08/2006   taxus stent x2 to LAD, 2.5x52mm and 2.5x62mm; done at [facility]  . RENAL ANGIOGRAM  [facility] in [place]  . TONSILLECTOMY  1994    OB History    No obstetric history on file.      Home Medications    Prior to Admission medications   Medication Sig Start Date End Date Taking? Authorizing Provider  albuterol (PROVENTIL HFA;VENTOLIN HFA) 108 (90 BASE) MCG/ACT inhaler Inhale 1-2 puffs into the lungs every 12 (twelve) hours as needed for wheezing or shortness of breath.     [provider]  amLODipine (NORVASC) 5 MG tablet Take 1.25 mg by mouth daily. 1/4 tablet daily    [provider]  beclomethasone (QVAR) 80 MCG/ACT inhaler Inhale 2 puffs into the lungs 2 (two) times daily.    [provider]  BIOTIN FORTE PO Take 1 capsule by mouth daily.    [provider]  calcium carbonate (OS-CAL - DOSED IN MG OF ELEMENTAL CALCIUM) 1250 (500 Ca) MG tablet Take 1 tablet by mouth daily.    [provider]  cephALEXin (KEFLEX) 500 MG capsule Take 1 capsule (500 mg total) by mouth 2 (two) times daily for 7 days. 11/08/18 11/15/18  Loura Halt A, NP  cholecalciferol (VITAMIN D) 1000 UNITS tablet Take 1,000 Units by mouth daily.    [provider]  clobetasol (OLUX) 0.05 % topical foam Apply 1 application topically every 3 (three) days.    [provider]  cloNIDine (CATAPRES) 0.1 MG tablet Take 0.1 mg  by mouth daily as needed (high BP > 170).     [provider]  Coenzyme Q10 (CO Q-10) 100 MG CAPS Take 100 mg by mouth daily.     [provider]  Cranberry-Vitamin C-Probiotic (AZO CRANBERRY) 250-30 MG TABS Take 250 mg by mouth daily.    [provider]  ECHINACEA-ZINC-VITAMIN C PO Take 1 tablet by mouth daily.    [provider]  hydrALAZINE (APRESOLINE) 25 MG tablet Take 1 tablet (25 mg total) by mouth 2 (two) times daily. KEEP OV. Patient taking differently: Take 12.5 mg by mouth 3 (three) times daily. KEEP OV. 03/26/16   Lorretta Harp, MD  hydrocortisone 2.5 % cream Apply 1 application topically 2 (two) times daily as needed (skin irritation). Reported  on 12/24/2015    [provider]  losartan (COZAAR) 50 MG tablet Take 50 mg by mouth 2 (two) times daily.    [provider]  metaxalone (SKELAXIN) 800 MG tablet Take 400 mg by mouth 4 (four) times daily as needed for muscle spasms.     [provider]  Multiple Vitamin (MULTIVITAMIN) capsule Take 1 capsule by mouth daily.    [provider]  rosuvastatin (CRESTOR) 5 MG tablet Take 5 mg by mouth every other day. On Mondays, Wednesdays, and Saturdays    [provider]    Family History Family History  Problem Relation Age of Onset  . Heart attack Mother        x3; age 35  . Cancer Mother        colon  . Heart attack Father   . Diabetes Father   . Cancer Maternal Grandmother        breast  . Heart attack Paternal Grandfather   . Hyperlipidemia Sister     Social History Social History   Tobacco Use  . Smoking status: Former Smoker    Last attempt to quit: 06/08/1966    Years since quitting: 52.4  . Smokeless tobacco: Never Used  Substance Use Topics  . Alcohol use: Yes    Alcohol/week: 1.0 standard drinks    Types: 1 drink(s) per week    Comment: wine  . Drug use: No     Allergies   Latex; Statins; Sulfa antibiotics; Sulfamethoxazole; Zetia [ezetimibe]; and Penicillins   Review of Systems Review of Systems   Physical Exam Triage Vital Signs ED Triage Vitals  Enc Vitals Group     BP 11/08/18 1416 (!) 149/75     Pulse Rate 11/08/18 1416 64     Resp 11/08/18 1416 17     Temp 11/08/18 1416 97.8 F (36.6 C)     Temp Source 11/08/18 1416 Oral     SpO2 11/08/18 1416 100 %     Weight --      Height --      Head Circumference --      Peak Flow --      Pain Score 11/08/18 1414 3     Pain Loc --      Pain Edu? --      Excl. in [place]? --    No data found.  Updated Vital Signs BP (!) 149/75 (BP Location: Left Arm)   Pulse 64   Temp 97.8 F (36.6 C) (Oral)   Resp 17   SpO2 100%   Visual Acuity Right Eye Distance:    Left Eye Distance:   Bilateral Distance:    Right Eye Near:   Left Eye Near:  Bilateral Near:     Physical Exam Vitals signs and nursing note reviewed.  Constitutional:      General: She is not in acute distress.    Appearance: She is well-developed.  HENT:     Head: Normocephalic and atraumatic.  Eyes:     Conjunctiva/sclera: Conjunctivae normal.  Neck:     Musculoskeletal: Neck supple.  Cardiovascular:     Rate and Rhythm: Normal rate and regular rhythm.     Heart sounds: No murmur.  Pulmonary:     Effort: Pulmonary effort is normal. No respiratory distress.     Breath sounds: Normal breath sounds.  Abdominal:     Palpations: Abdomen is soft.     Tenderness: There is no abdominal tenderness.  Musculoskeletal: Normal range of motion.     Right lower leg: Edema present.     Left lower leg: Edema present.     Comments: 1+ pitting edema bilateral  Skin:    General: Skin is warm and dry.  Neurological:     Mental Status: She is alert.  Psychiatric:        Mood and Affect: Mood normal.          UC Treatments / Results  Labs (all labs ordered are listed, but only abnormal results are displayed) Labs Reviewed - No data to display  EKG None  Radiology No results found.  Procedures Procedures (including critical care time)  Medications Ordered in UC Medications - No data to display  Initial Impression / Assessment and Plan / UC Course  I have reviewed the triage vital signs and the nursing notes.  Pertinent labs & imaging results that were available during my care of the patient were reviewed by me and considered in my medical decision making (see chart for details).    Leg pain  Infectious process versus inflammatory process. Patient requesting antibiotics and very adamant. I believe patient needs to be seen at the wound clinic due to delayed healing.  Contact put on her discharge instructions for wound clinic We will trial a prescription of Keflex  to see if this helps her symptoms Otherwise instructed to wear her TED hose for swelling and elevate the legs. She will need to follow-up with her doctor for any continued or worsening problems Final Clinical Impressions(s) / UC Diagnoses   Final diagnoses:  Leg pain, anterior, right     Discharge Instructions     We will try another round of antibiotics. I believe you could benefit from seeing a wound specialist especially if the problem persists. Like we discussed this could be inflammatory versus infection Follow up as needed for continued or worsening symptoms     ED Prescriptions    Medication Sig Dispense Auth. Provider   cephALEXin (KEFLEX) 500 MG capsule Take 1 capsule (500 mg total) by mouth 2 (two) times daily for 7 days. 14 capsule Loura Halt A, NP     Controlled Substance Prescriptions [place] Controlled Substance Registry consulted? Not Applicable   Orvan July, NP 11/09/18 0800

## 2018-11-14 ENCOUNTER — Encounter (HOSPITAL_BASED_OUTPATIENT_CLINIC_OR_DEPARTMENT_OTHER): Payer: Medicare Other | Attending: Internal Medicine

## 2018-11-14 DIAGNOSIS — L97812 Non-pressure chronic ulcer of other part of right lower leg with fat layer exposed: Secondary | ICD-10-CM | POA: Diagnosis not present

## 2018-11-14 DIAGNOSIS — Z87891 Personal history of nicotine dependence: Secondary | ICD-10-CM | POA: Diagnosis not present

## 2018-11-14 DIAGNOSIS — S81802A Unspecified open wound, left lower leg, initial encounter: Secondary | ICD-10-CM | POA: Diagnosis not present

## 2018-11-14 DIAGNOSIS — I252 Old myocardial infarction: Secondary | ICD-10-CM | POA: Insufficient documentation

## 2018-11-14 DIAGNOSIS — I10 Essential (primary) hypertension: Secondary | ICD-10-CM | POA: Insufficient documentation

## 2018-11-14 DIAGNOSIS — I2510 Atherosclerotic heart disease of native coronary artery without angina pectoris: Secondary | ICD-10-CM | POA: Insufficient documentation

## 2018-11-21 DIAGNOSIS — S81801A Unspecified open wound, right lower leg, initial encounter: Secondary | ICD-10-CM | POA: Diagnosis not present

## 2018-11-21 DIAGNOSIS — I2510 Atherosclerotic heart disease of native coronary artery without angina pectoris: Secondary | ICD-10-CM | POA: Diagnosis not present

## 2018-11-21 DIAGNOSIS — L97812 Non-pressure chronic ulcer of other part of right lower leg with fat layer exposed: Secondary | ICD-10-CM | POA: Diagnosis not present

## 2018-11-21 DIAGNOSIS — Z87891 Personal history of nicotine dependence: Secondary | ICD-10-CM | POA: Diagnosis not present

## 2018-11-21 DIAGNOSIS — I10 Essential (primary) hypertension: Secondary | ICD-10-CM | POA: Diagnosis not present

## 2018-11-21 DIAGNOSIS — I252 Old myocardial infarction: Secondary | ICD-10-CM | POA: Diagnosis not present

## 2018-11-25 DIAGNOSIS — I252 Old myocardial infarction: Secondary | ICD-10-CM | POA: Diagnosis not present

## 2018-11-25 DIAGNOSIS — S81801A Unspecified open wound, right lower leg, initial encounter: Secondary | ICD-10-CM | POA: Diagnosis not present

## 2018-11-25 DIAGNOSIS — Z87891 Personal history of nicotine dependence: Secondary | ICD-10-CM | POA: Diagnosis not present

## 2018-11-25 DIAGNOSIS — L97812 Non-pressure chronic ulcer of other part of right lower leg with fat layer exposed: Secondary | ICD-10-CM | POA: Diagnosis not present

## 2018-11-25 DIAGNOSIS — I10 Essential (primary) hypertension: Secondary | ICD-10-CM | POA: Diagnosis not present

## 2018-11-25 DIAGNOSIS — I2510 Atherosclerotic heart disease of native coronary artery without angina pectoris: Secondary | ICD-10-CM | POA: Diagnosis not present

## 2018-12-02 DIAGNOSIS — I2510 Atherosclerotic heart disease of native coronary artery without angina pectoris: Secondary | ICD-10-CM | POA: Diagnosis not present

## 2018-12-02 DIAGNOSIS — I10 Essential (primary) hypertension: Secondary | ICD-10-CM | POA: Diagnosis not present

## 2018-12-02 DIAGNOSIS — Z87891 Personal history of nicotine dependence: Secondary | ICD-10-CM | POA: Diagnosis not present

## 2018-12-02 DIAGNOSIS — L97812 Non-pressure chronic ulcer of other part of right lower leg with fat layer exposed: Secondary | ICD-10-CM | POA: Diagnosis not present

## 2018-12-02 DIAGNOSIS — S81801A Unspecified open wound, right lower leg, initial encounter: Secondary | ICD-10-CM | POA: Diagnosis not present

## 2018-12-02 DIAGNOSIS — I252 Old myocardial infarction: Secondary | ICD-10-CM | POA: Diagnosis not present

## 2018-12-08 ENCOUNTER — Encounter (HOSPITAL_BASED_OUTPATIENT_CLINIC_OR_DEPARTMENT_OTHER): Payer: Medicare Other | Attending: Internal Medicine

## 2018-12-08 DIAGNOSIS — Z09 Encounter for follow-up examination after completed treatment for conditions other than malignant neoplasm: Secondary | ICD-10-CM | POA: Insufficient documentation

## 2018-12-08 DIAGNOSIS — Z872 Personal history of diseases of the skin and subcutaneous tissue: Secondary | ICD-10-CM | POA: Diagnosis not present

## 2018-12-13 DIAGNOSIS — M1710 Unilateral primary osteoarthritis, unspecified knee: Secondary | ICD-10-CM | POA: Diagnosis not present

## 2018-12-13 DIAGNOSIS — J4541 Moderate persistent asthma with (acute) exacerbation: Secondary | ICD-10-CM | POA: Diagnosis not present

## 2018-12-13 DIAGNOSIS — R7303 Prediabetes: Secondary | ICD-10-CM | POA: Diagnosis not present

## 2018-12-13 DIAGNOSIS — I10 Essential (primary) hypertension: Secondary | ICD-10-CM | POA: Diagnosis not present

## 2018-12-13 DIAGNOSIS — G459 Transient cerebral ischemic attack, unspecified: Secondary | ICD-10-CM | POA: Diagnosis not present

## 2018-12-13 DIAGNOSIS — M1990 Unspecified osteoarthritis, unspecified site: Secondary | ICD-10-CM | POA: Diagnosis not present

## 2018-12-13 DIAGNOSIS — I2510 Atherosclerotic heart disease of native coronary artery without angina pectoris: Secondary | ICD-10-CM | POA: Diagnosis not present

## 2018-12-13 DIAGNOSIS — M722 Plantar fascial fibromatosis: Secondary | ICD-10-CM | POA: Diagnosis not present

## 2018-12-13 DIAGNOSIS — E782 Mixed hyperlipidemia: Secondary | ICD-10-CM | POA: Diagnosis not present

## 2018-12-15 DIAGNOSIS — H903 Sensorineural hearing loss, bilateral: Secondary | ICD-10-CM | POA: Diagnosis not present

## 2018-12-16 DIAGNOSIS — Z09 Encounter for follow-up examination after completed treatment for conditions other than malignant neoplasm: Secondary | ICD-10-CM | POA: Diagnosis not present

## 2018-12-16 DIAGNOSIS — Z872 Personal history of diseases of the skin and subcutaneous tissue: Secondary | ICD-10-CM | POA: Diagnosis not present

## 2018-12-16 DIAGNOSIS — S81801A Unspecified open wound, right lower leg, initial encounter: Secondary | ICD-10-CM | POA: Diagnosis not present

## 2018-12-19 ENCOUNTER — Other Ambulatory Visit: Payer: Self-pay | Admitting: Family Medicine

## 2018-12-19 DIAGNOSIS — Z1231 Encounter for screening mammogram for malignant neoplasm of breast: Secondary | ICD-10-CM

## 2018-12-29 ENCOUNTER — Ambulatory Visit (INDEPENDENT_AMBULATORY_CARE_PROVIDER_SITE_OTHER): Payer: Medicare Other | Admitting: Podiatry

## 2018-12-29 ENCOUNTER — Other Ambulatory Visit: Payer: Self-pay

## 2018-12-29 ENCOUNTER — Ambulatory Visit (INDEPENDENT_AMBULATORY_CARE_PROVIDER_SITE_OTHER): Payer: Medicare Other

## 2018-12-29 ENCOUNTER — Encounter: Payer: Self-pay | Admitting: Podiatry

## 2018-12-29 DIAGNOSIS — M722 Plantar fascial fibromatosis: Secondary | ICD-10-CM

## 2018-12-29 DIAGNOSIS — M7989 Other specified soft tissue disorders: Secondary | ICD-10-CM | POA: Diagnosis not present

## 2018-12-29 DIAGNOSIS — I6522 Occlusion and stenosis of left carotid artery: Secondary | ICD-10-CM | POA: Diagnosis not present

## 2019-01-02 DIAGNOSIS — G459 Transient cerebral ischemic attack, unspecified: Secondary | ICD-10-CM | POA: Diagnosis not present

## 2019-01-02 DIAGNOSIS — M1710 Unilateral primary osteoarthritis, unspecified knee: Secondary | ICD-10-CM | POA: Diagnosis not present

## 2019-01-02 DIAGNOSIS — E782 Mixed hyperlipidemia: Secondary | ICD-10-CM | POA: Diagnosis not present

## 2019-01-02 DIAGNOSIS — M1990 Unspecified osteoarthritis, unspecified site: Secondary | ICD-10-CM | POA: Diagnosis not present

## 2019-01-02 DIAGNOSIS — R7303 Prediabetes: Secondary | ICD-10-CM | POA: Diagnosis not present

## 2019-01-02 DIAGNOSIS — I2510 Atherosclerotic heart disease of native coronary artery without angina pectoris: Secondary | ICD-10-CM | POA: Diagnosis not present

## 2019-01-02 DIAGNOSIS — I10 Essential (primary) hypertension: Secondary | ICD-10-CM | POA: Diagnosis not present

## 2019-01-02 DIAGNOSIS — J4541 Moderate persistent asthma with (acute) exacerbation: Secondary | ICD-10-CM | POA: Diagnosis not present

## 2019-01-03 ENCOUNTER — Telehealth: Payer: Self-pay | Admitting: Podiatry

## 2019-01-03 DIAGNOSIS — M79672 Pain in left foot: Secondary | ICD-10-CM

## 2019-01-03 NOTE — Telephone Encounter (Signed)
Pt cut off soft cast because she was having swelling above the calf, also it wasn't helping the toes. She also was having trouble walking. She only had it on for two and a half days. Pt was suppose to call and let Dr. Jacqualyn Posey know.

## 2019-01-03 NOTE — Telephone Encounter (Signed)
I called Kendra [name] and asked if she had decreased her activity while in the soft cast. Kendra [name] states she did not, she has been walking a mile in the evening when it cools enough. I told Kendra [name] that the soft cast was only part of the therapy for the swelling, rest and elevation were also geared to decreasing the swelling as well as the medication and compression of the soft cast. Kendra [name] states the 3rd reason she removed the cast was it hurt her foot. I told Kendra [name] that too much activity may have caused the cast to be uncomfortable, and I would inform Dr. Jacqualyn Posey and call again with more information.

## 2019-01-04 NOTE — Progress Notes (Signed)
Subjective:   Patient ID: Kendra [name], female   DOB: 83 y.o.   MRN: [id number]   HPI 83 year old female presents the office today for concerns of pain mostly to the top of her right foot.  She states that it hurts also the arch of her foot and she feels pulling sensation.  She states that wearing inserts does help.  She has noted some swelling to the area.  She does swelling to both of her ankles recently to the left side on the ankle has been more significant over the last month.  She denies any calf pain.  No recent injury.  She was using topical CBD oil.  Also she states her toenails bilateral she gets pedicures.  No pain in the nails.  Review of Systems  All other systems reviewed and are negative.  Past Medical History:  Diagnosis Date  . Asthma   . CAD (coronary artery disease)    last cath 08/2006  . Carotid artery disease (HCC)    moderate left ICA stenosis by 2.6 ultrasound  . DJD (degenerative joint disease), lumbar   . Hearing loss   . HTN (hypertension)   . Hyperlipidemia   . Vision loss of right eye     Past Surgical History:  Procedure Laterality Date  . ABDOMINAL HYSTERECTOMY  1975  . APPENDECTOMY    . CORONARY ANGIOPLASTY WITH STENT PLACEMENT  08/2006   taxus stent x2 to LAD, 2.5x23mm and 2.5x60mm; done at [facility]  . RENAL ANGIOGRAM  [facility] in [place]  . TONSILLECTOMY  1994     Current Outpatient Medications:  .  albuterol (PROVENTIL HFA;VENTOLIN HFA) 108 (90 BASE) MCG/ACT inhaler, Inhale 1-2 puffs into the lungs every 12 (twelve) hours as needed for wheezing or shortness of breath. , Disp: , Rfl:  .  amLODipine (NORVASC) 5 MG tablet, Take 1.25 mg by mouth daily. 1/4 tablet daily, Disp: , Rfl:  .  beclomethasone (QVAR) 80 MCG/ACT inhaler, Inhale 2 puffs into the lungs 2 (two) times daily., Disp: , Rfl:  .  BIOTIN FORTE PO, Take 1 capsule by mouth daily., Disp: , Rfl:  .  calcium carbonate (OS-CAL - DOSED IN MG OF ELEMENTAL  CALCIUM) 1250 (500 Ca) MG tablet, Take 1 tablet by mouth daily., Disp: , Rfl:  .  cholecalciferol (VITAMIN D) 1000 UNITS tablet, Take 1,000 Units by mouth daily., Disp: , Rfl:  .  clobetasol (OLUX) 0.05 % topical foam, Apply 1 application topically every 3 (three) days., Disp: , Rfl:  .  cloNIDine (CATAPRES) 0.1 MG tablet, Take 0.1 mg by mouth daily as needed (high BP > 170). , Disp: , Rfl:  .  Coenzyme Q10 (CO Q-10) 100 MG CAPS, Take 100 mg by mouth daily. , Disp: , Rfl:  .  Cranberry-Vitamin C-Probiotic (AZO CRANBERRY) 250-30 MG TABS, Take 250 mg by mouth daily., Disp: , Rfl:  .  doxycycline (VIBRA-TABS) 100 MG tablet, , Disp: , Rfl:  .  ECHINACEA-ZINC-VITAMIN C PO, Take 1 tablet by mouth daily., Disp: , Rfl:  .  glucosamine-chondroitin 500-400 MG tablet, Take by mouth., Disp: , Rfl:  .  hydrALAZINE (APRESOLINE) 25 MG tablet, Take 1 tablet (25 mg total) by mouth 2 (two) times daily. KEEP OV. (Patient taking differently: Take 12.5 mg by mouth 3 (three) times daily. KEEP OV.), Disp: 180 tablet, Rfl: 0 .  hydrocortisone 2.5 % cream, Apply 1 application topically 2 (two) times daily as needed (skin irritation). Reported on  12/24/2015, Disp: , Rfl:  .  ketoconazole (NIZORAL) 2 % cream, , Disp: , Rfl:  .  losartan (COZAAR) 50 MG tablet, Take 50 mg by mouth 2 (two) times daily., Disp: , Rfl:  .  metaxalone (SKELAXIN) 800 MG tablet, Take 400 mg by mouth 4 (four) times daily as needed for muscle spasms. , Disp: , Rfl:  .  Multiple Vitamin (MULTIVITAMIN) capsule, Take 1 capsule by mouth daily., Disp: , Rfl:  .  Multiple Vitamins-Minerals (MULTIVITAMIN WITH MINERALS) tablet, Take by mouth., Disp: , Rfl:  .  mupirocin ointment (BACTROBAN) 2 %, , Disp: , Rfl:  .  rosuvastatin (CRESTOR) 5 MG tablet, Take 5 mg by mouth every other day. On Mondays, Wednesdays, and Saturdays, Disp: , Rfl:   Allergies  Allergen Reactions  . Latex Other (See Comments)    Irritates her skin  . Statins     Unable to tolerate  statins w the exception of crestor.  . Sulfa Antibiotics Other (See Comments)    "didnt feel good"  . Sulfamethoxazole Other (See Comments)    Makes her feel bad  . Zetia [Ezetimibe] Other (See Comments)    "makes me constipated"  . Penicillins Rash          Objective:  Physical Exam  General: AAO x3, NAD  Dermatological: Skin is warm, dry and supple bilateral. Nails x 10 are well manicured; remaining integument appears unremarkable at this time. There are no open sores, no preulcerative lesions, no rash or signs of infection present.  Vascular: Dorsalis Pedis artery and Posterior Tibial artery pedal pulses are 2/4 bilateral with immedate capillary fill time. Pedal hair growth present.  Mild pitting edema present bilaterally.  Mild swelling to the left foot and ankle no erythema or warmth.  Neruologic: Grossly intact via light touch bilateral. Vibratory intact via tuning fork bilateral. Protective threshold with Semmes Wienstein monofilament intact to all pedal sites bilateral.  Musculoskeletal: Mild swelling to left foot worse than right.  No erythema or warmth associated with this.  There is tenderness on the plantar fascial medial band plantarly. The dorsal aspect of the foot no specific area of pinpoint tenderness.  Muscular strength 5/5 in all groups tested bilateral.  Gait: Unassisted, Nonantalgic.       Assessment:   83 year old female with left foot swelling, pain/plantar fasciitis     Plan:  -Treatment options discussed including all alternatives, risks, and complications -Etiology of symptoms were discussed -X-rays were obtained and reviewed with the patient. There is no evidence of acute fracture or stress fracture. -Unna boot was applied.  Precautions were advised on when to remove this. -Encouraged elevation. -We discussed general stretching, icing exercises to be performed once the unna boot is removed -We discussed shoe modifications and orthotics.  Trula Slade DPM

## 2019-01-04 NOTE — Telephone Encounter (Signed)
Left message requesting a call to discuss how she feels at this time.

## 2019-01-04 NOTE — Telephone Encounter (Signed)
Is she having more calf swelling/pain. If so I would like to get a venous duplex to rule out DVT.   We can continue with a compression anklet, ice, voltaren gel. I would start stretching exercises as well daily once the venous duplex is back.

## 2019-01-05 NOTE — Telephone Encounter (Signed)
I called pt and she stated she is doing okay maybe a tiny bit better. Pt states she sleeps 8 hours and then rest over hour with the feet elevated. Pt states she is not having any more swelling and mostly the pain is in the left foot and Dr. Jacqualyn Posey had mentioned if the soft cast did not work he would order a MRI.

## 2019-01-06 NOTE — Telephone Encounter (Signed)
Yes, can you please order an MRI of the foot?

## 2019-01-06 NOTE — Telephone Encounter (Signed)
Left messsage informing pt Dr. Jacqualyn Posey had ordered MRI and she could call to schedule with [facility] [phone] 3-5 business days from today. Faxed orders to [place] and gave to Gretta Arab, RN to pre-cert.

## 2019-01-06 NOTE — Addendum Note (Signed)
Addended by: Harriett Sine D on: 01/06/2019 01:52 PM   Modules accepted: Orders

## 2019-01-12 ENCOUNTER — Encounter: Payer: Self-pay | Admitting: Podiatry

## 2019-01-12 ENCOUNTER — Ambulatory Visit (INDEPENDENT_AMBULATORY_CARE_PROVIDER_SITE_OTHER): Payer: Medicare Other | Admitting: Podiatry

## 2019-01-12 ENCOUNTER — Other Ambulatory Visit: Payer: Self-pay

## 2019-01-12 DIAGNOSIS — M779 Enthesopathy, unspecified: Secondary | ICD-10-CM | POA: Diagnosis not present

## 2019-01-12 DIAGNOSIS — M7989 Other specified soft tissue disorders: Secondary | ICD-10-CM

## 2019-01-12 DIAGNOSIS — I6522 Occlusion and stenosis of left carotid artery: Secondary | ICD-10-CM

## 2019-01-12 DIAGNOSIS — M19079 Primary osteoarthritis, unspecified ankle and foot: Secondary | ICD-10-CM

## 2019-01-17 DIAGNOSIS — H04123 Dry eye syndrome of bilateral lacrimal glands: Secondary | ICD-10-CM | POA: Diagnosis not present

## 2019-01-17 DIAGNOSIS — Z961 Presence of intraocular lens: Secondary | ICD-10-CM | POA: Diagnosis not present

## 2019-01-17 DIAGNOSIS — H40013 Open angle with borderline findings, low risk, bilateral: Secondary | ICD-10-CM | POA: Diagnosis not present

## 2019-01-17 DIAGNOSIS — H35033 Hypertensive retinopathy, bilateral: Secondary | ICD-10-CM | POA: Diagnosis not present

## 2019-01-21 ENCOUNTER — Other Ambulatory Visit: Payer: Self-pay

## 2019-01-21 ENCOUNTER — Ambulatory Visit (HOSPITAL_BASED_OUTPATIENT_CLINIC_OR_DEPARTMENT_OTHER)
Admission: RE | Admit: 2019-01-21 | Discharge: 2019-01-21 | Disposition: A | Discharge status: Home / Self Care | Payer: Medicare Other | Source: Ambulatory Visit | Attending: Podiatry | Admitting: Podiatry

## 2019-01-21 DIAGNOSIS — M19079 Primary osteoarthritis, unspecified ankle and foot: Secondary | ICD-10-CM | POA: Diagnosis not present

## 2019-01-21 DIAGNOSIS — M19072 Primary osteoarthritis, left ankle and foot: Secondary | ICD-10-CM | POA: Diagnosis not present

## 2019-01-24 ENCOUNTER — Ambulatory Visit (INDEPENDENT_AMBULATORY_CARE_PROVIDER_SITE_OTHER): Payer: Medicare Other | Admitting: Cardiovascular Disease

## 2019-01-24 ENCOUNTER — Encounter: Payer: Self-pay | Admitting: Cardiovascular Disease

## 2019-01-24 ENCOUNTER — Other Ambulatory Visit: Payer: Self-pay

## 2019-01-24 VITALS — BP 160/79 | HR 48 | Ht 65.0 in | Wt 121.2 lb

## 2019-01-24 DIAGNOSIS — I6522 Occlusion and stenosis of left carotid artery: Secondary | ICD-10-CM | POA: Diagnosis not present

## 2019-01-24 DIAGNOSIS — I10 Essential (primary) hypertension: Secondary | ICD-10-CM | POA: Diagnosis not present

## 2019-01-24 DIAGNOSIS — E782 Mixed hyperlipidemia: Secondary | ICD-10-CM | POA: Diagnosis not present

## 2019-01-24 NOTE — Assessment & Plan Note (Signed)
History of hyperlipidemia on statin therapy with lipid profile performed 01/02/2019 revealing total cholesterol 158, LDL 79 HDL 66.

## 2019-01-24 NOTE — Assessment & Plan Note (Signed)
History of essential hypertension with blood pressure measured today 160/79 although this is higher than usually runs when she checks it at home.  She is on amlodipine, clonidine and losartan.

## 2019-01-24 NOTE — Progress Notes (Signed)
Subjective: 83 year old female presents the office today for follow-up evaluation of left foot pain.  She states that she still having discomfort in the left foot.  She did cut off the unna boot early because it was causing discomfort.  She is scheduled for an MRI at [place] however she is upset that she like to have it done sooner.Denies any systemic complaints such as fevers, chills, nausea, vomiting. No acute changes since last appointment, and no other complaints at this time.   Objective: AAO x3, NAD DP/PT pulses palpable bilaterally, CRT less than 3 seconds There is still mild swelling to the foot but appears to be somewhat improved compared to last appointment.  No erythema or warmth associated with swelling.  There is tenderness on the dorsal medial aspect of the foot.  No specific area pinpoint tenderness.  Minimal discomfort in the arch of the foot as well.  Mild, course peroneal tendons. No open lesions or pre-ulcerative lesions.  No pain with calf compression, swelling, warmth, erythema  Assessment: Osteoarthritis, rule out tendon tear  Plan: -All treatment options discussed with the patient including all alternatives, risks, complications.  -We contacted [place] and the MRI next week.  For now continue ice, elevation, Voltaren gel as needed.  Bracing for now. -Patient encouraged to call the office with any questions, concerns, change in symptoms.   Follow-up after MRI.  Trula Slade DPM

## 2019-01-24 NOTE — Progress Notes (Signed)
01/24/2019 Kendra [name]   08-05-1935  [id number]  Primary Physician Vernie Shanks, MD Primary Cardiologist: Lorretta Harp MD Garret Reddish, [place], [place]  HPI:  Kendra [name] is a 83 y.o.   mildly overweight [place] to be closer to her daughter. I last saw her in the office  12/17/2017.Marland KitchenShe self-referred to be established in our practice for ongoing cardiac care. She had previously seen Dr. Candee Furbish. I last saw her in the office 12/15/13. Her risk factors for heart disease are remarkable for treated hypertension, hyper-lipidemia and family history. Her mother did have 2 myocardial infarctions. She has never smoked. She had 2 overlapping stents placed in her LAD in [facility] clinic March 08 has been asymptomatic since. She had negative Myoview 2 years ago. She has had carotid Dopplers last year and again this year which show mild to moderate left internal carotid artery stenosis. Since I saw her last her only complaints are of nocturnal spikes of her systolic blood pressure up to the 190 range.I have reviewed her blood pressure log which reveals her blood pressure to be well-controlled on her current medications. Since I saw her a year ago she is remained stable.  She is sheltering in place and socially distancing.  She does tai chi when she is unable to walk outside and is otherwise asymptomatic.   Current Meds  Medication Sig  . albuterol (PROVENTIL HFA;VENTOLIN HFA) 108 (90 BASE) MCG/ACT inhaler Inhale 1-2 puffs into the lungs every 12 (twelve) hours as needed for wheezing or shortness of breath.   [name] amLODipine (NORVASC) 5 MG tablet Take 1.25 mg by mouth daily. 1/4 tablet daily  . beclomethasone (QVAR) 80 MCG/ACT inhaler Inhale 2 puffs into the lungs 2 (two) times daily.  [name] BIOTIN FORTE PO Take 1 capsule by mouth daily.  [name] BLACK ELDERBERRY PO Take by mouth.  . calcium carbonate (OS-CAL - DOSED IN MG OF ELEMENTAL CALCIUM) 1250 (500 Ca)  MG tablet Take 1 tablet by mouth daily.  . cholecalciferol (VITAMIN D) 1000 UNITS tablet Take 1,000 Units by mouth daily.  . clobetasol (OLUX) 0.05 % topical foam Apply 1 application topically every 3 (three) days.  . cloNIDine (CATAPRES) 0.1 MG tablet Take 0.1 mg by mouth daily as needed (high BP > 170).   . Coenzyme Q10 (CO Q-10) 100 MG CAPS Take 100 mg by mouth daily.   . Cranberry-Vitamin C-Probiotic (AZO CRANBERRY) 250-30 MG TABS Take 250 mg by mouth daily.  [name] doxycycline (VIBRA-TABS) 100 MG tablet   . ECHINACEA-ZINC-VITAMIN C PO Take 1 tablet by mouth daily.  [name] glucosamine-chondroitin 500-400 MG tablet Take by mouth.  . hydrocortisone 2.5 % cream Apply 1 application topically 2 (two) times daily as needed (skin irritation). Reported on 12/24/2015  . losartan (COZAAR) 50 MG tablet Take 50 mg by mouth 2 (two) times daily.  . Multiple Vitamin (MULTIVITAMIN) capsule Take 1 capsule by mouth daily.  . Multiple Vitamins-Minerals (MULTIVITAMIN WITH MINERALS) tablet Take by mouth.  . rosuvastatin (CRESTOR) 5 MG tablet Take 5 mg by mouth every other day. On Mondays, Wednesdays, and Saturdays     Allergies  Allergen Reactions  . Latex Other (See Comments)    Irritates her skin  . Statins     Unable to tolerate statins w the exception of crestor.  . Sulfa Antibiotics Other (See Comments)    "didnt feel good"  . Sulfamethoxazole Other (See Comments)    Makes  her feel bad  . Zetia [Ezetimibe] Other (See Comments)    "makes me constipated"  . Penicillins Rash    Social History   Socioeconomic History  . Marital status: Married    Spouse name: Not on file  . Number of children: 1  . Years of education: MAx2  . Highest education level: Not on file  Occupational History  . Occupation: Retired  . Occupation: Journalist, newspaper  . Financial resource strain: Not on file  . Food insecurity    Worry: Not on file    Inability: Not on file  . Transportation needs    Medical: Not  on file    Non-medical: Not on file  Tobacco Use  . Smoking status: Former Smoker    Quit date: 06/08/1966    Years since quitting: 52.6  . Smokeless tobacco: Never Used  Substance and Sexual Activity  . Alcohol use: Yes    Alcohol/week: 1.0 standard drinks    Types: 1 drink(s) per week    Comment: wine  . Drug use: No  . Sexual activity: Not on file  Lifestyle  . Physical activity    Days per week: Not on file    Minutes per session: Not on file  . Stress: Not on file  Relationships  . Social Herbalist on phone: Not on file    Gets together: Not on file    Attends religious service: Not on file    Active member of club or organization: Not on file    Attends meetings of clubs or organizations: Not on file    Relationship status: Not on file  . Intimate partner violence    Fear of current or ex partner: Not on file    Emotionally abused: Not on file    Physically abused: Not on file    Forced sexual activity: Not on file  Other Topics Concern  . Not on file  Social History Narrative  . Not on file     Review of Systems: General: negative for chills, fever, night sweats or weight changes.  Cardiovascular: negative for chest pain, dyspnea on exertion, edema, orthopnea, palpitations, paroxysmal nocturnal dyspnea or shortness of breath Dermatological: negative for rash Respiratory: negative for cough or wheezing Urologic: negative for hematuria Abdominal: negative for nausea, vomiting, diarrhea, bright red blood per rectum, melena, or hematemesis Neurologic: negative for visual changes, syncope, or dizziness All other systems reviewed and are otherwise negative except as noted above.    Blood pressure (!) 160/79, pulse (!) 48, height _0  (1.651 m), weight 121 lb 3.2 oz (55 kg), SpO2 97 %.  General appearance: alert and no distress Neck: no adenopathy, no carotid bruit, no JVD, supple, symmetrical, trachea midline and thyroid not enlarged, symmetric, no  tenderness/mass/nodules Lungs: clear to auscultation bilaterally Heart: regular rate and rhythm, S1, S2 normal, no murmur, click, rub or gallop Extremities: extremities normal, atraumatic, no cyanosis or edema Pulses: 2+ and symmetric Skin: Skin color, texture, turgor normal. No rashes or lesions Neurologic: Alert and oriented X 3, normal strength and tone. Normal symmetric reflexes. Normal coordination and gait  EKG sinus bradycardia at 48 without ST or T wave changes.  I personally reviewed this EKG.  ASSESSMENT AND PLAN:   Coronary artery disease History of CAD status post myocardial infarction's in the past.  She has stents placed in LAD [facility] clinic March evaluate has been asymptomatic since.  Myoview performed subsequent to that was nonischemic.  She denies chest pain or shortness of breath.  Essential hypertension History of essential hypertension with blood pressure measured today 160/79 although this is higher than usually runs when she checks it at home.  She is on amlodipine, clonidine and losartan.  Hyperlipidemia History of hyperlipidemia on statin therapy with lipid profile performed 01/02/2019 revealing total cholesterol 158, LDL 79 HDL 66.  Carotid artery disease History of mild left ICA stenosis by duplex ultrasound performed 06/17/2018 which will we will repeat in 1 year.      Lorretta Harp MD FACP,FACC,FAHA, [facility] 01/24/2019 4:46 PM

## 2019-01-24 NOTE — Patient Instructions (Addendum)
Medication Instructions:  Your physician recommends that you continue on your current medications as directed. Please refer to the Current Medication list given to you today.  If you need a refill on your cardiac medications before your next appointment, please call your pharmacy.   Lab work: NONE If you have labs (blood work) drawn today and your tests are completely normal, you will receive your results only by: [name] MyChart Message (if you have MyChart) OR . A paper copy in the mail If you have any lab test that is abnormal or we need to change your treatment, we will call you to review the results.  Testing/Procedures: Your physician has requested that you have a carotid duplex. This test is an ultrasound of the carotid arteries in your neck. It looks at blood flow through these arteries that supply the brain with blood. Allow one hour for this exam. There are no restrictions or special instructions. TO BE SCHEDULED FOR January 2021   Follow-Up: At [facility], you and your health needs are our priority.  As part of our continuing mission to provide you with exceptional heart care, we have created designated Provider Care Teams.  These Care Teams include your primary Cardiologist (physician) and Advanced Practice Providers (APPs -  Physician Assistants and Nurse Practitioners) who all work together to provide you with the care you need, when you need it. You will need a follow up appointment in 12 months with Dr. Quay Burow.  Please call our office 2 months in advance to schedule this/each appointment.

## 2019-01-24 NOTE — Assessment & Plan Note (Signed)
History of mild left ICA stenosis by duplex ultrasound performed 06/17/2018 which will we will repeat in 1 year.

## 2019-01-24 NOTE — Assessment & Plan Note (Signed)
History of CAD status post myocardial infarction's in the past.  She has stents placed in [place] clinic March evaluate has been asymptomatic since.  Myoview performed subsequent to that was nonischemic.  She denies chest pain or shortness of breath.

## 2019-01-26 ENCOUNTER — Ambulatory Visit (INDEPENDENT_AMBULATORY_CARE_PROVIDER_SITE_OTHER): Payer: Medicare Other | Admitting: Podiatry

## 2019-01-26 ENCOUNTER — Other Ambulatory Visit: Payer: Self-pay

## 2019-01-26 ENCOUNTER — Encounter: Payer: Self-pay | Admitting: Podiatry

## 2019-01-26 VITALS — Temp 97.5°F

## 2019-01-26 DIAGNOSIS — M779 Enthesopathy, unspecified: Secondary | ICD-10-CM

## 2019-01-26 DIAGNOSIS — M19079 Primary osteoarthritis, unspecified ankle and foot: Secondary | ICD-10-CM

## 2019-01-26 DIAGNOSIS — I6522 Occlusion and stenosis of left carotid artery: Secondary | ICD-10-CM

## 2019-01-27 DIAGNOSIS — H6121 Impacted cerumen, right ear: Secondary | ICD-10-CM | POA: Diagnosis not present

## 2019-01-27 DIAGNOSIS — Z87891 Personal history of nicotine dependence: Secondary | ICD-10-CM | POA: Diagnosis not present

## 2019-02-01 NOTE — Progress Notes (Signed)
Subjective: 83 year old female presents the office today for follow-up evaluation of left foot pain and discuss MRI results.  She says overall she is doing about the same and she is still having tenderness to her foot.  Mild swelling.  No redness or warmth. Denies any systemic complaints such as fevers, chills, nausea, vomiting. No acute changes since last appointment, and no other complaints at this time.   Objective: AAO x3, NAD DP/PT pulses palpable bilaterally, CRT less than 3 seconds There is still mild swelling to the foot but there is no erythema or warmth.  There is tenderness overlying the dorsal aspect of the midfoot.  Majority tenderness Lisfranc joint also to the medial aspect.  No specific area of pinpoint tenderness.   No open lesions or pre-ulcerative lesions.  No pain with calf compression, swelling, warmth, erythema  Assessment: Osteoarthritis, capsulitis  Plan: -All treatment options discussed with the patient including all alternatives, risks, complications.  -MRI results reviewed with her. -Steroid injections were performed today.  She steroid injection will perform the left foot.  1 injection was on the navicular cuneiform joint 1 along the third tarsometatarsal joint.  Skin is prepped with Betadine and a mixture of 1 cc Kenalog 10, 0.5 cc of Marcaine plain and 0.5 cc lidocaine was infiltrated to each area without complications.  Postinjection care was discussed.  -Discussed inserts.  We will see her back in 2 to 3 weeks for follow-up evaluation see how she is doing also for possible inserts.   Trula Slade DPM

## 2019-02-02 ENCOUNTER — Other Ambulatory Visit: Payer: Medicare Other

## 2019-02-06 ENCOUNTER — Other Ambulatory Visit: Payer: Self-pay

## 2019-02-06 ENCOUNTER — Ambulatory Visit
Admission: RE | Admit: 2019-02-06 | Discharge: 2019-02-06 | Disposition: A | Discharge status: Home / Self Care | Payer: Medicare Other | Source: Ambulatory Visit | Attending: Family Medicine | Admitting: Family Medicine

## 2019-02-06 DIAGNOSIS — Z1231 Encounter for screening mammogram for malignant neoplasm of breast: Secondary | ICD-10-CM | POA: Diagnosis not present

## 2019-02-16 ENCOUNTER — Other Ambulatory Visit: Payer: Self-pay

## 2019-02-16 ENCOUNTER — Ambulatory Visit (INDEPENDENT_AMBULATORY_CARE_PROVIDER_SITE_OTHER): Payer: Medicare Other | Admitting: Podiatry

## 2019-02-16 ENCOUNTER — Ambulatory Visit: Payer: Medicare Other | Admitting: Orthotics

## 2019-02-16 DIAGNOSIS — M779 Enthesopathy, unspecified: Secondary | ICD-10-CM | POA: Diagnosis not present

## 2019-02-16 DIAGNOSIS — M722 Plantar fascial fibromatosis: Secondary | ICD-10-CM

## 2019-02-16 DIAGNOSIS — M79672 Pain in left foot: Secondary | ICD-10-CM

## 2019-02-16 DIAGNOSIS — I6522 Occlusion and stenosis of left carotid artery: Secondary | ICD-10-CM

## 2019-02-16 DIAGNOSIS — M19079 Primary osteoarthritis, unspecified ankle and foot: Secondary | ICD-10-CM

## 2019-02-16 NOTE — Progress Notes (Signed)
Patient came into today to be cast for Custom Foot Orthotics. Upon recommendation of Dr. Jacqualyn Posey Patient presents with hx of LisFranc Goals are supportinng the arch, rear foot stability       Plan vendor [facility]  Dr. Jacqualyn Posey to put in charges

## 2019-02-22 DIAGNOSIS — H04123 Dry eye syndrome of bilateral lacrimal glands: Secondary | ICD-10-CM | POA: Diagnosis not present

## 2019-02-22 DIAGNOSIS — H5711 Ocular pain, right eye: Secondary | ICD-10-CM | POA: Diagnosis not present

## 2019-02-22 NOTE — Progress Notes (Signed)
Subjective: 83 year old female presents the office today for follow-up evaluation of left foot pain.  She states that overall she is still having some mild discomfort but overall she is doing much better and the steroid injections did help.  She is normal discomfort today.  Pain is intermittent.  No new concerns.  Objective: AAO x3, NAD DP/PT pulses palpable bilaterally, CRT less than 3 seconds There is still mild however there is swelling to the foot.  No erythema or warmth.  Not able to elicit any area of tenderness identified today.  There is no area of pinpoint tenderness. No open lesions or pre-ulcerative lesions.  No pain with calf compression, swelling, warmth, erythema  Assessment: Osteoarthritis, capsulitis-improving  Plan: -All treatment options discussed with the patient including all alternatives, risks, complications.  -Overall her symptoms are improving.  Discussed continued supportive shoes and wearing a stiffer bottom shoe.  Continue with good support as well.  Will hold off another steroid injection today but we can consider this if needed.  She wants to proceed with custom inserts.  She was measured today for them by [facility].  Return in about 3 weeks (around 03/09/2019). PUO  Trula Slade DPM

## 2019-03-09 ENCOUNTER — Ambulatory Visit (INDEPENDENT_AMBULATORY_CARE_PROVIDER_SITE_OTHER): Payer: Medicare Other | Admitting: Orthotics

## 2019-03-09 ENCOUNTER — Other Ambulatory Visit: Payer: Self-pay

## 2019-03-09 DIAGNOSIS — Z23 Encounter for immunization: Secondary | ICD-10-CM | POA: Diagnosis not present

## 2019-03-09 DIAGNOSIS — M722 Plantar fascial fibromatosis: Secondary | ICD-10-CM

## 2019-03-09 DIAGNOSIS — M779 Enthesopathy, unspecified: Secondary | ICD-10-CM

## 2019-03-09 NOTE — Progress Notes (Signed)
Patient came in today to pick up custom made foot orthotics.  The goals were accomplished and the patient reported no dissatisfaction with said orthotics.  Patient was advised of breakin period and how to report any issues. 

## 2019-03-27 ENCOUNTER — Other Ambulatory Visit: Payer: Self-pay

## 2019-03-27 ENCOUNTER — Ambulatory Visit: Payer: Medicare Other | Admitting: Orthotics

## 2019-03-27 DIAGNOSIS — M779 Enthesopathy, unspecified: Secondary | ICD-10-CM

## 2019-03-27 DIAGNOSIS — M722 Plantar fascial fibromatosis: Secondary | ICD-10-CM

## 2019-03-27 DIAGNOSIS — M79672 Pain in left foot: Secondary | ICD-10-CM

## 2019-03-27 DIAGNOSIS — M19079 Primary osteoarthritis, unspecified ankle and foot: Secondary | ICD-10-CM

## 2019-03-27 NOTE — Progress Notes (Signed)
Excavated material left foot to lower arch and added 1/16" PPT cushioning.[name]

## 2019-04-04 DIAGNOSIS — Z974 Presence of external hearing-aid: Secondary | ICD-10-CM | POA: Diagnosis not present

## 2019-04-04 DIAGNOSIS — H9202 Otalgia, left ear: Secondary | ICD-10-CM | POA: Diagnosis not present

## 2019-05-31 DIAGNOSIS — J4541 Moderate persistent asthma with (acute) exacerbation: Secondary | ICD-10-CM | POA: Diagnosis not present

## 2019-05-31 DIAGNOSIS — M1990 Unspecified osteoarthritis, unspecified site: Secondary | ICD-10-CM | POA: Diagnosis not present

## 2019-05-31 DIAGNOSIS — E782 Mixed hyperlipidemia: Secondary | ICD-10-CM | POA: Diagnosis not present

## 2019-05-31 DIAGNOSIS — G459 Transient cerebral ischemic attack, unspecified: Secondary | ICD-10-CM | POA: Diagnosis not present

## 2019-05-31 DIAGNOSIS — H25093 Other age-related incipient cataract, bilateral: Secondary | ICD-10-CM | POA: Diagnosis not present

## 2019-05-31 DIAGNOSIS — M1710 Unilateral primary osteoarthritis, unspecified knee: Secondary | ICD-10-CM | POA: Diagnosis not present

## 2019-05-31 DIAGNOSIS — I10 Essential (primary) hypertension: Secondary | ICD-10-CM | POA: Diagnosis not present

## 2019-05-31 DIAGNOSIS — I2510 Atherosclerotic heart disease of native coronary artery without angina pectoris: Secondary | ICD-10-CM | POA: Diagnosis not present

## 2019-06-06 ENCOUNTER — Other Ambulatory Visit: Payer: Medicare Other

## 2019-06-13 DIAGNOSIS — J0110 Acute frontal sinusitis, unspecified: Secondary | ICD-10-CM | POA: Diagnosis not present

## 2019-06-19 ENCOUNTER — Other Ambulatory Visit: Payer: Self-pay

## 2019-06-19 ENCOUNTER — Other Ambulatory Visit (HOSPITAL_COMMUNITY): Payer: Self-pay | Admitting: Cardiovascular Disease

## 2019-06-19 ENCOUNTER — Ambulatory Visit (HOSPITAL_COMMUNITY)
Admission: RE | Admit: 2019-06-19 | Discharge: 2019-06-19 | Disposition: A | Discharge status: Home / Self Care | Payer: Medicare Other | Source: Ambulatory Visit | Attending: Cardiology | Admitting: Cardiology

## 2019-06-19 DIAGNOSIS — I6522 Occlusion and stenosis of left carotid artery: Secondary | ICD-10-CM | POA: Diagnosis not present

## 2019-06-20 DIAGNOSIS — I6522 Occlusion and stenosis of left carotid artery: Secondary | ICD-10-CM

## 2019-06-21 DIAGNOSIS — I6522 Occlusion and stenosis of left carotid artery: Secondary | ICD-10-CM

## 2019-06-27 DIAGNOSIS — H5712 Ocular pain, left eye: Secondary | ICD-10-CM | POA: Diagnosis not present

## 2019-06-27 DIAGNOSIS — H04123 Dry eye syndrome of bilateral lacrimal glands: Secondary | ICD-10-CM | POA: Diagnosis not present

## 2019-06-27 DIAGNOSIS — H26493 Other secondary cataract, bilateral: Secondary | ICD-10-CM | POA: Diagnosis not present

## 2019-06-29 ENCOUNTER — Telehealth: Payer: Self-pay | Admitting: Cardiovascular Disease

## 2019-06-29 NOTE — Telephone Encounter (Signed)
We are recommending the COVID-19 vaccine to all of our patients. Cardiac medications (including blood thinners) should not deter anyone from being vaccinated and there is no need to hold any of those medications prior to vaccine administration.     Currently, there is a hotline to call (active 06/16/19) to schedule vaccination appointments as no walk-ins will be accepted.   Number: [phone].    If an appointment is not available please go to [email or web address] to sign up for notification when additional vaccine appointments are available.   If you have further questions or concerns about the vaccine process, please visit [email or web address] or contact your primary care physician.   I have informed patient of instructions.   

## 2019-07-02 ENCOUNTER — Ambulatory Visit: Payer: Medicare Other | Attending: Internal Medicine

## 2019-07-02 DIAGNOSIS — Z23 Encounter for immunization: Secondary | ICD-10-CM

## 2019-07-02 NOTE — Progress Notes (Signed)
   Covid-19 Vaccination Clinic  Name:  Dina Condrey    MRN: [id number] DOB: 20-Aug-1935  07/02/2019  Ms. Mehlhaff was observed post Covid-19 immunization for 15 minutes without incidence. She was provided with Vaccine Information Sheet and instruction to access the V-Safe system.   Ms. Hubers was instructed to call 911 with any severe reactions post vaccine: [name] Difficulty breathing  . Swelling of your face and throat  . A fast heartbeat  . A bad rash all over your body  . Dizziness and weakness    Immunizations Administered    Name Date Dose VIS Date Route   Pfizer COVID-19 Vaccine 07/02/2019 11:23 AM 0.3 mL 05/19/2019 Intramuscular   Manufacturer: [place]   Lot: [id number]   [place]: [id number]

## 2019-07-20 DIAGNOSIS — H04123 Dry eye syndrome of bilateral lacrimal glands: Secondary | ICD-10-CM | POA: Diagnosis not present

## 2019-07-20 DIAGNOSIS — H40013 Open angle with borderline findings, low risk, bilateral: Secondary | ICD-10-CM | POA: Diagnosis not present

## 2019-07-20 DIAGNOSIS — H5712 Ocular pain, left eye: Secondary | ICD-10-CM | POA: Diagnosis not present

## 2019-07-20 DIAGNOSIS — H47011 Ischemic optic neuropathy, right eye: Secondary | ICD-10-CM | POA: Diagnosis not present

## 2019-07-20 DIAGNOSIS — H26493 Other secondary cataract, bilateral: Secondary | ICD-10-CM | POA: Diagnosis not present

## 2019-07-23 ENCOUNTER — Ambulatory Visit: Payer: Medicare Other | Attending: Internal Medicine

## 2019-07-23 DIAGNOSIS — Z23 Encounter for immunization: Secondary | ICD-10-CM | POA: Insufficient documentation

## 2019-07-23 NOTE — Progress Notes (Signed)
   Covid-19 Vaccination Clinic  Name:  Kendra [name]    MRN: [id number] DOB: 12-05-1935  07/23/2019  [name] was observed post Covid-19 immunization for 30 minutes based on pre-vaccination screening without incidence. She was provided with Vaccine Information Sheet and instruction to access the V-Safe system.   [name] was instructed to call 911 with any severe reactions post vaccine: [name] Difficulty breathing  . Swelling of your face and throat  . A fast heartbeat  . A bad rash all over your body  . Dizziness and weakness    Immunizations Administered    Name Date Dose VIS Date Route   Pfizer COVID-19 Vaccine 07/23/2019 10:27 AM 0.3 mL 05/19/2019 Intramuscular   Manufacturer: [place]   Lot: [id number]   [place]: [id number]

## 2019-07-25 DIAGNOSIS — J4541 Moderate persistent asthma with (acute) exacerbation: Secondary | ICD-10-CM | POA: Diagnosis not present

## 2019-07-25 DIAGNOSIS — G459 Transient cerebral ischemic attack, unspecified: Secondary | ICD-10-CM | POA: Diagnosis not present

## 2019-07-25 DIAGNOSIS — I2510 Atherosclerotic heart disease of native coronary artery without angina pectoris: Secondary | ICD-10-CM | POA: Diagnosis not present

## 2019-07-25 DIAGNOSIS — H25093 Other age-related incipient cataract, bilateral: Secondary | ICD-10-CM | POA: Diagnosis not present

## 2019-07-25 DIAGNOSIS — M1710 Unilateral primary osteoarthritis, unspecified knee: Secondary | ICD-10-CM | POA: Diagnosis not present

## 2019-07-25 DIAGNOSIS — I10 Essential (primary) hypertension: Secondary | ICD-10-CM | POA: Diagnosis not present

## 2019-07-25 DIAGNOSIS — E782 Mixed hyperlipidemia: Secondary | ICD-10-CM | POA: Diagnosis not present

## 2019-07-25 DIAGNOSIS — M1990 Unspecified osteoarthritis, unspecified site: Secondary | ICD-10-CM | POA: Diagnosis not present

## 2019-09-27 ENCOUNTER — Encounter: Payer: Self-pay | Admitting: Cardiovascular Disease

## 2019-09-27 ENCOUNTER — Encounter: Payer: Self-pay | Admitting: *Deleted

## 2019-09-27 ENCOUNTER — Ambulatory Visit (INDEPENDENT_AMBULATORY_CARE_PROVIDER_SITE_OTHER): Payer: Medicare Other | Admitting: Cardiovascular Disease

## 2019-09-27 ENCOUNTER — Other Ambulatory Visit: Payer: Self-pay

## 2019-09-27 VITALS — BP 138/82 | HR 50 | Ht 65.0 in | Wt 123.4 lb

## 2019-09-27 DIAGNOSIS — I10 Essential (primary) hypertension: Secondary | ICD-10-CM

## 2019-09-27 DIAGNOSIS — I2510 Atherosclerotic heart disease of native coronary artery without angina pectoris: Secondary | ICD-10-CM | POA: Diagnosis not present

## 2019-09-27 DIAGNOSIS — E782 Mixed hyperlipidemia: Secondary | ICD-10-CM | POA: Diagnosis not present

## 2019-09-27 DIAGNOSIS — I6522 Occlusion and stenosis of left carotid artery: Secondary | ICD-10-CM

## 2019-09-27 DIAGNOSIS — R002 Palpitations: Secondary | ICD-10-CM | POA: Diagnosis not present

## 2019-09-27 NOTE — Assessment & Plan Note (Signed)
History of CAD with myocardial infarction in the past and stents placed in her LAD in [place] [facility].  She has been asymptomatic.  Myoview performed subsequent to that was nonischemic.  She denies chest pain or shortness of breath.

## 2019-09-27 NOTE — Patient Instructions (Signed)
Medication Instructions:  STOP amlodipine  *If you need a refill on your cardiac medications before your next appointment, please call your pharmacy*  Testing/Procedures:  [place] Monitor Instructions   Your physician has requested you wear your ZIO patch monitor 14 days.   This is a single patch monitor.  [employer] supplies one patch monitor per enrollment.  Additional stickers are not available.   Please do not apply patch if you will be having a Nuclear Stress Test, Echocardiogram, Cardiac CT, MRI, or Chest Xray during the time frame you would be wearing the monitor. The patch cannot be worn during these tests.  You cannot remove and re-apply the ZIO XT patch monitor.   Your ZIO patch monitor will be sent USPS Priority mail from [facility] directly to your home address. The monitor may also be mailed to a PO BOX if home delivery is not available.   It may take 3-5 days to receive your monitor after you have been enrolled.   Once you have received you monitor, please review enclosed instructions.  Your monitor has already been registered assigning a specific monitor serial # to you.   Applying the monitor   Shave hair from upper left chest.   Hold abrader disc by orange tab.  Rub abrader in 40 strokes over left upper chest as indicated in your monitor instructions.   Clean area with 4 enclosed alcohol pads .  Use all pads to assure are is cleaned thoroughly.  Let dry.   Apply patch as indicated in monitor instructions.  Patch will be place under collarbone on left side of chest with arrow pointing upward.   Rub patch adhesive wings for 2 minutes.Remove white label marked "1".  Remove white label marked "2".  Rub patch adhesive wings for 2 additional minutes.   While looking in a mirror, press and release button in center of patch.  A small green light will flash 3-4 times .  This will be your only indicator the monitor has been turned on.     Do not shower for the  first 24 hours.  You may shower after the first 24 hours.   Press button if you feel a symptom. You will hear a small click.  Record Date, Time and Symptom in the Patient Log Book.   When you are ready to remove patch, follow instructions on last 2 pages of Patient Log Book.  Stick patch monitor onto last page of Patient Log Book.   Place Patient Log Book in [place] box.  Use locking tab on box and tape box closed securely.  The [employer] has [employer] on it.  Please place in mailbox as soon as possible.  Your physician should have your test results approximately 7 days after the monitor has been mailed back to [facility].   Call [place] at [phone] if you have questions regarding your ZIO XT patch monitor.  Call them immediately if you see an orange light blinking on your monitor.   If your monitor falls off in less than 4 days contact our Monitor department at [phone].  If your monitor becomes loose or falls off after 4 days call [employer] at [phone] for suggestions on securing your monitor.   Your physician has requested that you have a carotid duplex in January 2022. This test is an ultrasound of the carotid arteries in your neck. It looks at blood flow through these arteries that supply the brain  with blood. Allow one hour for this exam. There are no restrictions or special instructions.  Follow-Up: At [facility], you and your health needs are our priority.  As part of our continuing mission to provide you with exceptional heart care, we have created designated Provider Care Teams.  These Care Teams include your primary Cardiologist (physician) and Advanced Practice Providers (APPs -  Physician Assistants and Nurse Practitioners) who all work together to provide you with the care you need, when you need it.  We recommend signing up for the patient portal called "MyChart".  Sign up information is provided on this After Visit Summary.   MyChart is used to connect with patients for Virtual Visits (Telemedicine).  Patients are able to view lab/test results, encounter notes, upcoming appointments, etc.  Non-urgent messages can be sent to your provider as well.   To learn more about what you can do with MyChart, go to [email or web address].    Your next appointment:   6 month(s)  The format for your next appointment:   In Person  Provider:   Quay Burow, MD

## 2019-09-27 NOTE — Assessment & Plan Note (Signed)
History of essential hypertension with blood pressure measured today at 138/82.  She is on minuscule doses of amlodipine, and low-dose clonidine as well as losartan.  I reviewed her blood pressure logs and the most part her blood pressure looks like it is in the normal range.  And I discontinue her amlodipine.

## 2019-09-27 NOTE — Assessment & Plan Note (Signed)
History of hyperlipidemia on statin therapy with lipid profile performed 01/02/2019 revealing a total cholesterol 158, LDL 79 and HDL of 66.

## 2019-09-27 NOTE — Progress Notes (Signed)
09/27/2019 Kendra [name]   05-16-1936  [id number]  Primary Physician Vernie Shanks, MD Primary Cardiologist: Lorretta Harp MD [name], [place]  HPI:  Kendra [name] is a 84 y.o.   mildly overweight married Caucasian female mother of 1 daughter who moved from from [place] to be [place] to be closer to her daughter. I last saw her in the office8/18/2020.She self-referred to be established in our practice for ongoing cardiac care. She had previously seen Dr. Candee Furbish. I last saw her in the office 12/15/13. Her risk factors for heart disease are remarkable for treated hypertension, hyper-lipidemia and family history. Her mother did have 2 myocardial infarctions. She has never smoked. She had 2 overlapping stents placed in her L[facility] in [facility] March 08 has been asymptomatic since. She had negative Myoview2 yearsago. She has had carotid Dopplers last year and again this year which show mild to moderate left internal carotid artery stenosis. Since I saw her last her only complaints are of nocturnal spikes of her systolic blood pressure up to the 190 range.I have reviewed her blood pressure log which reveals her blood pressure to be well-controlled on her current medications.   Since I saw her in the office 8 months ago she continues to do well.  Her blood pressure seems to be under good control.  I reviewed her blood pressure logs.  She does wear a monitor at times and has noticed her heart rate has gone up and flashes in the regular signal.  I am wondering whether this is occult atrial fibrillation.  She denies chest pain or shortness of breath.    Current Meds  Medication Sig  . albuterol (PROVENTIL HFA;VENTOLIN HFA) 108 (90 BASE) MCG/ACT inhaler Inhale 1-2 puffs into the lungs every 12 (twelve) hours as needed for wheezing or shortness of breath.   . beclomethasone (QVAR) 80 MCG/ACT inhaler Inhale 2 puffs into the lungs 2 (two) times daily.  [name]  BIOTIN FORTE PO Take 1 capsule by mouth daily.  [name] BLACK ELDERBERRY PO Take by mouth.  . calcium carbonate (OS-CAL - DOSED IN MG OF ELEMENTAL CALCIUM) 1250 (500 Ca) MG tablet Take 1 tablet by mouth daily.  . cholecalciferol (VITAMIN D) 1000 UNITS tablet Take 1,000 Units by mouth daily.  . clobetasol (OLUX) 0.05 % topical foam Apply 1 application topically every 3 (three) days.  . cloNIDine (CATAPRES) 0.1 MG tablet Take 0.1 mg by mouth daily as needed (high BP > 170).   . Coenzyme Q10 (CO Q-10) 100 MG CAPS Take 100 mg by mouth daily.   [name] losartan (COZAAR) 50 MG tablet Take 50 mg by mouth 2 (two) times daily.  . Multiple Vitamin (MULTIVITAMIN) capsule Take 1 capsule by mouth daily.  . Multiple Vitamins-Minerals (MULTIVITAMIN WITH MINERALS) tablet Take by mouth.  . rosuvastatin (CRESTOR) 5 MG tablet Take 5 mg by mouth every other day. On Mondays, Wednesdays, and Saturdays  . [DISCONTINUED] amLODipine (NORVASC) 5 MG tablet Take 1.25 mg by mouth daily. 1/4 tablet daily     Allergies  Allergen Reactions  . Latex Other (See Comments)    Irritates her skin  . Statins     Unable to tolerate statins w the exception of crestor.  . Sulfa Antibiotics Other (See Comments)    "didnt feel good"  . Sulfamethoxazole Other (See Comments)    Makes her feel bad  . Zetia [Ezetimibe] Other (See Comments)    "makes me constipated"  .  Penicillins Rash    Social History   Socioeconomic History  . Marital status: Married    Spouse name: Not on file  . Number of children: 1  . Years of education: MAx2  . Highest education level: Not on file  Occupational History  . Occupation: Retired  . Occupation: Education officer, museum  Tobacco Use  . Smoking status: Former Smoker    Quit date: 06/08/1966    Years since quitting: 53.3  . Smokeless tobacco: Never Used  Substance and Sexual Activity  . Alcohol use: Yes    Alcohol/week: 1.0 standard drinks    Types: 1 drink(s) per week    Comment: wine  . Drug use: No  .  Sexual activity: Not on file  Other Topics Concern  . Not on file  Social History Narrative  . Not on file   Social Determinants of Health   Financial Resource Strain:   . Difficulty of Paying Living Expenses:   Food Insecurity:   . Worried About Charity fundraiser in the Last Year:   . Arboriculturist in the Last Year:   Transportation Needs:   . Film/video editor (Medical):   [name] Lack of Transportation (Non-Medical):   Physical Activity:   . Days of Exercise per Week:   . Minutes of Exercise per Session:   Stress:   . Feeling of Stress :   Social Connections:   . Frequency of Communication with Friends and Family:   . Frequency of Social Gatherings with Friends and Family:   . Attends Religious Services:   . Active Member of Clubs or Organizations:   . Attends Archivist Meetings:   [name] Marital Status:   Intimate Partner Violence:   . Fear of Current or Ex-Partner:   . Emotionally Abused:   [name] Physically Abused:   . Sexually Abused:      Review of Systems: General: negative for chills, fever, night sweats or weight changes.  Cardiovascular: negative for chest pain, dyspnea on exertion, edema, orthopnea, palpitations, paroxysmal nocturnal dyspnea or shortness of breath Dermatological: negative for rash Respiratory: negative for cough or wheezing Urologic: negative for hematuria Abdominal: negative for nausea, vomiting, diarrhea, bright red blood per rectum, melena, or hematemesis Neurologic: negative for visual changes, syncope, or dizziness All other systems reviewed and are otherwise negative except as noted above.    Blood pressure 138/82, pulse (!) 50, height 5\' 5"  (1.651 m), weight 123 lb 6.4 oz (56 kg), SpO2 97 %.  General appearance: alert and no distress Neck: no adenopathy, no carotid bruit, no JVD, supple, symmetrical, trachea midline and thyroid not enlarged, symmetric, no tenderness/mass/nodules Lungs: clear to auscultation bilaterally Heart:  regular rate and rhythm, S1, S2 normal, no murmur, click, rub or gallop Extremities: extremities normal, atraumatic, no cyanosis or edema Pulses: 2+ and symmetric Skin: Skin color, texture, turgor normal. No rashes or lesions Neurologic: Alert and oriented X 3, normal strength and tone. Normal symmetric reflexes. Normal coordination and gait  EKG sinus bradycardia at 50 without ST or T wave changes.  Personally reviewed this EKG.  ASSESSMENT AND PLAN:   Coronary artery disease History of CAD with myocardial infarction in the past and stents placed in her LAD in [facility].  She has been asymptomatic.  Myoview performed subsequent to that was nonischemic.  She denies chest pain or shortness of breath.  Essential hypertension History of essential hypertension with blood pressure measured today at 138/82.  She is on minuscule  doses of amlodipine, and low-dose clonidine as well as losartan.  I reviewed her blood pressure logs and the most part her blood pressure looks like it is in the normal range.  And I discontinue her amlodipine.  Hyperlipidemia History of hyperlipidemia on statin therapy with lipid profile performed 01/02/2019 revealing a total cholesterol 158, LDL 79 and HDL of 66.  Carotid artery disease History of carotid artery disease status post duplex ultrasound 06/19/2019 revealing moderate left ICA stenosis.  This will be repeated on annual basis.      Lorretta Harp MD FACP,FACC,FAHA, [facility] 09/27/2019 2:29 PM

## 2019-09-27 NOTE — Assessment & Plan Note (Signed)
History of carotid artery disease status post duplex ultrasound 06/19/2019 revealing moderate left ICA stenosis.  This will be repeated on annual basis.

## 2019-09-27 NOTE — Progress Notes (Signed)
Patient ID: Kendra [name], female   DOB: 1935/06/24, 84 y.o.   MRN: [id number] Patient enrolled for [employer] to ship a 14 day ZIO XT long term holter monitor to her home.

## 2019-10-02 DIAGNOSIS — E782 Mixed hyperlipidemia: Secondary | ICD-10-CM | POA: Diagnosis not present

## 2019-10-02 DIAGNOSIS — I10 Essential (primary) hypertension: Secondary | ICD-10-CM | POA: Diagnosis not present

## 2019-10-02 DIAGNOSIS — I2510 Atherosclerotic heart disease of native coronary artery without angina pectoris: Secondary | ICD-10-CM | POA: Diagnosis not present

## 2019-10-02 DIAGNOSIS — M67442 Ganglion, left hand: Secondary | ICD-10-CM | POA: Diagnosis not present

## 2019-10-02 DIAGNOSIS — K130 Diseases of lips: Secondary | ICD-10-CM | POA: Diagnosis not present

## 2019-10-02 DIAGNOSIS — R739 Hyperglycemia, unspecified: Secondary | ICD-10-CM | POA: Diagnosis not present

## 2019-10-02 DIAGNOSIS — Z1211 Encounter for screening for malignant neoplasm of colon: Secondary | ICD-10-CM | POA: Diagnosis not present

## 2019-10-02 DIAGNOSIS — H9193 Unspecified hearing loss, bilateral: Secondary | ICD-10-CM | POA: Diagnosis not present

## 2019-10-02 DIAGNOSIS — M1710 Unilateral primary osteoarthritis, unspecified knee: Secondary | ICD-10-CM | POA: Diagnosis not present

## 2019-10-02 DIAGNOSIS — Z789 Other specified health status: Secondary | ICD-10-CM | POA: Diagnosis not present

## 2019-10-02 DIAGNOSIS — R002 Palpitations: Secondary | ICD-10-CM | POA: Diagnosis not present

## 2019-10-04 DIAGNOSIS — H25093 Other age-related incipient cataract, bilateral: Secondary | ICD-10-CM | POA: Diagnosis not present

## 2019-10-04 DIAGNOSIS — M1710 Unilateral primary osteoarthritis, unspecified knee: Secondary | ICD-10-CM | POA: Diagnosis not present

## 2019-10-04 DIAGNOSIS — E782 Mixed hyperlipidemia: Secondary | ICD-10-CM | POA: Diagnosis not present

## 2019-10-04 DIAGNOSIS — I2510 Atherosclerotic heart disease of native coronary artery without angina pectoris: Secondary | ICD-10-CM | POA: Diagnosis not present

## 2019-10-04 DIAGNOSIS — I10 Essential (primary) hypertension: Secondary | ICD-10-CM | POA: Diagnosis not present

## 2019-10-04 DIAGNOSIS — G459 Transient cerebral ischemic attack, unspecified: Secondary | ICD-10-CM | POA: Diagnosis not present

## 2019-10-04 DIAGNOSIS — M1990 Unspecified osteoarthritis, unspecified site: Secondary | ICD-10-CM | POA: Diagnosis not present

## 2019-10-04 DIAGNOSIS — J4541 Moderate persistent asthma with (acute) exacerbation: Secondary | ICD-10-CM | POA: Diagnosis not present

## 2019-10-06 ENCOUNTER — Ambulatory Visit (INDEPENDENT_AMBULATORY_CARE_PROVIDER_SITE_OTHER): Payer: Medicare Other

## 2019-10-06 DIAGNOSIS — R002 Palpitations: Secondary | ICD-10-CM | POA: Diagnosis not present

## 2019-10-09 DIAGNOSIS — Z1211 Encounter for screening for malignant neoplasm of colon: Secondary | ICD-10-CM | POA: Diagnosis not present

## 2019-10-09 DIAGNOSIS — I10 Essential (primary) hypertension: Secondary | ICD-10-CM | POA: Diagnosis not present

## 2019-10-09 DIAGNOSIS — R739 Hyperglycemia, unspecified: Secondary | ICD-10-CM | POA: Diagnosis not present

## 2019-10-09 DIAGNOSIS — M1710 Unilateral primary osteoarthritis, unspecified knee: Secondary | ICD-10-CM | POA: Diagnosis not present

## 2019-10-09 DIAGNOSIS — I2510 Atherosclerotic heart disease of native coronary artery without angina pectoris: Secondary | ICD-10-CM | POA: Diagnosis not present

## 2019-10-09 DIAGNOSIS — E782 Mixed hyperlipidemia: Secondary | ICD-10-CM | POA: Diagnosis not present

## 2019-10-09 DIAGNOSIS — R002 Palpitations: Secondary | ICD-10-CM | POA: Diagnosis not present

## 2019-10-09 DIAGNOSIS — M67442 Ganglion, left hand: Secondary | ICD-10-CM | POA: Diagnosis not present

## 2019-10-09 DIAGNOSIS — K130 Diseases of lips: Secondary | ICD-10-CM | POA: Diagnosis not present

## 2019-10-09 DIAGNOSIS — Z789 Other specified health status: Secondary | ICD-10-CM | POA: Diagnosis not present

## 2019-10-09 DIAGNOSIS — H9193 Unspecified hearing loss, bilateral: Secondary | ICD-10-CM | POA: Diagnosis not present

## 2019-10-24 DIAGNOSIS — R2230 Localized swelling, mass and lump, unspecified upper limb: Secondary | ICD-10-CM | POA: Diagnosis not present

## 2019-10-24 DIAGNOSIS — R2232 Localized swelling, mass and lump, left upper limb: Secondary | ICD-10-CM | POA: Diagnosis not present

## 2019-11-02 ENCOUNTER — Telehealth: Payer: Self-pay | Admitting: *Deleted

## 2019-11-02 DIAGNOSIS — R002 Palpitations: Secondary | ICD-10-CM | POA: Diagnosis not present

## 2019-11-02 NOTE — Telephone Encounter (Signed)
Spoke with [place] with [employer] re critical findings on Zio patch after 13 days of monitoring  Monitor shows second degree heart block ,3 sec pause, vtach, and bradycardia .Discussed with Dr Gwenlyn Found no changes at this time but pt needs to come in for visit in a couple of weeks Appt  Made for 11/15/19 at 3:30 pm.Pt aware of findings and recommendations./cy

## 2019-11-15 ENCOUNTER — Encounter: Payer: Self-pay | Admitting: Cardiovascular Disease

## 2019-11-15 ENCOUNTER — Ambulatory Visit (INDEPENDENT_AMBULATORY_CARE_PROVIDER_SITE_OTHER): Payer: Medicare Other | Admitting: Cardiovascular Disease

## 2019-11-15 ENCOUNTER — Other Ambulatory Visit: Payer: Self-pay

## 2019-11-15 VITALS — BP 154/84 | HR 60 | Ht 65.5 in | Wt 122.2 lb

## 2019-11-15 DIAGNOSIS — I6522 Occlusion and stenosis of left carotid artery: Secondary | ICD-10-CM

## 2019-11-15 DIAGNOSIS — R002 Palpitations: Secondary | ICD-10-CM | POA: Diagnosis not present

## 2019-11-15 NOTE — Patient Instructions (Signed)

## 2019-11-15 NOTE — Progress Notes (Signed)
Ms. Mccarver returns today for follow-up of her event monitor which I obtain because of increasing heart rates which she noticed while she was taking her blood pressure.  This showed predominantly sinus rhythm with episodes of sinus bradycardia, occasional PACs and PVCs, pauses up to 3 seconds although she is asymptomatic and short runs of PSVT and nonsustained VT.  Overall, she is not bothered by any of these rhythm abnormalities.  Importantly, she has no evidence of A. fib on her monitor.  I reassured her and told her that we are not going to treat any of these arrhythmias.  I will see her back in 1 year for follow-up.  Lorretta Harp, M.D., [place], [facility], [place] [address] [place], [place]  [address]  [phone] 11/15/2019 4:02 PM

## 2019-11-21 DIAGNOSIS — R2232 Localized swelling, mass and lump, left upper limb: Secondary | ICD-10-CM | POA: Diagnosis not present

## 2019-12-04 ENCOUNTER — Ambulatory Visit: Payer: Medicare Other | Admitting: Orthotics

## 2019-12-04 ENCOUNTER — Ambulatory Visit (INDEPENDENT_AMBULATORY_CARE_PROVIDER_SITE_OTHER): Payer: Medicare Other | Admitting: Podiatry

## 2019-12-04 ENCOUNTER — Other Ambulatory Visit: Payer: Self-pay

## 2019-12-04 DIAGNOSIS — M779 Enthesopathy, unspecified: Secondary | ICD-10-CM

## 2019-12-04 DIAGNOSIS — M7989 Other specified soft tissue disorders: Secondary | ICD-10-CM | POA: Diagnosis not present

## 2019-12-04 DIAGNOSIS — M722 Plantar fascial fibromatosis: Secondary | ICD-10-CM

## 2019-12-04 DIAGNOSIS — I6522 Occlusion and stenosis of left carotid artery: Secondary | ICD-10-CM | POA: Diagnosis not present

## 2019-12-04 DIAGNOSIS — M19079 Primary osteoarthritis, unspecified ankle and foot: Secondary | ICD-10-CM

## 2019-12-04 NOTE — Progress Notes (Signed)
Lower arch of left 1/4"

## 2019-12-06 NOTE — Progress Notes (Signed)
Subjective: 84 year old female presents the office today for evaluation of left foot pain.  She said that she has inserts but she has followed up with Liliane Channel today and they are being redone as she felt the arch was too high needing adjustment.  She states that she has fungus on her nails and has become thickened discolored.  She has had swelling to the foot intermittently due to the arthritis.  She also has plantar fasciitis that this is been doing well. Denies any systemic complaints such as fevers, chills, nausea, vomiting. No acute changes since last appointment, and no other complaints at this time.   Objective: AAO x3, NAD DP/PT pulses palpable bilaterally, CRT less than 3 seconds Flatfoot is evident with midfoot arthritic changes present noticed when she has majority discomfort.  Mild swelling to the foot there is no erythema or warmth.  The nails appear to be hypertrophic, dystrophic with yellow-brown discoloration.  No swelling or redness of the toenail sites.  No open lesions. Skin on the leg is very thin and able to visualize the veins. No open lesions or pre-ulcerative lesions.  No pain with calf compression, swelling, warmth, erythema  Assessment: 84 year old female with midfoot arthritis left foot, chronic swelling to the foot with onychomycosis  Plan: -All treatment options discussed with the patient including all alternatives, risks, complications.  -Continue with orthotics and supportive shoes.  She can use Voltaren gel as needed.  Debrided the nails O47 with any complications as a courtesy.  Awaiting new orthotic adjustments.  Discussed compression socks help with swelling. -Patient encouraged to call the office with any questions, concerns, change in symptoms.   Kendra [name] DPM

## 2019-12-19 DIAGNOSIS — G459 Transient cerebral ischemic attack, unspecified: Secondary | ICD-10-CM | POA: Diagnosis not present

## 2019-12-19 DIAGNOSIS — J4541 Moderate persistent asthma with (acute) exacerbation: Secondary | ICD-10-CM | POA: Diagnosis not present

## 2019-12-19 DIAGNOSIS — I10 Essential (primary) hypertension: Secondary | ICD-10-CM | POA: Diagnosis not present

## 2019-12-19 DIAGNOSIS — M1990 Unspecified osteoarthritis, unspecified site: Secondary | ICD-10-CM | POA: Diagnosis not present

## 2019-12-19 DIAGNOSIS — M1710 Unilateral primary osteoarthritis, unspecified knee: Secondary | ICD-10-CM | POA: Diagnosis not present

## 2019-12-19 DIAGNOSIS — H25093 Other age-related incipient cataract, bilateral: Secondary | ICD-10-CM | POA: Diagnosis not present

## 2019-12-19 DIAGNOSIS — Z1211 Encounter for screening for malignant neoplasm of colon: Secondary | ICD-10-CM | POA: Diagnosis not present

## 2019-12-19 DIAGNOSIS — E782 Mixed hyperlipidemia: Secondary | ICD-10-CM | POA: Diagnosis not present

## 2019-12-19 DIAGNOSIS — Z1212 Encounter for screening for malignant neoplasm of rectum: Secondary | ICD-10-CM | POA: Diagnosis not present

## 2019-12-19 DIAGNOSIS — I2510 Atherosclerotic heart disease of native coronary artery without angina pectoris: Secondary | ICD-10-CM | POA: Diagnosis not present

## 2019-12-23 LAB — COLOGUARD: COLOGUARD: NEGATIVE

## 2019-12-27 ENCOUNTER — Other Ambulatory Visit: Payer: Self-pay | Admitting: Family Medicine

## 2019-12-27 DIAGNOSIS — Z1231 Encounter for screening mammogram for malignant neoplasm of breast: Secondary | ICD-10-CM

## 2020-01-02 DIAGNOSIS — J4541 Moderate persistent asthma with (acute) exacerbation: Secondary | ICD-10-CM | POA: Diagnosis not present

## 2020-01-02 DIAGNOSIS — G459 Transient cerebral ischemic attack, unspecified: Secondary | ICD-10-CM | POA: Diagnosis not present

## 2020-01-02 DIAGNOSIS — L659 Nonscarring hair loss, unspecified: Secondary | ICD-10-CM | POA: Diagnosis not present

## 2020-01-02 DIAGNOSIS — H25093 Other age-related incipient cataract, bilateral: Secondary | ICD-10-CM | POA: Diagnosis not present

## 2020-01-02 DIAGNOSIS — R739 Hyperglycemia, unspecified: Secondary | ICD-10-CM | POA: Diagnosis not present

## 2020-01-02 DIAGNOSIS — I2510 Atherosclerotic heart disease of native coronary artery without angina pectoris: Secondary | ICD-10-CM | POA: Diagnosis not present

## 2020-01-02 DIAGNOSIS — I10 Essential (primary) hypertension: Secondary | ICD-10-CM | POA: Diagnosis not present

## 2020-01-02 DIAGNOSIS — R6889 Other general symptoms and signs: Secondary | ICD-10-CM | POA: Diagnosis not present

## 2020-01-02 DIAGNOSIS — M1710 Unilateral primary osteoarthritis, unspecified knee: Secondary | ICD-10-CM | POA: Diagnosis not present

## 2020-01-02 DIAGNOSIS — L989 Disorder of the skin and subcutaneous tissue, unspecified: Secondary | ICD-10-CM | POA: Diagnosis not present

## 2020-01-02 DIAGNOSIS — E782 Mixed hyperlipidemia: Secondary | ICD-10-CM | POA: Diagnosis not present

## 2020-01-02 DIAGNOSIS — M1990 Unspecified osteoarthritis, unspecified site: Secondary | ICD-10-CM | POA: Diagnosis not present

## 2020-01-03 DIAGNOSIS — R739 Hyperglycemia, unspecified: Secondary | ICD-10-CM | POA: Diagnosis not present

## 2020-01-03 DIAGNOSIS — L989 Disorder of the skin and subcutaneous tissue, unspecified: Secondary | ICD-10-CM | POA: Diagnosis not present

## 2020-01-03 DIAGNOSIS — H25093 Other age-related incipient cataract, bilateral: Secondary | ICD-10-CM | POA: Diagnosis not present

## 2020-01-03 DIAGNOSIS — I2510 Atherosclerotic heart disease of native coronary artery without angina pectoris: Secondary | ICD-10-CM | POA: Diagnosis not present

## 2020-01-03 DIAGNOSIS — M1990 Unspecified osteoarthritis, unspecified site: Secondary | ICD-10-CM | POA: Diagnosis not present

## 2020-01-03 DIAGNOSIS — L659 Nonscarring hair loss, unspecified: Secondary | ICD-10-CM | POA: Diagnosis not present

## 2020-01-03 DIAGNOSIS — G459 Transient cerebral ischemic attack, unspecified: Secondary | ICD-10-CM | POA: Diagnosis not present

## 2020-01-03 DIAGNOSIS — J4541 Moderate persistent asthma with (acute) exacerbation: Secondary | ICD-10-CM | POA: Diagnosis not present

## 2020-01-03 DIAGNOSIS — I10 Essential (primary) hypertension: Secondary | ICD-10-CM | POA: Diagnosis not present

## 2020-01-03 DIAGNOSIS — R6889 Other general symptoms and signs: Secondary | ICD-10-CM | POA: Diagnosis not present

## 2020-01-03 DIAGNOSIS — E782 Mixed hyperlipidemia: Secondary | ICD-10-CM | POA: Diagnosis not present

## 2020-01-03 DIAGNOSIS — M1710 Unilateral primary osteoarthritis, unspecified knee: Secondary | ICD-10-CM | POA: Diagnosis not present

## 2020-01-08 DIAGNOSIS — Z961 Presence of intraocular lens: Secondary | ICD-10-CM | POA: Diagnosis not present

## 2020-01-08 DIAGNOSIS — H40013 Open angle with borderline findings, low risk, bilateral: Secondary | ICD-10-CM | POA: Diagnosis not present

## 2020-01-08 DIAGNOSIS — H04123 Dry eye syndrome of bilateral lacrimal glands: Secondary | ICD-10-CM | POA: Diagnosis not present

## 2020-01-08 DIAGNOSIS — H26493 Other secondary cataract, bilateral: Secondary | ICD-10-CM | POA: Diagnosis not present

## 2020-01-15 ENCOUNTER — Ambulatory Visit: Payer: Medicare Other | Admitting: Orthotics

## 2020-01-15 ENCOUNTER — Other Ambulatory Visit: Payer: Self-pay

## 2020-01-15 DIAGNOSIS — M722 Plantar fascial fibromatosis: Secondary | ICD-10-CM

## 2020-01-15 DIAGNOSIS — M779 Enthesopathy, unspecified: Secondary | ICD-10-CM

## 2020-01-15 NOTE — Progress Notes (Signed)
Picked up f/o left lowered 1/4" pleased.

## 2020-01-16 DIAGNOSIS — M545 Low back pain: Secondary | ICD-10-CM | POA: Diagnosis not present

## 2020-02-02 DIAGNOSIS — G459 Transient cerebral ischemic attack, unspecified: Secondary | ICD-10-CM | POA: Diagnosis not present

## 2020-02-02 DIAGNOSIS — E782 Mixed hyperlipidemia: Secondary | ICD-10-CM | POA: Diagnosis not present

## 2020-02-02 DIAGNOSIS — M1710 Unilateral primary osteoarthritis, unspecified knee: Secondary | ICD-10-CM | POA: Diagnosis not present

## 2020-02-02 DIAGNOSIS — I2510 Atherosclerotic heart disease of native coronary artery without angina pectoris: Secondary | ICD-10-CM | POA: Diagnosis not present

## 2020-02-02 DIAGNOSIS — I10 Essential (primary) hypertension: Secondary | ICD-10-CM | POA: Diagnosis not present

## 2020-02-02 DIAGNOSIS — M1990 Unspecified osteoarthritis, unspecified site: Secondary | ICD-10-CM | POA: Diagnosis not present

## 2020-02-02 DIAGNOSIS — J4541 Moderate persistent asthma with (acute) exacerbation: Secondary | ICD-10-CM | POA: Diagnosis not present

## 2020-02-02 DIAGNOSIS — H25093 Other age-related incipient cataract, bilateral: Secondary | ICD-10-CM | POA: Diagnosis not present

## 2020-02-07 ENCOUNTER — Other Ambulatory Visit: Payer: Self-pay

## 2020-02-07 ENCOUNTER — Ambulatory Visit
Admission: RE | Admit: 2020-02-07 | Discharge: 2020-02-07 | Disposition: A | Discharge status: Home / Self Care | Payer: Medicare Other | Source: Ambulatory Visit | Attending: Family Medicine | Admitting: Family Medicine

## 2020-02-07 DIAGNOSIS — Z1231 Encounter for screening mammogram for malignant neoplasm of breast: Secondary | ICD-10-CM

## 2020-02-20 ENCOUNTER — Telehealth: Payer: Self-pay | Admitting: Cardiovascular Disease

## 2020-02-20 NOTE — Telephone Encounter (Signed)
Pt c/o BP issue: STAT if pt c/o blurred vision, one-sided weakness or slurred speech  1. What are your last 5 BP readings?  9/14 830am 155/70 HR 53  430am 173/74 HR 56 9/13 2pm 119/55 HR 46 irregular heartbeat her BP cuff said 9/12  176/89 HR 52 9/10  151/93 HR 70 irregular heartbeat her BP cuff said Week before 189/97 HR 77 irregular heartbeat her BP cuff said 8/29  176/113 HR 132 irregular heartbeat her BP cuff said  2. Are you having any other symptoms (ex. Dizziness, headache, blurred vision, passed out)? States she doesn't feel right.   3. What is your BP issue? Her BP has been up HR has been lower than normal and irregular. She is not sure if she needs a medication adjustment.  She stated if she doesn't pick up phone it is ok to leave a voice mail.

## 2020-02-20 NOTE — Telephone Encounter (Signed)
Called and spoke to patient. She states that she has had some elevated BPs at night with SBPs in the 170s (see readings below). She states that she uses her PRN clonidine and it comes down. She is also on losartan 50 mg BID. The patient also states that she has had some lower HRs in the mid 45-50s on the lower end. These rates are consistent with what is noted in her chart. She had some higher ones in the 130s. She states that the higher readings are short lived. She states that she does have some dizziness at time with her lower HRs.  Denies syncope, SOB, chest pain, or any other Sx. Patient had a monitor in 10/2019 that showed SB (as low as the 30s)/ST, occasional PVCs/PACs, pauses up to 3 sec, PSVT and short runs of NSVT. No atrial fibrillation. Instructed the patient to continue to monitor and let us know if her Sx change or worsen. She verbalized understanding and thanked me for the call.

## 2020-02-23 ENCOUNTER — Telehealth: Payer: Self-pay | Admitting: Cardiovascular Disease

## 2020-02-23 ENCOUNTER — Other Ambulatory Visit: Payer: Medicare Other | Admitting: Orthotics

## 2020-02-23 NOTE — Telephone Encounter (Signed)
Discussed with Dr Oval Linsey and she recommended patient start Amlodipine 5 mg daily Called patient and she stated Dr Gwenlyn Found d/c Amlodipine secondary to "shivers" Does take Hydralazine 25 mg 1/2 tablet 3 times a day, full tablet didn't "agree" with her Will discuss further with Dr Oval Linsey Monday since Dr Gwenlyn Found not back in office until Tuesday

## 2020-02-23 NOTE — Telephone Encounter (Addendum)
Spoke to pt who report she called on Tuesday and haven't received a call back ( see [place] message below). Pt voiced she continues to have fluctuating BP. This morning around 9 am BP was 174/86 HR 57. Pt took PRN clonidine but hadn't rechecked. Nurse had pt recheck while on the phone and report 164/69 HR 50. Pt state earlier this morning she was feeling dizzy and light headed. She denies symptoms currently but also wanted to make MD aware that she has occasional episodes of numbness and tingling in her arms and legs.    Brittany's message from 9/14  Called and spoke to patient. She states that she has had some elevated BPs at night with SBPs in the 170s (see readings below). She states that she uses her PRN clonidine and it comes down. She is also on losartan 50 mg BID. The patient also states that she has had some lower HRs in the mid 45-50s on the lower end. These rates are consistent with what is noted in her chart. She had some higher ones in the 130s. She states that the higher readings are short lived. She states that she does have some dizziness at time with her lower HRs.  Denies syncope, SOB, chest pain, or any other Sx. Patient had a monitor in 10/2019 that showed SB (as low as the 30s)/ST, occasional PVCs/PACs, pauses up to 3 sec, PSVT and short runs of NSVT. No atrial fibrillation. Instructed the patient to continue to monitor and let us know if her Sx change or worsen. She verbalized understanding and thanked me for the call.    Will forward to MD

## 2020-02-23 NOTE — Telephone Encounter (Signed)
New message    Patient states that she has been waiting for a callback since Tuesday.  She states that she is now feeling lightheaded and still has all of the symptoms she had on Tuesday.  Her bp reading today at 9am was 174/86 HR 57.  Patient states that her carotid artery is partially blocked and she is concerned and may need to be seen.  Please advise.

## 2020-02-26 DIAGNOSIS — H0102A Squamous blepharitis right eye, upper and lower eyelids: Secondary | ICD-10-CM | POA: Diagnosis not present

## 2020-02-26 DIAGNOSIS — H0102B Squamous blepharitis left eye, upper and lower eyelids: Secondary | ICD-10-CM | POA: Diagnosis not present

## 2020-02-26 NOTE — Telephone Encounter (Signed)
Have her keep a 30-day blood pressure log and see a Pharm.D. after that to review make changes

## 2020-02-26 NOTE — Telephone Encounter (Signed)
Patient was not happy with Dr Kennon Holter recommendation, scheduled Pharm D appointment for tomorrow.

## 2020-02-27 ENCOUNTER — Ambulatory Visit: Payer: Medicare Other | Admitting: Orthotics

## 2020-02-27 ENCOUNTER — Other Ambulatory Visit: Payer: Self-pay

## 2020-02-27 DIAGNOSIS — M779 Enthesopathy, unspecified: Secondary | ICD-10-CM

## 2020-02-27 DIAGNOSIS — M19079 Primary osteoarthritis, unspecified ankle and foot: Secondary | ICD-10-CM

## 2020-02-27 DIAGNOSIS — M722 Plantar fascial fibromatosis: Secondary | ICD-10-CM

## 2020-02-27 NOTE — Progress Notes (Signed)
Excavated material out of arch to make more flexible.  She seemed pleased, but the acid test will be a 2 mile walk

## 2020-02-29 ENCOUNTER — Ambulatory Visit (INDEPENDENT_AMBULATORY_CARE_PROVIDER_SITE_OTHER): Payer: Medicare Other | Admitting: Pharmacist Clinician (PhC)/ Clinical Pharmacy Specialist

## 2020-02-29 ENCOUNTER — Other Ambulatory Visit: Payer: Self-pay

## 2020-02-29 DIAGNOSIS — I6522 Occlusion and stenosis of left carotid artery: Secondary | ICD-10-CM | POA: Diagnosis not present

## 2020-02-29 DIAGNOSIS — I10 Essential (primary) hypertension: Secondary | ICD-10-CM | POA: Diagnosis not present

## 2020-02-29 MED ORDER — VALSARTAN 160 MG PO TABS: 160.0000 mg | ORAL_TABLET | Freq: Every day | ORAL | 5 refills | Status: DC

## 2020-02-29 NOTE — Patient Instructions (Addendum)
Return for a a follow up appointment with PA on October 6  Check your blood pressure at home daily and keep record of the readings.  Take your BP meds as follows:  Stop losartan.  Start valsartan 160 mg once daily   Continue with all other medications  Bring all of your meds, your BP cuff and your record of home blood pressures to your next appointment.  Exercise as you're able, try to walk approximately 30 minutes per day.  Keep salt intake to a minimum, especially watch canned and prepared boxed foods.  Eat more fresh fruits and vegetables and fewer canned items.  Avoid eating in fast food restaurants.    HOW TO TAKE YOUR BLOOD PRESSURE: . Rest 5 minutes before taking your blood pressure. .  Don't smoke or drink caffeinated beverages for at least 30 minutes before. . Take your blood pressure before (not after) you eat. . Sit comfortably with your back supported and both feet on the floor (don't cross your legs). . Elevate your arm to heart level on a table or a desk. . Use the proper sized cuff. It should fit smoothly and snugly around your bare upper arm. There should be enough room to slip a fingertip under the cuff. The bottom edge of the cuff should be 1 inch above the crease of the elbow. . Ideally, take 3 measurements at one sitting and record the average.

## 2020-02-29 NOTE — Progress Notes (Signed)
03/05/2020 Kendra [name] [birth year] [id number]   HPI:  Kendra [name] is a 84 y.o. female patient of Dr Gwenlyn Found, with a [place] below who presents today for hypertension clinic evaluation. When she saw Dr. Gwenlyn Found in June at which time her pressure was elevated at 154/84, she noted that her pressure was spiking during the middle of the night, but she did not bring any home readings in to show.    Today she reports that she continues to have elevated blood pressures between 2-4 am.  During daylight hours her average is much better (see below).  She notes that with these elevated readings in the middle of the night she feels dizziness and elevated heart rates.  Believes she may have had a syncopal episode one of these nights, as she recalls sitting on the bathroom floor, but not sure how she came to be sitting.  Previously was on hydralazine 25 mg dose, but reports that this was "too much", so now takes 12.5 mg tid.    Past Medical History: ASCVD 2 overlapping stents to LAD 2008; left ICA 40-59% stenosis  hyperlipidemia LDL 82 (7/21) on rosuvastatin 5 mg tiw     Blood Pressure Goal:  130/80  Current Medications: losartan 50 mg bid, hydralazine 12.5 mg tid, clonidine 0.1 mg prn SBP > 170 (am 10-11 am, mid day around 4:30-5 pm 10-11 pm)  Family Hx: father died from CHF, DM2 at 55; mother had 3 MI (28-75 yrs), died at 1; 1 sister with elevated cholesterol; daughter is naturopath, no indication of heart issues  Social Hx: no (quit 54 tyrs ago), no alcohol, coffee regularly (1/2 decaf) 2 daily ([employer]bucks), decaf in afternoon, occasional tea  Diet: vegetarian; eats carry out maybe 1-2 times per week ([place], [place], [place] type); some heat 'n eat, some from scratch; Breakfast is organic cereal; protein consists nuts (almonds, walnuts); almond cheese, oat cheese, non-dairy cheeses, plenty of beans (garbazno, lentil), salad with beans added; veggie burgers; chickpea  pasta; no added salt  Exercise: walks in neighborhood regularly 1-1.5 miles (took tai chi before covid - still doing, although only 15-20 min couple of times per week)  Home BP readings: has been checking up to 4 times a day, including 2-3 am.  Readings between 7-11 am (23) average 144/67.  However between 2-4 am the readings (12) averaged 181/84.    Intolerances: amlodipine - shivers; statins - myalgias (low dose tiw rosuva ok)  Labs:  7/21: Na 138; K 4.5; Glu 117; BUN 14; SCr 0.63  Wt Readings from Last 3 Encounters:  02/29/20 125 lb (56.7 kg)  11/15/19 122 lb 3.2 oz (55.4 kg)  09/27/19 123 lb 6.4 oz (56 kg)   BP Readings from Last 3 Encounters:  02/29/20 (!) 164/78  11/15/19 (!) 154/84  09/27/19 138/82   Pulse Readings from Last 3 Encounters:  02/29/20 (!) 52  11/15/19 60  09/27/19 (!) 50    Current Outpatient Medications  Medication Sig Dispense Refill   albuterol (PROVENTIL HFA;VENTOLIN HFA) 108 (90 BASE) MCG/ACT inhaler Inhale 1-2 puffs into the lungs every 12 (twelve) hours as needed for wheezing or shortness of breath.      beclomethasone (QVAR) 80 MCG/ACT inhaler Inhale 2 puffs into the lungs 2 (two) times daily.     BIOTIN FORTE PO Take 1 capsule by mouth daily.     BLACK ELDERBERRY PO Take by mouth.     calcium carbonate (OS-CAL - DOSED IN MG  OF ELEMENTAL CALCIUM) 1250 (500 Ca) MG tablet Take 1 tablet by mouth daily.     cholecalciferol (VITAMIN D) 1000 UNITS tablet Take 1,000 Units by mouth daily.     clobetasol (OLUX) 0.05 % topical foam Apply 1 application topically every 3 (three) days.     cloNIDine (CATAPRES) 0.1 MG tablet Take 0.1 mg by mouth daily as needed (high BP > 170).      Coenzyme Q10 (CO Q-10) 100 MG CAPS Take 100 mg by mouth daily.      hydrALAZINE (APRESOLINE) 25 MG tablet Take 25 mg by mouth as directed. 1/2 tablet 3 times a day     Multiple Vitamin (MULTIVITAMIN) capsule Take 1 capsule by mouth daily.     rosuvastatin (CRESTOR) 5 MG  tablet Take 5 mg by mouth every other day. On Mondays, Wednesdays, and Saturdays     valsartan (DIOVAN) 160 MG tablet Take 1 tablet (160 mg total) by mouth daily. 30 tablet 5   No current facility-administered medications for this visit.    Allergies  Allergen Reactions   Other     Other reaction(s): Respiratory Distress   Amlodipine     "shivers"    Ciprofloxacin    Dog Epithelium Allergy Skin Test    Latex Other (See Comments)    Irritates her skin   Statins     Unable to tolerate statins w the exception of crestor.   Sulfa Antibiotics Other (See Comments)    "didnt feel good"   Sulfamethoxazole Other (See Comments)    Makes her feel bad   Zetia [Ezetimibe] Other (See Comments)    "makes me constipated"   Penicillins Rash    Past Medical History:  Diagnosis Date   Asthma    CAD (coronary artery disease)    last cath 08/2006   Carotid artery disease (HCC)    moderate left ICA stenosis by 2.6 ultrasound   DJD (degenerative joint disease), lumbar    Hearing loss    HTN (hypertension)    Hyperlipidemia    Vision loss of right eye     Blood pressure (!) 164/78, pulse (!) 52, temperature 98.1 F (36.7 C), resp. rate 16, height 5' 5"  (1.651 m), weight 125 lb (56.7 kg), SpO2 97 %.  Essential hypertension Patient with essential hypertension currently not well controlled.  Will have her switch losartan 100 mg to valsartan 160 mg, with the thought of increasing to 320 mg at next visit should pressure still be elevated.  She has an appointment with Sande Rives PA in early October and we can see her after that should she need further management.     Tommy Medal PharmD CPP [facility] [address] [place], [place] [address] [phone]

## 2020-03-01 ENCOUNTER — Other Ambulatory Visit: Payer: Medicare Other | Admitting: Orthotics

## 2020-03-03 DIAGNOSIS — I10 Essential (primary) hypertension: Secondary | ICD-10-CM | POA: Diagnosis not present

## 2020-03-03 DIAGNOSIS — G459 Transient cerebral ischemic attack, unspecified: Secondary | ICD-10-CM | POA: Diagnosis not present

## 2020-03-03 DIAGNOSIS — E782 Mixed hyperlipidemia: Secondary | ICD-10-CM | POA: Diagnosis not present

## 2020-03-03 DIAGNOSIS — M1990 Unspecified osteoarthritis, unspecified site: Secondary | ICD-10-CM | POA: Diagnosis not present

## 2020-03-03 DIAGNOSIS — J4541 Moderate persistent asthma with (acute) exacerbation: Secondary | ICD-10-CM | POA: Diagnosis not present

## 2020-03-03 DIAGNOSIS — I2510 Atherosclerotic heart disease of native coronary artery without angina pectoris: Secondary | ICD-10-CM | POA: Diagnosis not present

## 2020-03-03 DIAGNOSIS — M1710 Unilateral primary osteoarthritis, unspecified knee: Secondary | ICD-10-CM | POA: Diagnosis not present

## 2020-03-03 DIAGNOSIS — H25093 Other age-related incipient cataract, bilateral: Secondary | ICD-10-CM | POA: Diagnosis not present

## 2020-03-04 DIAGNOSIS — Z23 Encounter for immunization: Secondary | ICD-10-CM | POA: Diagnosis not present

## 2020-03-05 ENCOUNTER — Encounter: Payer: Self-pay | Admitting: Pharmacist Clinician (PhC)/ Clinical Pharmacy Specialist

## 2020-03-05 NOTE — Assessment & Plan Note (Signed)
Patient with essential hypertension currently not well controlled.  Will have her switch losartan 100 mg to valsartan 160 mg, with the thought of increasing to 320 mg at next visit should pressure still be elevated.  She has an appointment with Sande Rives PA in early October and we can see her after that should she need further management.

## 2020-03-06 NOTE — Progress Notes (Signed)
Cardiology Office Note:    Date:  03/13/2020   ID:  Kendra [name], DOB [birth year], MRN [id number]  PCP:  Vernie Shanks, MD  Cardiologist:  Quay Burow, MD  Electrophysiologist:  None   Referring MD: Vernie Shanks, MD   Chief Complaint: follow-up of hypertension  History of Present Illness:    Kendra [name] is a 84 y.o. female with a history of CAD s/p 2 overlapping stents to LAD in 08/2006 at the [facility], bilateral carotid artery disease with 1-39% stenosis of right ICA and 40-59% stenosis of left ICA on dopplers in 06/2019, hypertension, hyperlipidemia, and asthma who is followed by Dr. Gwenlyn Found and presents today for follow-up of hypertension.   Patient was seen by Dr. Gwenlyn Found in 09/2019 at which time she did noticed that her heart rate was going up sometimes. Patient denied any other cardiac symptoms. 2-week Zio Monitor was ordered and showed underlying sinus rhythm with some sinus bradycardia (with rates as low as the 30's) as well as some sinus tachycardia. Also showed occasional PACs/PVCs, paroxysmal SVT, short runs of non-sustained VT, and pauses up to 3 seconds. No evidence of atrial fibrillation. He was seen back by Dr. Gwenlyn Found for follow-up in 11/2019 at which time patient reported not being overall bothered by these rhythm abnormalities. No medication adjustments were felt to be needed.   Patient called our office on 02/20/2020 with concerns about her BP spiking in the middle of the night with systolic BP as high as the 170's (improves with PRN Clonidine). She also expressed some concern about lower heart rates with some associated dizziness with this. She was seen in our PharmD Hypertension clinic on 02/29/2020 for further evaluation of this. At that visit, she continued to express concern over BP spiking in the middle of the night. She reported that with the elevated reading in the middle of the night she also had some elevated heart rates and some associated dizziness  with one questionable episode of possible syncope. Her Losartan was switched to Valsartan 160mg  daily for better BP control. Per phone note on 03/07/2020, BP was better on the Valsartan but still not lasting the whole day. Therefore, PharmD recommended increasing Valsartan to 160mg  twice daily. She was instructed to take Hydralazine 25mg  once daily in the evening for the next 3 days and then stop this as well. PharmD recommended not making any additional changes for at least 2 weeks after this if patient is tolerating well.  Patient presents today for follow-up. Patient brings great log of BP and heart rate with her today which I reviewed. It looks like BP spikes have leveled off some but she still has had a couple of spikes as high as 207/103 which actually occurred last night. Systolic BP mostly ranges from 120 's to 150's. Hear rates in the 40's to 60's.  Will scan BP log into the system. She notes symptoms of lightheadedness/dizzy when her BP spikes very high and when her heart rates is irregular but states she does not have as many symptoms when her heart rate is regular. She does state that she can feel it when her heart is beating irregularly. She does clarifying the episodes of possible syncope that was mentioned at last PharmD visit. She states that she was feeling so dizzy and nausea that evening that she felt like she was going to pass out so she sat down on the floor but is adamant that she did not pass out. Her BP was  176/113 at that time. Patient denies any chest pain or shortness of breath. She notes some chronic swelling of her left ankle/foot but this is stable.  Patient was wondering if she should have a repeat stress test given her history of CAD.  Past Medical History:  Diagnosis Date   Asthma    CAD (coronary artery disease)    last cath 08/2006   Carotid artery disease (HCC)    moderate left ICA stenosis by 2.6 ultrasound   DJD (degenerative joint disease), lumbar    Hearing  loss    HTN (hypertension)    Hyperlipidemia    Vision loss of right eye     Past Surgical History:  Procedure Laterality Date   ABDOMINAL HYSTERECTOMY  1975   APPENDECTOMY     CORONARY ANGIOPLASTY WITH STENT PLACEMENT  08/2006   taxus stent x2 to LAD, 2.5x57mm and 2.5x47mm; done at [facility] in [place]    Current Medications: Current Meds  Medication Sig   albuterol (PROVENTIL HFA;VENTOLIN HFA) 108 (90 BASE) MCG/ACT inhaler Inhale 1-2 puffs into the lungs every 12 (twelve) hours as needed for wheezing or shortness of breath.    beclomethasone (QVAR) 80 MCG/ACT inhaler Inhale 2 puffs into the lungs 2 (two) times daily.   BIOTIN 5000 PO Take by mouth. Every other day   BLACK ELDERBERRY PO Take by mouth.   calcium carbonate (OS-CAL - DOSED IN MG OF ELEMENTAL CALCIUM) 1250 (500 Ca) MG tablet Take 1 tablet by mouth daily.   cholecalciferol (VITAMIN D) 1000 UNITS tablet Take 1,000 Units by mouth daily.   clobetasol (OLUX) 0.05 % topical foam Apply 1 application topically every 3 (three) days.   cloNIDine (CATAPRES) 0.1 MG tablet Take 0.1 mg by mouth daily as needed (high BP > 170).    Coenzyme Q10 (CO Q-10) 100 MG CAPS Take 100 mg by mouth daily.    Multiple Vitamin (MULTIVITAMIN) capsule Take 1 capsule by mouth daily.   rosuvastatin (CRESTOR) 5 MG tablet Take 5 mg by mouth every other day. On Mondays, Wednesdays, and Saturdays   valsartan (DIOVAN) 160 MG tablet Take 1 tablet (160 mg total) by mouth 2 (two) times daily.     Allergies:   Other, Amlodipine, Ciprofloxacin, Dog epithelium allergy skin test, Latex, Statins, Sulfa antibiotics, Sulfamethoxazole, Zetia [ezetimibe], and Penicillins   Social History   Socioeconomic History   Marital status: Married    Spouse name: Not on file   Number of children: 1   Years of education: MAx2   Highest education level: Not on file    Occupational History   Occupation: Retired   Occupation: Education officer, museum  Tobacco Use   Smoking status: Former Smoker    Quit date: 06/08/1966    Years since quitting: 53.8   Smokeless tobacco: Never Used  Vaping Use   Vaping Use: Never used  Substance and Sexual Activity   Alcohol use: Yes    Alcohol/week: 1.0 standard drink    Types: 1 drink(s) per week    Comment: wine   Drug use: No   Sexual activity: Not on file  Other Topics Concern   Not on file  Social History Narrative   Not on file   Social Determinants of Health   Financial Resource Strain:    Difficulty of Paying Living Expenses: Not on file  Food Insecurity:    Worried About Running Out of Food  in the Last Year: Not on file   Ran Out of Food in the Last Year: Not on file  Transportation Needs:    Lack of Transportation (Medical): Not on file   Lack of Transportation (Non-Medical): Not on file  Physical Activity:    Days of Exercise per Week: Not on file   Minutes of Exercise per Session: Not on file  Stress:    Feeling of Stress : Not on file  Social Connections:    Frequency of Communication with Friends and Family: Not on file   Frequency of Social Gatherings with Friends and Family: Not on file   Attends Religious Services: Not on file   Active Member of Clubs or Organizations: Not on file   Attends Archivist Meetings: Not on file   Marital Status: Not on file     Family History: The patient's family history includes Breast cancer in her maternal grandmother; Cancer in her maternal grandmother and mother; Diabetes in her father; Heart attack in her father, mother, and paternal grandfather; Hyperlipidemia in her sister.  ROS:   Please see the history of present illness.     EKGs/Labs/Other Studies Reviewed:    The following studies were reviewed today:  Carotid Ultrasound 06/19/2019: Summary:  - Right Carotid: Velocities in the right ICA are consistent with a  1-39%  stenosis.  - Left Carotid: Velocities in the left ICA are consistent with a 40-59%  stenosis.  - Vertebrals: Bilateral vertebral arteries demonstrate antegrade flow.  - Subclavians: Normal flow hemodynamics were seen in bilateral subclavian arteries. _______________  Elwyn Reach Monitor 10/06/2019 to 10/20/2019: 1. SR/SB (rates as low as the 30s)/ST 2. Occasional PACS/PVCs 3. Pauses up to 3 seconds 4. PSVT and short runs of NSVT 5. Needs ROV to discuss   EKG:  EKG ordered today. EKG personally reviewed and demonstrates sinus bradycardia, rate 50 bpm, with no acute ischemic changes. Normal axis. QTc 399 ms.  Recent Labs: No results found for requested labs within last 8760 hours.  Recent Lipid Panel No results found for: CHOL, TRIG, HDL, CHOLHDL, VLDL, LDLCALC, LDLDIRECT  Physical Exam:    Vital Signs: BP 130/68    Pulse (!) 50    Ht 5\' 5"  (1.651 m)    Wt 122 lb (55.3 kg)    SpO2 97%    BMI 20.30 kg/m     Wt Readings from Last 3 Encounters:  03/13/20 122 lb (55.3 kg)  02/29/20 125 lb (56.7 kg)  11/15/19 122 lb 3.2 oz (55.4 kg)     General: 84 y.o. female in no acute distress. HEENT: Normocephalic and atraumatic. Sclera clear.  Neck: Supple.  Heart: Bradycardic with regular rhythm. Distinct S1 and S2. No murmurs, gallops, or rubs.  Lungs: No increased work of breathing. Clear to ausculation bilaterally. No wheezes, rhonchi, or rales.  Abdomen: Soft, non-distended, and non-tender to palpation.  MSK: Normal strength and tone for age.  Extremities: No lower extremity edema.    Skin: Warm and dry. Neuro: Alert and oriented x3. No focal deficits. Psych: Normal affect. Responds appropriately.   Assessment:    1. Primary hypertension   2. Coronary artery disease involving native coronary artery of native heart without angina pectoris   3. Palpitations   4. Bilateral carotid artery stenosis   5. Hyperlipidemia, unspecified hyperlipidemia type     Plan:     Hypertension Patient continues to have episodes of BP spiking (usually in the middle of the night) but it seems to  be slightly better controlled. Systolic BP seems to mostly be in the 120's to 150's but can be in the 170's at times and as high as the low 200's. BP well controlled in our office. At first check, BP was 130/68. She then used her home wrist Omron machine while I was in the room and systolic BP was 222. I manually rechecked her BP right after this ans systolic BP was 979. I am really wondering if her home machine is accurate. I encouraged her to purchase a home BP machine with an arm cuff which tend to be more accurate. I also advised patient limit checking her BP to one times daily unless she is symptomatic (she is currently checking her BP about 4 times per day). Given it has been less than 1 week since last BP medications were adjusted, will continue current regimen per now. Continue Valsartan 160mg  twice daily and PRN Clonidine for systolic BP >892. Will have her follow back up with PharmD in a couple of weeks.   CAD S/p 2 overlapping stents to LAD in 2008. EKG today shows sinus bradycardia, rate 50 bpm, with no acute ST/T changes. No angina. Continue aspirin and statin. Patient was asking about repeat a stress test given history of CAD. Given no anginal symptoms, I do not think this is necessary at this time. Will update Echo though given persistent palpitations with associated dizziness.  Palpitations with Associated Dizziness Near Syncope Patient continue to have occasional palpitations with associated dizziness, often in the middle of the night during BP spikes. She also had one episode of near syncope in August. Monitor in 10/2019 showed underlying sinus rhythm with bradycardia (rates as low as the 30's) and tachycardia as well as occasional PACs/PVCs, paroxysmal SVT, short runs of non-sustained VT, and pauses up to 3 seconds. No evidence of atrial fibrillation. With baseline  bradycardia, cannot add AV nodal agent. Will update Echo given persistent palpitations.  Bilateral Carotid Stenosis Carotid dopplers in 06/2019 showed 39% stenosis of right ICA and 40-59% stenosis of left ICA. Continue aspirin and statin. Repeat dopplers planned for 06/2020.  Hyperlipidemia Lipid panel from 12/2019 (per [facility]): Total Cholesterol 161, Triglycerides 66, HDL 66, LDL 82.  Continue Crestor 5mg  on Monday, Wednesdays, Saturdays.   Disposition: Follow up in PharmD Hypertension Clinic in 2-3 weeks and with Dr. Gwenlyn Found in 3 months.   Medication Adjustments/Labs and Tests Ordered: Current medicines are reviewed at length with the patient today.  Concerns regarding medicines are outlined above.  Orders Placed This Encounter  Procedures   EKG 12-Lead   ECHOCARDIOGRAM COMPLETE   No orders of the defined types were placed in this encounter.   Patient Instructions  Medication Instructions:  No changes  *If you need a refill on your cardiac medications before your next appointment, please call your pharmacy*   Lab Work: None ordered If you have labs (blood work) drawn today and your tests are completely normal, you will receive your results only by:  [place] (if you have MyChart) OR  A paper copy in the mail If you have any lab test that is abnormal or we need to change your treatment, we will call you to review the results.   Testing/Procedures: Your physician has requested that you have an echocardiogram. Echocardiography is a painless test that uses sound waves to create images of your heart. It provides your doctor with information about the size and shape of your heart and how well your hearts chambers and valves  are working. This procedure takes approximately one hour. There are no restrictions for this procedure. This test is performed at our [facility] office located at [place]. [employer].     Follow-Up: At [facility], you and your health needs are our  priority.  As part of our continuing mission to provide you with exceptional heart care, we have created designated Provider Care Teams.  These Care Teams include your primary Cardiologist (physician) and Advanced Practice Providers (APPs -  Physician Assistants and Nurse Practitioners) who all work together to provide you with the care you need, when you need it.  We recommend signing up for the patient portal called "MyChart".  Sign up information is provided on this After Visit Summary.  MyChart is used to connect with patients for Virtual Visits (Telemedicine).  Patients are able to view lab/test results, encounter notes, upcoming appointments, etc.  Non-urgent messages can be sent to your provider as well.   To learn more about what you can do with MyChart, go to [email or web address].    Your next appointment:   You will return in 2-3 weeks to see our pharmacist for hypertension medication management for your blood pressure.  After that, your next follow up appointment with your provider is in: 3 month(s)  The format for your next appointment:   In Person  Provider:   Quay Burow, MD   Other Instructions None     Signed, Darreld Mclean, PA-C  03/13/2020 9:27 PM    [place]

## 2020-03-07 ENCOUNTER — Telehealth: Payer: Self-pay | Admitting: Pharmacist

## 2020-03-07 DIAGNOSIS — R1012 Left upper quadrant pain: Secondary | ICD-10-CM | POA: Diagnosis not present

## 2020-03-07 DIAGNOSIS — H25093 Other age-related incipient cataract, bilateral: Secondary | ICD-10-CM | POA: Diagnosis not present

## 2020-03-07 DIAGNOSIS — E782 Mixed hyperlipidemia: Secondary | ICD-10-CM | POA: Diagnosis not present

## 2020-03-07 DIAGNOSIS — G459 Transient cerebral ischemic attack, unspecified: Secondary | ICD-10-CM | POA: Diagnosis not present

## 2020-03-07 DIAGNOSIS — I2510 Atherosclerotic heart disease of native coronary artery without angina pectoris: Secondary | ICD-10-CM | POA: Diagnosis not present

## 2020-03-07 DIAGNOSIS — M1990 Unspecified osteoarthritis, unspecified site: Secondary | ICD-10-CM | POA: Diagnosis not present

## 2020-03-07 DIAGNOSIS — J4541 Moderate persistent asthma with (acute) exacerbation: Secondary | ICD-10-CM | POA: Diagnosis not present

## 2020-03-07 DIAGNOSIS — M1710 Unilateral primary osteoarthritis, unspecified knee: Secondary | ICD-10-CM | POA: Diagnosis not present

## 2020-03-07 DIAGNOSIS — I10 Essential (primary) hypertension: Secondary | ICD-10-CM | POA: Diagnosis not present

## 2020-03-07 MED ORDER — VALSARTAN 160 MG PO TABS: 160.0000 mg | ORAL_TABLET | Freq: Two times a day (BID) | ORAL | 5 refills | Status: DC

## 2020-03-07 NOTE — Telephone Encounter (Signed)
Patient call to discuss potential medication dose adjustment.  She is tolerating valsartan therapy as prescribed on Sept/23 by PharmD, but noticed BP control not lasting the whole day.   Patient still taking hydralazine as well.  Recommendation: 1. Okay to increase valsartan to 160mg  twice daily (10-12 hours between doses)  2. Take hydralazine 25mg  (1 tablet) every day between 5pm or 6pm for the next 3 days, then okay to stop and continue Valsartan therapy only.  3. If tolerating, will avoid additional changes for at least 2 weeks.

## 2020-03-11 DIAGNOSIS — H26492 Other secondary cataract, left eye: Secondary | ICD-10-CM | POA: Diagnosis not present

## 2020-03-13 ENCOUNTER — Encounter: Payer: Self-pay | Admitting: Student

## 2020-03-13 ENCOUNTER — Ambulatory Visit (INDEPENDENT_AMBULATORY_CARE_PROVIDER_SITE_OTHER): Payer: Medicare Other | Admitting: Student

## 2020-03-13 ENCOUNTER — Other Ambulatory Visit: Payer: Self-pay

## 2020-03-13 VITALS — BP 130/68 | HR 50 | Ht 65.0 in | Wt 122.0 lb

## 2020-03-13 DIAGNOSIS — I10 Essential (primary) hypertension: Secondary | ICD-10-CM | POA: Diagnosis not present

## 2020-03-13 DIAGNOSIS — I2510 Atherosclerotic heart disease of native coronary artery without angina pectoris: Secondary | ICD-10-CM | POA: Diagnosis not present

## 2020-03-13 DIAGNOSIS — E785 Hyperlipidemia, unspecified: Secondary | ICD-10-CM

## 2020-03-13 DIAGNOSIS — R002 Palpitations: Secondary | ICD-10-CM

## 2020-03-13 DIAGNOSIS — I6523 Occlusion and stenosis of bilateral carotid arteries: Secondary | ICD-10-CM | POA: Diagnosis not present

## 2020-03-13 NOTE — Patient Instructions (Signed)
Medication Instructions:  No changes  *If you need a refill on your cardiac medications before your next appointment, please call your pharmacy*   Lab Work: None ordered If you have labs (blood work) drawn today and your tests are completely normal, you will receive your results only by: [name] MyChart Message (if you have MyChart) OR . A paper copy in the mail If you have any lab test that is abnormal or we need to change your treatment, we will call you to review the results.   Testing/Procedures: Your physician has requested that you have an echocardiogram. Echocardiography is a painless test that uses sound waves to create images of your heart. It provides your doctor with information about the size and shape of your heart and how well your heart's chambers and valves are working. This procedure takes approximately one hour. There are no restrictions for this procedure. This test is performed at our [facility] office located at [place]. [employer].     Follow-Up: At [facility], you and your health needs are our priority.  As part of our continuing mission to provide you with exceptional heart care, we have created designated Provider Care Teams.  These Care Teams include your primary Cardiologist (physician) and Advanced Practice Providers (APPs -  Physician Assistants and Nurse Practitioners) who all work together to provide you with the care you need, when you need it.  We recommend signing up for the patient portal called "MyChart".  Sign up information is provided on this After Visit Summary.  MyChart is used to connect with patients for Virtual Visits (Telemedicine).  Patients are able to view lab/test results, encounter notes, upcoming appointments, etc.  Non-urgent messages can be sent to your provider as well.   To learn more about what you can do with MyChart, go to [email or web address].    Your next appointment:   You will return in 2-3 weeks to see our pharmacist for  hypertension medication management for your blood pressure.  After that, your next follow up appointment with your provider is in: 3 month(s)  The format for your next appointment:   In Person  Provider:   Quay Burow, MD   Other Instructions None

## 2020-03-15 DIAGNOSIS — K5900 Constipation, unspecified: Secondary | ICD-10-CM | POA: Diagnosis not present

## 2020-03-26 DIAGNOSIS — M1990 Unspecified osteoarthritis, unspecified site: Secondary | ICD-10-CM | POA: Diagnosis not present

## 2020-03-26 DIAGNOSIS — E782 Mixed hyperlipidemia: Secondary | ICD-10-CM | POA: Diagnosis not present

## 2020-03-26 DIAGNOSIS — I10 Essential (primary) hypertension: Secondary | ICD-10-CM | POA: Diagnosis not present

## 2020-03-26 DIAGNOSIS — M1710 Unilateral primary osteoarthritis, unspecified knee: Secondary | ICD-10-CM | POA: Diagnosis not present

## 2020-03-26 DIAGNOSIS — J4541 Moderate persistent asthma with (acute) exacerbation: Secondary | ICD-10-CM | POA: Diagnosis not present

## 2020-03-26 DIAGNOSIS — G459 Transient cerebral ischemic attack, unspecified: Secondary | ICD-10-CM | POA: Diagnosis not present

## 2020-03-26 DIAGNOSIS — I2510 Atherosclerotic heart disease of native coronary artery without angina pectoris: Secondary | ICD-10-CM | POA: Diagnosis not present

## 2020-03-26 DIAGNOSIS — H25093 Other age-related incipient cataract, bilateral: Secondary | ICD-10-CM | POA: Diagnosis not present

## 2020-03-26 DIAGNOSIS — Z23 Encounter for immunization: Secondary | ICD-10-CM | POA: Diagnosis not present

## 2020-04-04 ENCOUNTER — Other Ambulatory Visit: Payer: Self-pay

## 2020-04-04 ENCOUNTER — Ambulatory Visit (INDEPENDENT_AMBULATORY_CARE_PROVIDER_SITE_OTHER): Payer: Medicare Other | Admitting: Pharmacist

## 2020-04-04 VITALS — BP 168/82 | HR 99 | Resp 15 | Ht 65.5 in | Wt 123.0 lb

## 2020-04-04 DIAGNOSIS — I10 Essential (primary) hypertension: Secondary | ICD-10-CM

## 2020-04-04 MED ORDER — NIFEDIPINE ER OSMOTIC RELEASE 30 MG PO TB24: 30.0000 mg | ORAL_TABLET | Freq: Every day | ORAL | 2 refills | Status: DC

## 2020-04-04 NOTE — Patient Instructions (Addendum)
It was nice meeting you today!  We would like your blood pressure to be less than 130/80  Please continue your valsartan 160mg  twice a day and your clonidine 0.1mg  as needed  We are going to start a new medication called extended release Nifedipine 30mg  which you will take once a day  Please call us with any questions!  Karren Cobble, PharmD, BCACP, [place] [address] [place], [place] [address] Phone: [phone]; Fax: [phone] 04/04/2020 12:15 PM

## 2020-04-04 NOTE — Progress Notes (Signed)
Patient ID: Kendra [name]                 DOB: [birth year]                      MRN: [id number]     HPI: Kendra [name] is a 84 y.o. female referred by Dr. Gwenlyn Found to HTN clinic. PMH is significant for CAD, HTN, and HLD.  Has been seen by pharmacy clinic and over the phone and was switched from losartan to valsartan which was then increased to 160mg  BID due to medication not lasting the entire day.  Patient saw Sande Rives on October 6 and has remained hypertensive with spikes overnight.  No medications were changed due to last change only being 1 week earlier.    Patient presents today in good spirits but remains hypertensive.  Checks her BP frequently at home using a wrist cuff.  Had been advised to purchase an upper arm cuff but patient feels her wrist cuff is accurate.  Is managed on valsartan 160mg  BID and clonidine 0.1mg  prn if systolic BP is <956.  She has had to frequently take clonidine.    Blood pressure is very variable throughout the day, as low as 122/63 and as high as 214/97.  Does report stress at home over concern over her husbands health since he is 15 years older than him.  Also concerned that if she becomes seriously ill then no one will be able to care for him.    Previously had tried amlodipine and reports it made her feel "shaky."  Reports being on HCTZ in the past and does not want a diuretic because she feels she already urinates too often.    Current HTN meds: clonidine 0.1mg  prn, valsartan 160mg  BID  Previously tried: losartan 50mg  daily, amlodipine 5mg , hydralazine 12.5mg  TID, olmesartan 40mg  daily BP goal: <130/80   Diet: Eats a vegetarian diet with plant based proteins    Wt Readings from Last 3 Encounters:  03/13/20 122 lb (55.3 kg)  02/29/20 125 lb (56.7 kg)  11/15/19 122 lb 3.2 oz (55.4 kg)   BP Readings from Last 3 Encounters:  03/13/20 130/68  02/29/20 (!) 164/78  11/15/19 (!) 154/84   Pulse Readings from Last 3 Encounters:  03/13/20 (!) 50   02/29/20 (!) 52  11/15/19 60    Renal function: CrCl cannot be calculated (Patient's most recent lab result is older than the maximum 21 days allowed.).  Past Medical History:  Diagnosis Date  . Asthma   . CAD (coronary artery disease)    last cath 08/2006  . Carotid artery disease (HCC)    moderate left ICA stenosis by 2.6 ultrasound  . DJD (degenerative joint disease), lumbar   . Hearing loss   . HTN (hypertension)   . Hyperlipidemia   . Vision loss of right eye     Current Outpatient Medications on File Prior to Visit  Medication Sig Dispense Refill  . albuterol (PROVENTIL HFA;VENTOLIN HFA) 108 (90 BASE) MCG/ACT inhaler Inhale 1-2 puffs into the lungs every 12 (twelve) hours as needed for wheezing or shortness of breath.     [name] aspirin EC 81 MG tablet Take 81 mg by mouth daily. Swallow whole.    . beclomethasone (QVAR) 80 MCG/ACT inhaler Inhale 2 puffs into the lungs 2 (two) times daily.    [name] BIOTIN 5000 PO Take by mouth. Every other day    . BLACK ELDERBERRY PO Take by mouth.    [name]  calcium carbonate (OS-CAL - DOSED IN MG OF ELEMENTAL CALCIUM) 1250 (500 Ca) MG tablet Take 1 tablet by mouth daily.    . cholecalciferol (VITAMIN D) 1000 UNITS tablet Take 1,000 Units by mouth daily.    . clobetasol (OLUX) 0.05 % topical foam Apply 1 application topically every 3 (three) days.    . cloNIDine (CATAPRES) 0.1 MG tablet Take 0.1 mg by mouth daily as needed (high BP > 170).     . Coenzyme Q10 (CO Q-10) 100 MG CAPS Take 100 mg by mouth daily.     . Multiple Vitamin (MULTIVITAMIN) capsule Take 1 capsule by mouth daily.    . rosuvastatin (CRESTOR) 5 MG tablet Take 5 mg by mouth every other day. On Mondays, Wednesdays, and Saturdays    . valsartan (DIOVAN) 160 MG tablet Take 1 tablet (160 mg total) by mouth 2 (two) times daily. 60 tablet 5   No current facility-administered medications on file prior to visit.    Allergies  Allergen Reactions  . Other     Other reaction(s): Respiratory  Distress  . Amlodipine     "shivers"   . Ciprofloxacin   . Dog Epithelium Allergy Skin Test   . Latex Other (See Comments)    Irritates her skin  . Statins     Unable to tolerate statins w the exception of crestor.  . Sulfa Antibiotics Other (See Comments)    "didnt feel good"  . Sulfamethoxazole Other (See Comments)    Makes her feel bad  . Zetia [Ezetimibe] Other (See Comments)    "makes me constipated"  . Penicillins Rash     Assessment/Plan:  1. Hypertension - Patient BP today 168/82 which is above goal of <130/80.  Patient already maxed out on valsartan and is frequently needing to administer clonidine, especially at night.  Since patient does not want a diuretic, will try Nifedipine XL in hopes that extended release formulation does not cause her to have any side effects.  Instructed patient to call us with any intolerance to new medication and to continue to check BP.  Patient voiced understanding.  Karren Cobble, PharmD, BCACP, [place] [address] [place], [place] [address] Phone: [phone]; Fax: [phone] 04/05/2020 10:11 AM

## 2020-04-05 DIAGNOSIS — K5900 Constipation, unspecified: Secondary | ICD-10-CM | POA: Diagnosis not present

## 2020-04-10 ENCOUNTER — Ambulatory Visit (HOSPITAL_COMMUNITY): Payer: Medicare Other | Attending: Cardiovascular Disease

## 2020-04-10 ENCOUNTER — Other Ambulatory Visit: Payer: Self-pay

## 2020-04-10 DIAGNOSIS — I2510 Atherosclerotic heart disease of native coronary artery without angina pectoris: Secondary | ICD-10-CM

## 2020-04-10 DIAGNOSIS — R002 Palpitations: Secondary | ICD-10-CM

## 2020-04-10 LAB — ECHOCARDIOGRAM COMPLETE
Area-P 1/2: 2.29 cm2
S' Lateral: 2.3 cm

## 2020-04-19 DIAGNOSIS — G459 Transient cerebral ischemic attack, unspecified: Secondary | ICD-10-CM | POA: Diagnosis not present

## 2020-04-19 DIAGNOSIS — H25093 Other age-related incipient cataract, bilateral: Secondary | ICD-10-CM | POA: Diagnosis not present

## 2020-04-19 DIAGNOSIS — I10 Essential (primary) hypertension: Secondary | ICD-10-CM | POA: Diagnosis not present

## 2020-04-19 DIAGNOSIS — J4541 Moderate persistent asthma with (acute) exacerbation: Secondary | ICD-10-CM | POA: Diagnosis not present

## 2020-04-19 DIAGNOSIS — E782 Mixed hyperlipidemia: Secondary | ICD-10-CM | POA: Diagnosis not present

## 2020-04-19 DIAGNOSIS — M1990 Unspecified osteoarthritis, unspecified site: Secondary | ICD-10-CM | POA: Diagnosis not present

## 2020-04-19 DIAGNOSIS — M1710 Unilateral primary osteoarthritis, unspecified knee: Secondary | ICD-10-CM | POA: Diagnosis not present

## 2020-04-19 DIAGNOSIS — I2510 Atherosclerotic heart disease of native coronary artery without angina pectoris: Secondary | ICD-10-CM | POA: Diagnosis not present

## 2020-04-29 ENCOUNTER — Ambulatory Visit: Payer: Medicare Other | Admitting: Podiatry

## 2020-05-06 DIAGNOSIS — R194 Change in bowel habit: Secondary | ICD-10-CM | POA: Diagnosis not present

## 2020-05-06 DIAGNOSIS — K5900 Constipation, unspecified: Secondary | ICD-10-CM | POA: Diagnosis not present

## 2020-05-07 DIAGNOSIS — H25093 Other age-related incipient cataract, bilateral: Secondary | ICD-10-CM | POA: Diagnosis not present

## 2020-05-07 DIAGNOSIS — J4541 Moderate persistent asthma with (acute) exacerbation: Secondary | ICD-10-CM | POA: Diagnosis not present

## 2020-05-07 DIAGNOSIS — I2510 Atherosclerotic heart disease of native coronary artery without angina pectoris: Secondary | ICD-10-CM | POA: Diagnosis not present

## 2020-05-07 DIAGNOSIS — M1990 Unspecified osteoarthritis, unspecified site: Secondary | ICD-10-CM | POA: Diagnosis not present

## 2020-05-07 DIAGNOSIS — R7303 Prediabetes: Secondary | ICD-10-CM | POA: Diagnosis not present

## 2020-05-07 DIAGNOSIS — E782 Mixed hyperlipidemia: Secondary | ICD-10-CM | POA: Diagnosis not present

## 2020-05-07 DIAGNOSIS — K5901 Slow transit constipation: Secondary | ICD-10-CM | POA: Diagnosis not present

## 2020-05-07 DIAGNOSIS — M1710 Unilateral primary osteoarthritis, unspecified knee: Secondary | ICD-10-CM | POA: Diagnosis not present

## 2020-05-07 DIAGNOSIS — I10 Essential (primary) hypertension: Secondary | ICD-10-CM | POA: Diagnosis not present

## 2020-05-10 DIAGNOSIS — M1990 Unspecified osteoarthritis, unspecified site: Secondary | ICD-10-CM | POA: Diagnosis not present

## 2020-05-10 DIAGNOSIS — M1710 Unilateral primary osteoarthritis, unspecified knee: Secondary | ICD-10-CM | POA: Diagnosis not present

## 2020-05-10 DIAGNOSIS — I2510 Atherosclerotic heart disease of native coronary artery without angina pectoris: Secondary | ICD-10-CM | POA: Diagnosis not present

## 2020-05-10 DIAGNOSIS — K5901 Slow transit constipation: Secondary | ICD-10-CM | POA: Diagnosis not present

## 2020-05-10 DIAGNOSIS — H25093 Other age-related incipient cataract, bilateral: Secondary | ICD-10-CM | POA: Diagnosis not present

## 2020-05-10 DIAGNOSIS — Z23 Encounter for immunization: Secondary | ICD-10-CM | POA: Diagnosis not present

## 2020-05-10 DIAGNOSIS — I10 Essential (primary) hypertension: Secondary | ICD-10-CM | POA: Diagnosis not present

## 2020-05-10 DIAGNOSIS — E782 Mixed hyperlipidemia: Secondary | ICD-10-CM | POA: Diagnosis not present

## 2020-05-10 DIAGNOSIS — J4541 Moderate persistent asthma with (acute) exacerbation: Secondary | ICD-10-CM | POA: Diagnosis not present

## 2020-05-10 DIAGNOSIS — R7303 Prediabetes: Secondary | ICD-10-CM | POA: Diagnosis not present

## 2020-05-14 DIAGNOSIS — H25093 Other age-related incipient cataract, bilateral: Secondary | ICD-10-CM | POA: Diagnosis not present

## 2020-05-14 DIAGNOSIS — M1990 Unspecified osteoarthritis, unspecified site: Secondary | ICD-10-CM | POA: Diagnosis not present

## 2020-05-14 DIAGNOSIS — E782 Mixed hyperlipidemia: Secondary | ICD-10-CM | POA: Diagnosis not present

## 2020-05-14 DIAGNOSIS — J4541 Moderate persistent asthma with (acute) exacerbation: Secondary | ICD-10-CM | POA: Diagnosis not present

## 2020-05-14 DIAGNOSIS — M1710 Unilateral primary osteoarthritis, unspecified knee: Secondary | ICD-10-CM | POA: Diagnosis not present

## 2020-05-14 DIAGNOSIS — I2510 Atherosclerotic heart disease of native coronary artery without angina pectoris: Secondary | ICD-10-CM | POA: Diagnosis not present

## 2020-05-14 DIAGNOSIS — G459 Transient cerebral ischemic attack, unspecified: Secondary | ICD-10-CM | POA: Diagnosis not present

## 2020-05-14 DIAGNOSIS — I10 Essential (primary) hypertension: Secondary | ICD-10-CM | POA: Diagnosis not present

## 2020-05-21 ENCOUNTER — Ambulatory Visit (INDEPENDENT_AMBULATORY_CARE_PROVIDER_SITE_OTHER): Payer: Medicare Other | Admitting: Podiatry

## 2020-05-21 ENCOUNTER — Other Ambulatory Visit: Payer: Self-pay

## 2020-05-21 DIAGNOSIS — I6523 Occlusion and stenosis of bilateral carotid arteries: Secondary | ICD-10-CM

## 2020-05-21 DIAGNOSIS — B351 Tinea unguium: Secondary | ICD-10-CM

## 2020-05-21 DIAGNOSIS — M79674 Pain in right toe(s): Secondary | ICD-10-CM

## 2020-05-21 DIAGNOSIS — M79675 Pain in left toe(s): Secondary | ICD-10-CM | POA: Diagnosis not present

## 2020-05-21 DIAGNOSIS — M7989 Other specified soft tissue disorders: Secondary | ICD-10-CM | POA: Diagnosis not present

## 2020-05-21 DIAGNOSIS — M19079 Primary osteoarthritis, unspecified ankle and foot: Secondary | ICD-10-CM

## 2020-05-26 NOTE — Progress Notes (Signed)
Subjective: 84 year old female presents the office today for concerns of thick, discolored toenails she cannot trim her self and cause discomfort.  She is also concerned about swelling in her left foot.  Patient is concerned about possibly circulation issue causing swelling.  She denies any claudication symptoms but due to the swelling in her left foot and leg she would have to have her circulation checked as well. Denies any systemic complaints such as fevers, chills, nausea, vomiting. No acute changes since last appointment, and no other complaints at this time.   Objective: AAO x3, NAD DP/PT pulses palpable bilaterally, CRT less than 3 seconds.  The skin is tanned to bilateral legs and veins are easily present.   There is chronic edema present mostly to the left foot but also to the left lower extremity.  I do think the majority of swelling to the dorsal foot is related to the arthritis is this is along the midfoot area.  There is no pain associated swelling today.  There is no pain with calf compression, erythema or warmth.  The nails appear to be hypertrophic, dystrophic with yellow-brown discoloration.  She has good discomfort in the nails.  No edema, erythema and signs of infection. No open lesions.  Assessment: Symptomatic onychomycosis, swelling  Plan: -All treatment options discussed with the patient including all alternatives, risks, complications.  -Nails related x10 without any complications or bleeding -We will check a venous reflux study as well as arterial studies given the swelling to her left lower extremity however I do think the swelling of her foot is mostly result of the arthritis. -Patient encouraged to call the office with any questions, concerns, change in symptoms.   Trula Slade DPM

## 2020-05-30 ENCOUNTER — Other Ambulatory Visit: Payer: Self-pay

## 2020-05-30 ENCOUNTER — Ambulatory Visit (INDEPENDENT_AMBULATORY_CARE_PROVIDER_SITE_OTHER)
Admission: RE | Admit: 2020-05-30 | Discharge: 2020-05-30 | Disposition: A | Discharge status: Home / Self Care | Payer: Medicare Other | Source: Ambulatory Visit | Attending: Podiatry | Admitting: Podiatry

## 2020-05-30 ENCOUNTER — Ambulatory Visit (HOSPITAL_COMMUNITY)
Admission: RE | Admit: 2020-05-30 | Discharge: 2020-05-30 | Disposition: A | Discharge status: Home / Self Care | Payer: Medicare Other | Source: Ambulatory Visit | Attending: Podiatry | Admitting: Podiatry

## 2020-05-30 DIAGNOSIS — M7989 Other specified soft tissue disorders: Secondary | ICD-10-CM | POA: Diagnosis not present

## 2020-06-21 ENCOUNTER — Encounter: Payer: Self-pay | Admitting: Cardiovascular Disease

## 2020-06-21 ENCOUNTER — Other Ambulatory Visit: Payer: Self-pay

## 2020-06-21 ENCOUNTER — Ambulatory Visit (INDEPENDENT_AMBULATORY_CARE_PROVIDER_SITE_OTHER): Payer: Medicare Other | Admitting: Cardiovascular Disease

## 2020-06-21 DIAGNOSIS — I10 Essential (primary) hypertension: Secondary | ICD-10-CM

## 2020-06-21 DIAGNOSIS — I6522 Occlusion and stenosis of left carotid artery: Secondary | ICD-10-CM

## 2020-06-21 DIAGNOSIS — E782 Mixed hyperlipidemia: Secondary | ICD-10-CM | POA: Diagnosis not present

## 2020-06-21 DIAGNOSIS — I2510 Atherosclerotic heart disease of native coronary artery without angina pectoris: Secondary | ICD-10-CM

## 2020-06-21 NOTE — Assessment & Plan Note (Signed)
History of hyperlipidemia recently started on rosuvastatin by her PCP who follows her lipid profile.  Her last lipid profile performed 05/10/2020 revealed total cholesterol 191, LDL of 98 and HDL of 80.

## 2020-06-21 NOTE — Patient Instructions (Signed)
Medication Instructions:  Your physician recommends that you continue on your current medications as directed. Please refer to the Current Medication list given to you today.  *If you need a refill on your cardiac medications before your next appointment, please call your pharmacy*  Testing/Procedures: Your physician has requested that you have a carotid duplex. This test is an ultrasound of the carotid arteries in your neck. It looks at blood flow through these arteries that supply the brain with blood. Allow one hour for this exam. There are no restrictions or special instructions. [place]. 2nd Floor   Follow-Up: At [facility], you and your health needs are our priority.  As part of our continuing mission to provide you with exceptional heart care, we have created designated Provider Care Teams.  These Care Teams include your primary Cardiologist (physician) and Advanced Practice Providers (APPs -  Physician Assistants and Nurse Practitioners) who all work together to provide you with the care you need, when you need it.  We recommend signing up for the patient portal called "MyChart".  Sign up information is provided on this After Visit Summary.  MyChart is used to connect with patients for Virtual Visits (Telemedicine).  Patients are able to view lab/test results, encounter notes, upcoming appointments, etc.  Non-urgent messages can be sent to your provider as well.   To learn more about what you can do with MyChart, go to [email or web address].    Your next appointment:   6 month(s)  The format for your next appointment:   In Person  Provider:   Sande Rives, PA-C  Then back to see Dr. Gwenlyn Found in 12 months.    Other Instructions Back to see a PharmD for hypertension management.

## 2020-06-21 NOTE — Progress Notes (Signed)
06/21/2020 Kendra [name]   02-03-1936  [id number]  Primary Physician Kendra Shanks, MD Primary Cardiologist: Kendra Harp MD Kendra [name], [place]  HPI:  Kendra [name] is a 85 y.o.  mildly overweight married Caucasian female mother of 1 daughter who moved from from [place] to be [place] to be closer to her daughter. I last saw her in the office 09/27/2019.Marland KitchenShe self-referred to be established in our practice for ongoing cardiac care. She had previously seen Dr. Candee [name]. I last saw her in the office 12/15/13. Her risk factors for heart disease are remarkable for treated hypertension, hyper-lipidemia and family history. Her mother did have 2 myocardial infarctions. She has never smoked. She had 2 overlapping stents placed in her LAD in [facility] clinic March 08 has been asymptomatic since. She had negative Myoview2 yearsago. She has had carotid Dopplers last year and again this year which show mild to moderate left internal carotid artery stenosis. Since I saw her last her only complaints are of nocturnal spikes of her systolic blood pressure up to the 190 range.I have reviewed her blood pressure log which reveals her blood pressure to be well-controlled on her current medications.   Since I saw her in the office 8 months ago she continues to do well.    She continues to have labile hypertension followed by our Pharm.D.'s.  She was placed on nifedipine which apparently she does not tolerate.  She takes as needed clonidine and losartan.  She keeps a blood pressure log at home.  She was also complaining of some palpitations and had an event monitor 10/06/2019 that showed occasional PACs and PVCs with short runs of PSVT and nonsustained ventricular tachycardia.  She does have moderate left ICA stenosis measured 06/19/2019 and normal ABIs.  She denies chest pain or shortness of breath.   Current Meds  Medication Sig  . albuterol (PROVENTIL HFA;VENTOLIN HFA) 108  (90 BASE) MCG/ACT inhaler Inhale 1-2 puffs into the lungs every 12 (twelve) hours as needed for wheezing or shortness of breath.   [name] aspirin EC 81 MG tablet Take 81 mg by mouth daily. Swallow whole.  . beclomethasone (QVAR) 80 MCG/ACT inhaler Inhale 2 puffs into the lungs 2 (two) times daily.  [name] BIOTIN 5000 PO Take by mouth. Every other day  . BLACK ELDERBERRY PO Take by mouth.  . calcium carbonate (OS-CAL - DOSED IN MG OF ELEMENTAL CALCIUM) 1250 (500 Ca) MG tablet Take 1 tablet by mouth daily.  . cholecalciferol (VITAMIN D) 1000 UNITS tablet Take 1,000 Units by mouth daily.  . clobetasol (OLUX) 0.05 % topical foam Apply 1 application topically every 3 (three) days.  . cloNIDine (CATAPRES) 0.1 MG tablet Take 0.1 mg by mouth daily as needed (high BP > 170).   . Coenzyme Q10 (CO Q-10) 100 MG CAPS Take 100 mg by mouth daily.   . hydrALAZINE (APRESOLINE) 25 MG tablet Take 12.5 mg by mouth in the morning and at bedtime.  [name] losartan (COZAAR) 50 MG tablet Take 50 mg by mouth in the morning and at bedtime.  . Multiple Vitamin (MULTIVITAMIN) capsule Take 1 capsule by mouth daily.  . rosuvastatin (CRESTOR) 10 MG tablet Take 10 mg by mouth every other day.  . [DISCONTINUED] rosuvastatin (CRESTOR) 5 MG tablet Take 5 mg by mouth every other day. On Mondays, Wednesdays, and Saturdays  . [DISCONTINUED] valsartan (DIOVAN) 160 MG tablet Take 1 tablet (160 mg total) by mouth 2 (two) times  daily.     Allergies  Allergen Reactions  . Other     Other reaction(s): Respiratory Distress  . Amlodipine     "shivers"   . Ciprofloxacin   . Dog Epithelium Allergy Skin Test   . Latex Other (See Comments)    Irritates her skin  . Statins     Unable to tolerate statins w the exception of crestor.  . Sulfa Antibiotics Other (See Comments)    "didnt feel good"  . Sulfamethoxazole Other (See Comments)    Makes her feel bad  . Zetia [Ezetimibe] Other (See Comments)    "makes me constipated"  . Penicillins Rash     Social History   Socioeconomic History  . Marital status: Married    Spouse name: Not on file  . Number of children: 1  . Years of education: MAx2  . Highest education level: Not on file  Occupational History  . Occupation: Retired  . Occupation: Education officer, museum  Tobacco Use  . Smoking status: Former Smoker    Quit date: 06/08/1966    Years since quitting: 54.0  . Smokeless tobacco: Never Used  Vaping Use  . Vaping Use: Never used  Substance and Sexual Activity  . Alcohol use: Yes    Alcohol/week: 1.0 standard drink    Types: 1 drink(s) per week    Comment: wine  . Drug use: No  . Sexual activity: Not on file  Other Topics Concern  . Not on file  Social History Narrative  . Not on file   Social Determinants of Health   Financial Resource Strain: Not on file  Food Insecurity: Not on file  Transportation Needs: Not on file  Physical Activity: Not on file  Stress: Not on file  Social Connections: Not on file  Intimate Partner Violence: Not on file     Review of Systems: General: negative for chills, fever, night sweats or weight changes.  Cardiovascular: negative for chest pain, dyspnea on exertion, edema, orthopnea, palpitations, paroxysmal nocturnal dyspnea or shortness of breath Dermatological: negative for rash Respiratory: negative for cough or wheezing Urologic: negative for hematuria Abdominal: negative for nausea, vomiting, diarrhea, bright red blood per rectum, melena, or hematemesis Neurologic: negative for visual changes, syncope, or dizziness All other systems reviewed and are otherwise negative except as noted above.    Blood pressure 138/70, pulse 68, height 5' 5.5" (1.664 m), weight 124 lb (56.2 kg).  General appearance: alert and no distress Neck: no adenopathy, no carotid bruit, no JVD, supple, symmetrical, trachea midline and thyroid not enlarged, symmetric, no tenderness/mass/nodules Lungs: clear to auscultation bilaterally Heart: regular  rate and rhythm, S1, S2 normal, no murmur, click, rub or gallop Extremities: extremities normal, atraumatic, no cyanosis or edema Pulses: 2+ and symmetric Skin: Skin color, texture, turgor normal. No rashes or lesions Neurologic: Alert and oriented X 3, normal strength and tone. Normal symmetric reflexes. Normal coordination and gait  EKG not performed today  ASSESSMENT AND PLAN:   Coronary artery disease History of CAD status post overlapping stents in her LAD performed at [facility] clinic March 2008, she has been asymptomatic since.  Essential hypertension History of essential hypertension are somewhat labile.  Her blood pressure today is 138/70.  She was placed on nifedipine by our Pharm.D. which apparently she does not tolerate.  She is on clonidine as needed and losartan.  I am referring her back to our Pharm.D. for further evaluation and medical treatment of her hypertension.  Hyperlipidemia History of hyperlipidemia  recently started on rosuvastatin by her PCP who follows her lipid profile.  Her last lipid profile performed 05/10/2020 revealed total cholesterol 191, LDL of 98 and HDL of 80.  Carotid artery disease History of moderate left ICA stenosis by duplex ultrasound 06/19/2019.  We will repeat carotid Doppler studies.      Kendra Harp MD FACP,FACC,FAHA, [facility] 06/21/2020 11:15 AM

## 2020-06-21 NOTE — Assessment & Plan Note (Signed)
History of moderate left ICA stenosis by duplex ultrasound 06/19/2019.  We will repeat carotid Doppler studies.

## 2020-06-21 NOTE — Assessment & Plan Note (Signed)
History of essential hypertension are somewhat labile.  Her blood pressure today is 138/70.  She was placed on nifedipine by our Pharm.D. which apparently she does not tolerate.  She is on clonidine as needed and losartan.  I am referring her back to our Pharm.D. for further evaluation and medical treatment of her hypertension.

## 2020-06-21 NOTE — Assessment & Plan Note (Signed)
History of CAD status post overlapping stents in her LAD performed at [facility] clinic March 2008, she has been asymptomatic since.

## 2020-06-23 ENCOUNTER — Other Ambulatory Visit: Payer: Self-pay | Admitting: Cardiovascular Disease

## 2020-06-23 DIAGNOSIS — I10 Essential (primary) hypertension: Secondary | ICD-10-CM

## 2020-06-27 DIAGNOSIS — J4541 Moderate persistent asthma with (acute) exacerbation: Secondary | ICD-10-CM | POA: Diagnosis not present

## 2020-06-27 DIAGNOSIS — I2510 Atherosclerotic heart disease of native coronary artery without angina pectoris: Secondary | ICD-10-CM | POA: Diagnosis not present

## 2020-06-27 DIAGNOSIS — I10 Essential (primary) hypertension: Secondary | ICD-10-CM | POA: Diagnosis not present

## 2020-06-27 DIAGNOSIS — G459 Transient cerebral ischemic attack, unspecified: Secondary | ICD-10-CM | POA: Diagnosis not present

## 2020-06-27 DIAGNOSIS — M1710 Unilateral primary osteoarthritis, unspecified knee: Secondary | ICD-10-CM | POA: Diagnosis not present

## 2020-06-27 DIAGNOSIS — M1990 Unspecified osteoarthritis, unspecified site: Secondary | ICD-10-CM | POA: Diagnosis not present

## 2020-06-27 DIAGNOSIS — H25093 Other age-related incipient cataract, bilateral: Secondary | ICD-10-CM | POA: Diagnosis not present

## 2020-06-27 DIAGNOSIS — E782 Mixed hyperlipidemia: Secondary | ICD-10-CM | POA: Diagnosis not present

## 2020-07-05 ENCOUNTER — Other Ambulatory Visit: Payer: Self-pay

## 2020-07-05 ENCOUNTER — Ambulatory Visit (HOSPITAL_COMMUNITY)
Admission: RE | Admit: 2020-07-05 | Discharge: 2020-07-05 | Disposition: A | Discharge status: Home / Self Care | Payer: Medicare Other | Source: Ambulatory Visit | Attending: Internal Medicine | Admitting: Internal Medicine

## 2020-07-05 DIAGNOSIS — I6522 Occlusion and stenosis of left carotid artery: Secondary | ICD-10-CM

## 2020-07-09 ENCOUNTER — Ambulatory Visit (INDEPENDENT_AMBULATORY_CARE_PROVIDER_SITE_OTHER): Payer: Medicare Other | Admitting: Pharmacist

## 2020-07-09 ENCOUNTER — Other Ambulatory Visit: Payer: Self-pay

## 2020-07-09 VITALS — BP 162/70 | HR 58 | Ht 65.5 in | Wt 122.6 lb

## 2020-07-09 DIAGNOSIS — I10 Essential (primary) hypertension: Secondary | ICD-10-CM | POA: Diagnosis not present

## 2020-07-09 MED ORDER — VALSARTAN 80 MG PO TABS: 80.0000 mg | ORAL_TABLET | Freq: Every day | ORAL | 1 refills | Status: DC

## 2020-07-09 MED ORDER — HYDRALAZINE HCL 10 MG PO TABS: 10.0000 mg | ORAL_TABLET | Freq: Three times a day (TID) | ORAL | 1 refills | Status: DC

## 2020-07-09 NOTE — Patient Instructions (Addendum)
Return for a  follow up appointment in 8 weeks  Check your blood pressure at home daily (if able) and keep record of the readings.  Take your BP meds as follows (start 2-3 days after colonoscopy): *STOP taking losartan* *START taking valsartan 80mg  twice daily*  *Call clinic or pharmacy once hydralazine 25mg  tablets supply low to refill hydralazine at 10mg *   *May try White flower Balm analgesic (Chinese) for joint pain*   Bring all of your meds, your BP cuff and your record of home blood pressures to your next appointment.  Exercise as you're able, try to walk approximately 30 minutes per day.  Keep salt intake to a minimum, especially watch canned and prepared boxed foods.  Eat more fresh fruits and vegetables and fewer canned items.  Avoid eating in fast food restaurants.    HOW TO TAKE YOUR BLOOD PRESSURE: . Rest 5 minutes before taking your blood pressure. .  Don't smoke or drink caffeinated beverages for at least 30 minutes before. . Take your blood pressure before (not after) you eat. . Sit comfortably with your back supported and both feet on the floor (don't cross your legs). . Elevate your arm to heart level on a table or a desk. . Use the proper sized cuff. It should fit smoothly and snugly around your bare upper arm. There should be enough room to slip a fingertip under the cuff. The bottom edge of the cuff should be 1 inch above the crease of the elbow. . Ideally, take 3 measurements at one sitting and record the average.

## 2020-07-09 NOTE — Progress Notes (Signed)
Patient ID: Kendra [name]                 DOB: 22-Jan-1936                      MRN: [id number]     HPI: Kendra [name] is a 85 y.o. female referred by Dr. Gwenlyn Found to HTN clinic.PMH is significant for CAD, HTN, and HLD.  Seen multiple times at the clinic for HTN management by different pharmacists. Losartan was changed to valsartan 160mg , then increased to valsartan 160mg  BID and nifedipine was added to therapy as well. Patient seems unable to tolerate all previous changes after few doses and is currently back on losartan 50mg  twice daily, hydralazine and clonidine therapy. She will like to avoid diuretic because , she feels she" already urinates too often".   Denies dizziness, swelling, increased head aches, blurry vision or chest pain. Reports her BP continues to spike during night and still using clonidine as needed.   After lengthy conversation with patient, she felt that changing losartan to valsartan was ineffective, but looking at her BP log for the past 2 months, we were able to noticed a better long term control of the BP while on valsartan. She is willing to re-challenge with low dose valsartan twice daily. Patient also reports that NONE of her BP readings are done after resting for 3-5 minutes   Current HTN meds:  Clonidine 0.1mg  as needed for BP > 170 Hydralazine 12.5mg  TID (11am-4pm-11pm) losartan 50mg  twice daily  Previously tried:  Valsartan - tired and not helping Amlodipine - shivers, schaky Olmesartan - unknown reaction Nifedipine - headaches, stomach aches (swore, lightheadedness, urinating more, worsen constipation, low energy, and back pain  BP goal:  < 140/90  Family History:   Social History: denies alcohol, 1 cup of regular and 2 cups of decaf, occasional tea.  Diet: low sodium diet, eats vegetarian diet, plant based protein  Exercise: thi-chi at home 2 times per week, and walking when weather permits  Home BP readings:  21 readings, average 154/69  (range 124/53 to 187/92), HR range 45 to 72bpm  Wt Readings from Last 3 Encounters:  07/09/20 122 lb 9.6 oz (55.6 kg)  06/21/20 124 lb (56.2 kg)  04/04/20 123 lb (55.8 kg)   BP Readings from Last 3 Encounters:  07/09/20 (!) 162/70  06/21/20 138/70  04/04/20 (!) 168/82   Pulse Readings from Last 3 Encounters:  07/09/20 (!) 58  06/21/20 68  04/04/20 99    Past Medical History:  Diagnosis Date  . Asthma   . CAD (coronary artery disease)    last cath 08/2006  . Carotid artery disease (HCC)    moderate left ICA stenosis by 2.6 ultrasound  . DJD (degenerative joint disease), lumbar   . Hearing loss   . HTN (hypertension)   . Hyperlipidemia   . Vision loss of right eye     Current Outpatient Medications on File Prior to Visit  Medication Sig Dispense Refill  . albuterol (VENTOLIN HFA) 108 (90 Base) MCG/ACT inhaler Inhale 2 puffs into the lungs every 12 (twelve) hours.    . Ascorbic Acid (VITAMIN C) 1000 MG tablet Take 1,000 mg by mouth daily.    [name] aspirin EC 81 MG tablet Take 81 mg by mouth daily. Swallow whole.    . beclomethasone (QVAR) 80 MCG/ACT inhaler Inhale 1-2 puffs into the lungs 2 (two) times daily.    [name] BIOTIN 5000 PO Take  1 tablet by mouth daily.    [name] BLACK ELDERBERRY PO Take 575 mg by mouth daily.    . calcium carbonate (OS-CAL - DOSED IN MG OF ELEMENTAL CALCIUM) 1250 (500 Ca) MG tablet Take 1 tablet by mouth daily.    . cholecalciferol (VITAMIN D) 1000 UNITS tablet Take 1,000 Units by mouth daily.    . clobetasol (OLUX) 0.05 % topical foam Apply 1 application topically every 3 (three) days.    . cloNIDine (CATAPRES) 0.1 MG tablet Take 0.1 mg by mouth daily as needed (high BP > 170). Taking 1/2 to 1 tablet daily as needed for SBP > 170.     [name] Coenzyme Q10 (CO Q-10) 100 MG CAPS Take 100 mg by mouth daily.     [name] docusate sodium (COLACE) 100 MG capsule Take 100 mg by mouth daily as needed.    . fluticasone (FLONASE) 50 MCG/ACT nasal spray Place 1 spray into both  nostrils daily as needed.    . Homeopathic Products (ARNICA EX) Apply topically as needed. Sore areas    . ketoconazole (NIZORAL) 2 % cream Apply 1 application topically daily. Apply to toe    . Multiple Vitamin (MULTIVITAMIN) capsule Take 1 capsule by mouth daily.    . rosuvastatin (CRESTOR) 10 MG tablet Take 10 mg by mouth every other day.    . triamcinolone (KENALOG) 0.1 % Apply 1 application topically 2 (two) times daily as needed.    . Wheat Dextrin (BENEFIBER DRINK MIX PO) Take 1 Dose by mouth daily as needed.     No current facility-administered medications on file prior to visit.    Allergies  Allergen Reactions  . Other     Other reaction(s): Respiratory Distress  . Amlodipine     "shivers"   . Ciprofloxacin   . Dog Epithelium Allergy Skin Test   . Latex Other (See Comments)    Irritates her skin  . Statins     Unable to tolerate statins w the exception of crestor.  . Sulfa Antibiotics Other (See Comments)    "didnt feel good"  . Sulfamethoxazole Other (See Comments)    Makes her feel bad  . Zetia [Ezetimibe] Other (See Comments)    "makes me constipated"  . Penicillins Rash    Blood pressure (!) 162/70, pulse (!) 58, height 5' 5.5" (1.664 m), weight 122 lb 9.6 oz (55.6 kg), SpO2 96 %.  Essential hypertension Blood pressure remains above goal. Will STOP losartan, and resume valsartan at 80mg  twice daily.  She will rest 3-5 minutes before checking her blood pressure and will change hydralazine 12.5mg  (1/2 to 25mg  tablet) to hydralazine 10mg  TID.  Plan to follow up in 8 weeks and continue titration as needed.   Brentin Shin Rodriguez-Guzman PharmD, BCPS, [place] [id number] 07/17/2020 3:04 PM

## 2020-07-15 DIAGNOSIS — H02825 Cysts of left lower eyelid: Secondary | ICD-10-CM | POA: Diagnosis not present

## 2020-07-15 DIAGNOSIS — H40013 Open angle with borderline findings, low risk, bilateral: Secondary | ICD-10-CM | POA: Diagnosis not present

## 2020-07-15 DIAGNOSIS — Z961 Presence of intraocular lens: Secondary | ICD-10-CM | POA: Diagnosis not present

## 2020-07-15 DIAGNOSIS — H04123 Dry eye syndrome of bilateral lacrimal glands: Secondary | ICD-10-CM | POA: Diagnosis not present

## 2020-07-17 ENCOUNTER — Encounter: Payer: Self-pay | Admitting: Pharmacist

## 2020-07-17 NOTE — Assessment & Plan Note (Signed)
Blood pressure remains above goal. Will STOP losartan, and resume valsartan at 80mg  twice daily.  She will rest 3-5 minutes before checking her blood pressure and will change hydralazine 12.5mg  (1/2 to 25mg  tablet) to hydralazine 10mg  TID.  Plan to follow up in 8 weeks and continue titration as needed.

## 2020-07-19 DIAGNOSIS — Z01812 Encounter for preprocedural laboratory examination: Secondary | ICD-10-CM | POA: Diagnosis not present

## 2020-07-24 DIAGNOSIS — K640 First degree hemorrhoids: Secondary | ICD-10-CM | POA: Diagnosis not present

## 2020-07-24 DIAGNOSIS — Z800 Family history of malignant neoplasm of digestive organs: Secondary | ICD-10-CM | POA: Diagnosis not present

## 2020-07-24 DIAGNOSIS — K5900 Constipation, unspecified: Secondary | ICD-10-CM | POA: Diagnosis not present

## 2020-07-24 DIAGNOSIS — R194 Change in bowel habit: Secondary | ICD-10-CM | POA: Diagnosis not present

## 2020-07-24 DIAGNOSIS — D120 Benign neoplasm of cecum: Secondary | ICD-10-CM | POA: Diagnosis not present

## 2020-07-28 DIAGNOSIS — M1710 Unilateral primary osteoarthritis, unspecified knee: Secondary | ICD-10-CM | POA: Diagnosis not present

## 2020-07-28 DIAGNOSIS — E782 Mixed hyperlipidemia: Secondary | ICD-10-CM | POA: Diagnosis not present

## 2020-07-28 DIAGNOSIS — I2510 Atherosclerotic heart disease of native coronary artery without angina pectoris: Secondary | ICD-10-CM | POA: Diagnosis not present

## 2020-07-28 DIAGNOSIS — G459 Transient cerebral ischemic attack, unspecified: Secondary | ICD-10-CM | POA: Diagnosis not present

## 2020-07-28 DIAGNOSIS — I10 Essential (primary) hypertension: Secondary | ICD-10-CM | POA: Diagnosis not present

## 2020-07-28 DIAGNOSIS — M1990 Unspecified osteoarthritis, unspecified site: Secondary | ICD-10-CM | POA: Diagnosis not present

## 2020-07-28 DIAGNOSIS — J4541 Moderate persistent asthma with (acute) exacerbation: Secondary | ICD-10-CM | POA: Diagnosis not present

## 2020-07-28 DIAGNOSIS — H25093 Other age-related incipient cataract, bilateral: Secondary | ICD-10-CM | POA: Diagnosis not present

## 2020-07-29 DIAGNOSIS — D120 Benign neoplasm of cecum: Secondary | ICD-10-CM | POA: Diagnosis not present

## 2020-08-05 ENCOUNTER — Telehealth: Payer: Self-pay | Admitting: Cardiovascular Disease

## 2020-08-05 NOTE — Telephone Encounter (Signed)
Left message to call back  

## 2020-08-05 NOTE — Telephone Encounter (Signed)
Lynn from Dr. Jacelyn Grip office called stating pt is requesting to switch back to losartan 50 mg BID instead of continuing with valsartan. She state pt is having to use PRN clonidine more often and feels losartan worked better. Jeani Hawking voiced Dr. Jacelyn Grip is ok with making change but wanted to consult with Dr. Gwenlyn Found first.

## 2020-08-05 NOTE — Telephone Encounter (Signed)
Pt c/o medication issue:  1. Name of Medication: valsartan (DIOVAN) 80 MG tablet  2. How are you currently taking this medication (dosage and times per day)? 1 tablet twice a day  3. Are you having a reaction (difficulty breathing--STAT)? no  4. What is your medication issue? Jeani Hawking from Dr. Jodi Mourning office states he wants to find out if the patient can go back on losartan instead of valsartan. She states the patient has been having constipation and feels the losartan worked better for her. She states Dr. Jacelyn Grip wants to put her back on losartan 50 mg twice a day. She states to call her back at: [phone]. She states this is her desk number and it is okay to leave a message.

## 2020-08-09 NOTE — Telephone Encounter (Signed)
Absolutely okay for her to switch back to losartan as long as she follows her blood pressures.

## 2020-08-12 MED ORDER — LOSARTAN POTASSIUM 50 MG PO TABS: 50.0000 mg | ORAL_TABLET | Freq: Two times a day (BID) | ORAL | 3 refills | Status: DC

## 2020-08-12 NOTE — Telephone Encounter (Signed)
Called pt regarding switching back to losartan 50mg  bid from valsartan 80mg  once daily. Explained to pt that I will send in the prescription and wait to hear back from the pharmacy on their losartan availability. Pt states if she hears back from them she will give our office a call.

## 2020-08-12 NOTE — Telephone Encounter (Signed)
Ok to use 100mg  and cut them in half for 50mg  BID. However pharmacy can make the dose is fine

## 2020-08-19 ENCOUNTER — Ambulatory Visit: Payer: Medicare Other | Admitting: Podiatry

## 2020-08-26 DIAGNOSIS — I10 Essential (primary) hypertension: Secondary | ICD-10-CM | POA: Diagnosis not present

## 2020-08-26 DIAGNOSIS — I2510 Atherosclerotic heart disease of native coronary artery without angina pectoris: Secondary | ICD-10-CM | POA: Diagnosis not present

## 2020-08-26 DIAGNOSIS — G459 Transient cerebral ischemic attack, unspecified: Secondary | ICD-10-CM | POA: Diagnosis not present

## 2020-08-26 DIAGNOSIS — M1710 Unilateral primary osteoarthritis, unspecified knee: Secondary | ICD-10-CM | POA: Diagnosis not present

## 2020-08-26 DIAGNOSIS — M1990 Unspecified osteoarthritis, unspecified site: Secondary | ICD-10-CM | POA: Diagnosis not present

## 2020-08-26 DIAGNOSIS — J4541 Moderate persistent asthma with (acute) exacerbation: Secondary | ICD-10-CM | POA: Diagnosis not present

## 2020-08-26 DIAGNOSIS — H25093 Other age-related incipient cataract, bilateral: Secondary | ICD-10-CM | POA: Diagnosis not present

## 2020-08-26 DIAGNOSIS — E782 Mixed hyperlipidemia: Secondary | ICD-10-CM | POA: Diagnosis not present

## 2020-08-29 ENCOUNTER — Encounter: Payer: Self-pay | Admitting: Podiatry

## 2020-08-29 ENCOUNTER — Other Ambulatory Visit: Payer: Self-pay

## 2020-08-29 ENCOUNTER — Ambulatory Visit (INDEPENDENT_AMBULATORY_CARE_PROVIDER_SITE_OTHER): Payer: Medicare Other | Admitting: Podiatry

## 2020-08-29 DIAGNOSIS — M79674 Pain in right toe(s): Secondary | ICD-10-CM | POA: Diagnosis not present

## 2020-08-29 DIAGNOSIS — M79675 Pain in left toe(s): Secondary | ICD-10-CM

## 2020-08-29 DIAGNOSIS — B351 Tinea unguium: Secondary | ICD-10-CM

## 2020-09-01 NOTE — Progress Notes (Signed)
Subjective:   Patient ID: Kendra [name], female   DOB: 85 y.o.   MRN: [id number]   HPI Patient presents with elongated nailbeds 1-5 both feet that are thick and impossible for her to cut and become painful with shoe gear especially the corners of the nails    ROS      Objective:  Physical Exam  Neurovascular status was found to be intact with patient noted to have thick yellow brittle nailbeds 1-5 both feet that do become painful due to length and thickness on the dorsal surface of the beds     Assessment:  Chronic mycotic nail infection 1-5 both feet     Plan:  Debride painful nailbeds 1-5 both feet no iatrogenic bleeding reappoint to check back again

## 2020-09-03 DIAGNOSIS — M1990 Unspecified osteoarthritis, unspecified site: Secondary | ICD-10-CM | POA: Diagnosis not present

## 2020-09-03 DIAGNOSIS — G459 Transient cerebral ischemic attack, unspecified: Secondary | ICD-10-CM | POA: Diagnosis not present

## 2020-09-03 DIAGNOSIS — I2510 Atherosclerotic heart disease of native coronary artery without angina pectoris: Secondary | ICD-10-CM | POA: Diagnosis not present

## 2020-09-03 DIAGNOSIS — H25093 Other age-related incipient cataract, bilateral: Secondary | ICD-10-CM | POA: Diagnosis not present

## 2020-09-03 DIAGNOSIS — M1710 Unilateral primary osteoarthritis, unspecified knee: Secondary | ICD-10-CM | POA: Diagnosis not present

## 2020-09-03 DIAGNOSIS — I10 Essential (primary) hypertension: Secondary | ICD-10-CM | POA: Diagnosis not present

## 2020-09-03 DIAGNOSIS — R739 Hyperglycemia, unspecified: Secondary | ICD-10-CM | POA: Diagnosis not present

## 2020-09-03 DIAGNOSIS — J4541 Moderate persistent asthma with (acute) exacerbation: Secondary | ICD-10-CM | POA: Diagnosis not present

## 2020-09-03 DIAGNOSIS — L253 Unspecified contact dermatitis due to other chemical products: Secondary | ICD-10-CM | POA: Diagnosis not present

## 2020-09-03 DIAGNOSIS — E782 Mixed hyperlipidemia: Secondary | ICD-10-CM | POA: Diagnosis not present

## 2020-09-04 DIAGNOSIS — Z961 Presence of intraocular lens: Secondary | ICD-10-CM | POA: Diagnosis not present

## 2020-09-04 DIAGNOSIS — H04123 Dry eye syndrome of bilateral lacrimal glands: Secondary | ICD-10-CM | POA: Diagnosis not present

## 2020-09-10 ENCOUNTER — Ambulatory Visit: Payer: Medicare Other

## 2020-09-12 DIAGNOSIS — K219 Gastro-esophageal reflux disease without esophagitis: Secondary | ICD-10-CM | POA: Diagnosis not present

## 2020-09-12 DIAGNOSIS — R221 Localized swelling, mass and lump, neck: Secondary | ICD-10-CM | POA: Diagnosis not present

## 2020-09-14 DIAGNOSIS — Z23 Encounter for immunization: Secondary | ICD-10-CM | POA: Diagnosis not present

## 2020-09-16 ENCOUNTER — Other Ambulatory Visit: Payer: Self-pay | Admitting: Otolaryngology

## 2020-09-16 DIAGNOSIS — R221 Localized swelling, mass and lump, neck: Secondary | ICD-10-CM

## 2020-09-19 DIAGNOSIS — G459 Transient cerebral ischemic attack, unspecified: Secondary | ICD-10-CM | POA: Diagnosis not present

## 2020-09-19 DIAGNOSIS — M1710 Unilateral primary osteoarthritis, unspecified knee: Secondary | ICD-10-CM | POA: Diagnosis not present

## 2020-09-19 DIAGNOSIS — I2510 Atherosclerotic heart disease of native coronary artery without angina pectoris: Secondary | ICD-10-CM | POA: Diagnosis not present

## 2020-09-19 DIAGNOSIS — J4541 Moderate persistent asthma with (acute) exacerbation: Secondary | ICD-10-CM | POA: Diagnosis not present

## 2020-09-19 DIAGNOSIS — I10 Essential (primary) hypertension: Secondary | ICD-10-CM | POA: Diagnosis not present

## 2020-09-19 DIAGNOSIS — M1990 Unspecified osteoarthritis, unspecified site: Secondary | ICD-10-CM | POA: Diagnosis not present

## 2020-09-19 DIAGNOSIS — E782 Mixed hyperlipidemia: Secondary | ICD-10-CM | POA: Diagnosis not present

## 2020-09-19 DIAGNOSIS — H25093 Other age-related incipient cataract, bilateral: Secondary | ICD-10-CM | POA: Diagnosis not present

## 2020-10-04 ENCOUNTER — Ambulatory Visit
Admission: RE | Admit: 2020-10-04 | Discharge: 2020-10-04 | Disposition: A | Discharge status: Home / Self Care | Payer: Medicare Other | Source: Ambulatory Visit | Attending: Otolaryngology | Admitting: Otolaryngology

## 2020-10-04 ENCOUNTER — Other Ambulatory Visit: Payer: Self-pay

## 2020-10-04 DIAGNOSIS — J320 Chronic maxillary sinusitis: Secondary | ICD-10-CM | POA: Diagnosis not present

## 2020-10-04 DIAGNOSIS — I6523 Occlusion and stenosis of bilateral carotid arteries: Secondary | ICD-10-CM | POA: Diagnosis not present

## 2020-10-04 DIAGNOSIS — M19012 Primary osteoarthritis, left shoulder: Secondary | ICD-10-CM | POA: Diagnosis not present

## 2020-10-04 DIAGNOSIS — R221 Localized swelling, mass and lump, neck: Secondary | ICD-10-CM

## 2020-10-04 DIAGNOSIS — M19011 Primary osteoarthritis, right shoulder: Secondary | ICD-10-CM | POA: Diagnosis not present

## 2020-10-04 MED ORDER — IOPAMIDOL (ISOVUE-370) INJECTION 76%
75.0000 mL | Freq: Once | INTRAVENOUS | Status: AC | PRN
  Administered 2020-10-04: 75 mL via INTRAVENOUS

## 2020-10-07 ENCOUNTER — Ambulatory Visit: Payer: Medicare Other | Admitting: Cardiology

## 2020-10-07 ENCOUNTER — Encounter: Payer: Self-pay | Admitting: Cardiology

## 2020-10-07 ENCOUNTER — Other Ambulatory Visit: Payer: Self-pay

## 2020-10-07 VITALS — BP 209/74 | HR 64 | Temp 98.0°F | Resp 18 | Ht 65.5 in | Wt 124.6 lb

## 2020-10-07 DIAGNOSIS — E7800 Pure hypercholesterolemia, unspecified: Secondary | ICD-10-CM | POA: Diagnosis not present

## 2020-10-07 DIAGNOSIS — I6522 Occlusion and stenosis of left carotid artery: Secondary | ICD-10-CM | POA: Diagnosis not present

## 2020-10-07 DIAGNOSIS — I2510 Atherosclerotic heart disease of native coronary artery without angina pectoris: Secondary | ICD-10-CM | POA: Diagnosis not present

## 2020-10-07 DIAGNOSIS — R0989 Other specified symptoms and signs involving the circulatory and respiratory systems: Secondary | ICD-10-CM | POA: Diagnosis not present

## 2020-10-07 DIAGNOSIS — I10 Essential (primary) hypertension: Secondary | ICD-10-CM

## 2020-10-07 DIAGNOSIS — R001 Bradycardia, unspecified: Secondary | ICD-10-CM | POA: Diagnosis not present

## 2020-10-07 MED ORDER — PINDOLOL 5 MG PO TABS
5.0000 mg | ORAL_TABLET | Freq: Two times a day (BID) | ORAL | 2 refills | Status: DC
Start: 2020-10-07 — End: 2020-11-19

## 2020-10-07 MED ORDER — CHLORTHALIDONE 25 MG PO TABS: 12.5000 mg | ORAL_TABLET | ORAL | 2 refills | Status: DC

## 2020-10-07 NOTE — Progress Notes (Signed)
Primary Physician/Referring:  Vernie Shanks, MD  Patient ID: Kendra [name], female    DOB: 1935-12-17, 85 y.o.   MRN: [id number]  Chief Complaint  Patient presents with  . Hypertension  . Coronary Artery Disease  . New Patient (Initial Visit)    Referred by [name], MD   HPI:    Kendra [name]  is a 85 y.o. Caucasian female patient with Coronary disease with history of LAD stents performed in 2008 at [facility] clinic, asymptomatic left carotid artery stenosis,  hypertension, hyperlipidemia, history of reactive airway disease with bronchial asthma referred to me for evaluation of coronary artery disease, management of hypertension and to establish care with me (previously saw Dr. Quay Burow).  Patient wanted to change practices, states that she has been concerned about recent elevation in blood pressure.  She was previously taking clonidine on a as needed basis but for the past 6 months she has been taking clonidine on a regular basis every evening.  She is concerned about its use in view of her age but states that it works well.  She also has severe constipation and hence has difficulty with selecting antihypertensive medications.  She is also having difficulty with hydralazine although has been taking it for several years, states that it causes her to have frequent headaches.  There is no dizziness or syncope.  From cardiac standpoint she has not had any recurrence of angina pectoris since LAD stenting in 2008.  Denies symptoms of TIA or claudication.  Past Medical History:  Diagnosis Date  . Asthma   . CAD (coronary artery disease)    last cath 08/2006  . Carotid artery disease (HCC)    moderate left ICA stenosis by 2.6 ultrasound  . DJD (degenerative joint disease), lumbar   . Hearing loss   . HTN (hypertension)   . Hyperlipidemia   . Vision loss of right eye    Past Surgical History:  Procedure Laterality Date  . ABDOMINAL HYSTERECTOMY  1975  .  APPENDECTOMY    . CORONARY ANGIOPLASTY WITH STENT PLACEMENT  08/2006   taxus stent x2 to LAD, 2.5x63m and 2.5x867m done [facility]. [facility]. TONSILLECTOMY  1994   Family History  Problem Relation Age of Onset  . Heart attack Mother        x3; age 10530. Cancer Mother        colon  . Heart attack Father   . Diabetes Father   . Cancer Maternal Grandmother        breast  . Breast cancer Maternal Grandmother   . Heart attack Paternal Grandfather   . Hyperlipidemia Sister     Social History   Tobacco Use  . Smoking status: Former Smoker    Types: Cigarettes    Quit date: 06/08/1966    Years since quitting: 54.3  . Smokeless tobacco: Never Used  Substance Use Topics  . Alcohol use: Not Currently    Alcohol/week: 1.0 standard drink    Types: 1 Standard drinks or equivalent per week    Comment: wine   Marital Status: Married  ROS  Review of Systems  Cardiovascular: Positive for leg swelling (occasional). Negative for chest pain and dyspnea on exertion.  Musculoskeletal: Positive for arthritis.  Gastrointestinal: Positive for constipation. Negative for melena.   Objective  Blood pressure (!) 209/74, pulse 64, temperature 98 F (36.7 C), temperature source Temporal, resp.  rate 18, height 5' 5.5" (1.664 m), weight 124 lb 9.6 oz (56.5 kg), SpO2 98 %. Body mass index is 20.42 kg/m.  Vitals with BMI 10/07/2020 07/09/2020 07/09/2020  Height 5' 5.5" - 5' 5.5"  Weight 124 lbs 10 oz - 122 lbs 10 oz  BMI 74.94 - 49.67  Systolic 591 638 466  Diastolic 74 70 70  Pulse 64 - 58     Physical Exam  Constitutional: No distress.  Eyes: Conjunctivae are normal.  Neck: No JVD present.  Cardiovascular: Regular rhythm, intact distal pulses and normal pulses. Exam reveals no gallop.  Murmur heard.  Early systolic murmur is present with a grade of 2/6. Pulses:      Carotid pulses are on the right side with bruit and on the left side  with bruit. Epigastric bruit present.  Pulmonary/Chest: Effort normal and breath sounds normal. She exhibits no tenderness.  Abdominal: Soft. Bowel sounds are normal.  Musculoskeletal:        General: No edema. Normal range of motion.     Cervical back: Neck supple.  Neurological: She is alert and oriented to person, place, and time.  Skin: Skin is warm.     Laboratory examination:   No results for input(s): NA, K, CL, CO2, GLUCOSE, BUN, CREATININE, CALCIUM, GFRNONAA, GFRAA in the last 8760 hours. CrCl cannot be calculated (Patient's most recent lab result is older than the maximum 21 days allowed.).  CMP Latest Ref Rng & Units 11/04/2013  Glucose 70 - 99 mg/dL 97  BUN 6 - 23 mg/dL 15  Creatinine 0.50 - 1.10 mg/dL 0.62  Sodium 137 - 147 mEq/L 139  Potassium 3.7 - 5.3 mEq/L 5.1  Chloride 96 - 112 mEq/L 100  CO2 19 - 32 mEq/L 26  Calcium 8.4 - 10.5 mg/dL 9.7   CBC Latest Ref Rng & Units 11/04/2013  WBC 4.0 - 10.5 K/uL 8.5  Hemoglobin 12.0 - 15.0 g/dL 13.6  Hematocrit 36.0 - 46.0 % 39.5  Platelets 150 - 400 K/uL 233    Lipid Panel No results for input(s): CHOL, TRIG, LDLCALC, VLDL, HDL, CHOLHDL, LDLDIRECT in the last 8760 hours. Lipid Panel  No results found for: CHOL, TRIG, HDL, CHOLHDL, VLDL, LDLCALC, LDLDIRECT, LABVLDL   HEMOGLOBIN A1C No results found for: HGBA1C, MPG TSH No results for input(s): TSH in the last 8760 hours.  External labs:   Labs 05/10/2020:  Serum glucose 86 mg, BUN 14, creatinine 0.60, EGFR 96 mL, potassium 5.1, CMP otherwise normal.  Total cholesterol 191, triglycerides 68, HDL 80, LDL 98.  Non-HDL cholesterol 111.  Hb 14.2/HCT 42.3, platelets 222.  Medications and allergies   Allergies  Allergen Reactions  . Other     Other reaction(s): Respiratory Distress  . Amlodipine     "shivers"   . Ciprofloxacin   . Dog Epithelium Allergy Skin Test   . Hydralazine Other (See Comments)    Headache  . Latex Other (See Comments)    Irritates her  skin  . Statins     Unable to tolerate statins w the exception of crestor.  . Sulfa Antibiotics Other (See Comments)    "didnt feel good"  . Sulfamethoxazole Other (See Comments)    Makes her feel bad  . Zetia [Ezetimibe] Other (See Comments)    "makes me constipated"  . Penicillins Rash     Current Outpatient Medications on File Prior to Visit  Medication Sig Dispense Refill  . albuterol (VENTOLIN HFA) 108 (90 Base) MCG/ACT inhaler Inhale  2 puffs into the lungs every 12 (twelve) hours.    . Ascorbic Acid (VITAMIN C) 1000 MG tablet Take 1,000 mg by mouth daily.    [name] aspirin EC 81 MG tablet Take 81 mg by mouth daily. Swallow whole.    . beclomethasone (QVAR) 80 MCG/ACT inhaler Inhale 1-2 puffs into the lungs 2 (two) times daily.    [name] BIOTIN 5000 PO Take 1 tablet by mouth daily.    [name] BLACK ELDERBERRY PO Take 575 mg by mouth daily.    . calcium carbonate (OS-CAL - DOSED IN MG OF ELEMENTAL CALCIUM) 1250 (500 Ca) MG tablet Take 1 tablet by mouth daily.    . cholecalciferol (VITAMIN D) 1000 UNITS tablet Take 1,000 Units by mouth daily.    . clobetasol (OLUX) 0.05 % topical foam Apply 1 application topically every 3 (three) days.    . cloNIDine (CATAPRES) 0.1 MG tablet Take 0.1 mg by mouth daily as needed (high BP > 170). Taking 1/2 to 1 tablet daily as needed for SBP > 170.     [name] Coenzyme Q10 (CO Q-10) 100 MG CAPS Take 100 mg by mouth daily.     [name] docusate sodium (COLACE) 100 MG capsule Take 100 mg by mouth daily as needed.    . fluticasone (FLONASE) 50 MCG/ACT nasal spray Place 1 spray into both nostrils daily as needed.    . Homeopathic Products (ARNICA EX) Apply topically as needed. Sore areas    . ketoconazole (NIZORAL) 2 % cream Apply 1 application topically daily. Apply to toe    . losartan (COZAAR) 50 MG tablet Take 1 tablet (50 mg total) by mouth in the morning and at bedtime. 180 tablet 3  . Multiple Vitamin (MULTIVITAMIN) capsule Take 1 capsule by mouth daily.    . rosuvastatin  (CRESTOR) 10 MG tablet Take 10 mg by mouth every other day.    . triamcinolone (KENALOG) 0.1 % Apply 1 application topically 2 (two) times daily as needed.    . Wheat Dextrin (BENEFIBER DRINK MIX PO) Take 1 Dose by mouth daily as needed.     No current facility-administered medications on file prior to visit.     Radiology:   No results found.  Cardiac Studies:   Coron[facility] at [facility]: PCI to the LAD with implantation of 2 overlapping 2.5 x 16 mm and a 2.5 x 8 mm Taxus express DES.  Echocardiogram 04/10/2020:  1. Left ventricular ejection fraction, by estimation, is 60 to 65%. The left ventricle has normal function. The left ventricle has no regional wall motion abnormalities. Left ventricular diastolic parameters are consistent with Grade I diastolic  dysfunction (impaired relaxation). Elevated left ventricular end-diastolic pressure.  2. Right ventricular systolic function is normal. The right ventricular size is normal. There is normal pulmonary artery systolic pressure.  3. Left atrial size was mildly dilated.  4. The mitral valve is normal in structure. Trivial mitral valve regurgitation. No evidence of mitral stenosis. Moderate mitral annular calcification.  5. The aortic valve is tricuspid. Aortic valve regurgitation is not visualized. No aortic stenosis is present.  6. The inferior vena cava is normal in size with greater than 50% respiratory variability, suggesting right atrial pressure of 3 mmHg.  Lower extremity venous insufficiency study left lower extremity 05/30/2020:  - No evidence of deep vein thrombosis seen in the left lower extremity, from the common femoral through the popliteal vein.  - 2.65 x 1.17 cm fluid collection medial knee area.  -  No evidence of superficial venous reflux seen in the left greater  saphenous vein.  - No evidence of superficial venous reflux seen in the left short  saphenous vein.  - Venous reflux is noted in the left  common femoral vein.  - Venous reflux is noted in the left femoral vein.  Lower extremity arterial duplex 05/30/2020: Triphasic waveforms throughout the bilateral lower extremity.  Normal bilateral ABI and normal bilateral toe brachial index.  Carotid artery duplex 07/05/2020: Right Carotid: Velocities in the right ICA are consistent with a 1-39% stenosis. Left Carotid: Velocities in the left ICA are consistent with a 40-59% stenosis. Vertebrals:  Bilateral vertebral arteries demonstrate antegrade flow. Subclavians: Normal flow hemodynamics were seen in bilateral subclavian arteries.  EKG:     EKG 10/07/2020: Marked sinus bradycardia at rate of 51 bpm, borderline left atrial enlargement, otherwise normal EKG. No significant change from 03/13/2020, sinus bradycardia at rate of 51 bpm.  Assessment     ICD-10-CM   1. Resistant hypertension  I10 EKG 12-Lead    PCV RENAL/RENAL ARTERY DUPLEX COMPLETE    pindolol (VISKEN) 5 MG tablet    chlorthalidone (HYGROTON) 25 MG tablet    CANCELED: Basic metabolic panel  2. Asymptomatic stenosis of left carotid artery  I65.22   3. Coronary artery disease involving native coronary artery of native heart without angina pectoris  I25.10   4. Abdominal bruit  R09.89 PCV RENAL/RENAL ARTERY DUPLEX COMPLETE  5. Bradycardia by electrocardiogram  R00.1   6. Pure hypercholesterolemia  E78.00      Medications Discontinued During This Encounter  Medication Reason  . hydrALAZINE (APRESOLINE) 25 MG tablet Error  . losartan (COZAAR) 50 MG tablet Error  . hydrALAZINE (APRESOLINE) 10 MG tablet Side effect (s)    Meds ordered this encounter  Medications  . pindolol (VISKEN) 5 MG tablet    Sig: Take 1 tablet (5 mg total) by mouth 2 (two) times daily.    Dispense:  60 tablet    Refill:  2  . chlorthalidone (HYGROTON) 25 MG tablet    Sig: Take 0.5 tablets (12.5 mg total) by mouth every morning.    Dispense:  15 tablet    Refill:  2   Orders Placed This  Encounter  Procedures  . EKG 12-Lead   Recommendations:   Kendra [name] is a 85 y.o. Caucasian female patient with Coronary disease with history of LAD stents performed in 2008 at [facility] clinic, asymptomatic left carotid artery stenosis,  hypertension, hyperlipidemia, history of reactive airway disease with bronchial asthma referred to me for evaluation of coronary artery disease, management of hypertension and to establish care with me (previously saw Dr. Quay Burow).  Patient's blood pressure over the past 6 months has been very difficult to control and she has started taking clonidine on a regular basis.  Advised her that in view of her age and also marked bradycardia, try to avoid taking clonidine unless absolutely necessary.  Patient is very careful and keen on monitoring her blood pressure and also her health status.  I would like to try pindolol as it does not cause significant bradycardia, could not use amlodipine due to severe constipation, could consider using Procardia if blood pressure is still not well controlled.  I would also like to try chlorthalidone 12.5 mg in the morning.  I wanted to obtain BMP in 2 weeks however patient has been scheduled for complete blood test with her PCP in 3 weeks which should be sufficient for  me.  In view of resistant hypertension, prominent abdominal bruit, will obtain renal artery duplex.  There has been some acceleration of blood pressure over the past 6 months.  With regard to coronary artery disease and carotid disease, she remains asymptomatic without recurrence of angina and I will switch over her carotid artery duplex surveillance to our office as she is establishing care with me, I will set this up on her next office visit to be done in a year.  I would like to see her back in 6 weeks.  This was a 60-minute encounter, with regard to evaluation of her external medical records, external tests, evaluation of labs and discussion with the  patient regarding previously performed test.    Adrian Prows, MD, [facility] 10/07/2020, 4:55 PM Office: [phone]

## 2020-10-11 ENCOUNTER — Telehealth: Payer: Self-pay

## 2020-10-11 NOTE — Telephone Encounter (Signed)
Pt called because she has been having issues with her pindolol. She stated that she Has been feeling nauseous, has been having heart burn, and has been feeling tired. No dizziness or other symptoms. Pt stated that she is also confused as to why she is taking the chlorthalidone when she does not have a salt problem and does not have any swelling.

## 2020-10-12 NOTE — Telephone Encounter (Signed)
I sent her this message:  Please continue with the medications I have prescribed, I would like you to see if I will be able to control your blood pressure better and eventually you will get used to the medications.  Chlorthalidone and hydrochlorothiazide are used not for fluid excess or swelling but for high blood pressure.  They only work as water pills for a short time and then only work as blood pressure medications.  Continue all the medicines for now and I will be seeing his own.  If able, please use MyChart messaging instead of telephone calls but he may certainly call us.

## 2020-10-14 NOTE — Telephone Encounter (Signed)
Attempted to call pt, no answer. Unable to leave vm.

## 2020-10-16 NOTE — Telephone Encounter (Signed)
Called spoke to pt regarding medications. Pt voiced understanding.

## 2020-10-17 ENCOUNTER — Other Ambulatory Visit: Payer: Medicare Other

## 2020-10-17 ENCOUNTER — Ambulatory Visit: Payer: Medicare Other

## 2020-10-17 ENCOUNTER — Other Ambulatory Visit: Payer: Self-pay

## 2020-10-17 DIAGNOSIS — I10 Essential (primary) hypertension: Secondary | ICD-10-CM | POA: Diagnosis not present

## 2020-10-17 DIAGNOSIS — R0989 Other specified symptoms and signs involving the circulatory and respiratory systems: Secondary | ICD-10-CM

## 2020-10-19 NOTE — Progress Notes (Signed)
Renal duplex suggest moderate bilateral renal artery stenosis, will discuss with patient on office visit.

## 2020-10-25 DIAGNOSIS — I10 Essential (primary) hypertension: Secondary | ICD-10-CM | POA: Diagnosis not present

## 2020-10-25 DIAGNOSIS — H25093 Other age-related incipient cataract, bilateral: Secondary | ICD-10-CM | POA: Diagnosis not present

## 2020-10-25 DIAGNOSIS — I2510 Atherosclerotic heart disease of native coronary artery without angina pectoris: Secondary | ICD-10-CM | POA: Diagnosis not present

## 2020-10-25 DIAGNOSIS — G459 Transient cerebral ischemic attack, unspecified: Secondary | ICD-10-CM | POA: Diagnosis not present

## 2020-10-25 DIAGNOSIS — E782 Mixed hyperlipidemia: Secondary | ICD-10-CM | POA: Diagnosis not present

## 2020-10-25 DIAGNOSIS — M1990 Unspecified osteoarthritis, unspecified site: Secondary | ICD-10-CM | POA: Diagnosis not present

## 2020-10-25 DIAGNOSIS — M1710 Unilateral primary osteoarthritis, unspecified knee: Secondary | ICD-10-CM | POA: Diagnosis not present

## 2020-10-25 DIAGNOSIS — J4541 Moderate persistent asthma with (acute) exacerbation: Secondary | ICD-10-CM | POA: Diagnosis not present

## 2020-10-30 ENCOUNTER — Other Ambulatory Visit: Payer: Self-pay

## 2020-10-30 ENCOUNTER — Encounter: Payer: Self-pay | Admitting: Podiatry

## 2020-10-30 ENCOUNTER — Ambulatory Visit (INDEPENDENT_AMBULATORY_CARE_PROVIDER_SITE_OTHER): Payer: Medicare Other | Admitting: Podiatry

## 2020-10-30 DIAGNOSIS — M542 Cervicalgia: Secondary | ICD-10-CM | POA: Insufficient documentation

## 2020-10-30 DIAGNOSIS — B351 Tinea unguium: Secondary | ICD-10-CM | POA: Diagnosis not present

## 2020-10-30 DIAGNOSIS — R1013 Epigastric pain: Secondary | ICD-10-CM | POA: Insufficient documentation

## 2020-10-30 DIAGNOSIS — I6522 Occlusion and stenosis of left carotid artery: Secondary | ICD-10-CM

## 2020-10-30 DIAGNOSIS — L253 Unspecified contact dermatitis due to other chemical products: Secondary | ICD-10-CM | POA: Insufficient documentation

## 2020-10-30 DIAGNOSIS — M79675 Pain in left toe(s): Secondary | ICD-10-CM | POA: Diagnosis not present

## 2020-10-30 DIAGNOSIS — I83893 Varicose veins of bilateral lower extremities with other complications: Secondary | ICD-10-CM | POA: Insufficient documentation

## 2020-10-30 DIAGNOSIS — H9190 Unspecified hearing loss, unspecified ear: Secondary | ICD-10-CM | POA: Insufficient documentation

## 2020-10-30 DIAGNOSIS — I872 Venous insufficiency (chronic) (peripheral): Secondary | ICD-10-CM | POA: Diagnosis not present

## 2020-10-30 DIAGNOSIS — R6889 Other general symptoms and signs: Secondary | ICD-10-CM | POA: Insufficient documentation

## 2020-10-30 DIAGNOSIS — M1990 Unspecified osteoarthritis, unspecified site: Secondary | ICD-10-CM | POA: Insufficient documentation

## 2020-10-30 DIAGNOSIS — K5900 Constipation, unspecified: Secondary | ICD-10-CM | POA: Insufficient documentation

## 2020-10-30 DIAGNOSIS — R600 Localized edema: Secondary | ICD-10-CM | POA: Insufficient documentation

## 2020-10-30 DIAGNOSIS — M722 Plantar fascial fibromatosis: Secondary | ICD-10-CM | POA: Insufficient documentation

## 2020-10-30 DIAGNOSIS — M21221 Flexion deformity, right elbow: Secondary | ICD-10-CM | POA: Insufficient documentation

## 2020-10-30 DIAGNOSIS — Z789 Other specified health status: Secondary | ICD-10-CM | POA: Insufficient documentation

## 2020-10-30 DIAGNOSIS — Z1211 Encounter for screening for malignant neoplasm of colon: Secondary | ICD-10-CM | POA: Insufficient documentation

## 2020-10-30 DIAGNOSIS — M25521 Pain in right elbow: Secondary | ICD-10-CM | POA: Insufficient documentation

## 2020-10-30 DIAGNOSIS — R7303 Prediabetes: Secondary | ICD-10-CM | POA: Insufficient documentation

## 2020-10-30 DIAGNOSIS — M1710 Unilateral primary osteoarthritis, unspecified knee: Secondary | ICD-10-CM | POA: Insufficient documentation

## 2020-10-30 DIAGNOSIS — J4541 Moderate persistent asthma with (acute) exacerbation: Secondary | ICD-10-CM | POA: Insufficient documentation

## 2020-10-30 DIAGNOSIS — M791 Myalgia, unspecified site: Secondary | ICD-10-CM | POA: Insufficient documentation

## 2020-10-30 DIAGNOSIS — R252 Cramp and spasm: Secondary | ICD-10-CM | POA: Insufficient documentation

## 2020-10-30 DIAGNOSIS — H903 Sensorineural hearing loss, bilateral: Secondary | ICD-10-CM | POA: Insufficient documentation

## 2020-10-30 DIAGNOSIS — L989 Disorder of the skin and subcutaneous tissue, unspecified: Secondary | ICD-10-CM | POA: Insufficient documentation

## 2020-10-30 DIAGNOSIS — M25519 Pain in unspecified shoulder: Secondary | ICD-10-CM | POA: Insufficient documentation

## 2020-10-30 DIAGNOSIS — R194 Change in bowel habit: Secondary | ICD-10-CM | POA: Insufficient documentation

## 2020-10-30 DIAGNOSIS — M79674 Pain in right toe(s): Secondary | ICD-10-CM

## 2020-10-30 DIAGNOSIS — M9390 Osteochondropathy, unspecified of unspecified site: Secondary | ICD-10-CM | POA: Insufficient documentation

## 2020-10-30 DIAGNOSIS — H25099 Other age-related incipient cataract, unspecified eye: Secondary | ICD-10-CM | POA: Insufficient documentation

## 2020-10-30 DIAGNOSIS — L659 Nonscarring hair loss, unspecified: Secondary | ICD-10-CM | POA: Insufficient documentation

## 2020-10-30 DIAGNOSIS — R739 Hyperglycemia, unspecified: Secondary | ICD-10-CM | POA: Insufficient documentation

## 2020-10-30 DIAGNOSIS — K5730 Diverticulosis of large intestine without perforation or abscess without bleeding: Secondary | ICD-10-CM | POA: Insufficient documentation

## 2020-10-30 DIAGNOSIS — K5792 Diverticulitis of intestine, part unspecified, without perforation or abscess without bleeding: Secondary | ICD-10-CM | POA: Insufficient documentation

## 2020-10-30 DIAGNOSIS — J3489 Other specified disorders of nose and nasal sinuses: Secondary | ICD-10-CM | POA: Insufficient documentation

## 2020-10-30 DIAGNOSIS — R0989 Other specified symptoms and signs involving the circulatory and respiratory systems: Secondary | ICD-10-CM | POA: Insufficient documentation

## 2020-10-30 DIAGNOSIS — R062 Wheezing: Secondary | ICD-10-CM | POA: Insufficient documentation

## 2020-10-30 NOTE — Progress Notes (Signed)
This patient returns to my office for at risk foot care.  This patient requires this care by a professional since this patient will be at risk due to having venous insufficiency.    This patient is unable to cut nails herself since the patient cannot reach her nails.These nails are painful walking and wearing shoes. She is concerned about having fungal infected nails and desires to discuss future treatment. This patient presents for at risk foot care today.  General Appearance  Alert, conversant and in no acute stress.  Vascular  Dorsalis pedis and posterior tibial  pulses are palpable  bilaterally.  Capillary return is within normal limits  bilaterally. Temperature is within normal limits  bilaterally.  Neurologic  Senn-Weinstein monofilament wire test within normal limits  bilaterally. Muscle power within normal limits bilaterally.  Nails Thick disfigured discolored nails with subungual debris  from hallux to fifth toes bilaterally. No evidence of bacterial infection or drainage bilaterally.  Orthopedic  No limitations of motion  feet .  No crepitus or effusions noted.  No bony pathology or digital deformities noted.  Skin  normotropic skin with no porokeratosis noted bilaterally.  No signs of infections or ulcers noted.     Onychomycosis  Pain in right toes  Pain in left toes  Consent was obtained for treatment procedures.   Mechanical debridement of nails 1-5  bilaterally performed with a nail nipper.  Filed with dremel without incident. Discused her nails with this patient.  She was previously given a prescription of ketocanazole  but has additional questions.  She read about having laser treatment at this office for her fungal nails.  I told her that her nails do not have fungal characteristics.  I told her the nails appear to be traumatized.  Told her that she will need a lab test performed on her nails prior to treatment.  We also discussed topical medicine.  Finally she decided to purchase  medicated nail polish and return in 10 weeks for continued evaluation and treatment.   Return office visit   10 weeks                  Told patient to return for periodic foot care and evaluation due to potential at risk complications.   Gardiner Barefoot DPM

## 2020-11-14 DIAGNOSIS — H25093 Other age-related incipient cataract, bilateral: Secondary | ICD-10-CM | POA: Diagnosis not present

## 2020-11-14 DIAGNOSIS — I10 Essential (primary) hypertension: Secondary | ICD-10-CM | POA: Diagnosis not present

## 2020-11-14 DIAGNOSIS — E782 Mixed hyperlipidemia: Secondary | ICD-10-CM | POA: Diagnosis not present

## 2020-11-14 DIAGNOSIS — G459 Transient cerebral ischemic attack, unspecified: Secondary | ICD-10-CM | POA: Diagnosis not present

## 2020-11-14 DIAGNOSIS — I2510 Atherosclerotic heart disease of native coronary artery without angina pectoris: Secondary | ICD-10-CM | POA: Diagnosis not present

## 2020-11-14 DIAGNOSIS — M1710 Unilateral primary osteoarthritis, unspecified knee: Secondary | ICD-10-CM | POA: Diagnosis not present

## 2020-11-14 DIAGNOSIS — M1990 Unspecified osteoarthritis, unspecified site: Secondary | ICD-10-CM | POA: Diagnosis not present

## 2020-11-14 DIAGNOSIS — J4541 Moderate persistent asthma with (acute) exacerbation: Secondary | ICD-10-CM | POA: Diagnosis not present

## 2020-11-14 DIAGNOSIS — L253 Unspecified contact dermatitis due to other chemical products: Secondary | ICD-10-CM | POA: Diagnosis not present

## 2020-11-14 DIAGNOSIS — R739 Hyperglycemia, unspecified: Secondary | ICD-10-CM | POA: Diagnosis not present

## 2020-11-15 DIAGNOSIS — E871 Hypo-osmolality and hyponatremia: Secondary | ICD-10-CM | POA: Diagnosis not present

## 2020-11-19 ENCOUNTER — Ambulatory Visit: Payer: Medicare Other | Admitting: Cardiology

## 2020-11-19 ENCOUNTER — Encounter: Payer: Self-pay | Admitting: Cardiology

## 2020-11-19 ENCOUNTER — Other Ambulatory Visit: Payer: Self-pay

## 2020-11-19 VITALS — BP 167/68 | HR 60 | Temp 97.8°F | Resp 16 | Ht 65.5 in | Wt 121.8 lb

## 2020-11-19 DIAGNOSIS — I10 Essential (primary) hypertension: Secondary | ICD-10-CM | POA: Diagnosis not present

## 2020-11-19 DIAGNOSIS — I701 Atherosclerosis of renal artery: Secondary | ICD-10-CM

## 2020-11-19 DIAGNOSIS — I1A0 Resistant hypertension: Secondary | ICD-10-CM

## 2020-11-19 DIAGNOSIS — I2510 Atherosclerotic heart disease of native coronary artery without angina pectoris: Secondary | ICD-10-CM | POA: Diagnosis not present

## 2020-11-19 MED ORDER — HYDRALAZINE HCL 25 MG PO TABS: 25.0000 mg | ORAL_TABLET | Freq: Three times a day (TID) | ORAL | 2 refills | Status: DC

## 2020-11-19 MED ORDER — PINDOLOL 5 MG PO TABS
5.0000 mg | ORAL_TABLET | Freq: Two times a day (BID) | ORAL | 1 refills | Status: DC
Start: 2020-11-19 — End: 2021-01-20

## 2020-11-19 MED ORDER — ISOSORBIDE DINITRATE 30 MG PO TABS: 30.0000 mg | ORAL_TABLET | Freq: Three times a day (TID) | ORAL | 2 refills | Status: DC

## 2020-11-19 NOTE — Progress Notes (Signed)
Primary Physician/Referring:  Vernie Shanks, MD  Patient ID: Kendra [name], female    DOB: [birth year], 85 y.o.   MRN: [id number]  Chief Complaint  Patient presents with   Hypertension   Follow-up    6 weeks    HPI:    Kendra [name]  is a 85 y.o. Caucasian female patient with Coronary disease with history of LAD stents performed in 2008 at [facility] clinic, asymptomatic left carotid artery stenosis,  hypertension, hyperlipidemia, history of reactive airway disease with bronchial asthma referred to me for evaluation of coronary artery disease, management of hypertension and to establish care with me (previously saw Dr. Quay Burow).  She was previously taking clonidine on a as needed basis but for the past 6 months she has been taking clonidine on a regular basis every evening.  She established with me 2 months ago in May 2022.  I started her on pindolol which she is tolerating.  Patient has multiple medication intolerances.  She was also started on chlorthalidone previously by her PCP but developed severe hyponatremia.  She now presents to the office for follow-up.  Denies any chest pain, no PND or orthopnea, no headache or visual disturbances.  States that her blood pressure has improved since being on pindolol which he is tolerating.   Past Medical History:  Diagnosis Date   Asthma    CAD (coronary artery disease)    last cath 08/2006   Carotid artery disease (HCC)    moderate left ICA stenosis by 2.6 ultrasound   DJD (degenerative joint disease), lumbar    Hearing loss    HTN (hypertension)    Hyperlipidemia    Vision loss of right eye    Past Surgical History:  Procedure Laterality Date   ABDOMINAL HYSTERECTOMY  1975   APPENDECTOMY     CORONARY ANGIOPLASTY WITH STENT PLACEMENT  08/2006   taxus stent x2 to LAD, 2.5x40m and 2[facility]   Problem Relation Age of Onset   Heart attack Mother        x3; age 83 Cancer Mother        colon   Heart attack Father    Diabetes Father    Cancer Maternal Grandmother        breast   Breast cancer Maternal Grandmother    Heart attack Paternal Grandfather    Hyperlipidemia Sister     Social History   Tobacco Use   Smoking status: Former    Pack years: 0.00    Types: Cigarettes    Quit date: 06/08/1966    Years since quitting: 54.4   Smokeless tobacco: Never  Substance Use Topics   Alcohol use: Not Currently    Alcohol/week: 1.0 standard drink    Types: 1 Standard drinks or equivalent per week    Comment: wine   Marital Status: Married  ROS  Review of Systems  Cardiovascular:  Positive for leg swelling (occasional). Negative for chest pain and dyspnea on exertion.  Musculoskeletal:  Positive for arthritis.  Gastrointestinal:  Positive for constipation. Negative for melena.  Objective  Blood pressure (!) 167/68, pulse 60, temperature 97.8 F (36.6 C), temperature source Temporal, resp. rate 16, height 5' 5.5" (1.664 m), weight 121 lb 12.8 oz (55.2 kg), SpO2 98 %. Body mass index is 19.96 kg/m.  Vitals with BMI 11/19/2020 10/07/2020  07/09/2020  Height 5' 5.5" 5' 5.5" -  Weight 121 lbs 13 oz 124 lbs 10 oz -  BMI 61.95 09.32 -  Systolic 671 245 809  Diastolic 68 74 70  Pulse 60 64 -     Physical Exam  Constitutional: No distress.  Eyes: Conjunctivae are normal.  Neck: No JVD present.  Cardiovascular: Regular rhythm, intact distal pulses and normal pulses. Exam reveals no gallop.  Murmur heard. Early systolic murmur is present with a grade of 2/6. Pulses:      Carotid pulses are  on the right side with bruit and  on the left side with bruit. Epigastric bruit present.  Pulmonary/Chest: Effort normal and breath sounds normal. She exhibits no tenderness.  Abdominal: Soft. Bowel sounds are normal.  Musculoskeletal:        General: No edema. Normal range of motion.      Cervical back: Neck supple.  Neurological: She is alert and oriented to person, place, and time.  Skin: Skin is warm.    Laboratory examination:   No results for input(s): NA, K, CL, CO2, GLUCOSE, BUN, CREATININE, CALCIUM, GFRNONAA, GFRAA in the last 8760 hours. CrCl cannot be calculated (Patient's most recent lab result is older than the maximum 21 days allowed.).  CMP Latest Ref Rng & Units 11/04/2013  Glucose 70 - 99 mg/dL 97  BUN 6 - 23 mg/dL 15  Creatinine 0.50 - 1.10 mg/dL 0.62  Sodium 137 - 147 mEq/L 139  Potassium 3.7 - 5.3 mEq/L 5.1  Chloride 96 - 112 mEq/L 100  CO2 19 - 32 mEq/L 26  Calcium 8.4 - 10.5 mg/dL 9.7   CBC Latest Ref Rng & Units 11/04/2013  WBC 4.0 - 10.5 K/uL 8.5  Hemoglobin 12.0 - 15.0 g/dL 13.6  Hematocrit 36.0 - 46.0 % 39.5  Platelets 150 - 400 K/uL 233    Lipid Panel No results for input(s): CHOL, TRIG, LDLCALC, VLDL, HDL, CHOLHDL, LDLDIRECT in the last 8760 hours. Lipid Panel  No results found for: CHOL, TRIG, HDL, CHOLHDL, VLDL, LDLCALC, LDLDIRECT, LABVLDL   HEMOGLOBIN A1C No results found for: HGBA1C, MPG TSH No results for input(s): TSH in the last 8760 hours.  External labs:   Labs 05/10/2020:  Serum glucose 86 mg, BUN 14, creatinine 0.60, EGFR 96 mL, potassium 5.1, CMP otherwise normal.  Total cholesterol 191, triglycerides 68, HDL 80, LDL 98.  Non-HDL cholesterol 111.  Hb 14.2/HCT 42.3, platelets 222.  Medications and allergies   Allergies  Allergen Reactions   Chlorthalidone Other (See Comments)    Hyponatremia   Other     Other reaction(s): Respiratory Distress   Amlodipine     "shivers"    Ciprofloxacin    Dog Epithelium Allergy Skin Test    Latex Other (See Comments)    Irritates her skin   Statins     Unable to tolerate statins w the exception of crestor.   Sulfa Antibiotics Other (See Comments)    "didnt feel good"   Sulfamethoxazole Other (See Comments)    Makes her feel bad   Zetia [Ezetimibe] Other (See Comments)     "makes me constipated"   Hydralazine Other (See Comments)    Headache, stopped working   Penicillins Rash     Current Outpatient Medications on File Prior to Visit  Medication Sig Dispense Refill   albuterol (VENTOLIN HFA) 108 (90 Base) MCG/ACT inhaler Inhale 2 puffs into the lungs every 12 (twelve) hours.     Ascorbic Acid (VITAMIN C)  1000 MG tablet Take 1,000 mg by mouth daily.     aspirin EC 81 MG tablet Take 81 mg by mouth daily. Swallow whole.     beclomethasone (QVAR) 80 MCG/ACT inhaler Inhale 1-2 puffs into the lungs 2 (two) times daily.     BIOTIN 5000 PO Take 1 tablet by mouth daily.     BLACK ELDERBERRY PO Take 575 mg by mouth daily.     calcium carbonate (OS-CAL - DOSED IN MG OF ELEMENTAL CALCIUM) 1250 (500 Ca) MG tablet Take 1 tablet by mouth daily.     cholecalciferol (VITAMIN D) 1000 UNITS tablet Take 1,000 Units by mouth daily.     clindamycin (CLEOCIN) 300 MG capsule Take 300 mg by mouth 3 (three) times daily.     clobetasol (OLUX) 0.05 % topical foam Apply 1 application topically every 3 (three) days.     cloNIDine (CATAPRES) 0.1 MG tablet Take 0.1 mg by mouth daily as needed (high BP > 170). Taking 1/2 to 1 tablet daily as needed for SBP > 170.      Coenzyme Q10 (CO Q-10) 100 MG CAPS Take 100 mg by mouth daily.      docusate sodium (COLACE) 100 MG capsule Take 100 mg by mouth daily as needed.     fluticasone (FLONASE) 50 MCG/ACT nasal spray Place 1 spray into both nostrils daily as needed.     GAVILYTE-G 236 g solution SMARTSIG:Milliliter(s) By Mouth     Homeopathic Products (ARNICA EX) Apply topically as needed. Sore areas     ketoconazole (NIZORAL) 2 % cream Apply 1 application topically daily. Apply to toe     losartan (COZAAR) 50 MG tablet Take 1 tablet (50 mg total) by mouth in the morning and at bedtime. 180 tablet 3   Multiple Vitamin (MULTIVITAMIN) capsule Take 1 capsule by mouth daily.     rosuvastatin (CRESTOR) 10 MG tablet Take 10 mg by mouth every other  day.     triamcinolone (KENALOG) 0.1 % Apply 1 application topically 2 (two) times daily as needed.     Wheat Dextrin (BENEFIBER DRINK MIX PO) Take 1 Dose by mouth daily as needed.     No current facility-administered medications on file prior to visit.    Medications after today's visit: Current Outpatient Medications  Medication Instructions   albuterol (VENTOLIN HFA) 108 (90 Base) MCG/ACT inhaler 2 puffs, Inhalation, Every 12 hours   aspirin EC 81 mg, Oral, Daily, Swallow whole.    beclomethasone (QVAR) 80 MCG/ACT inhaler 1-2 puffs, Inhalation, 2 times daily   BIOTIN 5000 PO 1 tablet, Oral, Daily   BLACK ELDERBERRY PO 575 mg, Oral, Daily   calcium carbonate (OS-CAL - DOSED IN MG OF ELEMENTAL CALCIUM) 1250 (500 Ca) MG tablet 1 tablet, Oral, Daily   cholecalciferol (VITAMIN D) 1,000 Units, Daily   clindamycin (CLEOCIN) 300 mg, Oral, 3 times daily   clobetasol (OLUX) 0.05 % topical foam 1 application, every 72 hours   cloNIDine (CATAPRES) 0.1 mg, Oral, Daily PRN, Taking 1/2 to 1 tablet daily as needed for SBP > 170.    Co Q-10 100 mg, Daily   docusate sodium (COLACE) 100 mg, Oral, Daily PRN   fluticasone (FLONASE) 50 MCG/ACT nasal spray 1 spray, Each Nare, Daily PRN   GAVILYTE-G 236 g solution SMARTSIG:Milliliter(s) By Mouth   Homeopathic Products (ARNICA EX) Apply externally, As needed, Sore areas   hydrALAZINE (APRESOLINE) 25 mg, Oral, 3 times daily   isosorbide dinitrate (ISORDIL) 30 mg, Oral, 3  times daily   ketoconazole (NIZORAL) 2 % cream 1 application, Topical, Daily, Apply to toe   losartan (COZAAR) 50 mg, Oral, 2 times daily   Multiple Vitamin (MULTIVITAMIN) capsule 1 capsule, Daily   pindolol (VISKEN) 5 mg, Oral, 2 times daily   rosuvastatin (CRESTOR) 10 mg, Oral, Every other day   triamcinolone (KENALOG) 0.1 % 1 application, Topical, 2 times daily PRN   vitamin C 1,000 mg, Oral, Daily   Wheat Dextrin (BENEFIBER DRINK MIX PO) 1 Dose, Oral, Daily PRN      Radiology:    No results found.  Cardiac Studies:   Coronary angiogram 2008 at [facility]: PCI to the LAD with implantation of 2 overlapping 2.5 x 16 mm and a 2.5 x 8 mm Taxus express DES.  Echocardiogram 04/10/2020:  1. Left ventricular ejection fraction, by estimation, is 60 to 65%. The left ventricle has normal function. The left ventricle has no regional wall motion abnormalities. Left ventricular diastolic parameters are consistent with Grade I diastolic  dysfunction (impaired relaxation). Elevated left ventricular end-diastolic pressure.  2. Right ventricular systolic function is normal. The right ventricular size is normal. There is normal pulmonary artery systolic pressure.  3. Left atrial size was mildly dilated.  4. The mitral valve is normal in structure. Trivial mitral valve regurgitation. No evidence of mitral stenosis. Moderate mitral annular calcification.  5. The aortic valve is tricuspid. Aortic valve regurgitation is not visualized. No aortic stenosis is present.  6. The inferior vena cava is normal in size with greater than 50% respiratory variability, suggesting right atrial pressure of 3 mmHg.  Lower extremity venous insufficiency study left lower extremity 05/30/2020:  - No evidence of deep vein thrombosis seen in the left lower extremity, from the common femoral through the popliteal vein.  - 2.65 x 1.17 cm fluid collection medial knee area.  - No evidence of superficial venous reflux seen in the left greater  saphenous vein.  - No evidence of superficial venous reflux seen in the left short  saphenous vein.  - Venous reflux is noted in the left common femoral vein.  - Venous reflux is noted in the left femoral vein.  Lower extremity arterial duplex 05/30/2020: Triphasic waveforms throughout the bilateral lower extremity.  Normal bilateral ABI and normal bilateral toe brachial index.  Carotid artery duplex 07/05/2020: Right Carotid: Velocities in the right ICA are  consistent with a 1-39% stenosis.  Left Carotid: Velocities in the left ICA are consistent with a 40-59% stenosis.  Vertebrals:  Bilateral vertebral arteries demonstrate antegrade flow. Subclavians: Normal flow hemodynamics were seen in bilateral subclavian arteries.  Renal artery duplex  10/17/2020: Hemodynamically significant stenosis bilaterally. Left renal artery not well visualized. >50% stenosis bilaterally. Normal intrarenal vascular perfusion is noted in the right kidney. Renal length is within normal limits for right kidney. Abdominal aorta shows atherosclerotic changes and mild ectasia, consider dedicated aortic duplex if clinically indicated.  EKG:     EKG 10/07/2020: Marked sinus bradycardia at rate of 51 bpm, borderline left atrial enlargement, otherwise normal EKG. No significant change from 03/13/2020, sinus bradycardia at rate of 51 bpm.  Assessment     ICD-10-CM   1. Coronary artery disease involving native coronary artery of native heart without angina pectoris  I25.10     2. Resistant hypertension  I10 hydrALAZINE (APRESOLINE) 25 MG tablet    isosorbide dinitrate (ISORDIL) 30 MG tablet    pindolol (VISKEN) 5 MG tablet    3. Bilateral renal artery stenosis (  [place])  I70.1        Medications Discontinued During This Encounter  Medication Reason   chlorthalidone (HYGROTON) 25 MG tablet Error   pindolol (VISKEN) 5 MG tablet Reorder     Meds ordered this encounter  Medications   hydrALAZINE (APRESOLINE) 25 MG tablet    Sig: Take 1 tablet (25 mg total) by mouth 3 (three) times daily.    Dispense:  90 tablet    Refill:  2   isosorbide dinitrate (ISORDIL) 30 MG tablet    Sig: Take 1 tablet (30 mg total) by mouth 3 (three) times daily.    Dispense:  90 tablet    Refill:  2   pindolol (VISKEN) 5 MG tablet    Sig: Take 1 tablet (5 mg total) by mouth 2 (two) times daily.    Dispense:  180 tablet    Refill:  1    No orders of the defined types were placed in this  encounter.  Recommendations:   Kendra [name] is a 85 y.o. Caucasian female patient with Coronary disease with history of LAD stents performed in 2008 at [facility] clinic, asymptomatic left carotid artery stenosis,  hypertension, hyperlipidemia, history of reactive airway disease with bronchial asthma referred to me for evaluation of coronary artery disease, management of hypertension and to establish care with me (previously saw Dr. Quay Burow).  I saw her 2 months ago.  Patient's blood pressure over the past 6 months has been very difficult to control and she has started taking clonidine on a regular basis.  Advised her that in view of her age and also marked bradycardia, try to avoid taking clonidine unless absolutely necessary.  Patient is very careful and keen on monitoring her blood pressure and also her health status.  She has not had any angina pectoris, no clinical evidence of heart failure.  She is tolerating pindolol that had started previously, however do not want to use high-dose due to underlying bradycardia and advanced age.  Patient has multiple medication intolerances.  Renal artery duplex reviewed with the patient, she has suggestion of bilateral renal artery stenosis.  We discussed regarding trying different medications versus proceeding with renal angiography which is certainly invasive.  After long discussion, patient would like to try isosorbide dinitrate along with hydralazine combination to see if her blood pressure would improve.  She is very skeptical of making any medication changes and has multiple questions regarding use of hydralazine as she stated that it had stopped working.  She was also started previously on chlorthalidone by her PCP but developed severe hyponatremia.  I would like to see her back in 2 months for follow-up.  This was a 40-minute encounter with regard to discussions of treatment of primary and resistant hypertension, management of renal artery  stenosis and discussions regarding the procedure.    Adrian Prows, MD, [facility] 11/19/2020, 5:44 PM Office: [phone]

## 2020-11-21 DIAGNOSIS — I10 Essential (primary) hypertension: Secondary | ICD-10-CM | POA: Diagnosis not present

## 2020-11-21 DIAGNOSIS — G459 Transient cerebral ischemic attack, unspecified: Secondary | ICD-10-CM | POA: Diagnosis not present

## 2020-11-21 DIAGNOSIS — R7303 Prediabetes: Secondary | ICD-10-CM | POA: Diagnosis not present

## 2020-11-21 DIAGNOSIS — E871 Hypo-osmolality and hyponatremia: Secondary | ICD-10-CM | POA: Diagnosis not present

## 2020-11-21 DIAGNOSIS — I701 Atherosclerosis of renal artery: Secondary | ICD-10-CM | POA: Diagnosis not present

## 2020-11-21 DIAGNOSIS — I6522 Occlusion and stenosis of left carotid artery: Secondary | ICD-10-CM | POA: Diagnosis not present

## 2020-11-21 DIAGNOSIS — E782 Mixed hyperlipidemia: Secondary | ICD-10-CM | POA: Diagnosis not present

## 2020-11-21 DIAGNOSIS — Z789 Other specified health status: Secondary | ICD-10-CM | POA: Diagnosis not present

## 2020-11-22 DIAGNOSIS — G459 Transient cerebral ischemic attack, unspecified: Secondary | ICD-10-CM | POA: Diagnosis not present

## 2020-11-22 DIAGNOSIS — E871 Hypo-osmolality and hyponatremia: Secondary | ICD-10-CM | POA: Diagnosis not present

## 2020-11-22 DIAGNOSIS — E782 Mixed hyperlipidemia: Secondary | ICD-10-CM | POA: Diagnosis not present

## 2020-11-22 DIAGNOSIS — Z789 Other specified health status: Secondary | ICD-10-CM | POA: Diagnosis not present

## 2020-11-22 DIAGNOSIS — I701 Atherosclerosis of renal artery: Secondary | ICD-10-CM | POA: Diagnosis not present

## 2020-11-22 DIAGNOSIS — I6522 Occlusion and stenosis of left carotid artery: Secondary | ICD-10-CM | POA: Diagnosis not present

## 2020-11-22 DIAGNOSIS — R7303 Prediabetes: Secondary | ICD-10-CM | POA: Diagnosis not present

## 2020-11-22 DIAGNOSIS — I10 Essential (primary) hypertension: Secondary | ICD-10-CM | POA: Diagnosis not present

## 2020-12-13 DIAGNOSIS — H25093 Other age-related incipient cataract, bilateral: Secondary | ICD-10-CM | POA: Diagnosis not present

## 2020-12-13 DIAGNOSIS — I2510 Atherosclerotic heart disease of native coronary artery without angina pectoris: Secondary | ICD-10-CM | POA: Diagnosis not present

## 2020-12-13 DIAGNOSIS — E782 Mixed hyperlipidemia: Secondary | ICD-10-CM | POA: Diagnosis not present

## 2020-12-13 DIAGNOSIS — M1710 Unilateral primary osteoarthritis, unspecified knee: Secondary | ICD-10-CM | POA: Diagnosis not present

## 2020-12-13 DIAGNOSIS — G459 Transient cerebral ischemic attack, unspecified: Secondary | ICD-10-CM | POA: Diagnosis not present

## 2020-12-13 DIAGNOSIS — I701 Atherosclerosis of renal artery: Secondary | ICD-10-CM | POA: Diagnosis not present

## 2020-12-13 DIAGNOSIS — J4541 Moderate persistent asthma with (acute) exacerbation: Secondary | ICD-10-CM | POA: Diagnosis not present

## 2020-12-13 DIAGNOSIS — I10 Essential (primary) hypertension: Secondary | ICD-10-CM | POA: Diagnosis not present

## 2020-12-13 DIAGNOSIS — M1990 Unspecified osteoarthritis, unspecified site: Secondary | ICD-10-CM | POA: Diagnosis not present

## 2020-12-25 ENCOUNTER — Telehealth: Payer: Self-pay

## 2020-12-25 NOTE — Telephone Encounter (Signed)
Please ask her to give me a chance and see how she does after two weeks. Also ask her to reduce isosorbide dinitrate and hydralazine to twice daily instead of TID

## 2020-12-26 NOTE — Telephone Encounter (Signed)
Patient is aware 

## 2021-01-02 DIAGNOSIS — T148XXA Other injury of unspecified body region, initial encounter: Secondary | ICD-10-CM | POA: Diagnosis not present

## 2021-01-02 DIAGNOSIS — Z6820 Body mass index (BMI) 20.0-20.9, adult: Secondary | ICD-10-CM | POA: Diagnosis not present

## 2021-01-02 DIAGNOSIS — W19XXXA Unspecified fall, initial encounter: Secondary | ICD-10-CM | POA: Diagnosis not present

## 2021-01-02 DIAGNOSIS — M542 Cervicalgia: Secondary | ICD-10-CM | POA: Diagnosis not present

## 2021-01-02 DIAGNOSIS — M25551 Pain in right hip: Secondary | ICD-10-CM | POA: Diagnosis not present

## 2021-01-02 DIAGNOSIS — R42 Dizziness and giddiness: Secondary | ICD-10-CM | POA: Diagnosis not present

## 2021-01-03 ENCOUNTER — Telehealth: Payer: Self-pay

## 2021-01-03 NOTE — Telephone Encounter (Signed)
Pt called and said that the isosorbide is making her feel terrible. Headaches, throat irritation and light headedness within hours of taking it, but it has helped her BP, is there something else you can give her, she does not want to take it anymore. I asked her to use mychart but she didn't want to.

## 2021-01-03 NOTE — Telephone Encounter (Signed)
Center MyChart message  I am sorry that you have not gotten used to the medication yet and you still persist to have headache.  I am suspecting your blood pressure is much improved on this medication.  I would recommend that you cut the dose of isosorbide dinitrate 30 mg tablet in half and take it only twice a day and gradually increase it to whole pill 3 times a day.  This is a very good medication for your blood pressure and to protect your brain from strokes.

## 2021-01-06 ENCOUNTER — Other Ambulatory Visit: Payer: Self-pay | Admitting: Family Medicine

## 2021-01-06 DIAGNOSIS — S060X0A Concussion without loss of consciousness, initial encounter: Secondary | ICD-10-CM | POA: Diagnosis not present

## 2021-01-06 DIAGNOSIS — S300XXA Contusion of lower back and pelvis, initial encounter: Secondary | ICD-10-CM | POA: Diagnosis not present

## 2021-01-06 DIAGNOSIS — W010XXA Fall on same level from slipping, tripping and stumbling without subsequent striking against object, initial encounter: Secondary | ICD-10-CM | POA: Diagnosis not present

## 2021-01-06 DIAGNOSIS — Z1231 Encounter for screening mammogram for malignant neoplasm of breast: Secondary | ICD-10-CM

## 2021-01-06 DIAGNOSIS — M542 Cervicalgia: Secondary | ICD-10-CM | POA: Diagnosis not present

## 2021-01-09 ENCOUNTER — Other Ambulatory Visit: Payer: Self-pay | Admitting: Family Medicine

## 2021-01-09 ENCOUNTER — Other Ambulatory Visit: Payer: Self-pay

## 2021-01-09 ENCOUNTER — Ambulatory Visit
Admission: RE | Admit: 2021-01-09 | Discharge: 2021-01-09 | Disposition: A | Discharge status: Home / Self Care | Payer: Medicare Other | Source: Ambulatory Visit | Attending: Family Medicine | Admitting: Family Medicine

## 2021-01-09 DIAGNOSIS — M25551 Pain in right hip: Secondary | ICD-10-CM

## 2021-01-09 DIAGNOSIS — M1611 Unilateral primary osteoarthritis, right hip: Secondary | ICD-10-CM | POA: Diagnosis not present

## 2021-01-16 DIAGNOSIS — H35363 Drusen (degenerative) of macula, bilateral: Secondary | ICD-10-CM | POA: Diagnosis not present

## 2021-01-16 DIAGNOSIS — H524 Presbyopia: Secondary | ICD-10-CM | POA: Diagnosis not present

## 2021-01-16 DIAGNOSIS — H04123 Dry eye syndrome of bilateral lacrimal glands: Secondary | ICD-10-CM | POA: Diagnosis not present

## 2021-01-16 DIAGNOSIS — H40013 Open angle with borderline findings, low risk, bilateral: Secondary | ICD-10-CM | POA: Diagnosis not present

## 2021-01-19 ENCOUNTER — Emergency Department (HOSPITAL_BASED_OUTPATIENT_CLINIC_OR_DEPARTMENT_OTHER): Payer: Medicare Other | Admitting: Radiology

## 2021-01-19 ENCOUNTER — Encounter (HOSPITAL_BASED_OUTPATIENT_CLINIC_OR_DEPARTMENT_OTHER): Payer: Self-pay | Admitting: Emergency Medicine

## 2021-01-19 ENCOUNTER — Other Ambulatory Visit: Payer: Self-pay

## 2021-01-19 ENCOUNTER — Emergency Department (HOSPITAL_BASED_OUTPATIENT_CLINIC_OR_DEPARTMENT_OTHER)
Admission: EM | Admit: 2021-01-19 | Discharge: 2021-01-19 | Disposition: A | Discharge status: Home / Self Care | Payer: Medicare Other | Attending: Emergency Medicine | Admitting: Emergency Medicine

## 2021-01-19 DIAGNOSIS — M25511 Pain in right shoulder: Secondary | ICD-10-CM | POA: Insufficient documentation

## 2021-01-19 DIAGNOSIS — W19XXXA Unspecified fall, initial encounter: Secondary | ICD-10-CM | POA: Diagnosis not present

## 2021-01-19 DIAGNOSIS — R519 Headache, unspecified: Secondary | ICD-10-CM | POA: Insufficient documentation

## 2021-01-19 DIAGNOSIS — Z5321 Procedure and treatment not carried out due to patient leaving prior to being seen by health care provider: Secondary | ICD-10-CM | POA: Diagnosis not present

## 2021-01-19 NOTE — ED Triage Notes (Signed)
Pt fell 3 weeks ago. Reports ongoing R shoulder pain and headache. No blood thinners.

## 2021-01-20 ENCOUNTER — Ambulatory Visit: Payer: Medicare Other | Admitting: Cardiology

## 2021-01-20 ENCOUNTER — Encounter: Payer: Self-pay | Admitting: Cardiology

## 2021-01-20 VITALS — BP 118/66 | HR 77 | Temp 98.4°F | Resp 16 | Ht 65.0 in | Wt 122.8 lb

## 2021-01-20 DIAGNOSIS — I10 Essential (primary) hypertension: Secondary | ICD-10-CM | POA: Diagnosis not present

## 2021-01-20 DIAGNOSIS — I6522 Occlusion and stenosis of left carotid artery: Secondary | ICD-10-CM

## 2021-01-20 DIAGNOSIS — Z789 Other specified health status: Secondary | ICD-10-CM

## 2021-01-20 DIAGNOSIS — I701 Atherosclerosis of renal artery: Secondary | ICD-10-CM | POA: Diagnosis not present

## 2021-01-20 DIAGNOSIS — E7800 Pure hypercholesterolemia, unspecified: Secondary | ICD-10-CM

## 2021-01-20 DIAGNOSIS — I2510 Atherosclerotic heart disease of native coronary artery without angina pectoris: Secondary | ICD-10-CM | POA: Diagnosis not present

## 2021-01-20 DIAGNOSIS — G44319 Acute post-traumatic headache, not intractable: Secondary | ICD-10-CM | POA: Diagnosis not present

## 2021-01-20 MED ORDER — NEBIVOLOL HCL 10 MG PO TABS: 10.0000 mg | ORAL_TABLET | Freq: Every day | ORAL | 2 refills | Status: DC

## 2021-01-20 NOTE — Progress Notes (Signed)
Primary Physician/Referring:  Vernie Shanks, MD  Patient ID: Kendra [name], female    DOB: 08/02/1935, 85 y.o.   MRN: [id number]  Chief Complaint  Patient presents with   Resistant Hypertension   Follow-up    2 months    HPI:    Kendra [name]  is a 85 y.o. Caucasian female patient with Coronary disease with history of LAD stents performed in 2008 at [facility] clinic, asymptomatic left carotid artery stenosis,  hypertension, hyperlipidemia, history of reactive airway disease with bronchial asthma, difficulty to manage hypertension due to multiple medication intolerances presents here for 6-week office visit.  On her last office visit I had added isosorbide dinitrate and also hydralazine, blood pressure today is perfectly well controlled in spite of patient's complain about headache, multiple somatic complaints about dry mouth, dizziness, fogginess and in spite of reducing the dose to 1/2 tablet 3 times daily.  She prefers that I change the medication again.  States that she had an accidental fall about 2 weeks ago at home and has been having headache and states that she needs a CT scan, went to the emergency room yesterday but after waiting for 4 hours, she did not get a CT scan of the head and is wondering whether I could order a CT scan.  Denies any chest pain, no PND or orthopnea.    Past Medical History:  Diagnosis Date   Asthma    CAD (coronary artery disease)    last cath 08/2006   Carotid artery disease (HCC)    moderate left ICA stenosis by 2.6 ultrasound   DJD (degenerative joint disease), lumbar    Hearing loss    HTN (hypertension)    Hyperlipidemia    Vision loss of right eye    Past Surgical History:  Procedure Laterality Date   ABDOMINAL HYSTERECTOMY  1975   APPENDECTOMY     CORONARY ANGIOPLASTY WITH STENT PLACEMENT  08/2006   taxus stent x2 to LAD, 2.5x5m and [facility]  [place]  Problem Relation Age of Onset   Heart attack Mother        x3; age 83 Cancer Mother        colon   Heart attack Father    Diabetes Father    Cancer Maternal Grandmother        breast   Breast cancer Maternal Grandmother    Heart attack Paternal Grandfather    Hyperlipidemia Sister     Social History   Tobacco Use   Smoking status: Former    Types: Cigarettes    Quit date: 06/08/1966    Years since quitting: 54.6   Smokeless tobacco: Never  Substance Use Topics   Alcohol use: Not Currently    Alcohol/week: 1.0 standard drink    Types: 1 Standard drinks or equivalent per week    Comment: wine   Marital Status: Married  ROS  Review of Systems  Cardiovascular:  Positive for leg swelling (occasional). Negative for chest pain and dyspnea on exertion.  Musculoskeletal:  Positive for arthritis.  Gastrointestinal:  Positive for constipation. Negative for melena.  Objective  Blood pressure 118/66, pulse 77, temperature 98.4 F (36.9 C), temperature source Temporal, resp. rate 16, height 5' 5"  (1.651 m), weight 122 lb 12.8 oz (55.7 kg), SpO2 96 %. Body mass index is 20.43 kg/m.  Vitals  with BMI 01/20/2021 01/19/2021 01/19/2021  Height 5' 5"  - -  Weight 122 lbs 13 oz - -  BMI 42.59 - -  Systolic 563 875 643  Diastolic 66 66 90  Pulse 77 47 68     Physical Exam  Constitutional: No distress.  Neck: No JVD present.  Cardiovascular: Regular rhythm, intact distal pulses and normal pulses. Exam reveals no gallop.  Murmur heard. Systolic murmur is present with a grade of 2/6. Pulses:      Carotid pulses are  on the right side with bruit and  on the left side with bruit. Epigastric bruit present.  Pulmonary/Chest: Effort normal and breath sounds normal. She exhibits no tenderness.  Abdominal: Soft. Bowel sounds are normal.  Musculoskeletal:        General: No edema. Normal range of motion.     Cervical back: Neck supple.   Neurological: She is alert and oriented to person, place, and time.  Skin: Skin is warm.    Laboratory examination:   No results for input(s): NA, K, CL, CO2, GLUCOSE, BUN, CREATININE, CALCIUM, GFRNONAA, GFRAA in the last 8760 hours. CrCl cannot be calculated (Patient's most recent lab result is older than the maximum 21 days allowed.).  CMP Latest Ref Rng & Units 11/04/2013  Glucose 70 - 99 mg/dL 97  BUN 6 - 23 mg/dL 15  Creatinine 0.50 - 1.10 mg/dL 0.62  Sodium 137 - 147 mEq/L 139  Potassium 3.7 - 5.3 mEq/L 5.1  Chloride 96 - 112 mEq/L 100  CO2 19 - 32 mEq/L 26  Calcium 8.4 - 10.5 mg/dL 9.7   CBC Latest Ref Rng & Units 11/04/2013  WBC 4.0 - 10.5 K/uL 8.5  Hemoglobin 12.0 - 15.0 g/dL 13.6  Hematocrit 36.0 - 46.0 % 39.5  Platelets 150 - 400 K/uL 233    Lipid Panel No results for input(s): CHOL, TRIG, LDLCALC, VLDL, HDL, CHOLHDL, LDLDIRECT in the last 8760 hours. Lipid Panel  No results found for: CHOL, TRIG, HDL, CHOLHDL, VLDL, LDLCALC, LDLDIRECT, LABVLDL   HEMOGLOBIN A1C No results found for: HGBA1C, MPG TSH No results for input(s): TSH in the last 8760 hours.  External labs:   Labs 05/10/2020:  Serum glucose 86 mg, BUN 14, creatinine 0.60, EGFR 96 mL, potassium 5.1, CMP otherwise normal.  Total cholesterol 191, triglycerides 68, HDL 80, LDL 98.  Non-HDL cholesterol 111.  Hb 14.2/HCT 42.3, platelets 222.  Medications and allergies   Allergies  Allergen Reactions   Chlorthalidone Other (See Comments)    Hyponatremia   Other     Other reaction(s): Respiratory Distress   Amlodipine     "shivers"    Ciprofloxacin    Dog Epithelium Allergy Skin Test    Latex Other (See Comments)    Irritates her skin   Statins     Unable to tolerate statins w the exception of crestor.   Sulfa Antibiotics Other (See Comments)    "didnt feel good"   Sulfamethoxazole Other (See Comments)    Makes her feel bad   Zetia [Ezetimibe] Other (See Comments)    "makes me constipated"    Hydralazine Other (See Comments)    Headache, stopped working   Penicillins Rash     Current Outpatient Medications on File Prior to Visit  Medication Sig Dispense Refill   albuterol (VENTOLIN HFA) 108 (90 Base) MCG/ACT inhaler Inhale 2 puffs into the lungs every 12 (twelve) hours.     Ascorbic Acid (VITAMIN C) 1000 MG tablet Take 1,000 mg  by mouth daily.     aspirin EC 81 MG tablet Take 81 mg by mouth daily. Swallow whole.     beclomethasone (QVAR) 80 MCG/ACT inhaler Inhale 1-2 puffs into the lungs 2 (two) times daily.     BIOTIN 5000 PO Take 1 tablet by mouth daily.     BLACK ELDERBERRY PO Take 575 mg by mouth daily.     calcium carbonate (OS-CAL - DOSED IN MG OF ELEMENTAL CALCIUM) 1250 (500 Ca) MG tablet Take 1 tablet by mouth daily.     cholecalciferol (VITAMIN D) 1000 UNITS tablet Take 1,000 Units by mouth daily.     clobetasol (OLUX) 0.05 % topical foam Apply 1 application topically every 3 (three) days.     cloNIDine (CATAPRES) 0.1 MG tablet Take 0.1 mg by mouth daily as needed (high BP > 170). Taking 1/2 to 1 tablet daily as needed for SBP > 170.      Coenzyme Q10 (CO Q-10) 100 MG CAPS Take 100 mg by mouth daily.      docusate sodium (COLACE) 100 MG capsule Take 100 mg by mouth daily as needed.     fluticasone (FLONASE) 50 MCG/ACT nasal spray Place 1 spray into both nostrils daily as needed.     Homeopathic Products (ARNICA EX) Apply topically as needed. Sore areas     ketoconazole (NIZORAL) 2 % cream Apply 1 application topically daily. Apply to toe     losartan (COZAAR) 50 MG tablet Take 1 tablet (50 mg total) by mouth in the morning and at bedtime. 180 tablet 3   Multiple Vitamin (MULTIVITAMIN) capsule Take 1 capsule by mouth daily.     rosuvastatin (CRESTOR) 10 MG tablet Take 10 mg by mouth every other day.     triamcinolone (KENALOG) 0.1 % Apply 1 application topically 2 (two) times daily as needed.     Wheat Dextrin (BENEFIBER DRINK MIX PO) Take 1 Dose by mouth daily as  needed.     No current facility-administered medications on file prior to visit.    Medications after today's visit: Current Outpatient Medications  Medication Instructions   albuterol (VENTOLIN HFA) 108 (90 Base) MCG/ACT inhaler 2 puffs, Inhalation, Every 12 hours   aspirin EC 81 mg, Oral, Daily, Swallow whole.    beclomethasone (QVAR) 80 MCG/ACT inhaler 1-2 puffs, Inhalation, 2 times daily   BIOTIN 5000 PO 1 tablet, Oral, Daily   BLACK ELDERBERRY PO 575 mg, Oral, Daily   calcium carbonate (OS-CAL - DOSED IN MG OF ELEMENTAL CALCIUM) 1250 (500 Ca) MG tablet 1 tablet, Oral, Daily   cholecalciferol (VITAMIN D) 1,000 Units, Daily   clobetasol (OLUX) 0.05 % topical foam 1 application, every 72 hours   cloNIDine (CATAPRES) 0.1 mg, Oral, Daily PRN, Taking 1/2 to 1 tablet daily as needed for SBP > 170.    Co Q-10 100 mg, Daily   docusate sodium (COLACE) 100 mg, Oral, Daily PRN   fluticasone (FLONASE) 50 MCG/ACT nasal spray 1 spray, Each Nare, Daily PRN   Homeopathic Products (ARNICA EX) Apply externally, As needed, Sore areas   ketoconazole (NIZORAL) 2 % cream 1 application, Topical, Daily, Apply to toe   losartan (COZAAR) 50 mg, Oral, 2 times daily   Multiple Vitamin (MULTIVITAMIN) capsule 1 capsule, Daily   nebivolol (BYSTOLIC) 10 mg, Oral, Daily   rosuvastatin (CRESTOR) 10 mg, Oral, Every other day   triamcinolone (KENALOG) 0.1 % 1 application, Topical, 2 times daily PRN   vitamin C 1,000 mg, Oral, Daily  Wheat Dextrin (BENEFIBER DRINK MIX PO) 1 Dose, Oral, Daily PRN      Radiology:   DG Shoulder Right  Result Date: 01/19/2021 CLINICAL DATA:  Fall 3 weeks ago, continued shoulder pain. EXAM: RIGHT SHOULDER - 2+ VIEW COMPARISON:  None. FINDINGS: No fracture line or displaced fracture fragment seen. Advanced degenerative osteoarthritic changes at the glenohumeral joint space, with abutment of the humeral head and glenoid fossa with associated articular surface sclerosis and osseous  spurring. Soft tissues about the RIGHT shoulder are unremarkable. IMPRESSION: 1. No acute findings. No fracture or dislocation. 2. Advanced degenerative osteoarthritic changes at the RIGHT glenohumeral joint space. Electronically Signed   By: Franki Cabot M.D.   On: 01/19/2021 14:57    Cardiac Studies:   Coronary angiogram 2008 at [facility]: PCI to the LAD with implantation of 2 overlapping 2.5 x 16 mm and a 2.5 x 8 mm Taxus express DES.  Echocardiogram 04/10/2020:  1. Left ventricular ejection fraction, by estimation, is 60 to 65%. The left ventricle has normal function. The left ventricle has no regional wall motion abnormalities. Left ventricular diastolic parameters are consistent with Grade I diastolic  dysfunction (impaired relaxation). Elevated left ventricular end-diastolic pressure.  2. Right ventricular systolic function is normal. The right ventricular size is normal. There is normal pulmonary artery systolic pressure.  3. Left atrial size was mildly dilated.  4. The mitral valve is normal in structure. Trivial mitral valve regurgitation. No evidence of mitral stenosis. Moderate mitral annular calcification.  5. The aortic valve is tricuspid. Aortic valve regurgitation is not visualized. No aortic stenosis is present.  6. The inferior vena cava is normal in size with greater than 50% respiratory variability, suggesting right atrial pressure of 3 mmHg.  Lower extremity venous insufficiency study left lower extremity 05/30/2020:  - No evidence of deep vein thrombosis seen in the left lower extremity, from the common femoral through the popliteal vein.  - 2.65 x 1.17 cm fluid collection medial knee area.  - No evidence of superficial venous reflux seen in the left greater  saphenous vein.  - No evidence of superficial venous reflux seen in the left short  saphenous vein.  - Venous reflux is noted in the left common femoral vein.  - Venous reflux is noted in the left femoral  vein.  Lower extremity arterial duplex 05/30/2020: Triphasic waveforms throughout the bilateral lower extremity.  Normal bilateral ABI and normal bilateral toe brachial index.  Carotid artery duplex 07/05/2020: Right Carotid: Velocities in the right ICA are consistent with a 1-39% stenosis.  Left Carotid: Velocities in the left ICA are consistent with a 40-59% stenosis.  Vertebrals:  Bilateral vertebral arteries demonstrate antegrade flow. Subclavians: Normal flow hemodynamics were seen in bilateral subclavian arteries.  Renal artery duplex  10/17/2020: Hemodynamically significant stenosis bilaterally. Left renal artery not well visualized. >50% stenosis bilaterally. Normal intrarenal vascular perfusion is noted in the right kidney. Renal length is within normal limits for right kidney. Abdominal aorta shows atherosclerotic changes and mild ectasia, consider dedicated aortic duplex if clinically indicated.  EKG:     EKG 10/07/2020: Marked sinus bradycardia at rate of 51 bpm, borderline left atrial enlargement, otherwise normal EKG. No significant change from 03/13/2020, sinus bradycardia at rate of 51 bpm.  Assessment     ICD-10-CM   1. Resistant hypertension  I10 nebivolol (BYSTOLIC) 10 MG tablet    2. Coronary artery disease involving native coronary artery of native heart without angina pectoris  I25.10  3. Asymptomatic stenosis of left carotid artery  I65.22     4. Pure hypercholesterolemia  E78.00     5. Medication intolerance  Z78.9        Medications Discontinued During This Encounter  Medication Reason   clindamycin (CLEOCIN) 300 MG capsule Error   GAVILYTE-G 236 g solution Error   pindolol (VISKEN) 5 MG tablet Error   isosorbide dinitrate (ISORDIL) 30 MG tablet Side effect (s)   hydrALAZINE (APRESOLINE) 25 MG tablet Side effect (s)    Meds ordered this encounter  Medications   nebivolol (BYSTOLIC) 10 MG tablet    Sig: Take 1 tablet (10 mg total) by mouth  daily.    Dispense:  30 tablet    Refill:  2    No orders of the defined types were placed in this encounter.  Recommendations:   Rhaelyn Giron is a 85 y.o. Caucasian female patient with Coronary disease with history of LAD stents performed in 2008 at [facility] clinic, asymptomatic left carotid artery stenosis,  hypertension, hyperlipidemia, history of reactive airway disease with bronchial asthma, difficulty to manage hypertension due to multiple medication intolerances presents here for 6-week office visit.  On her last office visit I had added isosorbide dinitrate and also hydralazine, blood pressure today is perfectly well controlled in spite of patient's complain about headache, multiple somatic complaints about dry mouth, dizziness, fogginess and in spite of reducing the dose to 1/2 tablet 3 times daily.  She prefers that I change the medication again, will discontinue both and switch to Bystolic 10 mg daily.  Patient was recently evaluated in the emergency room yesterday for continued headache after a fall about 2 weeks ago, she is concerned about concussion and potential for intracerebral bleed as she continues to have headache.  CT scan was ordered.  She is extremely difficult with regard to medication management, she does have bilateral renal artery stenosis but does not appear to be high-grade although appear to be hemodynamically significant.  She is not on guideline directed medical therapy for hypertension to consider renal arteriogram and in view of absence of worsening renal function, I do not think we should proceed with renal angiography with the risk associated with renal angiography and possible angioplasty.  She needs carotid artery duplex for follow-up of carotid stenosis.  She also needs lipid profile testing and I was going to order labs, patient prefers to get this done with the PCP.  From cardiac standpoint and CAD standpoint she has not had any recurrence of angina  pectoris and there is no clinical evidence of heart failure.  Previously she was tolerating pindolol but she has discontinued this for reasons unknown.  I would like to see her back in 6 weeks for follow-up.   40 minute encounter in review of her records, discussion about medication intolerance and compliance.     Adrian Prows, MD, [facility] 01/20/2021, 2:06 PM Office: [phone]

## 2021-01-21 ENCOUNTER — Other Ambulatory Visit: Payer: Self-pay

## 2021-01-21 ENCOUNTER — Ambulatory Visit (HOSPITAL_BASED_OUTPATIENT_CLINIC_OR_DEPARTMENT_OTHER)
Admission: RE | Admit: 2021-01-21 | Discharge: 2021-01-21 | Disposition: A | Discharge status: Home / Self Care | Payer: Medicare Other | Source: Ambulatory Visit | Attending: Cardiology | Admitting: Cardiology

## 2021-01-21 DIAGNOSIS — G44319 Acute post-traumatic headache, not intractable: Secondary | ICD-10-CM | POA: Insufficient documentation

## 2021-01-21 DIAGNOSIS — R519 Headache, unspecified: Secondary | ICD-10-CM | POA: Diagnosis not present

## 2021-01-21 DIAGNOSIS — S0003XA Contusion of scalp, initial encounter: Secondary | ICD-10-CM | POA: Diagnosis not present

## 2021-01-23 DIAGNOSIS — G459 Transient cerebral ischemic attack, unspecified: Secondary | ICD-10-CM | POA: Diagnosis not present

## 2021-01-23 DIAGNOSIS — E782 Mixed hyperlipidemia: Secondary | ICD-10-CM | POA: Diagnosis not present

## 2021-01-23 DIAGNOSIS — T148XXA Other injury of unspecified body region, initial encounter: Secondary | ICD-10-CM | POA: Diagnosis not present

## 2021-01-23 DIAGNOSIS — R001 Bradycardia, unspecified: Secondary | ICD-10-CM | POA: Diagnosis not present

## 2021-01-23 DIAGNOSIS — I10 Essential (primary) hypertension: Secondary | ICD-10-CM | POA: Diagnosis not present

## 2021-01-23 DIAGNOSIS — M1710 Unilateral primary osteoarthritis, unspecified knee: Secondary | ICD-10-CM | POA: Diagnosis not present

## 2021-01-23 DIAGNOSIS — E875 Hyperkalemia: Secondary | ICD-10-CM | POA: Diagnosis not present

## 2021-01-23 DIAGNOSIS — H25093 Other age-related incipient cataract, bilateral: Secondary | ICD-10-CM | POA: Diagnosis not present

## 2021-01-23 DIAGNOSIS — J4541 Moderate persistent asthma with (acute) exacerbation: Secondary | ICD-10-CM | POA: Diagnosis not present

## 2021-01-23 DIAGNOSIS — J320 Chronic maxillary sinusitis: Secondary | ICD-10-CM | POA: Diagnosis not present

## 2021-01-23 DIAGNOSIS — M1990 Unspecified osteoarthritis, unspecified site: Secondary | ICD-10-CM | POA: Diagnosis not present

## 2021-01-23 DIAGNOSIS — I2510 Atherosclerotic heart disease of native coronary artery without angina pectoris: Secondary | ICD-10-CM | POA: Diagnosis not present

## 2021-01-23 DIAGNOSIS — M542 Cervicalgia: Secondary | ICD-10-CM | POA: Diagnosis not present

## 2021-01-23 DIAGNOSIS — H35033 Hypertensive retinopathy, bilateral: Secondary | ICD-10-CM | POA: Diagnosis not present

## 2021-01-23 DIAGNOSIS — I701 Atherosclerosis of renal artery: Secondary | ICD-10-CM | POA: Diagnosis not present

## 2021-01-23 NOTE — Progress Notes (Signed)
Called patient, NA, LMAM

## 2021-01-24 ENCOUNTER — Other Ambulatory Visit: Payer: Self-pay | Admitting: Student

## 2021-01-24 DIAGNOSIS — I10 Essential (primary) hypertension: Secondary | ICD-10-CM

## 2021-01-24 MED ORDER — NEBIVOLOL HCL 10 MG PO TABS: 5.0000 mg | ORAL_TABLET | Freq: Every day | ORAL | 2 refills | Status: DC

## 2021-01-24 NOTE — Progress Notes (Signed)
Called and spoke with patient regarding her CT results.   [place] or Kendra [name] : Patient stated that the Nebivolol has made her heart drop really low and makes her feel lightheaded. She has spoken with her PCP and was advised her try taking it in the morning instead of at night and she will try that this weekend and was also told to ask [place] if maybe adjusting this medication or her other BP medications. Please advise.

## 2021-01-24 NOTE — Progress Notes (Signed)
Called patient, Kendra [name], Kendra [name]

## 2021-01-24 NOTE — Progress Notes (Signed)
Patient may try taking in the morning, if this does not improve lightheadedness she may cut it back to 5 mg.

## 2021-01-27 ENCOUNTER — Telehealth: Payer: Self-pay

## 2021-01-27 NOTE — Telephone Encounter (Signed)
Pt called and stated that she feels her medications are not being handled properly. She stated that the Bystolic 10 mg is not working. She said that she thought it was suppose to be a 24 hour medication. Pt said she wakes up in the middle of the night with her systolic BP around 99991111. Her systolic BP has not been under 130. She thinks the clonidine 0.1 mg is working well. She is not sure why she needs to be on a beta blocker when there are so many other combinations that she can try. Pt has also been taking a whole tablet of the Bystolic and not 0.5 because there is no score indicating that it can be cut in half. She stated that the Bystolic also seems to make her voice crackle and she has been having some vision changes. Please advise.

## 2021-01-27 NOTE — Telephone Encounter (Signed)
Tell her to stop Bystolic and she can start taking Clinidine 0.1 mg twice daily and take additional 1/2 if SBP > 150 mm Hg. If she does well, we can increase clonidine to 0.2 mg twice daily

## 2021-01-27 NOTE — Progress Notes (Signed)
Called and spoke with patient, she stated that she is currently taking the Nebivolol in the morning and her HR is still in the low 40's even after walking a 1-1/2 miles. She would like a call from either [place] or Dr. Einar Gip only, because she has some questions about alternate medication.

## 2021-01-28 ENCOUNTER — Telehealth: Payer: Self-pay | Admitting: Cardiology

## 2021-01-28 DIAGNOSIS — T50905D Adverse effect of unspecified drugs, medicaments and biological substances, subsequent encounter: Secondary | ICD-10-CM

## 2021-01-28 DIAGNOSIS — I10 Essential (primary) hypertension: Secondary | ICD-10-CM | POA: Diagnosis not present

## 2021-01-28 NOTE — Telephone Encounter (Signed)
Called pt, pt seemed concerned about a few other medications. Transferred to Dr. Einar Gip.

## 2021-01-28 NOTE — Telephone Encounter (Signed)
Patient has multiple medication intolerances, patient states that Bystolic does not make her feel good.  Advised her that she should continue losartan for now, as she has been tolerating clonidine but it does cause her dry mouth, she was taking it on a as needed basis, advised her to use 0.1 mg twice daily and continue to use 1/2 tablet twice daily as needed for systolic blood pressure 99991111 mmHg.  Could also potentially try to go up on the dose of the clonidine to 0.2 mg if necessary.  I discussed with the patient that she has to accept the side effects of the medication and see which medicine has the least side effects and has to stick with that.  She has tried at least 15 or greater than 15 medications for blood pressure with side effects from everyone of them.  I spent 10 minutes with her on the phone and additional 3 minutes for review of her medications.    ICD-10-CM   1. Resistant hypertension  I10       Adrian Prows, MD, [facility] 01/28/2021, 2:26 PM Office: [phone] Fax: [phone] Pager: [phone]

## 2021-01-28 NOTE — Telephone Encounter (Signed)
Attempted to call pt, no answer. Left vm requesting call back.

## 2021-02-03 ENCOUNTER — Encounter: Payer: Self-pay | Admitting: Podiatry

## 2021-02-03 ENCOUNTER — Other Ambulatory Visit: Payer: Self-pay

## 2021-02-03 ENCOUNTER — Ambulatory Visit (INDEPENDENT_AMBULATORY_CARE_PROVIDER_SITE_OTHER): Payer: Medicare Other | Admitting: Podiatry

## 2021-02-03 DIAGNOSIS — M79674 Pain in right toe(s): Secondary | ICD-10-CM | POA: Diagnosis not present

## 2021-02-03 DIAGNOSIS — M79675 Pain in left toe(s): Secondary | ICD-10-CM | POA: Diagnosis not present

## 2021-02-03 DIAGNOSIS — I872 Venous insufficiency (chronic) (peripheral): Secondary | ICD-10-CM

## 2021-02-03 DIAGNOSIS — B351 Tinea unguium: Secondary | ICD-10-CM

## 2021-02-03 NOTE — Progress Notes (Signed)
This patient returns to my office for at risk foot care.  This patient requires this care by a professional since this patient will be at risk due to having venous insufficiency.    This patient is unable to cut nails herself since the patient cannot reach her nails.These nails are painful walking and wearing shoes. She is concerned about having fungal infected nails and desires to discuss future treatment. This patient presents for at risk foot care today.  General Appearance  Alert, conversant and in no acute stress.  Vascular  Dorsalis pedis and posterior tibial  pulses are palpable  bilaterally.  Capillary return is within normal limits  bilaterally. Temperature is within normal limits  bilaterally.  Neurologic  Senn-Weinstein monofilament wire test within normal limits  bilaterally. Muscle power within normal limits bilaterally.  Nails Thick disfigured discolored nails with subungual debris  from hallux to fifth toes bilaterally. No evidence of bacterial infection or drainage bilaterally.  Orthopedic  No limitations of motion  feet .  No crepitus or effusions noted.  No bony pathology or digital deformities noted. Lateral deviation at IPJ  right hallux.  Skin  normotropic skin with no porokeratosis noted bilaterally.  No signs of infections or ulcers noted.     Onychomycosis  Pain in right toes  Pain in left toes  Consent was obtained for treatment procedures.   Mechanical debridement of nails 1-5  bilaterally performed with a nail nipper.  Filed with dremel without incident. Discused her nails with this patient.     Return office visit   10 weeks                  Told patient to return for periodic foot care and evaluation due to potential at risk complications.   Gardiner Barefoot DPM

## 2021-02-06 DIAGNOSIS — J320 Chronic maxillary sinusitis: Secondary | ICD-10-CM | POA: Diagnosis not present

## 2021-02-13 ENCOUNTER — Telehealth: Payer: Self-pay | Admitting: Cardiovascular Disease

## 2021-02-13 NOTE — Telephone Encounter (Signed)
Pt is requesting a provider switch from Dr. Gwenlyn Found to Dr. Oval Linsey.

## 2021-02-17 DIAGNOSIS — H25093 Other age-related incipient cataract, bilateral: Secondary | ICD-10-CM | POA: Diagnosis not present

## 2021-02-17 DIAGNOSIS — G459 Transient cerebral ischemic attack, unspecified: Secondary | ICD-10-CM | POA: Diagnosis not present

## 2021-02-17 DIAGNOSIS — E782 Mixed hyperlipidemia: Secondary | ICD-10-CM | POA: Diagnosis not present

## 2021-02-17 DIAGNOSIS — M1990 Unspecified osteoarthritis, unspecified site: Secondary | ICD-10-CM | POA: Diagnosis not present

## 2021-02-17 DIAGNOSIS — I10 Essential (primary) hypertension: Secondary | ICD-10-CM | POA: Diagnosis not present

## 2021-02-17 DIAGNOSIS — J4541 Moderate persistent asthma with (acute) exacerbation: Secondary | ICD-10-CM | POA: Diagnosis not present

## 2021-02-17 DIAGNOSIS — I701 Atherosclerosis of renal artery: Secondary | ICD-10-CM | POA: Diagnosis not present

## 2021-02-17 DIAGNOSIS — M1710 Unilateral primary osteoarthritis, unspecified knee: Secondary | ICD-10-CM | POA: Diagnosis not present

## 2021-02-17 DIAGNOSIS — H35033 Hypertensive retinopathy, bilateral: Secondary | ICD-10-CM | POA: Diagnosis not present

## 2021-02-17 DIAGNOSIS — I2510 Atherosclerotic heart disease of native coronary artery without angina pectoris: Secondary | ICD-10-CM | POA: Diagnosis not present

## 2021-02-20 NOTE — Telephone Encounter (Signed)
[place] with Dr Oval Linsey

## 2021-02-25 DIAGNOSIS — H539 Unspecified visual disturbance: Secondary | ICD-10-CM | POA: Diagnosis not present

## 2021-02-25 DIAGNOSIS — R221 Localized swelling, mass and lump, neck: Secondary | ICD-10-CM | POA: Diagnosis not present

## 2021-02-25 DIAGNOSIS — I701 Atherosclerosis of renal artery: Secondary | ICD-10-CM | POA: Diagnosis not present

## 2021-02-25 DIAGNOSIS — E782 Mixed hyperlipidemia: Secondary | ICD-10-CM | POA: Diagnosis not present

## 2021-02-25 DIAGNOSIS — I2510 Atherosclerotic heart disease of native coronary artery without angina pectoris: Secondary | ICD-10-CM | POA: Diagnosis not present

## 2021-02-25 DIAGNOSIS — R001 Bradycardia, unspecified: Secondary | ICD-10-CM | POA: Diagnosis not present

## 2021-02-25 DIAGNOSIS — G459 Transient cerebral ischemic attack, unspecified: Secondary | ICD-10-CM | POA: Diagnosis not present

## 2021-02-25 DIAGNOSIS — I10 Essential (primary) hypertension: Secondary | ICD-10-CM | POA: Diagnosis not present

## 2021-02-26 ENCOUNTER — Ambulatory Visit
Admission: RE | Admit: 2021-02-26 | Discharge: 2021-02-26 | Disposition: A | Discharge status: Home / Self Care | Payer: Medicare Other | Source: Ambulatory Visit | Attending: Family Medicine | Admitting: Family Medicine

## 2021-02-26 ENCOUNTER — Other Ambulatory Visit: Payer: Self-pay

## 2021-02-26 DIAGNOSIS — Z1231 Encounter for screening mammogram for malignant neoplasm of breast: Secondary | ICD-10-CM

## 2021-03-04 ENCOUNTER — Encounter: Payer: Self-pay | Admitting: Cardiology

## 2021-03-04 ENCOUNTER — Ambulatory Visit: Payer: Medicare Other | Admitting: Cardiology

## 2021-03-04 ENCOUNTER — Other Ambulatory Visit: Payer: Self-pay

## 2021-03-04 VITALS — BP 158/70 | HR 51 | Temp 97.7°F | Resp 18 | Ht 65.0 in | Wt 124.0 lb

## 2021-03-04 DIAGNOSIS — I10 Essential (primary) hypertension: Secondary | ICD-10-CM | POA: Diagnosis not present

## 2021-03-04 DIAGNOSIS — I6522 Occlusion and stenosis of left carotid artery: Secondary | ICD-10-CM | POA: Diagnosis not present

## 2021-03-04 DIAGNOSIS — Z789 Other specified health status: Secondary | ICD-10-CM

## 2021-03-04 DIAGNOSIS — I701 Atherosclerosis of renal artery: Secondary | ICD-10-CM | POA: Diagnosis not present

## 2021-03-04 MED ORDER — HYDRALAZINE HCL 25 MG PO TABS: 25.0000 mg | ORAL_TABLET | Freq: Three times a day (TID) | ORAL | 3 refills | Status: DC | PRN

## 2021-03-04 MED ORDER — HYDRALAZINE HCL 50 MG PO TABS: 50.0000 mg | ORAL_TABLET | Freq: Three times a day (TID) | ORAL | 3 refills | Status: DC | PRN

## 2021-03-04 NOTE — Progress Notes (Signed)
Primary Physician/Referring:  Vernie Shanks, MD  Patient ID: Kendra [name], female    DOB: Apr 04, 1936, 85 y.o.   MRN: [id number]  Chief Complaint  Patient presents with   Resistant Hypertention   Follow-up    6 weeks   carotid stenosis    HPI:    Kendra [name]  is a 85 y.o. Caucasian female patient with Coronary disease with history of LAD stents performed in 2008 at [facility] clinic, asymptomatic left carotid artery stenosis,  hypertension, hyperlipidemia, history of reactive airway disease with bronchial asthma, difficulty to manage hypertension due to multiple medication intolerances presents here for 6-week office visit.  She is again concerned about elevated blood pressure and issues with taking medications.  She is also having some mild headache especially near the left maxillary sinus and also has occasional dizzy spells and is wondering whether maxillary sinusitis could cause her symptoms.  No visual disturbances, no syncope. Denies any chest pain, no PND or orthopnea.    Past Medical History:  Diagnosis Date   Asthma    CAD (coronary artery disease)    last cath 08/2006   Carotid artery disease (HCC)    moderate left ICA stenosis by 2.6 ultrasound   DJD (degenerative joint disease), lumbar    Hearing loss    HTN (hypertension)    Hyperlipidemia    Vision loss of right eye    Past Surgical History:  Procedure Laterality Date   ABDOMINAL HYSTERECTOMY  1975   APPENDECTOMY     CORONARY ANGIOPLASTY WITH STENT PLACEMENT  08/2006   taxus stent x2 to LAD, 2.5x7m [facility]  Problem Relation Age of Onset   Heart attack Mother        x3; age 83 Cancer Mother        colon   Heart attack Father    Diabetes Father    Cancer Maternal Grandmother        breast   Breast cancer Maternal Grandmother    Heart attack Paternal  Grandfather    Hyperlipidemia Sister     Social History   Tobacco Use   Smoking status: Former    Types: Cigarettes    Quit date: 06/08/1966    Years since quitting: 54.7   Smokeless tobacco: Never  Substance Use Topics   Alcohol use: Not Currently    Alcohol/week: 1.0 standard drink    Types: 1 Standard drinks or equivalent per week    Comment: wine   Marital Status: Married  ROS  Review of Systems  Cardiovascular:  Negative for chest pain, dyspnea on exertion and leg swelling.  Musculoskeletal:  Positive for arthritis.  Gastrointestinal:  Positive for constipation. Negative for melena.  Neurological:  Positive for headaches.  Objective  Blood pressure (!) 158/70, pulse (!) 51, temperature 97.7 F (36.5 C), temperature source Temporal, resp. rate 18, height 5' 5"  (1.651 m), weight 124 lb (56.2 kg), SpO2 98 %. Body mass index is 20.63 kg/m.  Vitals with BMI 03/04/2021 01/20/2021 01/19/2021  Height 5' 5"  5' 5"  -  Weight 124 lbs 122 lbs 13 oz -  BMI 2041.96022.29  Systolic [id number] 70 66 66  Pulse 51 77 47     Physical Exam  Constitutional: No distress.  Neck: No JVD present.  Cardiovascular: Regular rhythm, intact  distal pulses and normal pulses. Exam reveals no gallop.  Murmur heard. Early systolic murmur is present with a grade of 2/6. Pulses:      Carotid pulses are  on the right side with bruit and  on the left side with bruit. Epigastric bruit present.  Pulmonary/Chest: Effort normal and breath sounds normal. She exhibits no tenderness.  Abdominal: Soft. Bowel sounds are normal.  Musculoskeletal:        General: No edema. Normal range of motion.     Cervical back: Neck supple.  Neurological: She is alert and oriented to person, place, and time.  Skin: Skin is warm.    Laboratory examination:   No results for input(s): NA, K, CL, CO2, GLUCOSE, BUN, CREATININE, CALCIUM, GFRNONAA, GFRAA in the last 8760 hours. CrCl cannot be calculated (Patient's most  recent lab result is older than the maximum 21 days allowed.).  CMP Latest Ref Rng & Units 11/04/2013  Glucose 70 - 99 mg/dL 97  BUN 6 - 23 mg/dL 15  Creatinine 0.50 - 1.10 mg/dL 0.62  Sodium 137 - 147 mEq/L 139  Potassium 3.7 - 5.3 mEq/L 5.1  Chloride 96 - 112 mEq/L 100  CO2 19 - 32 mEq/L 26  Calcium 8.4 - 10.5 mg/dL 9.7   CBC Latest Ref Rng & Units 11/04/2013  WBC 4.0 - 10.5 K/uL 8.5  Hemoglobin 12.0 - 15.0 g/dL 13.6  Hematocrit 36.0 - 46.0 % 39.5  Platelets 150 - 400 K/uL 233    Lipid Panel No results for input(s): CHOL, TRIG, LDLCALC, VLDL, HDL, CHOLHDL, LDLDIRECT in the last 8760 hours. Lipid Panel  No results found for: CHOL, TRIG, HDL, CHOLHDL, VLDL, LDLCALC, LDLDIRECT, LABVLDL   HEMOGLOBIN A1C No results found for: HGBA1C, MPG TSH No results for input(s): TSH in the last 8760 hours.  External labs:   Labs 05/10/2020:  Serum glucose 86 mg, BUN 14, creatinine 0.60, EGFR 96 mL, potassium 5.1, CMP otherwise normal.  Total cholesterol 191, triglycerides 68, HDL 80, LDL 98.  Non-HDL cholesterol 111.  Hb 14.2/HCT 42.3, platelets 222.  Medications and allergies   Allergies  Allergen Reactions   Chlorthalidone Other (See Comments)    Hyponatremia   Other     Other reaction(s): Respiratory Distress   Amlodipine     "shivers"    Ciprofloxacin    Dog Epithelium Allergy Skin Test    Latex Other (See Comments)    Irritates her skin   Statins     Unable to tolerate statins w the exception of crestor.   Sulfa Antibiotics Other (See Comments)    "didnt feel good"   Sulfamethoxazole Other (See Comments)    Makes her feel bad   Zetia [Ezetimibe] Other (See Comments)    "makes me constipated"   Penicillins Rash     Current Outpatient Medications on File Prior to Visit  Medication Sig Dispense Refill   albuterol (VENTOLIN HFA) 108 (90 Base) MCG/ACT inhaler Inhale 2 puffs into the lungs every 12 (twelve) hours.     Ascorbic Acid (VITAMIN C) 1000 MG tablet Take 1,000  mg by mouth daily.     aspirin EC 81 MG tablet Take 81 mg by mouth daily. Swallow whole.     beclomethasone (QVAR) 80 MCG/ACT inhaler Inhale 1-2 puffs into the lungs 2 (two) times daily.     BIOTIN 5000 PO Take 1 tablet by mouth daily.     BLACK ELDERBERRY PO Take 575 mg by mouth daily.     calcium  carbonate (OS-CAL - DOSED IN MG OF ELEMENTAL CALCIUM) 1250 (500 Ca) MG tablet Take 1 tablet by mouth daily.     cholecalciferol (VITAMIN D) 1000 UNITS tablet Take 1,000 Units by mouth daily.     clobetasol (OLUX) 0.05 % topical foam Apply 1 application topically every 3 (three) days.     cloNIDine (CATAPRES) 0.1 MG tablet Take 0.1 mg by mouth in the morning and at bedtime. Taking 1/2 to 1 tablet daily as needed for SBP > 170.      Coenzyme Q10 (CO Q-10) 100 MG CAPS Take 100 mg by mouth daily.      docusate sodium (COLACE) 100 MG capsule Take 100 mg by mouth daily as needed.     fluticasone (FLONASE) 50 MCG/ACT nasal spray Place 1 spray into both nostrils daily as needed.     Homeopathic Products (ARNICA EX) Apply topically as needed. Sore areas     ketoconazole (NIZORAL) 2 % cream Apply 1 application topically daily. Apply to toe     Multiple Vitamin (MULTIVITAMIN) capsule Take 1 capsule by mouth daily.     rosuvastatin (CRESTOR) 10 MG tablet Take 10 mg by mouth every other day.     triamcinolone (KENALOG) 0.1 % Apply 1 application topically 2 (two) times daily as needed.     Wheat Dextrin (BENEFIBER DRINK MIX PO) Take 1 Dose by mouth daily as needed.     losartan (COZAAR) 50 MG tablet Take 1 tablet (50 mg total) by mouth in the morning and at bedtime. 180 tablet 3   No current facility-administered medications on file prior to visit.    Medications after today's visit: Current Outpatient Medications  Medication Instructions   albuterol (VENTOLIN HFA) 108 (90 Base) MCG/ACT inhaler 2 puffs, Inhalation, Every 12 hours   aspirin EC 81 mg, Oral, Daily, Swallow whole.    beclomethasone (QVAR) 80  MCG/ACT inhaler 1-2 puffs, Inhalation, 2 times daily   BIOTIN 5000 PO 1 tablet, Oral, Daily   BLACK ELDERBERRY PO 575 mg, Oral, Daily   calcium carbonate (OS-CAL - DOSED IN MG OF ELEMENTAL CALCIUM) 1250 (500 Ca) MG tablet 1 tablet, Oral, Daily   cholecalciferol (VITAMIN D) 1,000 Units, Daily   clobetasol (OLUX) 0.05 % topical foam 1 application, every 72 hours   cloNIDine (CATAPRES) 0.1 mg, Oral, 2 times daily, Taking 1/2 to 1 tablet daily as needed for SBP > 170.    Co Q-10 100 mg, Daily   docusate sodium (COLACE) 100 mg, Oral, Daily PRN   fluticasone (FLONASE) 50 MCG/ACT nasal spray 1 spray, Each Nare, Daily PRN   Homeopathic Products (ARNICA EX) Apply externally, As needed, Sore areas   hydrALAZINE (APRESOLINE) 25 mg, Oral, 3 times daily PRN, Take for SBP >140 mm Hg   ketoconazole (NIZORAL) 2 % cream 1 application, Topical, Daily, Apply to toe   losartan (COZAAR) 50 mg, Oral, 2 times daily   Multiple Vitamin (MULTIVITAMIN) capsule 1 capsule, Daily   rosuvastatin (CRESTOR) 10 mg, Oral, Every other day   triamcinolone (KENALOG) 0.1 % 1 application, Topical, 2 times daily PRN   vitamin C 1,000 mg, Oral, Daily   Wheat Dextrin (BENEFIBER DRINK MIX PO) 1 Dose, Oral, Daily PRN      Radiology:   No results found.  Cardiac Studies:   Coronary angiogram 2008 at [facility]: PCI to the LAD with implantation of 2 overlapping 2.5 x 16 mm and a 2.5 x 8 mm Taxus express DES.  Echocardiogram 04/10/2020:  1.  Left ventricular ejection fraction, by estimation, is 60 to 65%. The left ventricle has normal function. The left ventricle has no regional wall motion abnormalities. Left ventricular diastolic parameters are consistent with Grade I diastolic  dysfunction (impaired relaxation). Elevated left ventricular end-diastolic pressure.  2. Right ventricular systolic function is normal. The right ventricular size is normal. There is normal pulmonary artery systolic pressure.  3. Left atrial size was  mildly dilated.  4. The mitral valve is normal in structure. Trivial mitral valve regurgitation. No evidence of mitral stenosis. Moderate mitral annular calcification.  5. The aortic valve is tricuspid. Aortic valve regurgitation is not visualized. No aortic stenosis is present.  6. The inferior vena cava is normal in size with greater than 50% respiratory variability, suggesting right atrial pressure of 3 mmHg.  Lower extremity venous insufficiency study left lower extremity 05/30/2020:  - No evidence of deep vein thrombosis seen in the left lower extremity, from the common femoral through the popliteal vein.  - 2.65 x 1.17 cm fluid collection medial knee area.  - No evidence of superficial venous reflux seen in the left greater  saphenous vein.  - No evidence of superficial venous reflux seen in the left short  saphenous vein.  - Venous reflux is noted in the left common femoral vein.  - Venous reflux is noted in the left femoral vein.  Lower extremity arterial duplex 05/30/2020: Triphasic waveforms throughout the bilateral lower extremity.  Normal bilateral ABI and normal bilateral toe brachial index.  Carotid artery duplex 07/05/2020: Right Carotid: Velocities in the right ICA are consistent with a 1-39% stenosis.  Left Carotid: Velocities in the left ICA are consistent with a 40-59% stenosis.  Vertebrals:  Bilateral vertebral arteries demonstrate antegrade flow. Subclavians: Normal flow hemodynamics were seen in bilateral subclavian arteries.  Renal artery duplex  10/17/2020: Hemodynamically significant stenosis bilaterally. Left renal artery not well visualized. >50% stenosis bilaterally. Normal intrarenal vascular perfusion is noted in the right kidney. Renal length is within normal limits for right kidney. Abdominal aorta shows atherosclerotic changes and mild ectasia, consider dedicated aortic duplex if clinically indicated.  EKG:     EKG 10/07/2020: Marked sinus bradycardia  at rate of 51 bpm, borderline left atrial enlargement, otherwise normal EKG. No significant change from 03/13/2020, sinus bradycardia at rate of 51 bpm.  Assessment     ICD-10-CM   1. Resistant hypertension  I10 hydrALAZINE (APRESOLINE) 25 MG tablet    DISCONTINUED: hydrALAZINE (APRESOLINE) 50 MG tablet    2. Medication intolerance  Z78.9     3. Asymptomatic stenosis of left carotid artery  I65.22 PCV CAROTID DUPLEX (BILATERAL)       Medications Discontinued During This Encounter  Medication Reason   nebivolol (BYSTOLIC) 10 MG tablet Error   hydrALAZINE (APRESOLINE) 50 MG tablet Dose change    Meds ordered this encounter  Medications   DISCONTD: hydrALAZINE (APRESOLINE) 50 MG tablet    Sig: Take 1 tablet (50 mg total) by mouth 3 (three) times daily as needed. For SBP >150 mm Hg    Dispense:  270 tablet    Refill:  3   hydrALAZINE (APRESOLINE) 25 MG tablet    Sig: Take 1 tablet (25 mg total) by mouth 3 (three) times daily as needed. Take for SBP >140 mm Hg    Dispense:  270 tablet    Refill:  3    No orders of the defined types were placed in this encounter.  Recommendations:   Kendra [name]  is a 85 y.o. Caucasian female patient with Coronary disease with history of LAD stents performed in 2008 at [facility] clinic, asymptomatic left carotid artery stenosis,  hypertension, hyperlipidemia, history of reactive airway disease with bronchial asthma, difficulty to manage hypertension due to multiple medication intolerances presents here for 6-week office visit.  Again we had extensive discussion, she did not tolerate Bystolic, however she has started taking hydralazine and states that it seems to be working.  Previously hydralazine was listed as allergy due to headache.  I refilled the prescription for hydralazine and advised her not to consistently check her blood pressure.  Discussions regarding OCD.  I reassured her that her hypertension is not at the stroke risk level and she  has had chronically elevated blood pressure and she is relatively protected but certainly we should try to get this to goal to at least <140 mmHg.  As she has been taking hydralazine as and when she needs it, advised her to try taking it 3 times a day and again take it on a as needed basis if blood pressure goes beyond 469 mmHg systolic.  With regard to carotid artery stenosis, she does need surveillance it was moderate disease on the left asymptomatic, orders placed for carotid duplex in 3 months and I will see her back then.  From cardiac standpoint she remained stable without clinical evidence of heart failure.  She has recently had a CT scan of the head for headaches, no stroke but did show left maxillary sinusitis and she has an appointment to see ENT.  Some of the headaches could be coming from her sinusitis that is chronic.    Adrian Prows, MD, [facility] 03/04/2021, 2:44 PM Office: [phone]

## 2021-03-07 DIAGNOSIS — I10 Essential (primary) hypertension: Secondary | ICD-10-CM

## 2021-03-07 NOTE — Telephone Encounter (Signed)
From pt

## 2021-03-08 MED ORDER — CLONIDINE HCL 0.1 MG PO TABS: 0.1000 mg | ORAL_TABLET | Freq: Three times a day (TID) | ORAL | 3 refills | Status: DC

## 2021-03-14 DIAGNOSIS — I6522 Occlusion and stenosis of left carotid artery: Secondary | ICD-10-CM | POA: Diagnosis not present

## 2021-03-14 DIAGNOSIS — R221 Localized swelling, mass and lump, neck: Secondary | ICD-10-CM | POA: Diagnosis not present

## 2021-03-16 DIAGNOSIS — Z23 Encounter for immunization: Secondary | ICD-10-CM | POA: Diagnosis not present

## 2021-03-17 DIAGNOSIS — M1990 Unspecified osteoarthritis, unspecified site: Secondary | ICD-10-CM | POA: Diagnosis not present

## 2021-03-17 DIAGNOSIS — M1710 Unilateral primary osteoarthritis, unspecified knee: Secondary | ICD-10-CM | POA: Diagnosis not present

## 2021-03-17 DIAGNOSIS — I2510 Atherosclerotic heart disease of native coronary artery without angina pectoris: Secondary | ICD-10-CM | POA: Diagnosis not present

## 2021-03-17 DIAGNOSIS — E782 Mixed hyperlipidemia: Secondary | ICD-10-CM | POA: Diagnosis not present

## 2021-03-17 DIAGNOSIS — G459 Transient cerebral ischemic attack, unspecified: Secondary | ICD-10-CM | POA: Diagnosis not present

## 2021-03-17 DIAGNOSIS — J4540 Moderate persistent asthma, uncomplicated: Secondary | ICD-10-CM | POA: Diagnosis not present

## 2021-03-17 DIAGNOSIS — I701 Atherosclerosis of renal artery: Secondary | ICD-10-CM | POA: Diagnosis not present

## 2021-03-17 DIAGNOSIS — I10 Essential (primary) hypertension: Secondary | ICD-10-CM | POA: Diagnosis not present

## 2021-03-17 DIAGNOSIS — H25093 Other age-related incipient cataract, bilateral: Secondary | ICD-10-CM | POA: Diagnosis not present

## 2021-03-17 DIAGNOSIS — H35033 Hypertensive retinopathy, bilateral: Secondary | ICD-10-CM | POA: Diagnosis not present

## 2021-03-18 DIAGNOSIS — M79672 Pain in left foot: Secondary | ICD-10-CM | POA: Diagnosis not present

## 2021-03-18 DIAGNOSIS — M79671 Pain in right foot: Secondary | ICD-10-CM | POA: Diagnosis not present

## 2021-03-18 DIAGNOSIS — M1909 Primary osteoarthritis, other specified site: Secondary | ICD-10-CM | POA: Diagnosis not present

## 2021-03-27 DIAGNOSIS — K219 Gastro-esophageal reflux disease without esophagitis: Secondary | ICD-10-CM | POA: Diagnosis not present

## 2021-04-02 DIAGNOSIS — Z23 Encounter for immunization: Secondary | ICD-10-CM | POA: Diagnosis not present

## 2021-04-02 DIAGNOSIS — Z1389 Encounter for screening for other disorder: Secondary | ICD-10-CM | POA: Diagnosis not present

## 2021-04-02 DIAGNOSIS — Z0000 Encounter for general adult medical examination without abnormal findings: Secondary | ICD-10-CM | POA: Diagnosis not present

## 2021-04-03 ENCOUNTER — Ambulatory Visit (INDEPENDENT_AMBULATORY_CARE_PROVIDER_SITE_OTHER): Payer: Medicare Other | Admitting: Cardiovascular Disease

## 2021-04-03 ENCOUNTER — Encounter (HOSPITAL_BASED_OUTPATIENT_CLINIC_OR_DEPARTMENT_OTHER): Payer: Self-pay | Admitting: Cardiovascular Disease

## 2021-04-03 ENCOUNTER — Other Ambulatory Visit: Payer: Self-pay

## 2021-04-03 VITALS — BP 212/76 | HR 54 | Ht 65.0 in | Wt 123.3 lb

## 2021-04-03 DIAGNOSIS — I701 Atherosclerosis of renal artery: Secondary | ICD-10-CM

## 2021-04-03 DIAGNOSIS — I2510 Atherosclerotic heart disease of native coronary artery without angina pectoris: Secondary | ICD-10-CM

## 2021-04-03 DIAGNOSIS — Z5181 Encounter for therapeutic drug level monitoring: Secondary | ICD-10-CM | POA: Diagnosis not present

## 2021-04-03 DIAGNOSIS — I10 Essential (primary) hypertension: Secondary | ICD-10-CM

## 2021-04-03 MED ORDER — IRBESARTAN 150 MG PO TABS: 150.0000 mg | ORAL_TABLET | Freq: Two times a day (BID) | ORAL | 1 refills | Status: DC

## 2021-04-03 MED ORDER — SPIRONOLACTONE 25 MG PO TABS: 25.0000 mg | ORAL_TABLET | Freq: Every day | ORAL | 3 refills | Status: DC

## 2021-04-03 NOTE — Patient Instructions (Signed)
Medication Instructions:  STOP LOSARTAN   START IRBESARTAN 150 MG TWICE A DAY   STOP HYDRALAZINE   START SPIRONOLACTONE 25 MG DAILY   Labwork: BMET IN 1 WEEK    Testing/Procedures: Non-Cardiac CT Angiography (CTA), is a special type of CT scan that uses a computer to produce multi-dimensional views of major blood vessels throughout the body. In CT angiography, a contrast material is injected through an IV to help visualize the blood vessels OF ABDOMEN    Follow-Up: 05/05/2021 AT 1:30 PM

## 2021-04-03 NOTE — Progress Notes (Addendum)
Cardiology Office Note:    Date:  04/04/2021   ID:  Kendra [name], DOB 08-12-1935, MRN [id number]  PCP:  Vernie Shanks, MD   [facility] Providers Cardiologist:  None 50    Referring MD: Vernie Shanks, MD   No chief complaint on file.   History of Present Illness:    Kendra [name] is a 85 y.o. female with a hx of asthma, CAD s/p LAD PCI, carotid stenosis, DJD, hypertension, and hyperlipidemia, here for discussion of medication management. She last saw Dr. Einar Gip 02/2021 for her blood pressure. She has intolerances to multiple medications. She had renal artery dopplers 10/2020 which revealed greater than 50% stenosis in the bilaterally. The left renal artery was not fully visualized. At that appointment, hydralazine was reduced to 25 mg. She previously did not tolerate this medicine due to headaches. She last saw Dr. Alvester Chou 06/2020 and her blood pressure was 138/70. At that time she was on clonidine and losartan.  Today, she notes that her blood pressure in clinic today (214/76) is very atypical for her at this time of day. She reports initially developing hypertension in her 35's, and medically managed in her late 60's. Until a few years ago it was generally better controlled, but has been gradually increasing. At home her blood pressure is typically 150-160, but has been higher at night. Only took clonidine if her blood pressure was 983 systolic or higher. Currently she is taking hydralazine 3 times daily. If she takes a full dose 3 times a day, she develops side effects of headaches and chills. For exercise she either walks every day or performs Tai-chi at home for about 20-30 minutes. In her diet she is very careful to avoid salt. She is vegetarian, and has a combination of cooking meals at home and ordering out (usually fish). Typically she has a cup of half-caffeinated coffee in the mornings and decaf in the afternoon. For pain management she will take aspirin. Current  supplements include tiger balm, zinc, and elderberries. Of note, she has some issues with intermittent constipation that she believes is exacerbated by her medication. She denies any palpitations, chest pain, or shortness of breath. No lightheadedness, syncope, orthopnea, PND, lower extremity edema or exertional symptoms.  Prior Antihypertensives: Chlorthalidone Amlodipine - chills Bystolic Nifedipine Valsartan Olmesartan  Past Medical History:  Diagnosis Date   Asthma    CAD (coronary artery disease)    last cath 08/2006   Carotid artery disease (HCC)    moderate left ICA stenosis by 2.6 ultrasound   DJD (degenerative joint disease), lumbar    Hearing loss    HTN (hypertension)    Hyperlipidemia    Vision loss of right eye     Past Surgical History:  Procedure Laterality Date   ABDOMINAL HYSTERECTOMY  1975   APPENDECTOMY     CORONARY ANGIOPLASTY WITH STENT PLACEMENT  08/2006   taxus stent x2 to LAD, 2.5x36m [facility]  Current Medications: Current Meds  Medication Sig   albuterol (VENTOLIN HFA) 108 (90 Base) MCG/ACT inhaler Inhale 2 puffs into the lungs every 12 (twelve) hours.   Ascorbic Acid (VITAMIN C) 1000 MG tablet Take 1,000 mg by mouth daily.   aspirin EC 81 MG tablet Take 81 mg by mouth daily. Swallow whole.   beclomethasone (QVAR) 80 MCG/ACT inhaler Inhale 1-2 puffs into the lungs  2 (two) times daily.   BIOTIN 5000 PO Take 1 tablet by mouth daily.   BLACK ELDERBERRY PO Take 575 mg by mouth daily.   calcium carbonate (OS-CAL - DOSED IN MG OF ELEMENTAL CALCIUM) 1250 (500 Ca) MG tablet Take 1 tablet by mouth daily.   cholecalciferol (VITAMIN D) 1000 UNITS tablet Take 1,000 Units by mouth daily.   clobetasol (OLUX) 0.05 % topical foam Apply 1 application topically every 3 (three) days.   Coenzyme Q10 (CO Q-10) 100 MG CAPS Take 100 mg by mouth daily.     docusate sodium (COLACE) 100 MG capsule Take 100 mg by mouth daily as needed.   fluticasone (FLONASE) 50 MCG/ACT nasal spray Place 1 spray into both nostrils daily as needed.   Homeopathic Products (ARNICA EX) Apply topically as needed. Sore areas   irbesartan (AVAPRO) 150 MG tablet Take 1 tablet (150 mg total) by mouth in the morning and at bedtime.   ketoconazole (NIZORAL) 2 % cream Apply 1 application topically daily. Apply to toe   Multiple Vitamin (MULTIVITAMIN) capsule Take 1 capsule by mouth daily.   rosuvastatin (CRESTOR) 10 MG tablet Take 10 mg by mouth every other day.   spironolactone (ALDACTONE) 25 MG tablet Take 1 tablet (25 mg total) by mouth daily.   triamcinolone (KENALOG) 0.1 % Apply 1 application topically 2 (two) times daily as needed.   Wheat Dextrin (BENEFIBER DRINK MIX PO) Take 1 Dose by mouth daily as needed.   [DISCONTINUED] cloNIDine (CATAPRES) 0.1 MG tablet Take 1 tablet (0.1 mg total) by mouth in the morning, at noon, and at bedtime. As directed   [DISCONTINUED] hydrALAZINE (APRESOLINE) 25 MG tablet Take 12.5 mg by mouth 3 (three) times daily. Take for SBP>140 mm hg   [DISCONTINUED] losartan (COZAAR) 50 MG tablet Take 1 tablet (50 mg total) by mouth in the morning and at bedtime.     Allergies:   Chlorthalidone, Other, Amlodipine, Ciprofloxacin, Dog epithelium allergy skin test, Latex, Statins, Sulfa antibiotics, Sulfamethoxazole, Zetia [ezetimibe], and Penicillins   Social History   Socioeconomic History   Marital status: Married    Spouse name: Not on file   Number of children: 1   Years of education: MAx2   Highest education level: Not on file  Occupational History   Occupation: Retired   Occupation: Education officer, museum  Tobacco Use   Smoking status: Former    Types: Cigarettes    Quit date: 06/08/1966    Years since quitting: 54.8   Smokeless tobacco: Never  Vaping Use   Vaping Use: Never used  Substance and Sexual Activity   Alcohol use: Not Currently     Alcohol/week: 1.0 standard drink    Types: 1 Standard drinks or equivalent per week    Comment: wine   Drug use: No   Sexual activity: Not on file  Other Topics Concern   Not on file  Social History Narrative   Not on file   Social Determinants of Health   Financial Resource Strain: Not on file  Food Insecurity: Not on file  Transportation Needs: Not on file  Physical Activity: Sufficiently Active   Days of Exercise per Week: 5 days   Minutes of Exercise per Session: 30 min  Stress: Not on file  Social Connections: Not on file     Family History: The patient's family history includes Breast cancer in her maternal grandmother; Cancer in her maternal grandmother and mother; Diabetes in her father; Heart attack in her mother and paternal  grandfather; Heart failure in her father; Hyperlipidemia in her sister.  ROS:   Please see the history of present illness.    All other systems reviewed and are negative.  EKGs/Labs/Other Studies Reviewed:    The following studies were reviewed today:  PCV Renal Artery Duplex 10/17/2020: Hemodynamically significant stenosis bilaterally. Left renal artery not  well visualized. >50% stenosis bilaterally. Normal intrarenal vascular  perfusion is noted in the right kidney. Renal length is within normal limits for right kidney. Abdominal aorta shows atherosclerotic changes and mild ectasia, consider dedicated aortic duplex if clinically indicated.   Carotid Duplex 07/05/2020: Summary:  Right Carotid: Velocities in the right ICA are consistent with a 1-39%  stenosis.   Left Carotid: Velocities in the left ICA are consistent with a 40-59%  stenosis.   Vertebrals:  Bilateral vertebral arteries demonstrate antegrade flow.  Subclavians: Normal flow hemodynamics were seen in bilateral subclavian arteries.   Echo 04/10/2020:  1. Left ventricular ejection fraction, by estimation, is 60 to 65%. The  left ventricle has normal function. The left  ventricle has no regional  wall motion abnormalities. Left ventricular diastolic parameters are  consistent with Grade I diastolic  dysfunction (impaired relaxation). Elevated left ventricular end-diastolic  pressure.   2. Right ventricular systolic function is normal. The right ventricular  size is normal. There is normal pulmonary artery systolic pressure.   3. Left atrial size was mildly dilated.   4. The mitral valve is normal in structure. Trivial mitral valve  regurgitation. No evidence of mitral stenosis. Moderate mitral annular  calcification.   5. The aortic valve is tricuspid. Aortic valve regurgitation is not  visualized. No aortic stenosis is present.   6. The inferior vena cava is normal in size with greater than 50%  respiratory variability, suggesting right atrial pressure of 3 mmHg.   EKG:    04/03/21: No EKG  Recent Labs: No results found for requested labs within last 8760 hours.  Recent Lipid Panel No results found for: CHOL, TRIG, HDL, CHOLHDL, VLDL, LDLCALC, LDLDIRECT   Physical Exam:    Wt Readings from Last 3 Encounters:  04/03/21 123 lb 4.8 oz (55.9 kg)  03/04/21 124 lb (56.2 kg)  01/20/21 122 lb 12.8 oz (55.7 kg)     VS:  BP (!) 212/76 (BP Location: Right Arm, Patient Position: Sitting, Cuff Size: Normal)   Pulse (!) 54   Ht 5' 5" (1.651 m)   Wt 123 lb 4.8 oz (55.9 kg)   SpO2 99%   BMI 20.52 kg/m  , BMI Body mass index is 20.52 kg/m. GENERAL:  Well appearing HEENT: Pupils equal round and reactive, fundi not visualized, oral mucosa unremarkable NECK:  No jugular venous distention, waveform within normal limits, carotid upstroke brisk and symmetric, no bruits, no thyromegaly LUNGS:  Clear to auscultation bilaterally HEART:  RRR.  PMI not displaced or sustained,S1 and S2 within normal limits, no S3, no S4, no clicks, no rubs, no murmurs ABD:  Flat, positive bowel sounds normal in frequency in pitch, no bruits, no rebound, no guarding, no midline  pulsatile mass, no hepatomegaly, no splenomegaly EXT:  2 plus pulses throughout, no edema, no cyanosis no clubbing SKIN:  No rashes no nodules NEURO:  Cranial nerves II through XII grossly intact, motor grossly intact throughout PSYCH:  Cognitively intact, oriented to person place and time   ASSESSMENT:    1. Renal artery stenosis ([place])   2. Coronary artery disease involving native coronary artery of native heart  without angina pectoris   3. Therapeutic drug monitoring   4. Essential hypertension    PLAN:    Coronary artery disease S/p PCI in 2008.  She has no recurrent angina.  Continue aspirin and rosuvastatin.   Essential hypertension Her BP has been much more elevated in the last several years.  She has multiple medication intolerances.  She had renal artery stenosis on her Doppler of unclear significance.  We will get an abdominal CT-A to assess for this and adrenal adenomas. She will switch losartan to irbesartan 177m bid.  Hydralazine is such a low dose that it isn't helping.  Stop hydralazine and add spironolactone 265m  Check BMP in a week.  Continue clonidine.  She will try to space it out so that is closer to 8 hours apart.   Disposition: FU with Jahleel Stroschein C. RaOval LinseyMD, [facility].   Medication Adjustments/Labs and Tests Ordered: Current medicines are reviewed at length with the patient today.  Concerns regarding medicines are outlined above.   Orders Placed This Encounter  Procedures   CT Angio Abd/Pel w/ and/or w/o   Basic metabolic panel    Meds ordered this encounter  Medications   irbesartan (AVAPRO) 150 MG tablet    Sig: Take 1 tablet (150 mg total) by mouth in the morning and at bedtime.    Dispense:  180 tablet    Refill:  1    D/C LOSARTAN   spironolactone (ALDACTONE) 25 MG tablet    Sig: Take 1 tablet (25 mg total) by mouth daily.    Dispense:  90 tablet    Refill:  3    D/C HYDRALAZINE     Patient Instructions  Medication Instructions:   STOP LOSARTAN   START IRBESARTAN 150 MG TWICE A DAY   STOP HYDRALAZINE   START SPIRONOLACTONE 25 MG DAILY   Labwork: BMET IN 1 WEEK    Testing/Procedures: Non-Cardiac CT Angiography (CTA), is a special type of CT scan that uses a computer to produce multi-dimensional views of major blood vessels throughout the body. In CT angiography, a contrast material is injected through an IV to help visualize the blood vessels OF ABDOMEN    Follow-Up: 05/05/2021 AT 1:30 PM       I,Mathew Stumpf,acting as a scribe for TiSkeet LatchMD.,have documented all relevant documentation on the behalf of TiSkeet LatchMD,as directed by  TiSkeet LatchMD while in the presence of TiSkeet LatchMD.   I, TiSpartaaOval LinseyMD have reviewed all documentation for this visit.  The documentation of the exam, diagnosis, procedures, and orders on 04/04/2021 are all accurate and complete.  Time spent: 50 minutes-Greater than 50% of this time was spent in counseling, explanation of diagnosis, planning of further management, and coordination of care.   Signed, TiSkeet LatchMD  04/04/2021 5:59 PM    [facility]

## 2021-04-03 NOTE — Assessment & Plan Note (Signed)
S/p PCI in 2008.  She has no recurrent angina.  Continue aspirin and rosuvastatin.

## 2021-04-03 NOTE — Assessment & Plan Note (Addendum)
Her BP has been much more elevated in the last several years.  She has multiple medication intolerances.  She had renal artery stenosis on her Doppler of unclear significance.  We will get an abdominal CT-A to assess for this and adrenal adenomas. She will switch losartan to irbesartan 150mg  bid.  Hydralazine is such a low dose that it isn't helping.  Stop hydralazine and add spironolactone 25mg .  Check BMP in a week.  Continue clonidine.  She will try to space it out so that is closer to 8 hours apart.

## 2021-04-04 ENCOUNTER — Telehealth: Payer: Self-pay | Admitting: Cardiovascular Disease

## 2021-04-04 ENCOUNTER — Encounter (HOSPITAL_BASED_OUTPATIENT_CLINIC_OR_DEPARTMENT_OTHER): Payer: Self-pay | Admitting: Cardiovascular Disease

## 2021-04-04 NOTE — Telephone Encounter (Signed)
Discussed with Dr Oval Linsey and she would like patient to continue Clonidine Advised patient, verbalized understanding

## 2021-04-04 NOTE — Telephone Encounter (Signed)
Patient is called wanting to know if she still should be taking cloNIDine (CATAPRES) 0.1 MG tablet.

## 2021-04-04 NOTE — Telephone Encounter (Signed)
Spoke to patient she stated she forgot to ask Dr.[place] yesterday if she wants her to keep taking Clonidine.Stated she takes Clonidine 0.1 mg 1 tablet in morning, 1/2 tablet in afternoon, and 1 tablet at night.Advised I will send message to [place] for advice.

## 2021-04-07 ENCOUNTER — Encounter (HOSPITAL_BASED_OUTPATIENT_CLINIC_OR_DEPARTMENT_OTHER): Payer: Self-pay | Admitting: *Deleted

## 2021-04-07 NOTE — Progress Notes (Signed)
This encounter was created in error - please disregard.

## 2021-04-08 ENCOUNTER — Encounter (HOSPITAL_BASED_OUTPATIENT_CLINIC_OR_DEPARTMENT_OTHER): Payer: Self-pay

## 2021-04-08 ENCOUNTER — Ambulatory Visit (HOSPITAL_BASED_OUTPATIENT_CLINIC_OR_DEPARTMENT_OTHER)
Admission: RE | Admit: 2021-04-08 | Discharge: 2021-04-08 | Disposition: A | Discharge status: Home / Self Care | Payer: Medicare Other | Source: Ambulatory Visit | Attending: Cardiovascular Disease | Admitting: Cardiovascular Disease

## 2021-04-08 ENCOUNTER — Other Ambulatory Visit: Payer: Self-pay

## 2021-04-08 DIAGNOSIS — I701 Atherosclerosis of renal artery: Secondary | ICD-10-CM | POA: Diagnosis not present

## 2021-04-08 DIAGNOSIS — I708 Atherosclerosis of other arteries: Secondary | ICD-10-CM | POA: Diagnosis not present

## 2021-04-08 LAB — POCT I-STAT CREATININE: Creatinine, Ser: 0.6 mg/dL (ref 0.44–1.00)

## 2021-04-08 MED ORDER — IOHEXOL 350 MG/ML SOLN
60.0000 mL | Freq: Once | INTRAVENOUS | Status: AC | PRN
  Administered 2021-04-08: 60 mL via INTRAVENOUS

## 2021-04-10 DIAGNOSIS — Z5181 Encounter for therapeutic drug level monitoring: Secondary | ICD-10-CM | POA: Diagnosis not present

## 2021-04-11 LAB — BASIC METABOLIC PANEL
BUN/Creatinine Ratio: 18 (ref 12–28)
BUN: 13 mg/dL (ref 8–27)
CO2: 26 mmol/L (ref 20–29)
Calcium: 9.5 mg/dL (ref 8.7–10.3)
Chloride: 96 mmol/L (ref 96–106)
Creatinine, Ser: 0.71 mg/dL (ref 0.57–1.00)
Glucose: 115 mg/dL — ABNORMAL HIGH (ref 70–99)
Potassium: 5.6 mmol/L — ABNORMAL HIGH (ref 3.5–5.2)
Sodium: 134 mmol/L (ref 134–144)
eGFR: 84 mL/min/{1.73_m2} (ref >59)

## 2021-04-15 ENCOUNTER — Ambulatory Visit: Payer: Medicare Other | Admitting: Podiatry

## 2021-04-16 ENCOUNTER — Ambulatory Visit (INDEPENDENT_AMBULATORY_CARE_PROVIDER_SITE_OTHER): Payer: Medicare Other | Admitting: Podiatry

## 2021-04-16 ENCOUNTER — Other Ambulatory Visit: Payer: Self-pay

## 2021-04-16 ENCOUNTER — Encounter: Payer: Self-pay | Admitting: Podiatry

## 2021-04-16 DIAGNOSIS — B351 Tinea unguium: Secondary | ICD-10-CM | POA: Diagnosis not present

## 2021-04-16 DIAGNOSIS — L603 Nail dystrophy: Secondary | ICD-10-CM | POA: Diagnosis not present

## 2021-04-16 DIAGNOSIS — L608 Other nail disorders: Secondary | ICD-10-CM | POA: Diagnosis not present

## 2021-04-16 NOTE — Progress Notes (Signed)
  Subjective:  Patient ID: Kendra [name], female    DOB: 11/18/1935,   MRN: [id number]  Chief Complaint  Patient presents with   Nail Problem    Toenail fungus    85 y.o. female presents for concern of toenail fungus. Relates she is mostly concerned with the left foot. States she has tried vicks vapor rub and neosporin. States the neosporin maybe helpful. Has also tried tea tree oil nail polish and topicals with no change. Wanting to try a culture today to figure out what is causing the nails dystrophy . Denies any other pedal complaints. Denies n/v/f/c.   Past Medical History:  Diagnosis Date   Asthma    CAD (coronary artery disease)    last cath 08/2006   Carotid artery disease (HCC)    moderate left ICA stenosis by 2.6 ultrasound   DJD (degenerative joint disease), lumbar    Hearing loss    HTN (hypertension)    Hyperlipidemia    Vision loss of right eye     Objective:  Physical Exam: Vascular: DP/PT pulses 2/4 bilateral. CFT <3 seconds. Normal hair growth on digits. No edema.  Skin. No lacerations or abrasions bilateral feet. Nails 1-5 are thickened discolored and elongated with subungual debris on left. Right appear normal thickness color and length. Musculoskeletal: MMT 5/5 bilateral lower extremities in DF, PF, Inversion and Eversion. Deceased ROM in DF of ankle joint.  Neurological: Sensation intact to light touch.   Assessment:   1. Dermatophytosis of nail      Plan:  Patient was evaluated and treated and all questions answered. -Examined patient -Discussed treatment options for painful dystrophic nails  -Clinical picture and Fungal culture was obtained by removing a portion of the hard nail itself from each of the involved toenails using a sterile nail nipper and sent to [facility] lab. Patient tolerated the biopsy procedure well without discomfort or need for anesthesia.  -Discussed fungal nail treatment options including oral, topical, and laser treatments.   -Patient to return in 4 weeks for follow up evaluation and discussion of fungal culture results or sooner if symptoms worsen.   Lorenda Peck, DPM

## 2021-04-16 NOTE — Addendum Note (Signed)
Addended by: Wyman Songster T on: 04/16/2021 02:45 PM   Modules accepted: Orders

## 2021-04-21 DIAGNOSIS — E782 Mixed hyperlipidemia: Secondary | ICD-10-CM | POA: Diagnosis not present

## 2021-04-21 DIAGNOSIS — H25093 Other age-related incipient cataract, bilateral: Secondary | ICD-10-CM | POA: Diagnosis not present

## 2021-04-21 DIAGNOSIS — I10 Essential (primary) hypertension: Secondary | ICD-10-CM | POA: Diagnosis not present

## 2021-04-21 DIAGNOSIS — I2510 Atherosclerotic heart disease of native coronary artery without angina pectoris: Secondary | ICD-10-CM | POA: Diagnosis not present

## 2021-04-21 DIAGNOSIS — Z8673 Personal history of transient ischemic attack (TIA), and cerebral infarction without residual deficits: Secondary | ICD-10-CM | POA: Diagnosis not present

## 2021-04-21 DIAGNOSIS — H35033 Hypertensive retinopathy, bilateral: Secondary | ICD-10-CM | POA: Diagnosis not present

## 2021-04-21 DIAGNOSIS — R7303 Prediabetes: Secondary | ICD-10-CM | POA: Diagnosis not present

## 2021-04-21 DIAGNOSIS — I701 Atherosclerosis of renal artery: Secondary | ICD-10-CM | POA: Diagnosis not present

## 2021-04-21 DIAGNOSIS — J4540 Moderate persistent asthma, uncomplicated: Secondary | ICD-10-CM | POA: Diagnosis not present

## 2021-04-29 ENCOUNTER — Other Ambulatory Visit: Payer: Self-pay | Admitting: *Deleted

## 2021-04-29 DIAGNOSIS — I701 Atherosclerosis of renal artery: Secondary | ICD-10-CM

## 2021-05-05 ENCOUNTER — Other Ambulatory Visit: Payer: Self-pay

## 2021-05-05 ENCOUNTER — Ambulatory Visit (INDEPENDENT_AMBULATORY_CARE_PROVIDER_SITE_OTHER): Payer: Medicare Other | Admitting: Cardiovascular Disease

## 2021-05-05 ENCOUNTER — Encounter (HOSPITAL_BASED_OUTPATIENT_CLINIC_OR_DEPARTMENT_OTHER): Payer: Self-pay | Admitting: Cardiovascular Disease

## 2021-05-05 VITALS — BP 176/72 | HR 50 | Ht 65.0 in | Wt 122.2 lb

## 2021-05-05 DIAGNOSIS — Z5181 Encounter for therapeutic drug level monitoring: Secondary | ICD-10-CM | POA: Diagnosis not present

## 2021-05-05 DIAGNOSIS — I10 Essential (primary) hypertension: Secondary | ICD-10-CM

## 2021-05-05 DIAGNOSIS — I701 Atherosclerosis of renal artery: Secondary | ICD-10-CM

## 2021-05-05 DIAGNOSIS — E782 Mixed hyperlipidemia: Secondary | ICD-10-CM

## 2021-05-05 HISTORY — DX: Atherosclerosis of renal artery: I70.1

## 2021-05-05 MED ORDER — IRBESARTAN 300 MG PO TABS: 300.0000 mg | ORAL_TABLET | Freq: Every day | ORAL | 1 refills | Status: DC

## 2021-05-05 MED ORDER — INDAPAMIDE 1.25 MG PO TABS: 1.2500 mg | ORAL_TABLET | Freq: Every day | ORAL | 1 refills | Status: DC

## 2021-05-05 NOTE — Progress Notes (Signed)
Cardiology Office Note:    Date:  05/05/2021   ID:  Kendra [name], DOB 1936-01-04, MRN [id number]  PCP:  Vernie Shanks, MD   [facility] Providers Cardiologist:  None 50    Referring MD: Vernie Shanks, MD   No chief complaint on file.   History of Present Illness:    Kendra [name] is a 85 y.o. female with a hx of asthma, CAD s/p LAD PCI, carotid stenosis, renal artery stenosis, DJD, hypertension, and hyperlipidemia, here for follow up.  She was seen 03/2021 for discussion of medication management. She last saw Dr. Einar Gip 02/2021 for her blood pressure. She has intolerances to multiple medications. She had renal artery dopplers 10/2020 which revealed greater than 50% stenosis in the bilaterally. The left renal artery was not fully visualized.  She had a CTA of the abdomen and pelvis 04/2021 which revealed 50% proximal left renal artery stenosis and 2 right renal arteries.  There was 40% proximal stenosis in the larger artery.  She had some mild plaque in the SMA and some stenosis in the IMA.  She also had 50% bilateral common iliac artery stenosis.  At that appointment, hydralazine was reduced to 25 mg. She previously did not tolerate this medicine due to headaches. She last saw Dr. Gwenlyn Found 06/2020 and her blood pressure was 138/70. At that time she was on clonidine and losartan.  When she was [facility] seen [facility] her blood pressure was 214/76.  She continued to report intolerance to multiple medications.  Losartan was switched to irbesartan 150 mg twice daily.  Hydralazine was discontinued and she was started on spironolactone.  She was instructed to start taking the clonidine every 8 hours.  She notices that her BP seems to increase at night.  She has noticed this for 30 years but it seems to be wrose lately.  She takes her evening medicine at around 10 pm.  She wakes up at around 3 and feels funny so she checks her BP and it has been as high as the [id number]  systolic.  BP mch better after exercise.  It has been 120-130s at night.   She notes that she was recently diagnosed with alopecia. She notes an increase in hair loss since increasing clonidine to tid.  She stopped taking spironolactone because it made her urinate less during the day but more at night. There was no improvement in her BP and she felt sluggish.  She denies snoring and her husband has not noted apneic episodes.  She has no daytime somnolence.  She wonders why her BP is elevated at night.    Prior Antihypertensives: Chlorthalidone Amlodipine - chills Bystolic Nifedipine Valsartan Olmesartan spironolactone  Past Medical History:  Diagnosis Date   Asthma    CAD (coronary artery disease)    last cath 08/2006   Carotid artery disease (HCC)    moderate left ICA stenosis by 2.6 ultrasound   DJD (degenerative joint disease), lumbar    Hearing loss    HTN (hypertension)    Hyperlipidemia    Renal artery stenosis ([place]) 05/05/2021   Vision loss of right eye     Past Surgical History:  Procedure Laterality Date   ABDOMINAL HYSTERECTOMY  1975   APPENDECTOMY     CORONARY ANGIOPLASTY WITH STENT PLACEMENT  08/2006   taxus stent x2 to LAD, 2.5x65mm and 2.5x101mm; done at [facility] in [place]  1994    Current Medications: Current Meds  Medication Sig   albuterol (VENTOLIN HFA) 108 (90 Base) MCG/ACT inhaler Inhale 2 puffs into the lungs every 12 (twelve) hours.   Ascorbic Acid (VITAMIN C) 1000 MG tablet Take 1,000 mg by mouth daily.   aspirin EC 81 MG tablet Take 81 mg by mouth daily. Swallow whole.   beclomethasone (QVAR) 80 MCG/ACT inhaler Inhale 1-2 puffs into the lungs 2 (two) times daily.   BIOTIN 5000 PO Take 1 tablet by mouth daily.   BLACK ELDERBERRY PO Take 575 mg by mouth daily.   calcium carbonate (OS-CAL - DOSED IN MG OF ELEMENTAL CALCIUM) 1250 (500 Ca) MG tablet Take 1 tablet by mouth daily.    cholecalciferol (VITAMIN D) 1000 UNITS tablet Take 1,000 Units by mouth daily.   clobetasol (OLUX) 0.05 % topical foam Apply 1 application topically every 3 (three) days.   cloNIDine (CATAPRES) 0.1 MG tablet Take 1 tablet by mouth in the morning, at noon, and at bedtime.   Coenzyme Q10 (CO Q-10) 100 MG CAPS Take 100 mg by mouth daily.    docusate sodium (COLACE) 100 MG capsule Take 100 mg by mouth daily as needed.   fluticasone (FLONASE) 50 MCG/ACT nasal spray Place 1 spray into both nostrils daily as needed.   Homeopathic Products (ARNICA EX) Apply topically as needed. Sore areas   indapamide (LOZOL) 1.25 MG tablet Take 1 tablet (1.25 mg total) by mouth daily.   ketoconazole (NIZORAL) 2 % cream Apply 1 application topically daily. Apply to toe   Multiple Vitamin (MULTIVITAMIN) capsule Take 1 capsule by mouth daily.   rosuvastatin (CRESTOR) 10 MG tablet Take 10 mg by mouth every other day.   triamcinolone (KENALOG) 0.1 % Apply 1 application topically 2 (two) times daily as needed.   Wheat Dextrin (BENEFIBER DRINK MIX PO) Take 1 Dose by mouth daily as needed.   [DISCONTINUED] irbesartan (AVAPRO) 150 MG tablet Take 1 tablet (150 mg total) by mouth in the morning and at bedtime.   [DISCONTINUED] spironolactone (ALDACTONE) 25 MG tablet Take 1 tablet (25 mg total) by mouth daily.     Allergies:   Chlorthalidone, Other, Amlodipine, Ciprofloxacin, Dog epithelium allergy skin test, Latex, Statins, Sulfa antibiotics, Sulfamethoxazole, Zetia [ezetimibe], and Penicillins   Social History   Socioeconomic History   Marital status: Married    Spouse name: Not on file   Number of children: 1   Years of education: MAx2   Highest education level: Not on file  Occupational History   Occupation: Retired   Occupation: Education officer, museum  Tobacco Use   Smoking status: Former    Types: Cigarettes    Quit date: 06/08/1966    Years since quitting: 54.9   Smokeless tobacco: Never  Vaping Use   Vaping Use: Never  used  Substance and Sexual Activity   Alcohol use: Not Currently    Alcohol/week: 1.0 standard drink    Types: 1 Standard drinks or equivalent per week    Comment: wine   Drug use: No   Sexual activity: Not on file  Other Topics Concern   Not on file  Social History Narrative   Not on file   Social Determinants of Health   Financial Resource Strain: Not on file  Food Insecurity: Not on file  Transportation Needs: Not on file  Physical Activity: Sufficiently Active   Days of Exercise per Week: 5 days   Minutes of Exercise per Session: 30 min  Stress: Not on  file  Social Connections: Not on file     Family History: The patient's family history includes Breast cancer in her maternal grandmother; Cancer in her maternal grandmother and mother; Diabetes in her father; Heart attack in her mother and paternal grandfather; Heart failure in her father; Hyperlipidemia in her sister.  ROS:   Please see the history of present illness.    All other systems reviewed and are negative.  EKGs/Labs/Other Studies Reviewed:    The following studies were reviewed today:  CT-A Abd/pelvis 04/08/21: IMPRESSION: 1. No evidence of high-grade renal artery stenosis by CTA. Single left renal artery demonstrates approximately 50% proximal stenosis. There are 2 separate right renal arteries emanating from the abdominal aorta. The larger of these to arteries demonstrates approximately 40% proximal stenosis. The smaller right renal artery demonstrates no stenosis. 2. Mild plaque at the origins of the celiac axis and SMA without evidence of significant stenosis. Some degree of stenosis is felt to be present at the origin of the IMA. 3. Calcified plaque at the origins of bilateral common iliac arteries causing roughly 50% bilateral stenoses.   PCV Renal Artery Duplex 10/17/2020: Hemodynamically significant stenosis bilaterally. Left renal artery not  well visualized. >50% stenosis bilaterally. Normal  intrarenal vascular  perfusion is noted in the right kidney. Renal length is within normal limits for right kidney. Abdominal aorta shows atherosclerotic changes and mild ectasia, consider dedicated aortic duplex if clinically indicated.   Carotid Duplex 07/05/2020: Summary:  Right Carotid: Velocities in the right ICA are consistent with a 1-39%  stenosis.   Left Carotid: Velocities in the left ICA are consistent with a 40-59%  stenosis.   Vertebrals:  Bilateral vertebral arteries demonstrate antegrade flow.  Subclavians: Normal flow hemodynamics were seen in bilateral subclavian arteries.   Echo 04/10/2020:  1. Left ventricular ejection fraction, by estimation, is 60 to 65%. The  left ventricle has normal function. The left ventricle has no regional  wall motion abnormalities. Left ventricular diastolic parameters are  consistent with Grade I diastolic  dysfunction (impaired relaxation). Elevated left ventricular end-diastolic  pressure.   2. Right ventricular systolic function is normal. The right ventricular  size is normal. There is normal pulmonary artery systolic pressure.   3. Left atrial size was mildly dilated.   4. The mitral valve is normal in structure. Trivial mitral valve  regurgitation. No evidence of mitral stenosis. Moderate mitral annular  calcification.   5. The aortic valve is tricuspid. Aortic valve regurgitation is not  visualized. No aortic stenosis is present.   6. The inferior vena cava is normal in size with greater than 50%  respiratory variability, suggesting right atrial pressure of 3 mmHg.   EKG:    04/03/21: No EKG  Recent Labs: 04/10/2021: BUN 13; Creatinine, Ser 0.71; Potassium 5.6; Sodium 134  Recent Lipid Panel No results found for: CHOL, TRIG, HDL, CHOLHDL, VLDL, LDLCALC, LDLDIRECT   Physical Exam:    Wt Readings from Last 3 Encounters:  05/05/21 122 lb 3.2 oz (55.4 kg)  04/03/21 123 lb 4.8 oz (55.9 kg)  03/04/21 124 lb (56.2 kg)      VS:  BP (!) 176/72 (BP Location: Right Arm, Patient Position: Sitting, Cuff Size: Normal)   Pulse (!) 50   Ht 5\' 5"  (1.651 m)   Wt 122 lb 3.2 oz (55.4 kg)   SpO2 99%   BMI 20.34 kg/m  , BMI Body mass index is 20.34 kg/m. GENERAL:  Well appearing HEENT: Pupils equal round and  reactive, fundi not visualized, oral mucosa unremarkable NECK:  No jugular venous distention, waveform within normal limits, carotid upstroke brisk and symmetric, no bruits, no thyromegaly LUNGS:  Clear to auscultation bilaterally HEART:  RRR.  PMI not displaced or sustained,S1 and S2 within normal limits, no S3, no S4, no clicks, no rubs, no murmurs ABD:  Flat, positive bowel sounds normal in frequency in pitch, no bruits, no rebound, no guarding, no midline pulsatile mass, no hepatomegaly, no splenomegaly EXT:  2 plus pulses throughout, no edema, no cyanosis no clubbing SKIN:  No rashes no nodules NEURO:  Cranial nerves II through XII grossly intact, motor grossly intact throughout PSYCH:  Cognitively intact, oriented to person place and time   ASSESSMENT:    1. Therapeutic drug monitoring   2. Essential hypertension   3. Mixed hyperlipidemia   4. Renal artery stenosis (HCC)     PLAN:    Essential hypertension BP remains above goal.  She struggles with intolerance to multiple medications.  Only other option would be doxazosin or minoxidil. Continue clonidine and switch irbesartan to 300mg  qhs given her BP spikes overnight.  Low suspicion for OSA.   Hyperlipidemia Concerned about statins and DM.  She agrees we will table this for now until BP is stable.  Continue rosuvastatin.  LDL goal <70.  Renal artery stenosis (HCC) Moderate.  Continue aspirin and rosuvastatin.  Consider PCSK9 inhibitor later given intolerance, but we will only make once change at a time.    Disposition: FU with Takuma Cifelli C. Oval Linsey, MD, [facility] in 1 month.   Medication Adjustments/Labs and Tests Ordered: Current medicines are  reviewed at length with the patient today.  Concerns regarding medicines are outlined above.   Orders Placed This Encounter  Procedures   Basic metabolic panel     Meds ordered this encounter  Medications   irbesartan (AVAPRO) 300 MG tablet    Sig: Take 1 tablet (300 mg total) by mouth at bedtime.    Dispense:  90 tablet    Refill:  1    D/C PREVIOUS RX   indapamide (LOZOL) 1.25 MG tablet    Sig: Take 1 tablet (1.25 mg total) by mouth daily.    Dispense:  90 tablet    Refill:  1      Patient Instructions  Medication Instructions:  CHANGE YOUR IRBESARTAN TO 300 MG AT BEDTIME ONLY, NONE IN THE MORNING   START INDAPAMIDE 1.25 MG DAILY   Labwork: BMET IN 1 WEEK   Testing/Procedures: NONE   Follow-Up: 06/25/2021 3:10 PM WITH JESSE C NP    Dr. Sarajane Jews for Dermatology     Time spent: 60 minutes-Greater than 50% of this time was spent in counseling, explanation of diagnosis, planning of further management, and coordination of care.   Signed, Skeet Latch, MD  05/05/2021 6:04 PM    [place]

## 2021-05-05 NOTE — Assessment & Plan Note (Signed)
Moderate.  Continue aspirin and rosuvastatin.  Consider PCSK9 inhibitor later given intolerance, but we will only make once change at a time.

## 2021-05-05 NOTE — Assessment & Plan Note (Signed)
BP remains above goal.  She struggles with intolerance to multiple medications.  Only other option would be doxazosin or minoxidil. Continue clonidine and switch irbesartan to 300mg  qhs given her BP spikes overnight.  Low suspicion for OSA.

## 2021-05-05 NOTE — Assessment & Plan Note (Signed)
Concerned about statins and DM.  She agrees we will table this for now until BP is stable.  Continue rosuvastatin.  LDL goal <70.

## 2021-05-05 NOTE — Patient Instructions (Addendum)
Medication Instructions:  CHANGE YOUR IRBESARTAN TO 300 MG AT BEDTIME ONLY, NONE IN THE MORNING   START INDAPAMIDE 1.25 MG DAILY   Labwork: BMET IN 1 WEEK   Testing/Procedures: NONE   Follow-Up: 06/25/2021 3:10 PM WITH JESSE C NP    Dr. Sarajane Jews for Dermatology

## 2021-05-08 ENCOUNTER — Ambulatory Visit (HOSPITAL_BASED_OUTPATIENT_CLINIC_OR_DEPARTMENT_OTHER): Payer: Medicare Other | Admitting: Cardiovascular Disease

## 2021-05-08 DIAGNOSIS — M1711 Unilateral primary osteoarthritis, right knee: Secondary | ICD-10-CM | POA: Diagnosis not present

## 2021-05-13 ENCOUNTER — Telehealth (HOSPITAL_BASED_OUTPATIENT_CLINIC_OR_DEPARTMENT_OTHER): Payer: Self-pay | Admitting: *Deleted

## 2021-05-13 MED ORDER — CLONIDINE HCL 0.1 MG PO TABS: 0.1000 mg | ORAL_TABLET | Freq: Three times a day (TID) | ORAL | 1 refills | Status: DC

## 2021-05-13 NOTE — Telephone Encounter (Signed)
Received message patient needed refill  Spoke with patient and she has been taking Clonidine 0.1 mg four times a day  Needs refill, [employer] needs this manufacturer   Discussed with Erasmo Downer A Pharm D, response below   I think we told her she could take a 4th tablet if needed.   Since it's still below the max dose, she's fine.  Would probably be easier to get her on a patch, but I would adjust the rx to tid + prn systolic > 264  New Rx sent as requested

## 2021-05-14 ENCOUNTER — Encounter: Payer: Self-pay | Admitting: Podiatry

## 2021-05-14 ENCOUNTER — Other Ambulatory Visit: Payer: Self-pay

## 2021-05-14 ENCOUNTER — Ambulatory Visit (INDEPENDENT_AMBULATORY_CARE_PROVIDER_SITE_OTHER): Payer: Medicare Other | Admitting: Podiatry

## 2021-05-14 DIAGNOSIS — L603 Nail dystrophy: Secondary | ICD-10-CM | POA: Diagnosis not present

## 2021-05-14 NOTE — Progress Notes (Signed)
  Subjective:  Patient ID: Kendra [name], female    DOB: 1936/05/09,   MRN: [id number]  Chief Complaint  Patient presents with   Follow-up    4 week follow up toenail fungus culture results     85 y.o. female presents for concern of toenail fungus. Relates she is mostly concerned with the left foot. States she has tried vicks vapor rub and neosporin. States the neosporin maybe helpful. Has also tried tea tree oil nail polish and topicals with no change. Wanting to try a culture today to figure out what is causing the nails dystrophy . Denies any other pedal complaints. Denies n/v/f/c.   Past Medical History:  Diagnosis Date   Asthma    CAD (coronary artery disease)    last cath 08/2006   Carotid artery disease (HCC)    moderate left ICA stenosis by 2.6 ultrasound   DJD (degenerative joint disease), lumbar    Hearing loss    HTN (hypertension)    Hyperlipidemia    Renal artery stenosis ([place]) 05/05/2021   Vision loss of right eye     Objective:  Physical Exam: Vascular: DP/PT pulses 2/4 bilateral. CFT <3 seconds. Normal hair growth on digits. No edema.  Skin. No lacerations or abrasions bilateral feet. Nails 1-5 are thickened discolored and elongated with subungual debris on left. Right appear normal thickness color and length. Musculoskeletal: MMT 5/5 bilateral lower extremities in DF, PF, Inversion and Eversion. Deceased ROM in DF of ankle joint.  Neurological: Sensation intact to light touch.   Assessment:   1. Onychodystrophy       Plan:  Patient was evaluated and treated and all questions answered. -Examined patient -Discussed treatment options for painful dystrophic nails  -Discussed nail culture results and microtrauma of the nail -Recommend urea nail gel for thickness.  -Discussed supportive shoe gear.   -Patient to return as needed.    Lorenda Peck, DPM

## 2021-05-15 DIAGNOSIS — H401212 Low-tension glaucoma, right eye, moderate stage: Secondary | ICD-10-CM | POA: Diagnosis not present

## 2021-05-15 DIAGNOSIS — H353131 Nonexudative age-related macular degeneration, bilateral, early dry stage: Secondary | ICD-10-CM | POA: Diagnosis not present

## 2021-05-15 DIAGNOSIS — Z961 Presence of intraocular lens: Secondary | ICD-10-CM | POA: Diagnosis not present

## 2021-05-15 DIAGNOSIS — H5213 Myopia, bilateral: Secondary | ICD-10-CM | POA: Diagnosis not present

## 2021-05-16 DIAGNOSIS — Z5181 Encounter for therapeutic drug level monitoring: Secondary | ICD-10-CM | POA: Diagnosis not present

## 2021-05-16 DIAGNOSIS — I10 Essential (primary) hypertension: Secondary | ICD-10-CM | POA: Diagnosis not present

## 2021-05-17 LAB — BASIC METABOLIC PANEL
BUN/Creatinine Ratio: 20 (ref 12–28)
BUN: 13 mg/dL (ref 8–27)
CO2: 21 mmol/L (ref 20–29)
Calcium: 9.3 mg/dL (ref 8.7–10.3)
Chloride: 94 mmol/L — ABNORMAL LOW (ref 96–106)
Creatinine, Ser: 0.65 mg/dL (ref 0.57–1.00)
Glucose: 94 mg/dL (ref 70–99)
Potassium: 5 mmol/L (ref 3.5–5.2)
Sodium: 133 mmol/L — ABNORMAL LOW (ref 134–144)
eGFR: 87 mL/min/{1.73_m2} (ref >59)

## 2021-05-19 DIAGNOSIS — G459 Transient cerebral ischemic attack, unspecified: Secondary | ICD-10-CM | POA: Diagnosis not present

## 2021-05-19 DIAGNOSIS — I10 Essential (primary) hypertension: Secondary | ICD-10-CM | POA: Diagnosis not present

## 2021-05-19 DIAGNOSIS — I2510 Atherosclerotic heart disease of native coronary artery without angina pectoris: Secondary | ICD-10-CM | POA: Diagnosis not present

## 2021-05-19 DIAGNOSIS — H35033 Hypertensive retinopathy, bilateral: Secondary | ICD-10-CM | POA: Diagnosis not present

## 2021-05-19 DIAGNOSIS — I701 Atherosclerosis of renal artery: Secondary | ICD-10-CM | POA: Diagnosis not present

## 2021-05-19 DIAGNOSIS — J4540 Moderate persistent asthma, uncomplicated: Secondary | ICD-10-CM | POA: Diagnosis not present

## 2021-05-19 DIAGNOSIS — E782 Mixed hyperlipidemia: Secondary | ICD-10-CM | POA: Diagnosis not present

## 2021-05-21 ENCOUNTER — Encounter: Payer: Self-pay | Admitting: Cardiology

## 2021-05-22 DIAGNOSIS — H401233 Low-tension glaucoma, bilateral, severe stage: Secondary | ICD-10-CM | POA: Diagnosis not present

## 2021-05-22 DIAGNOSIS — H353131 Nonexudative age-related macular degeneration, bilateral, early dry stage: Secondary | ICD-10-CM | POA: Diagnosis not present

## 2021-05-27 ENCOUNTER — Other Ambulatory Visit: Payer: Medicare Other

## 2021-05-29 DIAGNOSIS — M1711 Unilateral primary osteoarthritis, right knee: Secondary | ICD-10-CM | POA: Diagnosis not present

## 2021-06-05 ENCOUNTER — Ambulatory Visit: Payer: Medicare Other | Admitting: Cardiology

## 2021-06-16 DIAGNOSIS — M25461 Effusion, right knee: Secondary | ICD-10-CM | POA: Diagnosis not present

## 2021-06-16 DIAGNOSIS — M1711 Unilateral primary osteoarthritis, right knee: Secondary | ICD-10-CM | POA: Diagnosis not present

## 2021-06-17 DIAGNOSIS — H353131 Nonexudative age-related macular degeneration, bilateral, early dry stage: Secondary | ICD-10-CM | POA: Diagnosis not present

## 2021-06-19 DIAGNOSIS — M1711 Unilateral primary osteoarthritis, right knee: Secondary | ICD-10-CM | POA: Diagnosis not present

## 2021-06-23 DIAGNOSIS — M1711 Unilateral primary osteoarthritis, right knee: Secondary | ICD-10-CM | POA: Diagnosis not present

## 2021-06-24 NOTE — Progress Notes (Addendum)
Hypertension Clinic Initial Assessment:    Date:  06/25/2021   ID:  Kendra [name], DOB 11/09/1935, MRN [id number]  PCP:  Vernie Shanks, MD  Cardiologist:  None   Referring MD: Vernie Shanks, MD   CC: Hypertension  History of Present Illness:    Kendra [name] is a 86 y.o. female with a hx of coronary artery disease, essential hypertension, carotid artery disease, bilateral varicose veins, renal artery stenosis, moderate persistent asthma, hearing loss, skin lesions, hyperlipidemia, pedal edema, and prediabetes.  She underwent cardiac catheterization with PCI to her LAD.  She was seen 10/22 for discussion of medication management.  She was seen by Dr. Einar Gip 9/22 for blood pressure control.  She was intolerant of multiple medications.  She had a renal artery Doppler 5/22 which showed greater than 50% stenosis bilateral arteries.  Her left renal artery was not fully visualized.  She had a CTA of the abdomen and pelvis 11/22 which showed 50% proximal left renal artery stenosis and 2 right renal arteries.  Her hydralazine was reduced to 25 mg.  She was previously not able to tolerate medicine due to headaches.  She was seen by Dr. Alvester Chou 1/22 and her blood pressure was 138/70.  At the time of the visit she was on clonidine losartan.  She was seen by Dr. Oval Linsey 05/05/2021.  She was seen as part of the advanced hypertension clinic.  Her blood pressure was 214/76.  She continued to report intolerance of multiple antihypertensive medications.  Her losartan was switched to irbesartan 150 twice daily.  Hydralazine was discontinued and she was started on spironolactone.  She was instructed to start taking clonidine every 8 hours.  She noticed that her blood pressure was increasing at night.  She had noticed this for 30 years but reported that it was more prominent recently.  She was taking her evening medication around 10 PM.  She would recheck her blood pressure around 3 AM and noted blood  pressures in the 003-704 systolic range.  Her blood pressure was much better after exercise.  She noted that it had been in the 888-916 systolic range at night.  She reported increased hair loss since increasing clonidine to 3 times daily.  She stopped taking spironolactone due to her increased urination.  She did not note improvement in her blood pressure and felt sluggish.  She denied snoring and her husband have not noted apnea.  She denied daytime somnolence and was puzzled as to why her blood pressure would be elevated at night.  At the time of her last visit her clonidine was continued and her irbesartan was increased to 300 mg daily.  Follow-up was planned for 1 month.  She presents to the clinic today for follow-up evaluation states she stopped taking indapamide due to urine retention.  She continues to have frequent urination at night but reports it is less since stopping the medication.  We reviewed her diet and p.o. fluid intake.  She reports that she drinks 2 cups of coffee in the morning and also has coffee at noon.   I have asked her to stop caffeine and avoid chocolate.  She brings in her blood pressure log which shows blood pressures in the 945-038 systolic range.  She reports that she has well-controlled blood pressure after tai chi and walking.  She has limited her physical activity due to the cooler weather.  Have encouraged her to increase her physical activity as tolerated, will increase her clonidine  to 0.2 mg at bedtime and have her continue 0.1 mg in the morning and at noon.  We will have her follow-up with pharmacy in around 1 month.  Today she denies chest pain, shortness of breath, lower extremity edema, fatigue, palpitations, melena, hematuria, hemoptysis, diaphoresis, weakness, presyncope, syncope, orthopnea, and PND.  Current antihypertensive medication Clonidine 0.1 mg 3 times daily-okay to take an extra tablet as needed for blood pressure greater than 876 systolic-increased  clonidine to 0.2 mg at bedtime  Irbesartan 300 mg at bedtime  Prior Antihypertensives: Chlorthalidone Amlodipine - chills Bystolic Nifedipine Valsartan Olmesartan spironolactone   Past Medical History:  Diagnosis Date   Asthma    CAD (coronary artery disease)    last cath 08/2006   Carotid artery disease (HCC)    moderate left ICA stenosis by 2.6 ultrasound   DJD (degenerative joint disease), lumbar    Hearing loss    HTN (hypertension)    Hyperlipidemia    Renal artery stenosis ([place]) 05/05/2021   Vision loss of right eye     Past Surgical History:  Procedure Laterality Date   ABDOMINAL HYSTERECTOMY  1975   APPENDECTOMY     CORONARY ANGIOPLASTY WITH STENT PLACEMENT  08/2006   taxus stent x2 to LAD, 2.5x66m [facility]  Current Medications: Current Meds  Medication Sig   albuterol (VENTOLIN HFA) 108 (90 Base) MCG/ACT inhaler Inhale 2 puffs into the lungs every 12 (twelve) hours.   Ascorbic Acid (VITAMIN C) 1000 MG tablet Take 1,000 mg by mouth daily.   aspirin EC 81 MG tablet Take 81 mg by mouth daily. Swallow whole.   beclomethasone (QVAR) 80 MCG/ACT inhaler Inhale 1-2 puffs into the lungs 2 (two) times daily.   BIOTIN 5000 PO Take 1 tablet by mouth daily.   BLACK ELDERBERRY PO Take 575 mg by mouth daily.   calcium carbonate (OS-CAL - DOSED IN MG OF ELEMENTAL CALCIUM) 1250 (500 Ca) MG tablet Take 1 tablet by mouth daily.   cholecalciferol (VITAMIN D) 1000 UNITS tablet Take 1,000 Units by mouth daily.   clobetasol (OLUX) 0.05 % topical foam Apply 1 application topically every 3 (three) days.   cloNIDine (CATAPRES) 0.1 MG tablet Take 1 tablet (0.1 mg total) by mouth in the morning, at noon, and at bedtime. Ok to take an extra tablet as needed for blood pressure above 160 Needs [employer] manufacturer   cloNIDine (CATAPRES) 0.2 MG tablet Take 1 tablet (0.2 mg  total) by mouth at bedtime.   Coenzyme Q10 (CO Q-10) 100 MG CAPS Take 100 mg by mouth daily.    docusate sodium (COLACE) 100 MG capsule Take 100 mg by mouth daily as needed.   fluticasone (FLONASE) 50 MCG/ACT nasal spray Place 1 spray into both nostrils daily as needed.   Homeopathic Products (ARNICA EX) Apply topically as needed. Sore areas   irbesartan (AVAPRO) 300 MG tablet Take 1 tablet (300 mg total) by mouth at bedtime.   ketoconazole (NIZORAL) 2 % cream Apply 1 application topically daily. Apply to toe   Multiple Vitamin (MULTIVITAMIN) capsule Take 1 capsule by mouth daily.   rosuvastatin (CRESTOR) 10 MG tablet Take 10 mg by mouth every other day.   triamcinolone (KENALOG) 0.1 % Apply 1 application topically 2 (two) times daily as needed.   Wheat Dextrin (BENEFIBER DRINK MIX PO) Take 1 Dose by mouth daily as  needed.     Allergies:   Chlorthalidone, Other, Amlodipine, Ciprofloxacin, Dog epithelium allergy skin test, Latex, Statins, Sulfa antibiotics, Sulfamethoxazole, Zetia [ezetimibe], and Penicillins   Social History   Socioeconomic History   Marital status: Married    Spouse name: Not on file   Number of children: 1   Years of education: MAx2   Highest education level: Not on file  Occupational History   Occupation: Retired   Occupation: Education officer, museum  Tobacco Use   Smoking status: Former    Types: Cigarettes    Quit date: 06/08/1966    Years since quitting: 55.0   Smokeless tobacco: Never  Vaping Use   Vaping Use: Never used  Substance and Sexual Activity   Alcohol use: Not Currently    Alcohol/week: 1.0 standard drink    Types: 1 Standard drinks or equivalent per week    Comment: wine   Drug use: No   Sexual activity: Not on file  Other Topics Concern   Not on file  Social History Narrative   Not on file   Social Determinants of Health   Financial Resource Strain: Not on file  Food Insecurity: Not on file  Transportation Needs: Not on file  Physical  Activity: Sufficiently Active   Days of Exercise per Week: 5 days   Minutes of Exercise per Session: 30 min  Stress: Not on file  Social Connections: Not on file     Family History: The patient's family history includes Breast cancer in her maternal grandmother; Cancer in her maternal grandmother and mother; Diabetes in her father; Heart attack in her mother and paternal grandfather; Heart failure in her father; Hyperlipidemia in her sister.  ROS:   Please see the history of present illness.     All other systems reviewed and are negative.  EKGs/Labs/Other Studies Reviewed:    EKG:  EKG is  ordered today.  The ekg ordered today demonstrates sinus bradycardia 55 bpm  CT-A Abd/pelvis 04/08/21: IMPRESSION: 1. No evidence of high-grade renal artery stenosis by CTA. Single left renal artery demonstrates approximately 50% proximal stenosis. There are 2 separate right renal arteries emanating from the abdominal aorta. The larger of these to arteries demonstrates approximately 40% proximal stenosis. The smaller right renal artery demonstrates no stenosis. 2. Mild plaque at the origins of the celiac axis and SMA without evidence of significant stenosis. Some degree of stenosis is felt to be present at the origin of the IMA. 3. Calcified plaque at the origins of bilateral common iliac arteries causing roughly 50% bilateral stenoses.   PCV Renal Artery Duplex 10/17/2020: Hemodynamically significant stenosis bilaterally. Left renal artery not  well visualized. >50% stenosis bilaterally. Normal intrarenal vascular  perfusion is noted in the right kidney. Renal length is within normal limits for right kidney. Abdominal aorta shows atherosclerotic changes and mild ectasia, consider dedicated aortic duplex if clinically indicated.    Carotid Duplex 07/05/2020: Summary:  Right Carotid: Velocities in the right ICA are consistent with a 1-39%  stenosis.   Left Carotid: Velocities in the left ICA  are consistent with a 40-59%  stenosis.   Vertebrals:  Bilateral vertebral arteries demonstrate antegrade flow.  Subclavians: Normal flow hemodynamics were seen in bilateral subclavian arteries.    Echo 04/10/2020:  1. Left ventricular ejection fraction, by estimation, is 60 to 65%. The  left ventricle has normal function. The left ventricle has no regional  wall motion abnormalities. Left ventricular diastolic parameters are  consistent with Grade I diastolic  dysfunction (impaired relaxation). Elevated left ventricular end-diastolic  pressure.   2. Right ventricular systolic function is normal. The right ventricular  size is normal. There is normal pulmonary artery systolic pressure.   3. Left atrial size was mildly dilated.   4. The mitral valve is normal in structure. Trivial mitral valve  regurgitation. No evidence of mitral stenosis. Moderate mitral annular  calcification.   5. The aortic valve is tricuspid. Aortic valve regurgitation is not  visualized. No aortic stenosis is present.   6. The inferior vena cava is normal in size with greater than 50%  respiratory variability, suggesting right atrial pressure of 3 mmHg.   Recent Labs: 05/16/2021: BUN 13; Creatinine, Ser 0.65; Potassium 5.0; Sodium 133   Recent Lipid Panel No results found for: CHOL, TRIG, HDL, CHOLHDL, VLDL, LDLCALC, LDLDIRECT  Physical Exam:    VS:  BP (!) 150/84    Pulse (!) 55    Ht _0  (1.651 m)    Wt 123 lb 6.4 oz (56 kg)    SpO2 96%    BMI 20.53 kg/m     Wt Readings from Last 3 Encounters:  06/25/21 123 lb 6.4 oz (56 kg)  05/05/21 122 lb 3.2 oz (55.4 kg)  04/03/21 123 lb 4.8 oz (55.9 kg)     GEN:  Well nourished, well developed in no acute distress HEENT: Normal NECK: No JVD; No carotid bruits LYMPHATICS: No lymphadenopathy CARDIAC: RRR, no murmurs, rubs, gallops RESPIRATORY:  Clear to auscultation without rales, wheezing or rhonchi  ABDOMEN: Soft, non-tender, non-distended MUSCULOSKELETAL:   No edema; No deformity  SKIN: Warm and dry NEUROLOGIC:  Alert and oriented x 3 PSYCHIATRIC:  Normal affect     Assessment/PLAN:    Essential hypertension-BP today 150/84.  Continues to be elevated at home.  Blood pressure better after exercise.  She was unable to tolerate indapamide due to urine retention.  Well-controlled at home since increase with irbesartan.  Renal artery stenosis, details below.  Continues to report spikes in blood pressure overnight. Continue irbesartan,  Increase clonidine at bedtime 0.2 mg and continue 0.1 mg a.m. and at noon. Heart healthy low-sodium diet-salty 6 reviewed Increase physical activity as tolerated Stop coffee and chocolate  Renal artery stenosis-moderate.  Details above.  Previously discussed consideration for PCSK9 inhibitor due to intolerance.  Recommendation for adding only 1 medication at a time due to multiple intolerances.  Continue aspirin, rosuvastatin    Disposition:    FU with MD/PharmD in 1 months in-office    Medication Adjustments/Labs and Tests Ordered: Current medicines are reviewed at length with the patient today.  Concerns regarding medicines are outlined above.  Orders Placed This Encounter  Procedures   EKG 12-Lead   Meds ordered this encounter  Medications   cloNIDine (CATAPRES) 0.2 MG tablet    Sig: Take 1 tablet (0.2 mg total) by mouth at bedtime.    Dispense:  90 tablet    Refill:  3    Pt will be taking 0.1 mg in the morning and at noon     Signed, Deberah Pelton, NP  06/25/2021 3:45 PM    [place]

## 2021-06-25 ENCOUNTER — Ambulatory Visit (INDEPENDENT_AMBULATORY_CARE_PROVIDER_SITE_OTHER): Payer: Medicare Other | Admitting: General Practice

## 2021-06-25 ENCOUNTER — Other Ambulatory Visit: Payer: Self-pay

## 2021-06-25 ENCOUNTER — Encounter (HOSPITAL_BASED_OUTPATIENT_CLINIC_OR_DEPARTMENT_OTHER): Payer: Self-pay | Admitting: General Practice

## 2021-06-25 VITALS — BP 150/84 | HR 55 | Ht 65.0 in | Wt 123.4 lb

## 2021-06-25 DIAGNOSIS — I10 Essential (primary) hypertension: Secondary | ICD-10-CM | POA: Diagnosis not present

## 2021-06-25 DIAGNOSIS — I701 Atherosclerosis of renal artery: Secondary | ICD-10-CM | POA: Diagnosis not present

## 2021-06-25 MED ORDER — CLONIDINE HCL 0.2 MG PO TABS: 0.2000 mg | ORAL_TABLET | Freq: Every evening | ORAL | 3 refills | Status: DC

## 2021-06-25 MED ORDER — CLONIDINE HCL 0.1 MG PO TABS: ORAL_TABLET | ORAL | 3 refills | Status: DC

## 2021-06-25 NOTE — Patient Instructions (Signed)
Medication Instructions:  TAKE- Clonidine 0.1 mg by mouth in the morning and at Noon INCREASE- Clonidine 0.2 mg by mouth at bedtime  *If you need a refill on your cardiac medications before your next appointment, please call your pharmacy*   Lab Work: None Ordered   Testing/Procedures: None Ordered   Follow-Up: At [facility], you and your health needs are our priority.  As part of our continuing mission to provide you with exceptional heart care, we have created designated Provider Care Teams.  These Care Teams include your primary Cardiologist (physician) and Advanced Practice Providers (APPs -  Physician Assistants and Nurse Practitioners) who all work together to provide you with the care you need, when you need it.  We recommend signing up for the patient portal called "MyChart".  Sign up information is provided on this After Visit Summary.  MyChart is used to connect with patients for Virtual Visits (Telemedicine).  Patients are able to view lab/test results, encounter notes, upcoming appointments, etc.  Non-urgent messages can be sent to your provider as well.   To learn more about what you can do with MyChart, go to [email or web address].    Your next appointment:   1 month(s)  The format for your next appointment:   In Person  Provider: Pharmacist  Other Instructions Avoid drinking Caffeine Avoid eating Chocolate Increase Fiber in diet

## 2021-06-27 DIAGNOSIS — M1711 Unilateral primary osteoarthritis, right knee: Secondary | ICD-10-CM | POA: Diagnosis not present

## 2021-06-30 DIAGNOSIS — M1711 Unilateral primary osteoarthritis, right knee: Secondary | ICD-10-CM | POA: Diagnosis not present

## 2021-07-04 DIAGNOSIS — M1711 Unilateral primary osteoarthritis, right knee: Secondary | ICD-10-CM | POA: Diagnosis not present

## 2021-07-11 DIAGNOSIS — M1711 Unilateral primary osteoarthritis, right knee: Secondary | ICD-10-CM | POA: Diagnosis not present

## 2021-07-14 DIAGNOSIS — M1711 Unilateral primary osteoarthritis, right knee: Secondary | ICD-10-CM | POA: Diagnosis not present

## 2021-07-17 DIAGNOSIS — L649 Androgenic alopecia, unspecified: Secondary | ICD-10-CM | POA: Diagnosis not present

## 2021-07-18 DIAGNOSIS — M1711 Unilateral primary osteoarthritis, right knee: Secondary | ICD-10-CM | POA: Diagnosis not present

## 2021-07-21 DIAGNOSIS — M1711 Unilateral primary osteoarthritis, right knee: Secondary | ICD-10-CM | POA: Diagnosis not present

## 2021-07-28 DIAGNOSIS — M1711 Unilateral primary osteoarthritis, right knee: Secondary | ICD-10-CM | POA: Diagnosis not present

## 2021-07-30 DIAGNOSIS — M1711 Unilateral primary osteoarthritis, right knee: Secondary | ICD-10-CM | POA: Diagnosis not present

## 2021-07-31 DIAGNOSIS — Z8673 Personal history of transient ischemic attack (TIA), and cerebral infarction without residual deficits: Secondary | ICD-10-CM | POA: Diagnosis not present

## 2021-07-31 DIAGNOSIS — H25093 Other age-related incipient cataract, bilateral: Secondary | ICD-10-CM | POA: Diagnosis not present

## 2021-07-31 DIAGNOSIS — I25118 Atherosclerotic heart disease of native coronary artery with other forms of angina pectoris: Secondary | ICD-10-CM | POA: Diagnosis not present

## 2021-07-31 DIAGNOSIS — I701 Atherosclerosis of renal artery: Secondary | ICD-10-CM | POA: Diagnosis not present

## 2021-07-31 DIAGNOSIS — H35033 Hypertensive retinopathy, bilateral: Secondary | ICD-10-CM | POA: Diagnosis not present

## 2021-07-31 DIAGNOSIS — R739 Hyperglycemia, unspecified: Secondary | ICD-10-CM | POA: Diagnosis not present

## 2021-07-31 DIAGNOSIS — E782 Mixed hyperlipidemia: Secondary | ICD-10-CM | POA: Diagnosis not present

## 2021-07-31 DIAGNOSIS — J4540 Moderate persistent asthma, uncomplicated: Secondary | ICD-10-CM | POA: Diagnosis not present

## 2021-07-31 DIAGNOSIS — I10 Essential (primary) hypertension: Secondary | ICD-10-CM | POA: Diagnosis not present

## 2021-07-31 DIAGNOSIS — M1710 Unilateral primary osteoarthritis, unspecified knee: Secondary | ICD-10-CM | POA: Diagnosis not present

## 2021-07-31 DIAGNOSIS — I459 Conduction disorder, unspecified: Secondary | ICD-10-CM | POA: Diagnosis not present

## 2021-08-01 ENCOUNTER — Other Ambulatory Visit: Payer: Self-pay

## 2021-08-01 ENCOUNTER — Ambulatory Visit (INDEPENDENT_AMBULATORY_CARE_PROVIDER_SITE_OTHER): Payer: Medicare Other | Admitting: Pharmacist Clinician (PhC)/ Clinical Pharmacy Specialist

## 2021-08-01 DIAGNOSIS — I701 Atherosclerosis of renal artery: Secondary | ICD-10-CM | POA: Diagnosis not present

## 2021-08-01 DIAGNOSIS — I10 Essential (primary) hypertension: Secondary | ICD-10-CM

## 2021-08-01 DIAGNOSIS — E782 Mixed hyperlipidemia: Secondary | ICD-10-CM | POA: Diagnosis not present

## 2021-08-01 DIAGNOSIS — R739 Hyperglycemia, unspecified: Secondary | ICD-10-CM | POA: Diagnosis not present

## 2021-08-01 MED ORDER — CLONIDINE HCL 0.1 MG/24HR TD PTWK: 0.1000 mg | MEDICATED_PATCH | TRANSDERMAL | 12 refills | Status: DC

## 2021-08-01 MED ORDER — IRBESARTAN 150 MG PO TABS: 150.0000 mg | ORAL_TABLET | Freq: Two times a day (BID) | ORAL | 3 refills | Status: DC

## 2021-08-01 NOTE — Patient Instructions (Signed)
Return for a a follow up appointment Monday April 10 at 2:30 pm at [facility] your blood pressure at home daily and keep record of the readings.  Take your BP meds as follows:  Start clonidine patches.  Apply one patch each week, use a different site each week.    Day 1 - apply patch and take 4/24 hours   Day 2 - take 1/2 tablet 4/24 hours   Day 3 - take 1/2 tablet in AM and 1/2 at bedtime   Bring all of your meds, your BP cuff and your record of home blood pressures to your next appointment.  Exercise as youre able, try to walk approximately 30 minutes per day.  Keep salt intake to a minimum, especially watch canned and prepared boxed foods.  Eat more fresh fruits and vegetables and fewer canned items.  Avoid eating in fast food restaurants.    HOW TO TAKE YOUR BLOOD PRESSURE: Rest 5 minutes before taking your blood pressure.  Dont smoke or drink caffeinated beverages for at least 30 minutes before. Take your blood pressure before (not after) you eat. Sit comfortably with your back supported and both feet on the floor (dont cross your legs). Elevate your arm to heart level on a table or a desk. Use the proper sized cuff. It should fit smoothly and snugly around your bare upper arm. There should be enough room to slip a fingertip under the cuff. The bottom edge of the cuff should be 1 inch above the crease of the elbow. Ideally, take 3 measurements at one sitting and record the average.

## 2021-08-01 NOTE — Progress Notes (Signed)
08/04/2021 Kendra [name] 08-13-1935 [id number]   HPI:  Kendra [name] is a 86 y.o. female patient of Dr Oval Linsey, with a [place] below who presents today for hypertension clinic evaluation.  She was seen by Dr. Oval Linsey 05/05/2021.  She was seen as part of the advanced hypertension clinic.  Her blood pressure was 214/76.  She continued to report intolerance of multiple antihypertensive medications.  Her losartan was switched to irbesartan 150 twice daily.  Hydralazine was discontinued and she was started on spironolactone.  She was instructed to start taking clonidine every 8 hours.  She noticed that her blood pressure was increasing at night.  She had noticed this for 30 years but reported that it was more prominent recently.  She was taking her evening medication around 10 PM.  She would recheck her blood pressure around 3 AM and noted blood pressures in the 903-009 systolic range.  Her blood pressure was much better after exercise.  She noted that it had been in the 233-007 systolic range at night.  She reported increased hair loss since increasing clonidine to 3 times daily.  She stopped taking spironolactone due to her increased urination.  She did not note improvement in her blood pressure and felt sluggish.  She denied snoring and her husband have not noted apnea.  She denied daytime somnolence and was puzzled as to why her blood pressure would be elevated at night.  When she saw Odie Sera last month she was asked to avoid caffeine, and he increased the clonidine at bedtime to 0.2 mg, with 0.1 mg still in the mornings and at mid-day.    She returns today for follow up.  She did not increase bedtime clonidine to 0.2 mg, but instead is taking 1 tablet about every 6 hours (even at 3 am).  She has been reading up on nocturnal hypertension.  She has complaints about her medications today, but is aware that there are not many choices for her to try.  States the irbesartan, when taken all  300 mg at once, causes gaging sensation completely unrelated to swallowing the tablet, notes it also prevents her from urinating completely and causes occasional vision problems.  The clonidine causes her to be constipated.  And the combination is contributing to her alopecia.    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%, right - 2 arteries, larger 40% blocked  ASCVD 2008 - overlapping stents to LAD; left ICA 40-59% stenosis; 50% bilateral common iliac stenosis  hyperlipidemia On rosuvastatin 10 qod; ideally needs PCSK-9     Blood Pressure Goal:  130/80  Current Medications: irbesartan 150 mg bid, clonidine 0.1 mg am/mid-day, 0.2 mg hs  Family Hx: both parents  - mother had 3 MI between 47-75; father died CHF, DM2;1 sister healthy; daughter healthy in her 52's  Social Hx: no tobacco, no alcohol, gave up dark chocolate, coffee is decaf, now has tea in afternoon;   Diet: mix of home cooked, eats out 1-2 times per week; pescitarian, egg white, otherwise vegetarian; vegetables fresh or frozen, beans and almond cheese for protein  Exercise: walks when able (weather dependent), does own housework, tai chi  Home BP readings:   Daytime (10 am - 10 pm) 22 readings average 140/68 (range 121-160/53-90)  Nighttime (10 pm - 10 am) 25 readings average 176/91 (range 159-211/73-106)  Intolerances:   Chlorthalidone - hyponatremia  Amlodipine - shivers  Hydralazine - headaches (in higher doses)  Spironolactone - frequent urination, felt sluggish  Labs: 12/22:  Na 133, K 5.0, Glu 94, BN 13, SCr 0.65, GFR 87   Wt Readings from Last 3 Encounters:  08/01/21 122 lb (55.3 kg)  06/25/21 123 lb 6.4 oz (56 kg)  05/05/21 122 lb 3.2 oz (55.4 kg)   BP Readings from Last 3 Encounters:  08/01/21 (!) 160/73  06/25/21 (!) 150/84  05/05/21 (!) 176/72   Pulse Readings from Last 3 Encounters:  08/01/21 (!) 59  06/25/21 (!) 55  05/05/21 (!) 50    Current Outpatient Medications  Medication Sig Dispense  Refill   albuterol (VENTOLIN HFA) 108 (90 Base) MCG/ACT inhaler Inhale 2 puffs into the lungs every 12 (twelve) hours.     Ascorbic Acid (VITAMIN C) 1000 MG tablet Take 1,000 mg by mouth daily.     aspirin EC 81 MG tablet Take 81 mg by mouth daily. Swallow whole.     beclomethasone (QVAR) 80 MCG/ACT inhaler Inhale 1-2 puffs into the lungs 2 (two) times daily.     BIOTIN 5000 PO Take 1 tablet by mouth daily.     BLACK ELDERBERRY PO Take 575 mg by mouth daily.     calcium carbonate (OS-CAL - DOSED IN MG OF ELEMENTAL CALCIUM) 1250 (500 Ca) MG tablet Take 1 tablet by mouth daily.     cholecalciferol (VITAMIN D) 1000 UNITS tablet Take 1,000 Units by mouth daily.     clobetasol (OLUX) 0.05 % topical foam Apply 1 application topically every 3 (three) days.     Coenzyme Q10 (CO Q-10) 100 MG CAPS Take 100 mg by mouth daily.      docusate sodium (COLACE) 100 MG capsule Take 100 mg by mouth daily as needed.     fluticasone (FLONASE) 50 MCG/ACT nasal spray Place 1 spray into both nostrils daily as needed.     Homeopathic Products (ARNICA EX) Apply topically as needed. Sore areas     irbesartan (AVAPRO) 150 MG tablet Take 1 tablet (150 mg total) by mouth 2 (two) times daily. 180 tablet 3   ketoconazole (NIZORAL) 2 % cream Apply 1 application topically daily. Apply to toe     Multiple Vitamin (MULTIVITAMIN) capsule Take 1 capsule by mouth daily.     polyethylene glycol (MIRALAX / GLYCOLAX) 17 g packet Take 17 g by mouth daily.     rosuvastatin (CRESTOR) 10 MG tablet Take 10 mg by mouth every other day.     triamcinolone (KENALOG) 0.1 % Apply 1 application topically 2 (two) times daily as needed.     Wheat Dextrin (BENEFIBER DRINK MIX PO) Take 1 Dose by mouth daily as needed.     cloNIDine (CATAPRES - DOSED IN MG/24 HR) 0.1 mg/24hr patch Place 1 patch (0.1 mg total) onto the skin once a week. 4 patch 12   No current facility-administered medications for this visit.    Allergies  Allergen Reactions    Chlorthalidone Other (See Comments)    Hyponatremia   Other     Other reaction(s): Respiratory Distress   Amlodipine     "shivers"    Ciprofloxacin    Dog Epithelium Allergy Skin Test    Latex Other (See Comments)    Irritates her skin   Statins     Unable to tolerate statins w the exception of crestor.   Sulfa Antibiotics Other (See Comments)    "didnt feel good"   Sulfamethoxazole Other (See Comments)    Makes her feel bad   Zetia [Ezetimibe] Other (See Comments)    "  makes me constipated"   Penicillins Rash    Past Medical History:  Diagnosis Date   Asthma    CAD (coronary artery disease)    last cath 08/2006   Carotid artery disease (HCC)    moderate left ICA stenosis by 2.6 ultrasound   DJD (degenerative joint disease), lumbar    Hearing loss    HTN (hypertension)    Hyperlipidemia    Renal artery stenosis ([place]) 05/05/2021   Vision loss of right eye     Blood pressure (!) 160/73, pulse (!) 59, height _0  (1.651 m), weight 122 lb (55.3 kg).  Essential hypertension Patient with essential hypertension, hard to control due to multiple medication sensitivities.  Currently maintained on irbesartan and clonidine.  Because her readings are still quite varied, will have her try using clonidine patches.   On the day she applies the patch, she will need to take her full clonidine dose.  Next day will cut dose to 1/2 tablet qid, and on third day 1/2 tablet am and hs.  Will have her try this and follow up in 6 weeks at the [facility] office.     Tommy Medal PharmD CPP [facility] [address] [place], [place] [address] [phone]

## 2021-08-04 ENCOUNTER — Telehealth (HOSPITAL_BASED_OUTPATIENT_CLINIC_OR_DEPARTMENT_OTHER): Payer: Self-pay | Admitting: *Deleted

## 2021-08-04 ENCOUNTER — Encounter (HOSPITAL_BASED_OUTPATIENT_CLINIC_OR_DEPARTMENT_OTHER): Payer: Self-pay | Admitting: Pharmacist Clinician (PhC)/ Clinical Pharmacy Specialist

## 2021-08-04 DIAGNOSIS — M1711 Unilateral primary osteoarthritis, right knee: Secondary | ICD-10-CM | POA: Diagnosis not present

## 2021-08-04 MED ORDER — IRBESARTAN 300 MG PO TABS: ORAL_TABLET | ORAL | 3 refills | Status: DC

## 2021-08-04 NOTE — Assessment & Plan Note (Signed)
Patient with essential hypertension, hard to control due to multiple medication sensitivities.  Currently maintained on irbesartan and clonidine.  Because her readings are still quite varied, will have her try using clonidine patches.   On the day she applies the patch, she will need to take her full clonidine dose.  Next day will cut dose to 1/2 tablet qid, and on third day 1/2 tablet am and hs.  Will have her try this and follow up in 6 weeks at the [facility] office.

## 2021-08-04 NOTE — Telephone Encounter (Signed)
Received message from [facility] Irbesartan 150 mg twice a day not covered by insurance Will change to Irbesartan 300 mg 1/2 tablet twice a day

## 2021-08-04 NOTE — Telephone Encounter (Signed)
Spoke with patient who stated that the Irbesartan 300 mg 1/2 tablet twice a day bothers her stomach Stated discussed this at Pharm D appointment last week  Will forward to Dr Oval Linsey and Cammy Brochure D for review

## 2021-08-06 ENCOUNTER — Telehealth (HOSPITAL_BASED_OUTPATIENT_CLINIC_OR_DEPARTMENT_OTHER): Payer: Self-pay | Admitting: Cardiovascular Disease

## 2021-08-06 NOTE — Telephone Encounter (Addendum)
BP lower today at 115-120/ 60-80's ?116/55 48 ?122/74 90 ? ?Patient states she is having " mild pressure" or "numbness. But not really numb" or "puffy but not really puffy"  ? ?Now talking about a carotid artery scan done yearly- saw the pharmacy  ? ?  ?BP reading's  ?181/88 ?198/101  ?191/93  ?  ?The patient said she had to take extra clonidine pills. She just started wearing the clonidine patch today  ?  ?Patient said she also had two stents put in ~15 years ago and wanted to know if there was any sort of intervention to be done to check on those stents. If she is not at home please try her on her cell phone [phone] ?

## 2021-08-06 NOTE — Telephone Encounter (Signed)
? ?  Left message for patient to call back ? ? ? ? ? ? ? ? ?Pt c/o BP issue: STAT if pt c/o blurred vision, one-sided weakness or slurred speech ?  ?1. What are your last 5 BP readings?  ?181/88 ?198/101  ?191/93  ?  ?2. Are you having any other symptoms (ex. Dizziness, headache, blurred vision, passed out)? no ?  ?3. What is your BP issue? Patient said her BP is high all day but her HR is low. She said it usually only goes up at night. ?  ?The patient said she had to take extra clonidine pills. She just started wearing the clonidine patch today  ?  ?Patient said she also had two stents put in ~15 years ago and wanted to know if there was any sort of intervention to be done to check on those stents. If she is not at home please try her on her cell phone [phone] ?

## 2021-08-06 NOTE — Telephone Encounter (Signed)
Pt c/o BP issue: STAT if pt c/o blurred vision, one-sided weakness or slurred speech ? ?1. What are your last 5 BP readings?  ?181/88 ?198/101  ?191/93  ? ?2. Are you having any other symptoms (ex. Dizziness, headache, blurred vision, passed out)? no ? ?3. What is your BP issue? Patient said her BP is high all day but her HR is low. She said it usually only goes up at night. ? ?The patient said she had to take extra clonidine pills. She just started wearing the clonidine patch today  ? ?Patient said she also had two stents put in ~15 years ago and wanted to know if there was any sort of intervention to be done to check on those stents. If she is not at home please try her on her cell phone [phone] ?

## 2021-08-06 NOTE — Telephone Encounter (Signed)
Patient returning call.

## 2021-08-07 ENCOUNTER — Telehealth (HOSPITAL_BASED_OUTPATIENT_CLINIC_OR_DEPARTMENT_OTHER): Payer: Self-pay | Admitting: Cardiovascular Disease

## 2021-08-07 NOTE — Telephone Encounter (Signed)
error 

## 2021-08-07 NOTE — Telephone Encounter (Signed)
RN updated patient, patient is unhappy with the recommendation and RN is scheduled for first available appointment with Dr. Oval Linsey ? ? ? ?Blood pressures are looking better.  Keep wearing the clonidine patch.  We can talk more about her stents in the office when she comes back next.  We do not routinely do any testing to check on cardiac stents.  ?

## 2021-08-07 NOTE — Telephone Encounter (Signed)
Kendra [name] suggested patient take with food, Dr Oval Linsey agreed  ?Called and spoke with patient and she was able to get on [facility]  ?

## 2021-08-08 DIAGNOSIS — M1711 Unilateral primary osteoarthritis, right knee: Secondary | ICD-10-CM | POA: Diagnosis not present

## 2021-08-11 DIAGNOSIS — M1711 Unilateral primary osteoarthritis, right knee: Secondary | ICD-10-CM | POA: Diagnosis not present

## 2021-08-14 DIAGNOSIS — M1711 Unilateral primary osteoarthritis, right knee: Secondary | ICD-10-CM | POA: Diagnosis not present

## 2021-09-15 ENCOUNTER — Ambulatory Visit (INDEPENDENT_AMBULATORY_CARE_PROVIDER_SITE_OTHER): Payer: Medicare Other | Admitting: Pharmacist Clinician (PhC)/ Clinical Pharmacy Specialist

## 2021-09-15 VITALS — BP 148/78

## 2021-09-15 DIAGNOSIS — I10 Essential (primary) hypertension: Secondary | ICD-10-CM

## 2021-09-15 MED ORDER — LISINOPRIL 40 MG PO TABS: 40.0000 mg | ORAL_TABLET | Freq: Every day | ORAL | 3 refills | Status: DC

## 2021-09-15 NOTE — Patient Instructions (Signed)
Return for a a follow up appointment with Dr. Oval Linsey on May 9 ? ?Go to the lab in 2 weeks to check kidney function ? ?Check your blood pressure at home daily and keep record of the readings. ? ?Take your BP meds as follows: ? Stop irbesartan  ? Start lisinopril 40 mg once daily ? Continue clonidine  four times daily ? ?Bring all of your meds, your BP cuff and your record of home blood pressures to your next appointment.  Exercise as you?re able, try to walk approximately 30 minutes per day.  Keep salt intake to a minimum, especially watch canned and prepared boxed foods.  Eat more fresh fruits and vegetables and fewer canned items.  Avoid eating in fast food restaurants.  ? ? HOW TO TAKE YOUR BLOOD PRESSURE: ?Rest 5 minutes before taking your blood pressure. ? Don?t smoke or drink caffeinated beverages for at least 30 minutes before. ?Take your blood pressure before (not after) you eat. ?Sit comfortably with your back supported and both feet on the floor (don?t cross your legs). ?Elevate your arm to heart level on a table or a desk. ?Use the proper sized cuff. It should fit smoothly and snugly around your bare upper arm. There should be enough room to slip a fingertip under the cuff. The bottom edge of the cuff should be 1 inch above the crease of the elbow. ?Ideally, take 3 measurements at one sitting and record the average. ? ? ?

## 2021-09-15 NOTE — Progress Notes (Signed)
? ? ? ?09/16/2021 ?Kendra [name] ?12/21/1935 ?[id number] ? ? ?HPI:  Kendra [name] is a 86 y.o. female patient of Dr Oval Linsey, with a [place] below who presents today for hypertension clinic evaluation.  She was seen by Dr. Oval Linsey 05/05/2021.  She was seen as part of the advanced hypertension clinic.  Her blood pressure was 214/76.  She continued to report intolerance of multiple antihypertensive medications.  Her losartan was switched to irbesartan 150 twice daily.  Hydralazine was discontinued and she was started on spironolactone.  She was instructed to start taking clonidine every 8 hours.  She noticed that her blood pressure was increasing at night.  She had noticed this for 30 years but reported that it was more prominent recently.  She was taking her evening medication around 10 PM.  She would recheck her blood pressure around 3 AM and noted blood pressures in the 436-067 systolic range.  Her blood pressure was much better after exercise.  She noted that it had been in the 703-403 systolic range at night.  She reported increased hair loss since increasing clonidine to 3 times daily.  She stopped taking spironolactone due to her increased urination.  She did not note improvement in her blood pressure and felt sluggish.  She denied snoring and her husband have not noted apnea.  She denied daytime somnolence and was puzzled as to why her blood pressure would be elevated at night.  When she saw Kendra [name] last month she was asked to avoid caffeine, and he increased the clonidine at bedtime to 0.2 mg, with 0.1 mg still in the mornings and at mid-day.  She instead took 0.1 mg at bedtime then another 0.1 mg around 3 am.  She feels that the combination of medications is responsible for alopecia.   ? ?At last visit switched clonidine tablets to patches. Patient was given instructions on tapering tablets for a smooth transition.  She returns today for follow up.  She only used the patches for 2-3 weeks, then  stopped because they "probably were causing problems".  She felt as though the medication wasn't being evenly distributed over 24 hours, and therefore went back to taking 0.1 mg qid.  She is also adamant that the irbesartan is causing problems.  States she looked up side effects on multiple reliable websites and believes it is the cause of her recent change in vision prescription, constipation and runny nose.  She also believes it is causing her hearing to fade - she is having more trouble hearing higher pitches.  I tried to suggest that much of this could just be a function of aging, but she wants something different.  She continues to check home BP four times daily despite [place] asking her to check no more than twice.  Notes that she quit taking it at 2-3 am when she will wake, but her home readings indicate she did check at least 8 of the last 14 nights, most recently early Monday morning. ? ? ?Past Medical History: ?RAS Left - 50%, right - 2 arteries, larger 40% blocked  ?ASCVD 2008 - overlapping stents to LAD; left ICA 40-59% stenosis; 50% bilateral common iliac stenosis  ?hyperlipidemia On rosuvastatin 10 qod; ideally needs PCSK-9  ?  ? ?Blood Pressure Goal:  130/80 ? ?Current Medications: irbesartan 150 mg bid, clonidine 0.1 mg qid ? ?Family Hx: both parents  - mother had 3 MI between 26-75; father died CHF, DM2;1 sister healthy; daughter healthy in her 38's ? ?  Social Hx: no tobacco, no alcohol, gave up dark chocolate, coffee is decaf, now has tea in afternoon;  ? ?Diet: mix of home cooked, eats out 1-2 times per week; pescitarian, egg white, otherwise vegetarian; vegetables fresh or frozen, beans and almond cheese for protein ? ?Exercise: walks when able (weather dependent), does own housework, tai chi ? ?Home BP readings: patient continues to check 2-5 times per day.  Most readings range 387-564'P systolic, occasional lower reading, usually around 2-5 pm.   ?  ?Intolerances:  ? Chlorthalidone -  hyponatremia ? Amlodipine - shivers ? Hydralazine - headaches (in higher doses) ? Spironolactone - frequent urination, felt sluggish ?  ? ?Labs: 12/22:  Na 133, K 5.0, Glu 94, BN 13, SCr 0.65, GFR 87 ? ? ?Wt Readings from Last 3 Encounters:  ?08/01/21 122 lb (55.3 kg)  ?06/25/21 123 lb 6.4 oz (56 kg)  ?05/05/21 122 lb 3.2 oz (55.4 kg)  ? ?BP Readings from Last 3 Encounters:  ?08/01/21 (!) 160/73  ?06/25/21 (!) 150/84  ?05/05/21 (!) 176/72  ? ?Pulse Readings from Last 3 Encounters:  ?08/01/21 (!) 59  ?06/25/21 (!) 55  ?05/05/21 (!) 50  ? ? ?Current Outpatient Medications  ?Medication Sig Dispense Refill  ? lisinopril (ZESTRIL) 40 MG tablet Take 1 tablet (40 mg total) by mouth daily. 30 tablet 3  ? albuterol (VENTOLIN HFA) 108 (90 Base) MCG/ACT inhaler Inhale 2 puffs into the lungs every 12 (twelve) hours.    ? Ascorbic Acid (VITAMIN C) 1000 MG tablet Take 1,000 mg by mouth daily.    ? aspirin EC 81 MG tablet Take 81 mg by mouth daily. Swallow whole.    ? beclomethasone (QVAR) 80 MCG/ACT inhaler Inhale 1-2 puffs into the lungs 2 (two) times daily.    ? BIOTIN 5000 PO Take 1 tablet by mouth daily.    ? BLACK ELDERBERRY PO Take 575 mg by mouth daily.    ? calcium carbonate (OS-CAL - DOSED IN MG OF ELEMENTAL CALCIUM) 1250 (500 Ca) MG tablet Take 1 tablet by mouth daily.    ? cholecalciferol (VITAMIN D) 1000 UNITS tablet Take 1,000 Units by mouth daily.    ? clobetasol (OLUX) 0.05 % topical foam Apply 1 application topically every 3 (three) days.    ? cloNIDine (CATAPRES - DOSED IN MG/24 HR) 0.1 mg/24hr patch Place 1 patch (0.1 mg total) onto the skin once a week. 4 patch 12  ? Coenzyme Q10 (CO Q-10) 100 MG CAPS Take 100 mg by mouth daily.     ? docusate sodium (COLACE) 100 MG capsule Take 100 mg by mouth daily as needed.    ? fluticasone (FLONASE) 50 MCG/ACT nasal spray Place 1 spray into both nostrils daily as needed.    ? Homeopathic Products (ARNICA EX) Apply topically as needed. Sore areas    ? ketoconazole (NIZORAL)  2 % cream Apply 1 application topically daily. Apply to toe    ? Multiple Vitamin (MULTIVITAMIN) capsule Take 1 capsule by mouth daily.    ? polyethylene glycol (MIRALAX / GLYCOLAX) 17 g packet Take 17 g by mouth daily.    ? rosuvastatin (CRESTOR) 10 MG tablet Take 10 mg by mouth every other day.    ? triamcinolone (KENALOG) 0.1 % Apply 1 application topically 2 (two) times daily as needed.    ? Wheat Dextrin (BENEFIBER DRINK MIX PO) Take 1 Dose by mouth daily as needed.    ? ?No current facility-administered medications for this visit.  ? ? ?  Allergies  ?Allergen Reactions  ? Chlorthalidone Other (See Comments)  ?  Hyponatremia  ? Other   ?  Other reaction(s): Respiratory Distress  ? Amlodipine   ?  "shivers"   ? Ciprofloxacin   ? Dog Epithelium Allergy Skin Test   ? Latex Other (See Comments)  ?  Irritates her skin  ? Statins   ?  Unable to tolerate statins w the exception of crestor.  ? Sulfa Antibiotics Other (See Comments)  ?  "didnt feel good"  ? Sulfamethoxazole Other (See Comments)  ?  Makes her feel bad  ? Zetia [Ezetimibe] Other (See Comments)  ?  "makes me constipated"  ? Penicillins Rash  ? ? ?Past Medical History:  ?Diagnosis Date  ? Asthma   ? CAD (coronary artery disease)   ? last cath 08/2006  ? Carotid artery disease ([place])   ? moderate left ICA stenosis by 2.6 ultrasound  ? DJD (degenerative joint disease), lumbar   ? Hearing loss   ? HTN (hypertension)   ? Hyperlipidemia   ? Renal artery stenosis ([place]) 05/05/2021  ? Vision loss of right eye   ? ? ?There were no vitals taken for this visit. ? ?Essential hypertension ?Patient with essential hypertension, now wanting off irbesartan.  Reviewed the information with patient, she has now tried 3 different ARBs without success.  Does not recall having ever taken lisinopril.  Will start lisinopril 40 mg once daily.  She will need to repeat BMET in 2 weeks and is scheduled to follow up with Dr. Oval Linsey in May.   ? ? ?Tommy Medal PharmD CPP [facility] ?[place] ?[address] ?[place], [place] [address] ?[phone] ?

## 2021-09-16 ENCOUNTER — Encounter: Payer: Self-pay | Admitting: Pharmacist Clinician (PhC)/ Clinical Pharmacy Specialist

## 2021-09-16 DIAGNOSIS — H401233 Low-tension glaucoma, bilateral, severe stage: Secondary | ICD-10-CM | POA: Diagnosis not present

## 2021-09-16 DIAGNOSIS — H353131 Nonexudative age-related macular degeneration, bilateral, early dry stage: Secondary | ICD-10-CM | POA: Diagnosis not present

## 2021-09-16 NOTE — Assessment & Plan Note (Signed)
Patient with essential hypertension, now wanting off irbesartan.  Reviewed the information with patient, she has now tried 3 different ARBs without success.  Does not recall having ever taken lisinopril.  Will start lisinopril 40 mg once daily.  She will need to repeat BMET in 2 weeks and is scheduled to follow up with Dr. Oval Linsey in May.   ?

## 2021-09-29 ENCOUNTER — Encounter (HOSPITAL_BASED_OUTPATIENT_CLINIC_OR_DEPARTMENT_OTHER): Payer: Medicare Other | Attending: General Surgery | Admitting: General Surgery

## 2021-09-29 DIAGNOSIS — L97822 Non-pressure chronic ulcer of other part of left lower leg with fat layer exposed: Secondary | ICD-10-CM | POA: Diagnosis not present

## 2021-09-29 DIAGNOSIS — I2510 Atherosclerotic heart disease of native coronary artery without angina pectoris: Secondary | ICD-10-CM | POA: Insufficient documentation

## 2021-09-29 DIAGNOSIS — I872 Venous insufficiency (chronic) (peripheral): Secondary | ICD-10-CM | POA: Diagnosis not present

## 2021-09-29 DIAGNOSIS — I8393 Asymptomatic varicose veins of bilateral lower extremities: Secondary | ICD-10-CM | POA: Diagnosis not present

## 2021-09-29 NOTE — Progress Notes (Signed)
Kendra [name], [name] ([id number]) ?Visit Report for 09/29/2021 ?Allergy List Details ?Patient Name: Date of Service: ?Kendra [name], [name] 09/29/2021 1:30 PM ?Medical Record Number: [id number] ?Patient Account Number: [id number] ?Date of Birth/Sex: Treating RN: ?1936/04/13 (86 y.o. [name], [name] ?Primary Care Jakayden Cancio: [name] Other Clinician: ?Referring Saragrace Selke: ?Treating Titiana Severa/Extender: [name] ?[name] ?Weeks in Treatment: 0 ?Allergies ?Active Allergies ?latex ?Statins- ?Sulfa (Sulfonamide Antibiotics) ?Zetia ?penicillin ?chlorthalidone ?ciprofloxacin ?Severity: Moderate ?amlodipine ?Allergy Notes ?Electronic Signature(s) ?Signed: 09/29/2021 5:29:07 PM By: Levan Hurst RN, BSN ?Entered By: Levan Hurst on 09/29/2021 13:59:56 ?-------------------------------------------------------------------------------- ?Arrival Information Details ?Patient Name: Date of Service: ?Kendra [name], [name] 09/29/2021 1:30 PM ?Medical Record Number: [id number] ?Patient Account Number: [id number] ?Date of Birth/Sex: Treating RN: ?08-Dec-1935 (86 y.o. [name], [name] ?Primary Care Joe Gee: [name] Other Clinician: ?Referring Ras Kollman: ?Treating Adynn Caseres/Extender: [name] ?[name] ?Weeks in Treatment: 0 ?Visit Information ?Patient Arrived: Ambulatory ?Arrival Time: 13:46 ?Accompanied By: self ?Transfer Assistance: None ?Patient Identification Verified: Yes ?Secondary Verification Process Completed: Yes ?Electronic Signature(s) ?Signed: 09/29/2021 2:08:13 PM By: Sandre Kitty ?Entered By: Rudean Curt ?History Since Last Visit ?Added or deleted any medications: No ?Any new allergies or adverse reactions: No ?Had a fall or experienced change in activities of daily living that may affect risk of falls: No ?Signs or symptoms of abuse/neglect since last visito No ?Hospitalized since last visit: No ?Has Dressing in Place as Prescribed: Yes ?Pain Present Now: No ?, Destiny on 09/29/2021  13:48:03 ?-------------------------------------------------------------------------------- ?Clinic Level of Care Assessment Details ?Patient Name: Date of Service: ?Kendra [name], [name] 09/29/2021 1:30 PM ?Medical Record Number: [id number] ?Patient Account Number: [id number] ?Date of Birth/Sex: Treating RN: ?12/01/1935 (86 y.o. [name], [name] ?Primary Care Alvin Rubano: [name] Other Clinician: ?Referring Furman Trentman: ?Treating Amrita Radu/Extender: [name] ?[name] ?Weeks in Treatment: 0 ?Clinic Level of Care Assessment Items ?TOOL 1 Quantity Score ?X- 1 0 ?Use when EandM and Procedure is performed on INITIAL visit ?ASSESSMENTS - Nursing Assessment / Reassessment ?X- 1 20 ?General Physical Exam (combine w/ comprehensive assessment (listed just below) when performed on new pt. evals) ?X- 1 25 ?Comprehensive Assessment (HX, ROS, Risk Assessments, Wounds Hx, etc.) ?ASSESSMENTS - Wound and Skin Assessment / Reassessment ?'[]'$  - 0 ?Dermatologic / Skin Assessment (not related to wound area) ?ASSESSMENTS - Ostomy and/or Continence Assessment and Care ?'[]'$  - 0 ?Incontinence Assessment and Management ?'[]'$  - 0 ?Ostomy Care Assessment and Management (repouching, etc.) ?PROCESS - Coordination of Care ?X - Simple Patient / Family Education for ongoing care 1 15 ?'[]'$  - 0 ?Complex (extensive) Patient / Family Education for ongoing care ?X- 1 10 ?Staff obtains Consents, Records, T Results / Process Orders ?est ?'[]'$  - 0 ?Staff telephones HHA, Nursing Homes / Clarify orders / etc ?'[]'$  - 0 ?Routine Transfer to another Facility (non-emergent condition) ?'[]'$  - 0 ?Routine Hospital Admission (non-emergent condition) ?X- 1 15 ?New Admissions / Biomedical engineer / Ordering NPWT Apligraf, etc. ?, ?'[]'$  - 0 ?Emergency Hospital Admission (emergent condition) ?PROCESS - Special Needs ?'[]'$  - 0 ?Pediatric / Minor Patient Management ?'[]'$  - 0 ?Isolation Patient Management ?'[]'$  - 0 ?Hearing / Language / Visual special needs ?'[]'$  - 0 ?Assessment of  Community assistance (transportation, D/C planning, etc.) ?'[]'$  - 0 ?Additional assistance / Altered mentation ?'[]'$  - 0 ?Support Surface(s) Assessment (bed, cushion, seat, etc.) ?INTERVENTIONS - Miscellaneous ?'[]'$  - 0 ?External ear exam ?'[]'$  - 0 ?Patient Transfer (multiple staff / Civil Service fast streamer / Similar devices) ?'[]'$  - 0 ?Simple Staple / Suture removal (25 or less) ?'[]'$  -  0 ?Complex Staple / Suture removal (26 or more) ?'[]'$  - 0 ?Hypo/Hyperglycemic Management (do not check if billed separately) ?X- 1 15 ?Ankle / Brachial Index (ABI) - do not check if billed separately ?Has the patient been seen at the hospital within the last three years: Yes ?Total Score: 100 ?Level Of Care: New/Established - Level 3 ?Electronic Signature(s) ?Signed: 09/29/2021 5:29:07 PM By: Levan Hurst RN, BSN ?Entered By: Levan Hurst on 09/29/2021 17:03:09 ?-------------------------------------------------------------------------------- ?Encounter Discharge Information Details ?Patient Name: Date of Service: ?Kendra [name], [name] 09/29/2021 1:30 PM ?Medical Record Number: [id number] ?Patient Account Number: [id number] ?Date of Birth/Sex: Treating RN: ?[birth year] (86 y.o. [name], [name] ?Primary Care Rhema Boyett: [name] Other Clinician: ?Referring Brunetta Newingham: ?Treating Sharlena Kristensen/Extender: [name] ?[name] ?Weeks in Treatment: 0 ?Encounter Discharge Information Items Post Procedure Vitals ?Discharge Condition: Stable ?Temperature (F): 97.5 ?Ambulatory Status: Ambulatory ?Pulse (bpm): 51 ?Discharge Destination: Home ?Respiratory Rate (breaths/min): 16 ?Transportation: Private Auto ?Blood Pressure (mmHg): 198/76 ?Accompanied By: alone ?Schedule Follow-up Appointment: Yes ?Clinical Summary of Care: Patient Declined ?Electronic Signature(s) ?Signed: 09/29/2021 5:29:07 PM By: Levan Hurst RN, BSN ?Entered By: Levan Hurst on 09/29/2021 17:09:14 ?-------------------------------------------------------------------------------- ?Lower Extremity  Assessment Details ?Patient Name: Date of Service: ?Kendra [name], [name] 09/29/2021 1:30 PM ?Medical Record Number: [id number] ?Patient Account Number: [id number] ?Date of Birth/Sex: Treating RN: ?May 19, 1936 (86 y.o. [name], [name] ?Primary Care Kaysia Willard: [name] Other Clinician: ?Referring Lyvonne Cassell: ?Treating Muhsin Doris/Extender: [name] ?[name] ?Weeks in Treatment: 0 ?Edema Assessment ?Assessed: [Left: No] [Right: No] ?Edema: [Left: Ye] [Right: s] ?Calf ?Left: Right: ?Point of Measurement: 29 cm From Medial Instep 29.5 cm ?Ankle ?Left: Right: ?Point of Measurement: 9 cm From Medial Instep 19.6 cm ?Vascular Assessment ?Pulses: ?Dorsalis Pedis ?Palpable: [Left:Yes] ?Blood Pressure: ?Brachial: [Left:198] ?Ankle: ?[Left:Dorsalis Pedis: 172 0.87] ?Electronic Signature(s) ?Signed: 09/29/2021 5:29:07 PM By: Levan Hurst RN, BSN ?Entered By: Levan Hurst on 09/29/2021 14:16:21 ?-------------------------------------------------------------------------------- ?Multi Wound Chart Details ?Patient Name: ?Date of Service: ?Kendra [name], [name] 09/29/2021 1:30 PM ?Medical Record Number: [id number] ?Patient Account Number: [id number] ?Date of Birth/Sex: ?Treating RN: ?Jan 10, 1936 (86 y.o. [name], [name] ?Primary Care Nykia Turko: [name] ?Other Clinician: ?Referring Tashauna Caisse: ?Treating Orvel Cutsforth/Extender: [name] ?[name] ?Weeks in Treatment: 0 ?Vital Signs ?Height(in): 65 ?Pulse(bpm): 51 ?Weight(lbs): 116 ?Blood Pressure(mmHg): 198/76 ?Body Mass Index(BMI): 19.3 ?Temperature(??F): 97.5 ?Respiratory Rate(breaths/min): 16 ?Photos: [N/A:N/A] ?Left, Medial Lower Leg N/A N/A ?Wound Location: ?Trauma N/A N/A ?Wounding Event: ?Venous Leg Ulcer N/A N/A ?Primary Etiology: ?Asthma, Coronary Artery Disease, N/A N/A ?Comorbid History: ?Hypertension, Myocardial Infarction ?08/18/2021 N/A N/A ?Date Acquired: ?0 N/A N/A ?Weeks of Treatment: ?Open N/A N/A ?Wound Status: ?No N/A N/A ?Wound Recurrence: ?0.3x0.3x0.1  N/A N/A ?Measurements L x W x D (cm) ?0.071 N/A N/A ?A (cm?) : ?rea ?0.007 N/A N/A ?Volume (cm?) : ?0.00% N/A N/A ?% Reduction in Area: ?0.00% N/A N/A ?% Reduction in Volume: ?Full Thickness Without Exposed N/A N/A ?Clas

## 2021-09-29 NOTE — Progress Notes (Signed)
Vitelli, Darlisha E. ([id number]) ?Visit Report for 09/29/2021 ?Abuse Risk Screen Details ?Patient Name: Date of Service: ?[name], [name] 09/29/2021 1:30 PM ?Medical Record Number: [id number] ?Patient Account Number: [id number] ?Date of Birth/Sex: Treating RN: ?1936-01-06 (86 y.o. Kendra [name], Kendra [name] ?Primary Care Rindy Kollman: [name] Other Clinician: ?Referring Asher Torpey: ?Treating Hameed Kolar/Extender: [name] ?[name] ?Weeks in Treatment: 0 ?Abuse Risk Screen Items ?Answer ?ABUSE RISK SCREEN: ?Has anyone close to you tried to hurt or harm you recentlyo No ?Do you feel uncomfortable with anyone in your familyo No ?Has anyone forced you do things that you didnt want to doo No ?Electronic Signature(s) ?Signed: 09/29/2021 5:29:07 PM By: Levan Hurst RN, BSN ?Entered By: Levan Hurst on 09/29/2021 14:03:51 ?-------------------------------------------------------------------------------- ?Activities of Daily Living Details ?Patient Name: Date of Service: ?[name], [name] 09/29/2021 1:30 PM ?Medical Record Number: [id number] ?Patient Account Number: [id number] ?Date of Birth/Sex: Treating RN: ?06-01-1936 (86 y.o. Kendra [name], Kendra [name] ?Primary Care Tempestt Silba: [name] Other Clinician: ?Referring Yanuel Tagg: ?Treating Karmina Zufall/Extender: [name] ?[name] ?Weeks in Treatment: 0 ?Activities of Daily Living Items ?Answer ?Activities of Daily Living (Please select one for each item) ?[name] ?T Medications ?ake Completely Able ?Use T elephone Completely Able ?Care for Appearance Completely Able ?Use T oilet Completely Able ?Bath / Shower Completely Able ?Dress Self Completely Able ?Feed Self Completely Able ?Walk Completely Able ?Get In / Out Bed Completely Able ?Housework Completely Able ?Prepare Meals Completely Able ?Handle Money Completely Able ?Shop for Self Completely Able ?Electronic Signature(s) ?Signed: 09/29/2021 5:29:07 PM By: Levan Hurst RN, BSN ?Entered By: Levan Hurst on 09/29/2021 14:04:22 ?-------------------------------------------------------------------------------- ?Education Screening Details ?Patient Name: ?Date of Service: ?[name], [name] 09/29/2021 1:30 PM ?Medical Record Number: [id number] ?Patient Account Number: [id number] ?Date of Birth/Sex: ?Treating RN: ?January 03, 1936 (86 y.o. Kendra [name], Kendra [name] ?Primary Care Tyson Masin: [name] ?Other Clinician: ?Referring Kendra Zhao: ?Treating Broly Hatfield/Extender: [name] ?[name] ?Weeks in Treatment: 0 ?Primary Learner Assessed: Patient ?Learning Preferences/Education Level/Primary Language ?Learning Preference: Explanation, Demonstration, Printed Material ?Highest Education Level: College or Above ?Preferred Language: English ?Cognitive Barrier ?Language Barrier: No ?Translator Needed: No ?Memory Deficit: No ?Emotional Barrier: No ?Cultural/Religious Beliefs Affecting Medical Care: No ?Physical Barrier ?Impaired Vision: No ?Impaired Hearing: No ?Decreased Hand dexterity: No ?Knowledge/Comprehension ?Knowledge Level: High ?Comprehension Level: High ?Ability to understand written instructions: High ?Ability to understand verbal instructions: High ?Motivation ?Anxiety Level: Calm ?Cooperation: Cooperative ?Education Importance: Acknowledges Need ?Interest in Health Problems: Asks Questions ?Perception: Coherent ?Willingness to Engage in Self-Management High ?Activities: ?Readiness to Engage in Self-Management High ?Activities: ?Electronic Signature(s) ?Signed: 09/29/2021 5:29:07 PM By: Levan Hurst RN, BSN ?Entered By: Levan Hurst on 09/29/2021 14:04:47 ?-------------------------------------------------------------------------------- ?Fall Risk Assessment Details ?Patient Name: ?Date of Service: ?[name], [name] 09/29/2021 1:30 PM ?Medical Record Number: [id number] ?Patient Account Number: [id number] ?Date of Birth/Sex: ?Treating RN: ?07/03/1935 (86 y.o. Kendra [name], Kendra [name] ?Primary Care Keijuan Schellhase: [name] ?Other Clinician: ?Referring Euclid Cassetta: ?Treating Lamoyne Hessel/Extender: [name] ?[name] ?Weeks in Treatment: 0 ?Fall Risk Assessment Items ?Have you had 2 or more falls in the last 12 monthso 0 No ?Have you had any fall that resulted in injury in the last 12 monthso 0 No ?FALLS RISK SCREEN ?History of falling - immediate or within 3 months 0 No ?Secondary diagnosis (Do you have 2 or more medical diagnoseso) 15 Yes ?Ambulatory aid ?None/bed rest/wheelchair/nurse 0 Yes ?Crutches/cane/walker 0 No ?Furniture 0 No ?Intravenous therapy Access/Saline/Heparin Lock 0 No ?Gait/Transferring ?Normal/ bed rest/ wheelchair 0 Yes ?Weak (short steps  with or without shuffle, stooped but able to lift head while walking, may seek 0 No ?support from furniture) ?Impaired (short steps with shuffle, may have difficulty arising from chair, head down, impaired 0 No ?balance) ?Mental Status ?Oriented to own ability 0 Yes ?Electronic Signature(s) ?Signed: 09/29/2021 5:29:07 PM By: Levan Hurst RN, BSN ?Entered By: Levan Hurst on 09/29/2021 14:05:15 ?-------------------------------------------------------------------------------- ?Foot Assessment Details ?Patient Name: ?Date of Service: ?[name], [name] 09/29/2021 1:30 PM ?Medical Record Number: [id number] ?Patient Account Number: [id number] ?Date of Birth/Sex: ?Treating RN: ?15-May-1936 (86 y.o. Kendra [name], Kendra [name] ?Primary Care Breda Bond: [name] ?Other Clinician: ?Referring Evadna Donaghy: ?Treating Kaylene Dawn/Extender: [name] ?[name] ?Weeks in Treatment: 0 ?Foot Assessment Items ?Site Locations ?+ = Sensation present, - = Sensation absent, C = Callus, U = Ulcer ?R = Redness, W = Warmth, M = Maceration, PU = Pre-ulcerative lesion ?F = Fissure, S = Swelling, D = Dryness ?Assessment ?Right: Left: ?Other Deformity: No No ?Prior Foot Ulcer: No No ?Prior Amputation: No No ?Charcot Joint: No No ?Ambulatory Status: Ambulatory Without Help ?Gait: Steady ?Electronic  Signature(s) ?Signed: 09/29/2021 5:29:07 PM By: Levan Hurst RN, BSN ?Entered By: Levan Hurst on 09/29/2021 14:17:21 ?-------------------------------------------------------------------------------- ?Nutrition Risk Screening Details ?Patient Name: ?Date of Service: ?[name], [name] 09/29/2021 1:30 PM ?Medical Record Number: [id number] ?Patient Account Number: [id number] ?Date of Birth/Sex: ?Treating RN: ?28-Aug-1935 (86 y.o. Kendra [name], Kendra [name] ?Primary Care Juanantonio Stolar: [name] ?Other Clinician: ?Referring Miabella Shannahan: ?Treating Lanetta Figuero/Extender: [name] ?[name] ?Weeks in Treatment: 0 ?Height (in): 65 ?Weight (lbs): 116 ?Body Mass Index (BMI): 19.3 ?Nutrition Risk Screening Items ?Score Screening ?NUTRITION RISK SCREEN: ?I have an illness or condition that made me change the kind and/or amount of food I eat 0 No ?I eat fewer than two meals per day 0 No ?I eat few fruits and vegetables, or milk products 0 No ?I have three or more drinks of beer, liquor or wine almost every day 0 No ?I have tooth or mouth problems that make it hard for me to eat 0 No ?I don't always have enough money to buy the food I need 0 No ?I eat alone most of the time 0 No ?I take three or more different prescribed or over-the-counter drugs a day 1 Yes ?Without wanting to, I have lost or gained 10 pounds in the last six months 0 No ?I am not always physically able to shop, cook and/or feed myself 0 No ?Nutrition Protocols ?Good Risk Protocol ?Moderate Risk Protocol 0 Provide education on nutrition ?High Risk Proctocol ?Risk Level: Good Risk ?Score: 1 ?Electronic Signature(s) ?Signed: 09/29/2021 5:29:07 PM By: Levan Hurst RN, BSN ?Entered By: Levan Hurst on 09/29/2021 14:05:24 ?

## 2021-09-30 NOTE — Progress Notes (Signed)
[name], [name] ([id number]) ?Visit Report for 09/29/2021 ?Chief Complaint Document Details ?Patient Name: Date of Service: ?[name], [name] 09/29/2021 1:30 PM ?Medical Record Number: [id number] ?Patient Account Number: [id number] ?Date of Birth/Sex: Treating RN: ?07-01-1935 (86 y.o. Kendra [name], Kendra [name] ?Primary Care Provider: Yaakov [name] Other Clinician: ?Referring Provider: ?Treating Provider/Extender: Kendra [name] ?Kendra [name] ?Weeks in Treatment: 0 ?Information Obtained from: Patient ?Chief Complaint ?Patient seen for complaints of Non-Healing Wound on her left anterior lower leg. ?Electronic Signature(s) ?Signed: 09/29/2021 2:32:52 PM By: Kendra Maudlin MD FACS ?Entered By: Kendra [name] on 09/29/2021 14:32:52 ?-------------------------------------------------------------------------------- ?Debridement Details ?Patient Name: Date of Service: ?[name], [name] 09/29/2021 1:30 PM ?Medical Record Number: [id number] ?Patient Account Number: [id number] ?Date of Birth/Sex: Treating RN: ?1935-10-06 (86 y.o. Kendra [name], Kendra [name] ?Primary Care Provider: Yaakov [name] Other Clinician: ?Referring Provider: ?Treating Provider/Extender: Kendra [name] ?Kendra [name] ?Weeks in Treatment: 0 ?Debridement Performed for Assessment: Wound #2 Left,Medial Lower Leg ?Performed By: Physician Kendra Maudlin, MD ?Debridement Type: Debridement ?Severity of Tissue Pre Debridement: Fat layer exposed ?Level of Consciousness (Pre-procedure): Awake and Alert ?Pre-procedure Verification/Time Out Yes - 14:24 ?Taken: ?Start Time: 14:24 ?T Area Debrided (L x W): ?otal 0.3 (cm) x 0.3 (cm) = 0.09 (cm?) ?Tissue and other material debrided: Non-Viable, Eschar ?Level: Non-Viable Tissue ?Debridement Description: Selective/Open Wound ?Instrument: Curette ?Bleeding: Minimum ?Hemostasis Achieved: Pressure ?End Time: 14:25 ?Procedural Pain: 0 ?Post Procedural Pain: 0 ?Response to Treatment: Procedure was tolerated well ?Level of Consciousness  (Post- Awake and Alert ?procedure): ?Post Debridement Measurements of Total Wound ?Length: (cm) 0.3 ?Width: (cm) 0.3 ?Depth: (cm) 0.1 ?Volume: (cm?) 0.007 ?Character of Wound/Ulcer Post Debridement: Improved ?Severity of Tissue Post Debridement: Fat layer exposed ?Post Procedure Diagnosis ?Same as Pre-procedure ?Electronic Signature(s) ?Signed: 09/29/2021 3:54:45 PM By: Kendra Maudlin MD FACS ?Signed: 09/29/2021 5:29:07 PM By: Kendra Hurst RN, BSN ?Entered By: Kendra [name] on 09/29/2021 14:27:41 ?-------------------------------------------------------------------------------- ?HPI Details ?Patient Name: Date of Service: ?[name], [name] 09/29/2021 1:30 PM ?Medical Record Number: [id number] ?Patient Account Number: [id number] ?Date of Birth/Sex: Treating RN: ?[birth year] (86 y.o. Kendra [name], Kendra [name] ?Primary Care Provider: Yaakov [name] Other Clinician: ?Referring Provider: ?Treating Provider/Extender: Kendra [name] ?Kendra [name] ?Weeks in Treatment: 0 ?History of Present Illness ?HPI Description: ADMISSION ?11/14/2018 ?This is an 86 year old independent woman who apparently hit her right anterior leg on the bathtub on May 1. This initially I think was a hematoma that opened ?by description. She was seen apparently by her primary doctor and given Bactroban this made things worse but subsequently she was given doxycycline ?which improved the redness around the wound. She was seen in the ER/urgent care on 11/08/2018 when the redness became somewhat worse. She was given ?Keflex that she is completing currently. As CandS was apparently done that was negative although we do not have this result. She has 10 to 15 mm compression ?stockings that she wears. ?Past medical history; right carotid stenosis, hypertension, osteoarthritis, hyperlipidemia, coronary artery disease and asthma ?ABI in our clinic was 0.94 on the right ?6/15; wound itself is come down in size. Using silver collagen. ?6/19; comes back early today secondary  to some home repairs she might of been having next week. She has a traumatic wound on the right lateral calf which ?I debrided earlier this week everything looks quite good here. We have been using silver collagen under compression. ?6/26; slight degree of hyper granulation and a smaller wound. Chemical cautery with silver nitrate. I changed the primary dressing to [facility]. This ?should be closed by  next week ?7/10; the wound is closed. There is no open area here. She states she is still having some tenderness ?READMISSION ?09/29/2021 ?This is an 86 year old woman who was previously seen in our clinic for a nonhealing wound on her right lower extremity. She reports that about 6 to 8 weeks ?ago, she hit her leg on the door to her dishwasher. She has been cleaning the wound with isopropyl alcohol and then applying triple antibiotic ointment and a ?Band-Aid. She felt like the wound was not healing well and due to her prior experience in the wound care center, she self-referred for further evaluation and ?management. ABI in clinic today was 0.87. She is not diabetic and does not smoke. ?Electronic Signature(s) ?Signed: 09/29/2021 2:34:33 PM By: Kendra Maudlin MD FACS ?Entered By: Kendra [name] on 09/29/2021 14:34:32 ?-------------------------------------------------------------------------------- ?Physical Exam Details ?Patient Name: Date of Service: ?[name], [name] 09/29/2021 1:30 PM ?Medical Record Number: [id number] ?Patient Account Number: [id number] ?Date of Birth/Sex: Treating RN: ?Nov 04, 1935 (86 y.o. Kendra [name], Kendra [name] ?Primary Care Provider: Yaakov [name] Other Clinician: ?Referring Provider: ?Treating Provider/Extender: Kendra [name] ?Kendra [name] ?Weeks in Treatment: 0 ?Constitutional ?Hypertensive. Bradycardic, asymptomatic.. . . No acute distress. ?Respiratory ?Normal work of breathing on room air.[name] ?Cardiovascular ?Dorsalis pedis pulse is palpable on the left; I did not examine the  right.[name] ?Notes ?09/29/2021: On the anterior tibial surface of her left lower extremity, there is a small linear wound covered with eschar. No periwound erythema. No induration or ?drainage appreciated. ?Electronic Signature(s) ?Signed: 09/29/2021 2:36:12 PM By: Kendra Maudlin MD FACS ?Entered By: Kendra [name] on 09/29/2021 14:36:12 ?-------------------------------------------------------------------------------- ?Physician Orders Details ?Patient Name: Date of Service: ?[name], [name] 09/29/2021 1:30 PM ?Medical Record Number: [id number] ?Patient Account Number: [id number] ?Date of Birth/Sex: Treating RN: ?09-04-1935 (86 y.o. 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 - Dr. Celine Ahr - Room 2 - Tuesday 5/2 at 3:00 ?Return A ?Bathing/ Shower/ Hygiene ?May shower with protection but do not get wound dressing(s) wet. - Ok to use cast protector. ?Edema Control - Lymphedema / SCD / Other ?Elevate legs to the level of the heart or above for 30 minutes daily and/or when sitting, a frequency of: - throughout the day ?Avoid standing for long periods of time. ?Exercise regularly ?Wound Treatment ?Wound #2 - Lower Leg Wound Laterality: Left, Medial ?Cleanser: Soap and Water 1 x Per Week ?Discharge Instructions: May shower and wash wound with dial antibacterial soap and water prior to dressing change. ?Cleanser: Wound Cleanser 1 x Per Week ?Discharge Instructions: Cleanse the wound with wound cleanser prior to applying a clean  dressing using gauze sponges, not tissue or cotton balls. ?Peri-Wound Care: Sween Lotion (Moisturizing lotion) 1 x Per Week ?Discharge Instructions: Apply moisturizing lotion as directed ?Prim Dressing: KerraCel Ag Stormy Fabian

## 2021-10-02 ENCOUNTER — Encounter (HOSPITAL_BASED_OUTPATIENT_CLINIC_OR_DEPARTMENT_OTHER): Payer: Medicare Other | Admitting: General Surgery

## 2021-10-02 DIAGNOSIS — L97822 Non-pressure chronic ulcer of other part of left lower leg with fat layer exposed: Secondary | ICD-10-CM | POA: Diagnosis not present

## 2021-10-02 DIAGNOSIS — I8393 Asymptomatic varicose veins of bilateral lower extremities: Secondary | ICD-10-CM | POA: Diagnosis not present

## 2021-10-02 DIAGNOSIS — I2510 Atherosclerotic heart disease of native coronary artery without angina pectoris: Secondary | ICD-10-CM | POA: Diagnosis not present

## 2021-10-02 NOTE — Progress Notes (Signed)
[name], [name] ([id number]) ?Visit Report for 10/02/2021 ?Arrival Information Details ?Patient Name: Date of Service: ?[name], [name] 10/02/2021 2:00 PM ?Medical Record Number: [id number] ?Patient Account Number: [id number] ?Date of Birth/Sex: Treating RN: ?Oct 26, 1935 (86 y.o. F) Kendra [name] ?Primary Care Kendra [name]: Kendra [name] Other Clinician: ?Referring Kendra [name]: ?Treating Kendra [name]/Extender: Kendra [name] ?Kendra [name] ?Weeks in Treatment: 0 ?Visit Information History Since Last Visit ?Added or deleted any medications: No ?Patient Arrived: Ambulatory ?Any new allergies or adverse reactions: No ?Arrival Time: 14:28 ?Had a fall or experienced change in No ?Accompanied By: self ?activities of daily living that may affect ?Transfer Assistance: None ?risk of falls: ?Patient Identification Verified: Yes ?Signs or symptoms of abuse/neglect since last visito No ?Hospitalized since last visit: No ?Implantable device outside of the clinic excluding No ?cellular tissue based products placed in the center ?since last visit: ?Has Dressing in Place as Prescribed: Yes ?Has Compression in Place as Prescribed: Yes ?Pain Present Now: No ?Electronic Signature(s) ?Signed: 10/02/2021 5:21:13 PM By: Kendra Catholic RN ?Entered By: Kendra [name] on 10/02/2021 14:33:54 ?-------------------------------------------------------------------------------- ?Clinic Level of Care Assessment Details ?Patient Name: Date of Service: ?[name], [name] 10/02/2021 2:00 PM ?Medical Record Number: [id number] ?Patient Account Number: [id number] ?Date of Birth/Sex: Treating RN: ?[birth year] (86 y.o. F) Kendra [name] ?Primary Care Osamu Olguin: Kendra [name] Other Clinician: ?Referring Kendra [name]: ?Treating Drayton Tieu/Extender: Kendra [name] ?Kendra [name] ?Weeks in Treatment: 0 ?Clinic Level of Care Assessment Items ?TOOL 4 Quantity Score ?X- 1 0 ?Use when only an EandM is performed on FOLLOW-UP visit ?ASSESSMENTS - Nursing Assessment /  Reassessment ?X- 1 10 ?Reassessment of Co-morbidities (includes updates in patient status) ?X- 1 5 ?Reassessment of Adherence to Treatment Plan ?ASSESSMENTS - Wound and Skin A ssessment / Reassessment ?X - Simple Wound Assessment / Reassessment - one wound 1 5 ?'[]'$  - 0 ?Complex Wound Assessment / Reassessment - multiple wounds ?'[]'$  - 0 ?Dermatologic / Skin Assessment (not related to wound area) ?ASSESSMENTS - Focused Assessment ?'[]'$  - 0 ?Circumferential Edema Measurements - multi extremities ?'[]'$  - 0 ?Nutritional Assessment / Counseling / Intervention ?'[]'$  - 0 ?Lower Extremity Assessment (monofilament, tuning fork, pulses) ?'[]'$  - 0 ?Peripheral Arterial Disease Assessment (using hand held doppler) ?ASSESSMENTS - Ostomy and/or Continence Assessment and Care ?'[]'$  - 0 ?Incontinence Assessment and Management ?'[]'$  - 0 ?Ostomy Care Assessment and Management (repouching, etc.) ?PROCESS - Coordination of Care ?X - Simple Patient / Family Education for ongoing care 1 15 ?'[]'$  - 0 ?Complex (extensive) Patient / Family Education for ongoing care ?X- 1 10 ?Staff obtains Consents, Records, T Results / Process Orders ?est ?X- 1 10 ?Staff telephones HHA, Nursing Homes / Clarify orders / etc ?'[]'$  - 0 ?Routine Transfer to another Facility (non-emergent condition) ?'[]'$  - 0 ?Routine Hospital Admission (non-emergent condition) ?'[]'$  - 0 ?New Admissions / Biomedical engineer / Ordering NPWT Apligraf, etc. ?, ?'[]'$  - 0 ?Emergency Hospital Admission (emergent condition) ?X- 1 10 ?Simple Discharge Coordination ?'[]'$  - 0 ?Complex (extensive) Discharge Coordination ?PROCESS - Special Needs ?'[]'$  - 0 ?Pediatric / Minor Patient Management ?'[]'$  - 0 ?Isolation Patient Management ?'[]'$  - 0 ?Hearing / Language / Visual special needs ?'[]'$  - 0 ?Assessment of Community assistance (transportation, D/C planning, etc.) ?'[]'$  - 0 ?Additional assistance / Altered mentation ?'[]'$  - 0 ?Support Surface(s) Assessment (bed, cushion, seat, etc.) ?INTERVENTIONS - Wound Cleansing /  Measurement ?X - Simple Wound Cleansing - one wound 1 5 ?'[]'$  - 0 ?Complex Wound Cleansing - multiple wounds ?'[]'$  - 0 ?Wound Imaging (photographs - any  number of wounds) ?'[]'$  - 0 ?Wound Tracing (instead of photographs) ?'[]'$  - 0 ?Simple Wound Measurement - one wound ?'[]'$  - 0 ?Complex Wound Measurement - multiple wounds ?INTERVENTIONS - Wound Dressings ?X - Small Wound Dressing one or multiple wounds 1 10 ?'[]'$  - 0 ?Medium Wound Dressing one or multiple wounds ?'[]'$  - 0 ?Large Wound Dressing one or multiple wounds ?'[]'$  - 0 ?Application of Medications - topical ?'[]'$  - 0 ?Application of Medications - injection ?INTERVENTIONS - Miscellaneous ?'[]'$  - 0 ?External ear exam ?'[]'$  - 0 ?Specimen Collection (cultures, biopsies, blood, body fluids, etc.) ?'[]'$  - 0 ?Specimen(s) / Culture(s) sent or taken to Lab for analysis ?'[]'$  - 0 ?Patient Transfer (multiple staff / Civil Service fast streamer / Similar devices) ?'[]'$  - 0 ?Simple Staple / Suture removal (25 or less) ?'[]'$  - 0 ?Complex Staple / Suture removal (26 or more) ?'[]'$  - 0 ?Hypo / Hyperglycemic Management (close monitor of Blood Glucose) ?'[]'$  - 0 ?Ankle / Brachial Index (ABI) - do not check if billed separately ?X- 1 5 ?Vital Signs ?Has the patient been seen at the hospital within the last three years: Yes ?Total Score: 85 ?Level Of Care: New/Established - Level 3 ?Electronic Signature(s) ?Signed: 10/02/2021 5:21:13 PM By: Kendra Catholic RN ?Entered By: Kendra [name] on 10/02/2021 17:19:57 ?-------------------------------------------------------------------------------- ?Encounter Discharge Information Details ?Patient Name: Date of Service: ?[name], [name] 10/02/2021 2:00 PM ?Medical Record Number: [id number] ?Patient Account Number: [id number] ?Date of Birth/Sex: Treating RN: ?1935/10/25 (86 y.o. F) Kendra [name] ?Primary Care Sadae Arrazola: Kendra [name] Other Clinician: ?Referring Monay Houlton: ?Treating Asjia Berrios/Extender: Kendra [name] ?Kendra [name] ?Weeks in Treatment: 0 ?Encounter Discharge Information  Items ?Discharge Condition: Stable ?Ambulatory Status: Ambulatory ?Discharge Destination: Home ?Transportation: Private Auto ?Accompanied By: self ?Schedule Follow-up Appointment: Yes ?Clinical Summary of Care: Patient Declined ?Electronic Signature(s) ?Signed: 10/02/2021 5:21:13 PM By: Kendra Catholic RN ?Entered By: Kendra [name] on 10/02/2021 17:20:46 ?-------------------------------------------------------------------------------- ?Patient/Caregiver Education Details ?Patient Name: Date of Service: ?[name], [name] 4/27/2023andnbsp2:00 PM ?Medical Record Number: [id number] ?Patient Account Number: [id number] ?Date of Birth/Gender: Treating RN: ?1936/02/07 (86 y.o. F) Kendra [name] ?Primary Care Physician: Kendra [name] Other Clinician: ?Referring Physician: ?Treating Physician/Extender: Kendra [name] ?Kendra [name] ?Weeks in Treatment: 0 ?Education Assessment ?Education Provided To: ?Patient ?Education Topics Provided ?Wound/Skin Impairment: ?Methods: Explain/Verbal ?Responses: Return demonstration correctly ?Electronic Signature(s) ?Signed: 10/02/2021 5:21:13 PM By: Kendra Catholic RN ?Entered By: Kendra [name] on 10/02/2021 17:20:29 ?-------------------------------------------------------------------------------- ?Wound Assessment Details ?Patient Name: ?Date of Service: ?[name], [name] 10/02/2021 2:00 PM ?Medical Record Number: [id number] ?Patient Account Number: [id number] ?Date of Birth/Sex: ?Treating RN: ?1936/01/19 (86 y.o. 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

## 2021-10-03 NOTE — Progress Notes (Signed)
[name], [name] ([id number]) ?Visit Report for 10/02/2021 ?SuperBill Details ?Patient Name: Date of Service: ?[name], [name] 10/02/2021 ?Medical Record Number: [id number] ?Patient Account Number: [id number] ?Date of Birth/Sex: Treating RN: ?1935-10-29 (86 y.o. F) [name], [name] ?Primary Care Provider: [name] Other Clinician: ?Referring Provider: ?Treating Provider/Extender: [name] ?[name] ?Weeks in Treatment: 0 ?Diagnosis Coding ?ICD-10 Codes ?Code Description ?P77.939 Non-pressure chronic ulcer of other part of left lower leg with fat layer exposed ?I83.93 Asymptomatic varicose veins of bilateral lower extremities ?I25.10 Atherosclerotic heart disease of native coronary artery without angina pectoris ?Facility Procedures ?CPT4 Code Description Modifier Quantity ?[id number] 99213 - WOUND CARE VISIT-LEV 3 EST PT 1 ?Electronic Signature(s) ?Signed: 10/02/2021 5:41:56 PM By: Dellie Catholic RN ?Signed: 10/03/2021 7:48:00 AM By: [name] MD FACS ?Entered By: Dellie Catholic on 10/02/2021 17:40:41 ?

## 2021-10-05 DIAGNOSIS — R5383 Other fatigue: Secondary | ICD-10-CM | POA: Diagnosis not present

## 2021-10-05 DIAGNOSIS — Z03818 Encounter for observation for suspected exposure to other biological agents ruled out: Secondary | ICD-10-CM | POA: Diagnosis not present

## 2021-10-05 DIAGNOSIS — R051 Acute cough: Secondary | ICD-10-CM | POA: Diagnosis not present

## 2021-10-05 DIAGNOSIS — R197 Diarrhea, unspecified: Secondary | ICD-10-CM | POA: Diagnosis not present

## 2021-10-05 DIAGNOSIS — J029 Acute pharyngitis, unspecified: Secondary | ICD-10-CM | POA: Diagnosis not present

## 2021-10-05 DIAGNOSIS — B349 Viral infection, unspecified: Secondary | ICD-10-CM | POA: Diagnosis not present

## 2021-10-07 ENCOUNTER — Encounter (HOSPITAL_BASED_OUTPATIENT_CLINIC_OR_DEPARTMENT_OTHER): Payer: Medicare Other | Attending: General Surgery | Admitting: General Surgery

## 2021-10-07 DIAGNOSIS — I872 Venous insufficiency (chronic) (peripheral): Secondary | ICD-10-CM | POA: Diagnosis not present

## 2021-10-07 DIAGNOSIS — M1990 Unspecified osteoarthritis, unspecified site: Secondary | ICD-10-CM | POA: Insufficient documentation

## 2021-10-07 DIAGNOSIS — E785 Hyperlipidemia, unspecified: Secondary | ICD-10-CM | POA: Insufficient documentation

## 2021-10-07 DIAGNOSIS — L97822 Non-pressure chronic ulcer of other part of left lower leg with fat layer exposed: Secondary | ICD-10-CM | POA: Insufficient documentation

## 2021-10-07 DIAGNOSIS — I2510 Atherosclerotic heart disease of native coronary artery without angina pectoris: Secondary | ICD-10-CM | POA: Insufficient documentation

## 2021-10-07 DIAGNOSIS — I6521 Occlusion and stenosis of right carotid artery: Secondary | ICD-10-CM | POA: Diagnosis not present

## 2021-10-07 DIAGNOSIS — I10 Essential (primary) hypertension: Secondary | ICD-10-CM | POA: Diagnosis not present

## 2021-10-07 DIAGNOSIS — Z87891 Personal history of nicotine dependence: Secondary | ICD-10-CM | POA: Diagnosis not present

## 2021-10-07 DIAGNOSIS — I8393 Asymptomatic varicose veins of bilateral lower extremities: Secondary | ICD-10-CM | POA: Insufficient documentation

## 2021-10-07 DIAGNOSIS — I70248 Atherosclerosis of native arteries of left leg with ulceration of other part of lower left leg: Secondary | ICD-10-CM | POA: Diagnosis not present

## 2021-10-07 DIAGNOSIS — J45909 Unspecified asthma, uncomplicated: Secondary | ICD-10-CM | POA: Insufficient documentation

## 2021-10-07 NOTE — Progress Notes (Signed)
Bourque, Earma E. ([id number]) ?Visit Report for 10/07/2021 ?Chief Complaint Document Details ?Patient Name: Date of Service: ?Kendra REL, [name] 10/07/2021 3:00 PM ?Medical Record Number: [id number] ?Patient Account Number: [id number] ?Date of Birth/Sex: Treating RN: ?[birth year] (86 y.o. Kendra [name], Kendra [name] ?Primary Care Provider: [name] Other Clinician: ?Referring Provider: ?Treating Provider/Extender: [name] ?[name] ?Weeks in Treatment: 1 ?Information Obtained from: Patient ?Chief Complaint ?Patient seen for complaints of Non-Healing Wound on her left anterior lower leg. ?Electronic Signature(s) ?Signed: 10/07/2021 4:14:42 PM By: [name] MD FACS ?Entered By: [name] on 10/07/2021 16:14:42 ?-------------------------------------------------------------------------------- ?Debridement Details ?Patient Name: Date of Service: ?Kendra REL, [name] 10/07/2021 3:00 PM ?Medical Record Number: [id number] ?Patient Account Number: [id number] ?Date of Birth/Sex: Treating RN: ?[birth year] (86 y.o. Kendra [name], Kendra [name] ?Primary Care Provider: [name] Other Clinician: ?Referring Provider: ?Treating Provider/Extender: [name] ?[name] ?Weeks in Treatment: 1 ?Debridement Performed for Assessment: Wound #2 Left,Medial Lower Leg ?Performed By: Physician [name], MD ?Debridement Type: Debridement ?Severity of Tissue Pre Debridement: Fat layer exposed ?Level of Consciousness (Pre-procedure): Awake and Alert ?Pre-procedure Verification/Time Out Yes - 15:43 ?Taken: ?Start Time: 15:43 ?T Area Debrided (L x W): ?otal 0.2 (cm) x 0.2 (cm) = 0.04 (cm?) ?Tissue and other material debrided: Non-Viable, [place]char, [place], [place] ?Level: Non-Viable Tissue ?Debridement Description: Selective/Open Wound ?Instrument: Curette ?Bleeding: Minimum ?Hemostasis Achieved: Pressure ?End Time: 15:44 ?Procedural Pain: 0 ?Post Procedural Pain: 0 ?Response to Treatment: Procedure was tolerated well ?Level of  Consciousness (Post- Awake and Alert ?procedure): ?Post Debridement Measurements of Total Wound ?Length: (cm) 0.2 ?Width: (cm) 0.2 ?Depth: (cm) 0.1 ?Volume: (cm?) 0.003 ?Character of Wound/Ulcer Post Debridement: Improved ?Severity of Tissue Post Debridement: Fat layer exposed ?Post Procedure Diagnosis ?Same as Pre-procedure ?Electronic Signature(s) ?Signed: 10/07/2021 4:42:51 PM By: [name] MD FACS ?Signed: 10/07/2021 6:08:29 PM By: Levan Hurst RN, BSN ?Entered By: Levan Hurst on 10/07/2021 15:44:43 ?-------------------------------------------------------------------------------- ?HPI Details ?Patient Name: Date of Service: ?Kendra REL, [name] 10/07/2021 3:00 PM ?Medical Record Number: [id number] ?Patient Account Number: [id number] ?Date of Birth/Sex: Treating RN: ?06-26-1935 (86 y.o. Kendra [name], Kendra [name] ?Primary Care Provider: [name] Other Clinician: ?Referring Provider: ?Treating Provider/Extender: [name] ?[name] ?Weeks in Treatment: 1 ?History of Present Illness ?HPI Description: ADMISSION ?11/14/2018 ?This is an 86 year old independent woman who apparently hit her right anterior leg on the bathtub on May 1. This initially I think was a hematoma that opened ?by description. She was seen apparently by her primary doctor and given Bactroban this made things worse but subsequently she was given doxycycline ?which improved the redness around the wound. She was seen in the ER/urgent care on 11/08/2018 when the redness became somewhat worse. She was given ?Keflex that she is completing currently. As CandS was apparently done that was negative although we do not have this result. She has 10 to 15 mm compression ?stockings that she wears. ?Past medical history; right carotid stenosis, hypertension, osteoarthritis, hyperlipidemia, coronary artery disease and asthma ?ABI in our clinic was 0.94 on the right ?6/15; wound itself is come down in size. Using silver collagen. ?6/19; comes back early  today secondary to some home repairs she might of been having next week. She has a traumatic wound on the right lateral calf which ?I debrided earlier this week everything looks quite good here. We have been using silver collagen under compression. ?6/26; slight degree of hyper granulation and a smaller wound. Chemical cautery with silver nitrate. I changed the primary dressing to [facility]. This ?should be  closed by next week ?7/10; the wound is closed. There is no open area here. She states she is still having some tenderness ?READMISSION ?09/29/2021 ?This is an 86 year old woman who was previously seen in our clinic for a nonhealing wound on her right lower extremity. She reports that about 6 to 8 weeks ?ago, she hit her leg on the door to her dishwasher. She has been cleaning the wound with isopropyl alcohol and then applying triple antibiotic ointment and a ?Band-Aid. She felt like the wound was not healing well and due to her prior experience in the wound care center, she self-referred for further evaluation and ?management. ABI in clinic today was 0.87. She is not diabetic and does not smoke. ?10/07/2021: Apparently, since our last clinic visit, the patient has had 2 separate nurse visits to address her discomfort with her Kerlix and Coban compression ?wrap. She says that she is simply unable to tolerate it. She indicates that she does have compression stockings with 10-15 mmHg pressure and she wonders if ?she could just wear these instead. Her wound is quite small with just a bit of eschar and slough. ?Electronic Signature(s) ?Signed: 10/07/2021 4:15:55 PM By: [name] MD FACS ?Entered By: [name] on 10/07/2021 16:15:54 ?-------------------------------------------------------------------------------- ?Physical Exam Details ?Patient Name: Date of Service: ?Kendra REL, [name] 10/07/2021 3:00 PM ?Medical Record Number: [id number] ?Patient Account Number: [id number] ?Date of Birth/Sex: Treating  RN: ?16-Oct-1935 (86 y.o. Kendra [name], Kendra [name] ?Primary Care Provider: [name] Other Clinician: ?Referring Provider: ?Treating Provider/Extender: [name] ?[name] ?Weeks in Treatment: 1 ?Constitutional ?She is hypertensive, but asymptomatic.. Bradycardic, asymptomatic.. . . No acute distress. ?Respiratory ?Normal work of breathing on room air. ?Notes ?10/07/2021: The wound is quite small with just a little bit of eschar and slough. No significant edema. ?Electronic Signature(s) ?Signed: 10/07/2021 4:18:48 PM By: [name] MD FACS ?Entered By: [name] on 10/07/2021 16:18:48 ?-------------------------------------------------------------------------------- ?Physician Orders Details ?Patient Name: Date of Service: ?Kendra REL, Lavona E. 10/07/2021 3:00 PM ?Medical Record Number: [id number] ?Patient Account Number: [id number] ?Date of Birth/Sex: Treating RN: ?[birth year] (86 y.o. Kendra [name], Kendra [name] ?Primary Care Provider: [name] Other Clinician: ?Referring Provider: ?Treating Provider/Extender: [name] ?[name] ?Weeks in Treatment: 1 ?Verbal / Phone Orders: No ?Diagnosis Coding ?ICD-10 Coding ?Code Description ?D53.299 Non-pressure chronic ulcer of other part of left lower leg with fat layer exposed ?I83.93 Asymptomatic varicose veins of bilateral lower extremities ?I25.10 Atherosclerotic heart disease of native coronary artery without angina pectoris ?Follow-up Appointments ?ppointment in 1 week. - Dr. Celine Ahr - Room 3 - Monday 5/8 at 12:30 ?Return A ?Bathing/ Shower/ Hygiene ?May shower with protection but do not get wound dressing(s) wet. - Ok to use cast protector. ?Edema Control - Lymphedema / SCD / Other ?Elevate legs to the level of the heart or above for 30 minutes daily and/or when sitting, a frequency of: - throughout the day ?Avoid standing for long periods of time. ?Exercise regularly ?Compression stocking or Garment 20-30 mm/Hg pressure to: - both legs daily ?Wound  Treatment ?Wound #2 - Lower Leg Wound Laterality: Left, Medial ?Cleanser: Soap and Water Every Other Day ?Discharge Instructions: May shower and wash wound with dial antibacterial soap and water prior to dressing chan

## 2021-10-07 NOTE — Progress Notes (Signed)
Wisler, Terren E. ([id number]) ?Visit Report for 10/07/2021 ?Arrival Information Details ?Patient Name: Date of Service: ?Kendra [name], [name] 10/07/2021 3:00 PM ?Medical Record Number: [id number] ?Patient Account Number: [id number] ?Date of Birth/Sex: Treating RN: ?Feb 09, 1936 (86 y.o. Kendra [name] ?Primary Care Kendra [name] Other Clinician: ?Referring Therman Hughlett: ?Treating Kendra [name]/Extender: Kendra [name] ?Kendra [name] ?Weeks in Treatment: 1 ?Visit Information History Since Last Visit ?Added or deleted any medications: No ?Patient Arrived: Ambulatory ?Any new allergies or adverse reactions: No ?Arrival Time: 15:21 ?Had a fall or experienced change in No ?Accompanied By: alone ?activities of daily living that may affect ?Transfer Assistance: None ?risk of falls: ?Patient Identification Verified: Yes ?Signs or symptoms of abuse/neglect since last visito No ?Secondary Verification Process Completed: Yes ?Hospitalized since last visit: No ?Patient Requires Transmission-Based Precautions: No ?Implantable device outside of the clinic excluding No ?Patient Has Alerts: No ?cellular tissue based products placed in the center ?since last visit: ?Has Dressing in Place as Prescribed: Yes ?Has Compression in Place as Prescribed: Yes ?Pain Present Now: No ?Electronic Signature(s) ?Signed: 10/07/2021 6:08:29 PM By: Kendra Hurst RN, BSN ?Entered By: Kendra [name] on 10/07/2021 15:21:18 ?-------------------------------------------------------------------------------- ?Encounter Discharge Information Details ?Patient Name: Date of Service: ?Kendra [name], [name] 10/07/2021 3:00 PM ?Medical Record Number: [id number] ?Patient Account Number: [id number] ?Date of Birth/Sex: Treating RN: ?04-Sep-1935 (86 y.o. Kendra [name] ?Primary Care Kendra [name]: Kendra [name] Other Clinician: ?Referring Kendra [name]: ?Treating Kendra [name]/Extender: Kendra [name] ?Kendra [name] ?Weeks in Treatment: 1 ?Encounter Discharge Information Items Post Procedure  Vitals ?Discharge Condition: Stable ?Temperature (F): 97.5 ?Ambulatory Status: Ambulatory ?Pulse (bpm): 53 ?Discharge Destination: Home ?Respiratory Rate (breaths/min): 16 ?Transportation: Private Auto ?Blood Pressure (mmHg): 158/67 ?Accompanied By: alone ?Schedule Follow-up Appointment: Yes ?Clinical Summary of Care: Patient Declined ?Electronic Signature(s) ?Signed: 10/07/2021 6:08:29 PM By: Kendra Hurst RN, BSN ?Entered By: Kendra [name] on 10/07/2021 17:12:39 ?-------------------------------------------------------------------------------- ?Lower Extremity Assessment Details ?Patient Name: ?Date of Service: ?Kendra [name], [name] 10/07/2021 3:00 PM ?Medical Record Number: [id number] ?Patient Account Number: [id number] ?Date of Birth/Sex: ?Treating RN: ?[birth year] (86 y.o. Kendra [name] ?Primary Care Kendra [name]: Kendra [name] ?Other Clinician: ?Referring Kendra [name]: ?Treating Kendra [name]/Extender: Kendra [name] ?Kendra [name] ?Weeks in Treatment: 1 ?Edema Assessment ?Assessed: [Left: No] [Right: No] ?Edema: [Left: Ye] [Right: s] ?Calf ?Left: Right: ?Point of Measurement: 29 cm From Medial Instep 28 cm ?Ankle ?Left: Right: ?Point of Measurement: 9 cm From Medial Instep 19 cm ?Vascular Assessment ?Pulses: ?Dorsalis Pedis ?Palpable: [Left:Yes] ?Electronic Signature(s) ?Signed: 10/07/2021 6:08:29 PM By: Kendra Hurst RN, BSN ?Entered By: Kendra [name] on 10/07/2021 15:28:47 ?-------------------------------------------------------------------------------- ?Multi Wound Chart Details ?Patient Name: ?Date of Service: ?Kendra [name], [name] 10/07/2021 3:00 PM ?Medical Record Number: [id number] ?Patient Account Number: [id number] ?Date of Birth/Sex: ?Treating RN: ?02-22-1936 (86 y.o. Kendra [name] ?Primary Care Mykelle Cockerell: Kendra [name] ?Other Clinician: ?Referring Kendra [name]: ?Treating Tamanika Heiney/Extender: Kendra [name] ?Kendra [name] ?Weeks in Treatment: 1 ?Vital Signs ?Height(in): 65 ?Pulse(bpm): 53 ?Weight(lbs): 116 ?Blood  Pressure(mmHg): 158/67 ?Body Mass Index(BMI): 19.3 ?Temperature(??F): 97.5 ?Respiratory Rate(breaths/min): 16 ?Photos: [N/A:N/A] ?Left, Medial Lower Leg N/A N/A ?Wound Location: ?Trauma N/A N/A ?Wounding Event: ?Venous Leg Ulcer N/A N/A ?Primary Etiology: ?Asthma, Coronary Artery Disease, N/A N/A ?Comorbid History: ?Hypertension, Myocardial Infarction ?08/18/2021 N/A N/A ?Date Acquired: ?1 N/A N/A ?Weeks of Treatment: ?Open N/A N/A ?Wound Status: ?No N/A N/A ?Wound Recurrence: ?0.2x0.2x0.1 N/A N/A ?Measurements L x W x D (cm) ?0.031 N/A N/A ?A (cm?) : ?rea ?0.003 N/A N/A ?Volume (cm?) : ?56.30% N/A N/A ?% Reduction in A rea: ?57.10% N/A N/A ?%  Reduction in Volume: ?Full Thickness Without Exposed N/A N/A ?Classification: ?Support Structures ?Medium N/A N/A ?Exudate A mount: ?Serosanguineous N/A N/A ?Exudate Type: ?red, brown N/A N/A ?Exudate Color: ?Flat and Intact N/A N/A ?Wound Margin: ?Large (67-100%) N/A N/A ?Granulation A mount: ?Red, Pink N/A N/A ?Granulation Quality: ?Small (1-33%) N/A N/A ?Necrotic A mount: ?Eschar N/A N/A ?Necrotic Tissue: ?Fat Layer (Subcutaneous Tissue): Yes N/A N/A ?Exposed Structures: ?Fascia: No ?Tendon: No ?Muscle: No ?Joint: No ?Bone: No ?Medium (34-66%) N/A N/A ?Epithelialization: ?Debridement - Selective/Open Wound N/A N/A ?Debridement: ?Pre-procedure Verification/Time Out 15:43 N/A N/A ?Taken: ?Art therapist, Slough N/A N/A ?Tissue Debrided: ?Non-Viable Tissue N/A N/A ?Level: ?0.04 N/A N/A ?Debridement A (sq cm): ?rea ?Curette N/A N/A ?Instrument: ?Minimum N/A N/A ?Bleeding: ?Pressure N/A N/A ?Hemostasis A chieved: ?0 N/A N/A ?Procedural Pain: ?0 N/A N/A ?Post Procedural Pain: ?Procedure was tolerated well N/A N/A ?Debridement Treatment Response: ?0.2x0.2x0.1 N/A N/A ?Post Debridement Measurements L x ?W x D (cm) ?0.003 N/A N/A ?Post Debridement Volume: (cm?) ?Debridement N/A N/A ?Procedures Performed: ?Treatment Notes ?Electronic Signature(s) ?Signed: 10/07/2021 4:14:33 PM By:  Kendra Maudlin MD FACS ?Signed: 10/07/2021 6:08:29 PM By: Kendra Hurst RN, BSN ?Entered By: Kendra [name] on 10/07/2021 16:14:33 ?-------------------------------------------------------------------------------- ?Multi-Disciplinary Care Plan Details ?Patient Name: ?Date of Service: ?Kendra [name], [name] 10/07/2021 3:00 PM ?Medical Record Number: [id number] ?Patient Account Number: [id number] ?Date of Birth/Sex: ?Treating RN: ?1935/10/06 (86 y.o. Kendra [name] ?Primary Care Tad Fancher: Kendra [name] ?Other Clinician: ?Referring Lillia Lengel: ?Treating Marlowe Cinquemani/Extender: Kendra [name] ?Kendra [name] ?Weeks in Treatment: 1 ?Multidisciplinary Care Plan reviewed with physician ?Active Inactive ?Abuse / Safety / Falls / Self Care Management ?Nursing Diagnoses: ?Potential for falls ?Potential for injury related to falls ?Goals: ?Patient will remain injury free related to falls ?Date Initiated: 09/29/2021 ?Target Resolution Date: 10/31/2021 ?Goal Status: Active ?Patient/caregiver will verbalize/demonstrate measures taken to prevent injury and/or falls ?Date Initiated: 09/29/2021 ?Target Resolution Date: 10/31/2021 ?Goal Status: Active ?Interventions: ?Assess Activities of Daily Living upon admission and as needed ?Assess fall risk on admission and as needed ?Assess: immobility, friction, shearing, incontinence upon admission and as needed ?Assess impairment of mobility on admission and as needed per policy ?Assess personal safety and home safety (as indicated) on admission and as needed ?Assess self care needs on admission and as needed ?Provide education on fall prevention ?Provide education on personal and home safety ?Notes: ?Wound/Skin Impairment ?Nursing Diagnoses: ?Impaired tissue integrity ?Knowledge deficit related to ulceration/compromised skin integrity ?Goals: ?Patient/caregiver will verbalize understanding of skin care regimen ?Date Initiated: 09/29/2021 ?Target Resolution Date: 10/31/2021 ?Goal Status:  Active ?Ulcer/skin breakdown will have a volume reduction of 30% by week 4 ?Date Initiated: 09/29/2021 ?Target Resolution Date: 10/31/2021 ?Goal Status: Active ?Interventions: ?Assess patient/caregiver ability to obtain necessary s

## 2021-10-13 ENCOUNTER — Encounter (HOSPITAL_BASED_OUTPATIENT_CLINIC_OR_DEPARTMENT_OTHER): Payer: Medicare Other | Admitting: General Surgery

## 2021-10-13 DIAGNOSIS — I2510 Atherosclerotic heart disease of native coronary artery without angina pectoris: Secondary | ICD-10-CM | POA: Diagnosis not present

## 2021-10-13 DIAGNOSIS — J45909 Unspecified asthma, uncomplicated: Secondary | ICD-10-CM | POA: Diagnosis not present

## 2021-10-13 DIAGNOSIS — L97822 Non-pressure chronic ulcer of other part of left lower leg with fat layer exposed: Secondary | ICD-10-CM | POA: Diagnosis not present

## 2021-10-13 DIAGNOSIS — I8393 Asymptomatic varicose veins of bilateral lower extremities: Secondary | ICD-10-CM | POA: Diagnosis not present

## 2021-10-13 DIAGNOSIS — E785 Hyperlipidemia, unspecified: Secondary | ICD-10-CM | POA: Diagnosis not present

## 2021-10-13 DIAGNOSIS — I872 Venous insufficiency (chronic) (peripheral): Secondary | ICD-10-CM | POA: Diagnosis not present

## 2021-10-13 DIAGNOSIS — I10 Essential (primary) hypertension: Secondary | ICD-10-CM | POA: Diagnosis not present

## 2021-10-13 NOTE — Progress Notes (Signed)
[name], [name] ([id number]) ?Visit Report for 10/13/2021 ?Chief Complaint Document Details ?Patient Name: Date of Service: ?Kendra [name], [name] 10/13/2021 12:30 PM ?Medical Record Number: [id number] ?Patient Account Number: [id number] ?Date of Birth/Sex: Treating RN: ?Feb 05, 1936 (86 y.o. F) Kendra [name] ?Primary Care Provider: [name] Other Clinician: ?Referring Provider: ?Treating Provider/Extender: [name] ?[name] ?Weeks in Treatment: 2 ?Information Obtained from: Patient ?Chief Complaint ?Patient seen for complaints of Non-Healing Wound on her left anterior lower leg. ?Electronic Signature(s) ?Signed: 10/13/2021 1:37:16 PM By: [name] MD FACS ?Entered By: [name] on 10/13/2021 13:37:15 ?-------------------------------------------------------------------------------- ?Debridement Details ?Patient Name: Date of Service: ?Kendra [name], [name] 10/13/2021 12:30 PM ?Medical Record Number: [id number] ?Patient Account Number: [id number] ?Date of Birth/Sex: Treating RN: ?[birth year] (86 y.o. F) Kendra [name] ?Primary Care Provider: [name] Other Clinician: ?Referring Provider: ?Treating Provider/Extender: [name] ?[name] ?Weeks in Treatment: 2 ?Debridement Performed for Assessment: Wound #2 Left,Medial Lower Leg ?Performed By: Physician [name], MD ?Debridement Type: Debridement ?Severity of Tissue Pre Debridement: Fat layer exposed ?Level of Consciousness (Pre-procedure): Awake and Alert ?Pre-procedure Verification/Time Out Yes - 13:15 ?Taken: ?Start Time: 13:15 ?Pain Control: ?Other : Benzocaine 20% ?T Area Debrided (L x W): ?otal 0.1 (cm) x 0.1 (cm) = 0.01 (cm?) ?Tissue and other material debrided: Non-Viable, Eschar, Kendra [name] ?Level: Non-Viable Tissue ?Debridement Description: Selective/Open Wound ?Instrument: Curette ?Bleeding: None ?End Time: 13:16 ?Procedural Pain: 0 ?Post Procedural Pain: 0 ?Response to Treatment: Procedure was tolerated well ?Level  of Consciousness (Post- Awake and Alert ?procedure): ?Post Debridement Measurements of Total Wound ?Length: (cm) 0.1 ?Width: (cm) 0.1 ?Depth: (cm) 0.1 ?Volume: (cm?) 0.001 ?Character of Wound/Ulcer Post Debridement: Improved ?Severity of Tissue Post Debridement: Fat layer exposed ?Post Procedure Diagnosis ?Same as Pre-procedure ?Electronic Signature(s) ?Signed: 10/13/2021 1:52:19 PM By: [name] MD FACS ?Signed: 10/13/2021 5:56:38 PM By: Dellie Catholic RN ?Entered By: Dellie Catholic on 10/13/2021 13:27:10 ?-------------------------------------------------------------------------------- ?HPI Details ?Patient Name: Date of Service: ?Kendra [name], [name] 10/13/2021 12:30 PM ?Medical Record Number: [id number] ?Patient Account Number: [id number] ?Date of Birth/Sex: Treating RN: ?1935/06/29 (86 y.o. F) Kendra [name] ?Primary Care Provider: [name] Other Clinician: ?Referring Provider: ?Treating Provider/Extender: [name] ?[name] ?Weeks in Treatment: 2 ?History of Present Illness ?HPI Description: ADMISSION ?11/14/2018 ?This is an 86 year old independent woman who apparently hit her right anterior leg on the bathtub on May 1. This initially I think was a hematoma that opened ?by description. She was seen apparently by her primary doctor and given Bactroban this made things worse but subsequently she was given doxycycline ?which improved the redness around the wound. She was seen in the ER/urgent care on 11/08/2018 when the redness became somewhat worse. She was given ?Keflex that she is completing currently. As CandS was apparently done that was negative although we do not have this result. She has 10 to 15 mm compression ?stockings that she wears. ?Past medical history; right carotid stenosis, hypertension, osteoarthritis, hyperlipidemia, coronary artery disease and asthma ?ABI in our clinic was 0.94 on the right ?6/15; wound itself is come down in size. Using silver collagen. ?6/19; comes back early  today secondary to some home repairs she might of been having next week. She has a traumatic wound on the right lateral calf which ?I debrided earlier this week everything looks quite good here. We have been using silver collagen under compression. ?6/26; slight degree of hyper granulation and a smaller wound. Chemical cautery with silver nitrate. I changed the primary dressing to [facility]. This ?  should be closed by next week ?7/10; the wound is closed. There is no open area here. She states she is still having some tenderness ?READMISSION ?09/29/2021 ?This is an 86 year old woman who was previously seen in our clinic for a nonhealing wound on her right lower extremity. She reports that about 6 to 8 weeks ?ago, she hit her leg on the door to her dishwasher. She has been cleaning the wound with isopropyl alcohol and then applying triple antibiotic ointment and a ?Band-Aid. She felt like the wound was not healing well and due to her prior experience in the wound care center, she self-referred for further evaluation and ?management. ABI in clinic today was 0.87. She is not diabetic and does not smoke. ?10/07/2021: Apparently, since our last clinic visit, the patient has had 2 separate nurse visits to address her discomfort with her Kerlix and Coban compression ?wrap. She says that she is simply unable to tolerate it. She indicates that she does have compression stockings with 10-15 mmHg pressure and she wonders if ?she could just wear these instead. Her wound is quite small with just a bit of eschar and slough. ?10/13/2021: Her wound is very nearly closed. There is just a small opening that was identified underneath the overlying eschar. ?Electronic Signature(s) ?Signed: 10/13/2021 1:37:39 PM By: [name] MD FACS ?Entered By: [name] on 10/13/2021 13:37:39 ?-------------------------------------------------------------------------------- ?Physical Exam Details ?Patient Name: Date of Service: ?Kendra [name],  [name] 10/13/2021 12:30 PM ?Medical Record Number: [id number] ?Patient Account Number: [id number] ?Date of Birth/Sex: Treating RN: ?13-Jul-1935 (86 y.o. F) Kendra [name] ?Primary Care Provider: [name] ?Other Clinician: ?Referring Provider: ?Treating Provider/Extender: [name] ?[name] ?Weeks in Treatment: 2 ?Constitutional ?She is hypertensive, but asymptomatic.. Bradycardic, asymptomatic.. . . No acute distress. ?Respiratory ?Normal work of breathing on room air. ?Notes ?10/13/2021: Her wound is very nearly closed. There is just a small opening that was identified underneath the overlying eschar. ?Electronic Signature(s) ?Signed: 10/13/2021 1:38:20 PM By: [name] MD FACS ?Entered By: [name] on 10/13/2021 13:38:20 ?-------------------------------------------------------------------------------- ?Physician Orders Details ?Patient Name: ?Date of Service: ?Kendra [name], [name] 10/13/2021 12:30 PM ?Medical Record Number: [id number] ?Patient Account Number: [id number] ?Date of Birth/Sex: ?Treating RN: ?09/29/1935 (86 y.o. 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 - Dr. Celine Ahr - Room 3 ?Return A ?Bathing/ Shower/ Hygiene ?May shower with protection but do not get wound dressing(s) wet. - Ok to use cast protector. ?Edema Control - Lymphedema / SCD / Other ?Elevate legs to the level of the heart or above for 30 minutes daily and/or when sitting, a frequency of: - throughout the day ?Avoid  standing for long periods of time. ?Exercise regularly ?Compression stocking or Garment 20-30 mm/Hg pressure to: - both legs daily ?Wound Treatment ?Wound #2 - Lower Leg Wound Laterality: Left, Medial ?Cleanser

## 2021-10-13 NOTE — Progress Notes (Signed)
[name], [name] ([id number]) ?Visit Report for 10/13/2021 ?Arrival Information Details ?Patient Name: Date of Service: ?[name], [name] 10/13/2021 12:30 PM ?Medical Record Number: [id number] ?Patient Account Number: [id number] ?Date of Birth/Sex: Treating RN: ?[birth year] (86 y.o. F) [name], [name] ?Primary Care [name]: [name] Other Clinician: ?Referring [name]: ?Treating [name]: [name] ?[name] ?Weeks in Treatment: 2 ?Visit Information History Since Last Visit ?Added or deleted any medications: No ?Patient Arrived: Ambulatory ?Any new allergies or adverse reactions: No ?Arrival Time: 13:00 ?Had a fall or experienced change in No ?Accompanied By: self ?activities of daily living that may affect ?Transfer Assistance: None ?risk of falls: ?Patient Requires Transmission-Based Precautions: No ?Signs or symptoms of abuse/neglect since last visito No ?Patient Has Alerts: No ?Hospitalized since last visit: No ?Implantable device outside of the clinic excluding No ?cellular tissue based products placed in the center ?since last visit: ?Has Dressing in Place as Prescribed: Yes ?Pain Present Now: No ?Electronic Signature(s) ?Signed: 10/13/2021 5:56:38 PM By: Dellie Catholic RN ?Entered By: Dellie Catholic on 10/13/2021 13:05:53 ?-------------------------------------------------------------------------------- ?Encounter Discharge Information Details ?Patient Name: Date of Service: ?[name], [name] 10/13/2021 12:30 PM ?Medical Record Number: [id number] ?Patient Account Number: [id number] ?Date of Birth/Sex: Treating RN: ?02/21/1936 (85 y.o. F) [name], [name] ?Primary Care Brix Brearley: [name] Other Clinician: ?Referring Courtnie Brenes: ?Treating Marvelle Span/Extender: [name] ?[name] ?Weeks in Treatment: 2 ?Encounter Discharge Information Items Post Procedure Vitals ?Discharge Condition: Stable ?Temperature (F): 98.3 ?Ambulatory Status: Ambulatory ?Pulse (bpm): 55 ?Discharge Destination:  Home ?Respiratory Rate (breaths/min): 16 ?Transportation: Private Auto ?Blood Pressure (mmHg): 155/59 ?Accompanied By: self ?Schedule Follow-up Appointment: Yes ?Clinical Summary of Care: Patient Declined ?Electronic Signature(s) ?Signed: 10/13/2021 5:56:38 PM By: Dellie Catholic RN ?Entered By: Dellie Catholic on 10/13/2021 17:54:34 ?-------------------------------------------------------------------------------- ?Lower Extremity Assessment Details ?Patient Name: ?Date of Service: ?[name], [name] 10/13/2021 12:30 PM ?Medical Record Number: [id number] ?Patient Account Number: [id number] ?Date of Birth/Sex: ?Treating RN: ?08/19/1935 (86 y.o. F) [name], [name] ?Primary Care Inger Wiest: [name] ?Other Clinician: ?Referring Eion Timbrook: ?Treating Firas Guardado/Extender: [name] ?[name] ?Weeks in Treatment: 2 ?Edema Assessment ?Assessed: [Left: No] [Right: No] ?Edema: [Left: Ye] [Right: s] ?Calf ?Left: Right: ?Point of Measurement: 29 cm From Medial Instep 28 cm ?Ankle ?Left: Right: ?Point of Measurement: 9 cm From Medial Instep 19 cm ?Electronic Signature(s) ?Signed: 10/13/2021 5:56:38 PM By: Dellie Catholic RN ?Entered By: Dellie Catholic on 10/13/2021 13:08:28 ?-------------------------------------------------------------------------------- ?Multi Wound Chart Details ?Patient Name: ?Date of Service: ?[name], [name] 10/13/2021 12:30 PM ?Medical Record Number: [id number] ?Patient Account Number: [id number] ?Date of Birth/Sex: ?Treating RN: ?1936-05-19 (85 y.o. F) [name], [name] ?Primary Care Bernadetta Roell: [name] ?Other Clinician: ?Referring Elayjah Chaney: ?Treating Nikolaos Maddocks/Extender: [name] ?[name] ?Weeks in Treatment: 2 ?Vital Signs ?Height(in): 65 ?Pulse(bpm): 55 ?Weight(lbs): 116 ?Blood Pressure(mmHg): 155/59 ?Body Mass Index(BMI): 19.3 ?Temperature(??F): 98.3 ?Respiratory Rate(breaths/min): 16 ?Photos: [N/A:N/A] ?Left, Medial Lower Leg N/A N/A ?Wound Location: ?Trauma N/A N/A ?Wounding  Event: ?Venous Leg Ulcer N/A N/A ?Primary Etiology: ?Asthma, Coronary Artery Disease, N/A N/A ?Comorbid History: ?Hypertension, Myocardial Infarction ?08/18/2021 N/A N/A ?Date Acquired: ?2 N/A N/A ?Weeks of Treatment: ?Open N/A N/A ?Wound Status: ?No N/A N/A ?Wound Recurrence: ?0.1x0.1x0.1 N/A N/A ?Measurements L x W x D (cm) ?0.008 N/A N/A ?A (cm?) : ?rea ?0.001 N/A N/A ?Volume (cm?) : ?88.70% N/A N/A ?% Reduction in Area: ?85.70% N/A N/A ?% Reduction in Volume: ?Full Thickness Without Exposed N/A N/A ?Classification: ?Support Structures ?Medium N/A N/A ?Exudate A mount: ?Serosanguineous N/A N/A ?Exudate Type: ?red, brown N/A N/A ?  Exudate Color: ?Flat and Intact N/A N/A ?Wound Margin: ?Large (67-100%) N/A N/A ?Granulation A mount: ?Red N/A N/A ?Granulation Quality: ?None Present (0%) N/A N/A ?Necrotic A mount: ?Fat Layer (Subcutaneous Tissue): Yes N/A N/A ?Exposed Structures: ?Fascia: No ?Tendon: No ?Muscle: No ?Joint: No ?Bone: No ?Large (67-100%) N/A N/A ?Epithelialization: ?Debridement - Selective/Open Wound N/A N/A ?Debridement: ?Pre-procedure Verification/Time Out 13:15 N/A N/A ?Taken: ?Other N/A N/A ?Pain Control: ?Necrotic/Eschar, Slough N/A N/A ?Tissue Debrided: ?Non-Viable Tissue N/A N/A ?Level: ?0.01 N/A N/A ?Debridement A (sq cm): ?rea ?Curette N/A N/A ?Instrument: ?None N/A N/A ?Bleeding: ?0 N/A N/A ?Procedural Pain: ?0 N/A N/A ?Post Procedural Pain: ?Procedure was tolerated well N/A N/A ?Debridement Treatment Response: ?0.1x0.1x0.1 N/A N/A ?Post Debridement Measurements L x ?W x D (cm) ?0.001 N/A N/A ?Post Debridement Volume: (cm?) ?Debridement N/A N/A ?Procedures Performed: ?Treatment Notes ?Electronic Signature(s) ?Signed: 10/13/2021 1:37:10 PM By: [name] MD FACS ?Signed: 10/13/2021 5:56:38 PM By: Dellie Catholic RN ?Entered By: [name] on 10/13/2021 13:37:10 ?-------------------------------------------------------------------------------- ?Multi-Disciplinary Care Plan Details ?Patient  Name: ?Date of Service: ?[name], [name] 10/13/2021 12:30 PM ?Medical Record Number: [id number] ?Patient Account Number: [id number] ?Date of Birth/Sex: ?Treating RN: ?01-30-1936 (86 y.o. F) [name], [name] ?Primary Care Capria Cartaya: [name] ?Other Clinician: ?Referring Lacorey Brusca: ?Treating Johndaniel Catlin/Extender: [name] ?[name] ?Weeks in Treatment: 2 ?Multidisciplinary Care Plan reviewed with physician ?Active Inactive ?Abuse / Safety / Falls / Self Care Management ?Nursing Diagnoses: ?Potential for falls ?Potential for injury related to falls ?Goals: ?Patient will remain injury free related to falls ?Date Initiated: 09/29/2021 ?Target Resolution Date: 10/31/2021 ?Goal Status: Active ?Patient/caregiver will verbalize/demonstrate measures taken to prevent injury and/or falls ?Date Initiated: 09/29/2021 ?Target Resolution Date: 10/31/2021 ?Goal Status: Active ?Interventions: ?Assess Activities of Daily Living upon admission and as needed ?Assess fall risk on admission and as needed ?Assess: immobility, friction, shearing, incontinence upon admission and as needed ?Assess impairment of mobility on admission and as needed per policy ?Assess personal safety and home safety (as indicated) on admission and as needed ?Assess self care needs on admission and as needed ?Provide education on fall prevention ?Provide education on personal and home safety ?Notes: ?Wound/Skin Impairment ?Nursing Diagnoses: ?Impaired tissue integrity ?Knowledge deficit related to ulceration/compromised skin integrity ?Goals: ?Patient/caregiver will verbalize understanding of skin care regimen ?Date Initiated: 09/29/2021 ?Target Resolution Date: 10/31/2021 ?Goal Status: Active ?Ulcer/skin breakdown will have a volume reduction of 30% by week 4 ?Date Initiated: 09/29/2021 ?Target Resolution Date: 10/31/2021 ?Goal Status: Active ?Interventions: ?Assess patient/caregiver ability to obtain necessary supplies ?Assess patient/caregiver ability to  perform ulcer/skin care regimen upon admission and as needed ?Assess ulceration(s) every visit ?Provide education on ulcer and skin care ?Notes: ?Electronic Signature(s) ?Signed: 10/13/2021 5:56:38 PM By: Tonia Ghent

## 2021-10-13 NOTE — Progress Notes (Signed)
Cardiology Office Note:    Date:  11/12/2021   ID:  Kendra [name], DOB Apr 21, 1936, MRN [id number]  PCP:  Vernie Shanks, MD   [place] [facility] Cardiologist:  Skeet Latch, MD 65    Referring MD: Vernie Shanks, MD   No chief complaint on file.   History of Present Illness:    Kendra [name] is a 86 y.o. female with a hx of asthma, CAD s/p LAD PCI, carotid stenosis, renal artery stenosis, DJD, hypertension, and hyperlipidemia, here for follow up.  She was seen 03/2021 for discussion of medication management. She last saw Dr. Einar Gip 02/2021 for her blood pressure. She has intolerances to multiple medications. She had renal artery dopplers 10/2020 which revealed greater than 50% stenosis in the bilaterally. The left renal artery was not fully visualized.  She had a CTA of the abdomen and pelvis 04/2021 which revealed 50% proximal left renal artery stenosis and 2 right renal arteries.  There was 40% proximal stenosis in the larger artery.  She had some mild plaque in the SMA and some stenosis in the IMA.  She also had 50% bilateral common iliac artery stenosis.  At that appointment, hydralazine was reduced to 25 mg. She previously did not tolerate this medicine due to headaches. She last saw Dr. Gwenlyn Found 06/2020 and her blood pressure was 138/70. At that time she was on clonidine and losartan. When she was first seen in advanced hypertension clinic her blood pressure was 214/76.  She continued to report intolerance to multiple medications.  Losartan was switched to irbesartan 150 mg twice daily.  Hydralazine was discontinued and she was started on spironolactone.  She was instructed to start taking the clonidine every 8 hours.    At her last appointment her blood pressure remained poorly controlled. Irbesartan was switched to 300 mg at night and indapamide was added. She saw Coletta Memos, NP 06/2021 and reported that she stopped taking her indapamide due to urinary retention. She was  asked to reduce her caffeine intake. Clonidine was increased. She saw Nehemiah Massed and it was noted that instead of increasing clonidine as directed, she decided to take 0.1 mg every six hours including at 3 AM. She was switched to clonidine patches. Lisinopril was added 09/2021.   Today, she reports developing a "weird" sinus headache with onset at the end of 09/2021. Her headaches typically affected her in the afternoons. This gradually improved over a week. Today her headache has been "minimal" and continues to improve. She presents a BP log and notes her readings have been about the same since her last appointment. She clarifies that for most of her readings in the 130s-140s, she was either out walking or she didn't sit and wait for 5 minutes prior to checking her BP. Also, she has noticed that the blood pressure in her left arm is consistently lower than in her right arm. She continues to wake up in the middle of the night, which she attributes to higher nocturnal blood pressures. Typically she is able to feel and notice that her blood pressure is higher at these times. Lately she denies very high spikes in BP such as in the 160'V systolic. The first time she tried the clonidine patches she felt dizzy after about an hour. Over the next few days the patches didn't seem to be working. She returned to taking the tablets of clonidine (4 times daily, every 6 hours). She has not yet started lisinopril because she was concerned  about developing more labile blood pressures (caused by a medication change) during her busy schedule. Now that the weather has improved, she is trying to exercise more frequently. For exercise she usually walks about 1.5 miles. She endorses mild asthma and usually uses her inhaler each morning. Otherwise she denies any significant exertional symptoms while walking. She denies any palpitations, chest pain, or peripheral edema. No syncope, orthopnea, or PND.  Prior  Antihypertensives: Chlorthalidone Amlodipine - chills Bystolic Nifedipine Valsartan Olmesartan spironolactone  Past Medical History:  Diagnosis Date   Asthma    CAD (coronary artery disease)    last cath 08/2006   Carotid artery disease (HCC)    moderate left ICA stenosis by 2.6 ultrasound   DJD (degenerative joint disease), lumbar    Hearing loss    HTN (hypertension)    Hyperlipidemia    Renal artery stenosis ([place]) 05/05/2021   Vision loss of right eye     Past Surgical History:  Procedure Laterality Date   ABDOMINAL HYSTERECTOMY  1975   APPENDECTOMY     CORONARY ANGIOPLASTY WITH STENT PLACEMENT  08/2006   taxus stent x2 to LAD, 2.5x61m and 2.5x837m [place] [facility] [place] 11/07/2021   Procedure: INTRAVASCULAR PRESSURE WIRE/FFR STUDY;  Surgeon: ThEarly OsmondMD;  Location: [facility];  Service: Cardiovascular;  Laterality: N/A;   LEFT HEART CATH AND CORONARY ANGIOGRAPHY N/A 11/07/2021   Procedure: LEFT HEART CATH AND CORONARY ANGIOGRAPHY;  Surgeon: ThEarly OsmondMD;  Location: [facility];  Service: Cardiovascular;  Laterality: N/A;   [facility]  [facility]  Current Medications: Current Meds  Medication Sig   albuterol (VENTOLIN HFA) 108 (90 Base) MCG/ACT inhaler Inhale 1-2 puffs into the lungs every 6 (six) hours as needed for wheezing or shortness of breath.   Ascorbic Acid (VITAMIN C) 1000 MG tablet Take 1,000 mg by mouth daily.   aspirin EC 81 MG tablet Take 81 mg by mouth daily. Swallow whole.   beclomethasone (QVAR) 80 MCG/ACT inhaler Inhale 1-2 puffs into the lungs 2 (two) times daily.   BIOTIN 5000 PO Take 1 tablet by mouth daily.   BLACK ELDERBERRY PO Take 575 mg by mouth daily.   calcium carbonate (OS-CAL - DOSED IN MG OF ELEMENTAL CALCIUM) 1250 (500 Ca) MG tablet Take 1 tablet by mouth daily.   cholecalciferol (VITAMIN D) 1000 UNITS  tablet Take 1,000 Units by mouth daily.   clobetasol (OLUX) 0.05 % topical foam Apply 1 application. topically daily as needed (sensitive scalp).   Coenzyme Q10 (CO Q-10) 100 MG CAPS Take 100 mg by mouth daily.    docusate sodium (COLACE) 100 MG capsule Take 100 mg by mouth daily as needed. (Patient not taking: Reported on 11/07/2021)   fluticasone (FLONASE) 50 MCG/ACT nasal spray Place 1 spray into both nostrils daily as needed for allergies.   Homeopathic Products (ARNICA EX) Apply 1 application. topically daily as needed (arthritis). Sore areas   lisinopril (ZESTRIL) 40 MG tablet Take 1 tablet (40 mg total) by mouth daily.   Multiple Vitamin (MULTIVITAMIN) capsule Take 1 capsule by mouth daily.   polyethylene glycol (MIRALAX / GLYCOLAX) 17 g packet Take 17 g by mouth daily.   rosuvastatin (CRESTOR) 10 MG tablet Take 10 mg by mouth every other day.   triamcinolone (KENALOG) 0.1 % Apply 1 application. topically 2 (two) times daily as needed (  rash, itching).   [DISCONTINUED] cloNIDine (CATAPRES) 0.1 MG tablet Take 0.2 mg by mouth 2 (two) times daily.   [DISCONTINUED] irbesartan (AVAPRO) 150 MG tablet Take 1 tablet by mouth 2 (two) times daily.   [DISCONTINUED] ketoconazole (NIZORAL) 2 % cream Apply 1 application topically daily. Apply to toe (Patient not taking: Reported on 11/07/2021)   [DISCONTINUED] Wheat Dextrin (BENEFIBER DRINK MIX PO) Take 1 Dose by mouth daily as needed. (Patient not taking: Reported on 11/07/2021)     Allergies:   Chlorthalidone, Other, Amlodipine, Ciprofloxacin, Dog epithelium allergy skin test, Latex, Statins, Sulfa antibiotics, Sulfamethoxazole, Zetia [ezetimibe], and Penicillins   Social History   Socioeconomic History   Marital status: Married    Spouse name: Not on file   Number of children: 1   Years of education: MAx2   Highest education level: Not on file  Occupational History   Occupation: Retired   Occupation: Education officer, museum  Tobacco Use   Smoking status:  Former    Types: Cigarettes    Quit date: 06/08/1966    Years since quitting: 55.4    Passive exposure: Past   Smokeless tobacco: Never  Vaping Use   Vaping Use: Never used  Substance and Sexual Activity   Alcohol use: Not Currently    Alcohol/week: 1.0 standard drink    Types: 1 Standard drinks or equivalent per week    Comment: wine   Drug use: No   Sexual activity: Yes  Other Topics Concern   Not on file  Social History Narrative   Not on file   Social Determinants of Health   Financial Resource Strain: Not on file  Food Insecurity: Not on file  Transportation Needs: Not on file  Physical Activity: Sufficiently Active   Days of Exercise per Week: 5 days   Minutes of Exercise per Session: 30 min  Stress: Not on file  Social Connections: Not on file     Family History: The patient's family history includes Breast cancer in her maternal grandmother; Cancer in her maternal grandmother and mother; Diabetes in her father; Heart attack in her mother and paternal grandfather; Heart failure in her father; Hyperlipidemia in her sister.  ROS:   Please see the history of present illness.    (+) Headaches All other systems reviewed and are negative.  EKGs/Labs/Other Studies Reviewed:    The following studies were reviewed today:  CT-A Abd/pelvis 04/08/21: IMPRESSION: 1. No evidence of high-grade renal artery stenosis by CTA. Single left renal artery demonstrates approximately 50% proximal stenosis. There are 2 separate right renal arteries emanating from the abdominal aorta. The larger of these to arteries demonstrates approximately 40% proximal stenosis. The smaller right renal artery demonstrates no stenosis. 2. Mild plaque at the origins of the celiac axis and SMA without evidence of significant stenosis. Some degree of stenosis is felt to be present at the origin of the IMA. 3. Calcified plaque at the origins of bilateral common iliac arteries causing roughly 50%  bilateral stenoses.   PCV Renal Artery Duplex 10/17/2020: Hemodynamically significant stenosis bilaterally. Left renal artery not  well visualized. >50% stenosis bilaterally. Normal intrarenal vascular  perfusion is noted in the right kidney. Renal length is within normal limits for right kidney. Abdominal aorta shows atherosclerotic changes and mild ectasia, consider dedicated aortic duplex if clinically indicated.   Carotid Duplex 07/05/2020: Summary:  Right Carotid: Velocities in the right ICA are consistent with a 1-39%  stenosis.   Left Carotid: Velocities in the left ICA are  consistent with a 40-59%  stenosis.   Vertebrals:  Bilateral vertebral arteries demonstrate antegrade flow.  Subclavians: Normal flow hemodynamics were seen in bilateral subclavian arteries.   Echo 04/10/2020:  1. Left ventricular ejection fraction, by estimation, is 60 to 65%. The  left ventricle has normal function. The left ventricle has no regional  wall motion abnormalities. Left ventricular diastolic parameters are  consistent with Grade I diastolic  dysfunction (impaired relaxation). Elevated left ventricular end-diastolic  pressure.   2. Right ventricular systolic function is normal. The right ventricular  size is normal. There is normal pulmonary artery systolic pressure.   3. Left atrial size was mildly dilated.   4. The mitral valve is normal in structure. Trivial mitral valve  regurgitation. No evidence of mitral stenosis. Moderate mitral annular  calcification.   5. The aortic valve is tricuspid. Aortic valve regurgitation is not  visualized. No aortic stenosis is present.   6. The inferior vena cava is normal in size with greater than 50%  respiratory variability, suggesting right atrial pressure of 3 mmHg.   EKG:   EKG is personally reviewed. 10/14/2021: EKG was not ordered.   04/03/21: No EKG  Recent Labs: 11/06/2021: ALT 13 11/08/2021: BUN 16; Creatinine, Ser 0.79; Hemoglobin 14.6;  Platelets 250; Potassium 4.3; Sodium 135   Recent Lipid Panel    Component Value Date/Time   CHOL 132 11/07/2021 0158   TRIG 27 11/07/2021 0158   HDL 64 11/07/2021 0158   CHOLHDL 2.1 11/07/2021 0158   VLDL 5 11/07/2021 0158   LDLCALC 63 11/07/2021 0158     Physical Exam:    Wt Readings from Last 3 Encounters:  11/06/21 117 lb 1 oz (53.1 kg)  10/14/21 122 lb (55.3 kg)  08/01/21 122 lb (55.3 kg)     VS:  BP (!) 154/64 (BP Location: Right Arm, Patient Position: Sitting, Cuff Size: Normal)   Pulse (!) 58   Ht '5\' 5"'$  (1.651 m)   Wt 122 lb (55.3 kg)   BMI 20.30 kg/m  , BMI Body mass index is 20.3 kg/m. GENERAL:  Well appearing HEENT: Pupils equal round and reactive, fundi not visualized, oral mucosa unremarkable NECK:  No jugular venous distention, waveform within normal limits, carotid upstroke brisk and symmetric, no bruits, no thyromegaly LUNGS:  Clear to auscultation bilaterally HEART:  RRR.  PMI not displaced or sustained,S1 and S2 within normal limits, no S3, no S4, no clicks, no rubs, no murmurs ABD:  Flat, positive bowel sounds normal in frequency in pitch, no bruits, no rebound, no guarding, no midline pulsatile mass, no hepatomegaly, no splenomegaly EXT:  2 plus pulses throughout, no edema, no cyanosis no clubbing; Neuroma of right ankle SKIN:  No rashes no nodules NEURO:  Cranial nerves II through XII grossly intact, motor grossly intact throughout PSYCH:  Cognitively intact, oriented to person place and time   ASSESSMENT:    1. Renal artery stenosis ([place])   2. Essential hypertension   3. Stenosis of left carotid artery   4. Mixed hyperlipidemia      PLAN:    Essential hypertension Multiple medication intolerances.  Blood pressure remains above goal.  She has not yet started the lisinopril.  She will stop irbesartan and have her start the lisinopril '40mg'$  daily.  We discussed the importance of taking her medication as prescribed.  She will take clonidine 0.'1mg'$   tid and will take 0.'2mg'$  at bedtime.  Carotid artery disease ([place]) She is due to repeat carotid Dopplers.  We will order. Continue aspirin and rosuvastatin.   Hyperlipidemia LDL goal <70.  Well controlled on rosuvastatin.    Disposition: FU with PharmD in 1 month. FU with Jacquel Mccamish C. Oval Linsey, MD, [facility] in 4 months.   Medication Adjustments/Labs and Tests Ordered: Current medicines are reviewed at length with the patient today.  Concerns regarding medicines are outlined above.   Orders Placed This Encounter  Procedures   AMB Referral to [facility]   VAS US RENAL ARTERY DUPLEX   VAS US CAROTID   Meds ordered this encounter  Medications   cloNIDine (CATAPRES) 0.1 MG tablet    Sig: TAKE 1 TABLET THREE TIMES A DAY AND 2 TABLETS AT BEDTIME    Dispense:  150 tablet    Refill:  5    NEW DOSE, D/C PREVIOUS RX   Patient Instructions  Medication Instructions:  STOP IRBESARTAN   START LISINOPRIL 40 MG DAILY   TAKE YOUR CLONIDINE 0.1 MG THREE TIMES A DAY AND TAKE 2 TABLETS AT BEDTIME   *If you need a refill on your cardiac medications before your next appointment, please call your pharmacy*  Lab Work: NONE  Testing/Procedures: Your physician has requested that you have a carotid duplex. This test is an ultrasound of the carotid arteries in your neck. It looks at blood flow through these arteries that supply the brain with blood. Allow one hour for this exam. There are no restrictions or special instructions.  Your physician has requested that you have a renal artery duplex. During this test, an ultrasound is used to evaluate blood flow to the kidneys. Allow one hour for this exam. Do not eat after midnight the day before and avoid carbonated beverages. Take your medications as you usually do.  Follow-Up: WITH PHARM D IN 1 MONTH    THE OFFICE WILL CALL YOU TO SCHEDULE APPOINTMENTS      Signed, Skeet Latch, MD  11/12/2021 12:16 PM    [place]

## 2021-10-14 ENCOUNTER — Ambulatory Visit (INDEPENDENT_AMBULATORY_CARE_PROVIDER_SITE_OTHER): Payer: Medicare Other | Admitting: Cardiovascular Disease

## 2021-10-14 VITALS — BP 154/64 | HR 58 | Ht 65.0 in | Wt 122.0 lb

## 2021-10-14 DIAGNOSIS — I10 Essential (primary) hypertension: Secondary | ICD-10-CM

## 2021-10-14 DIAGNOSIS — I701 Atherosclerosis of renal artery: Secondary | ICD-10-CM | POA: Diagnosis not present

## 2021-10-14 DIAGNOSIS — I6522 Occlusion and stenosis of left carotid artery: Secondary | ICD-10-CM | POA: Diagnosis not present

## 2021-10-14 DIAGNOSIS — E782 Mixed hyperlipidemia: Secondary | ICD-10-CM

## 2021-10-14 MED ORDER — CLONIDINE HCL 0.1 MG PO TABS: ORAL_TABLET | ORAL | 5 refills | Status: DC

## 2021-10-14 NOTE — Patient Instructions (Addendum)
Medication Instructions:  ?STOP IRBESARTAN  ? ?START LISINOPRIL 40 MG DAILY  ? ?TAKE YOUR CLONIDINE 0.1 MG THREE TIMES A DAY AND TAKE 2 TABLETS AT BEDTIME  ? ?*If you need a refill on your cardiac medications before your next appointment, please call your pharmacy* ? ?Lab Work: ?NONE ? ?Testing/Procedures: ?Your physician has requested that you have a carotid duplex. This test is an ultrasound of the carotid arteries in your neck. It looks at blood flow through these arteries that supply the brain with blood. Allow one hour for this exam. There are no restrictions or special instructions. ? ?Your physician has requested that you have a renal artery duplex. During this test, an ultrasound is used to evaluate blood flow to the kidneys. Allow one hour for this exam. Do not eat after midnight the day before and avoid carbonated beverages. Take your medications as you usually do. ? ?Follow-Up: ?WITH PHARM D IN 1 MONTH  ?  ?THE OFFICE WILL CALL YOU TO SCHEDULE APPOINTMENTS  ?  ? ?

## 2021-10-14 NOTE — Assessment & Plan Note (Addendum)
Multiple medication intolerances.  Blood pressure remains above goal.  She has not yet started the lisinopril.  She will stop irbesartan and have her start the lisinopril '40mg'$  daily.  We discussed the importance of taking her medication as prescribed.  She will take clonidine 0.'1mg'$  tid and will take 0.'2mg'$  at bedtime.

## 2021-10-15 DIAGNOSIS — J019 Acute sinusitis, unspecified: Secondary | ICD-10-CM | POA: Diagnosis not present

## 2021-10-20 ENCOUNTER — Encounter (HOSPITAL_BASED_OUTPATIENT_CLINIC_OR_DEPARTMENT_OTHER): Payer: Medicare Other | Admitting: General Surgery

## 2021-10-20 DIAGNOSIS — J45909 Unspecified asthma, uncomplicated: Secondary | ICD-10-CM | POA: Diagnosis not present

## 2021-10-20 DIAGNOSIS — I10 Essential (primary) hypertension: Secondary | ICD-10-CM | POA: Diagnosis not present

## 2021-10-20 DIAGNOSIS — L97822 Non-pressure chronic ulcer of other part of left lower leg with fat layer exposed: Secondary | ICD-10-CM | POA: Diagnosis not present

## 2021-10-20 DIAGNOSIS — E785 Hyperlipidemia, unspecified: Secondary | ICD-10-CM | POA: Diagnosis not present

## 2021-10-20 DIAGNOSIS — I2510 Atherosclerotic heart disease of native coronary artery without angina pectoris: Secondary | ICD-10-CM | POA: Diagnosis not present

## 2021-10-20 DIAGNOSIS — I8393 Asymptomatic varicose veins of bilateral lower extremities: Secondary | ICD-10-CM | POA: Diagnosis not present

## 2021-10-20 NOTE — Progress Notes (Addendum)
Nissen, Nalina E. ([id number]) ?Visit Report for 10/20/2021 ?Chief Complaint Document Details ?Patient Name: Date of Service: ?Kendra [name], [name] 10/20/2021 1:15 PM ?Medical Record Number: [id number] ?Patient Account Number: [id number] ?Date of Birth/Sex: Treating RN: ?1935-08-30 (86 y.o. F) [name], [name] ?Primary Care Provider: [name] Other Clinician: ?Referring Provider: ?Treating Provider/Extender: [name] ?[name] ?Weeks in Treatment: 3 ?Information Obtained from: Patient ?Chief Complaint ?Patient seen for complaints of Non-Healing Wound on her left anterior lower leg. ?Electronic Signature(s) ?Signed: 10/20/2021 1:53:37 PM By: [name] MD FACS ?Entered By: [name] on 10/20/2021 13:53:36 ?-------------------------------------------------------------------------------- ?HPI Details ?Patient Name: Date of Service: ?Kendra [name], [name] 10/20/2021 1:15 PM ?Medical Record Number: [id number] ?Patient Account Number: [id number] ?Date of Birth/Sex: Treating RN: ?11-06-1935 (86 y.o. F) [name], [name] ?Primary Care Provider: [name] Other Clinician: ?Referring Provider: ?Treating Provider/Extender: [name] ?[name] ?Weeks in Treatment: 3 ?History of Present Illness ?HPI Description: ADMISSION ?11/14/2018 ?This is an 86 year old independent woman who apparently hit her right anterior leg on the bathtub on May 1. This initially I think was a hematoma that opened ?by description. She was seen apparently by her primary doctor and given Bactroban this made things worse but subsequently she was given doxycycline ?which improved the redness around the wound. She was seen in the ER/urgent care on 11/08/2018 when the redness became somewhat worse. She was given ?Keflex that she is completing currently. As CandS was apparently done that was negative although we do not have this result. She has 10 to 15 mm compression ?stockings that she wears. ?Past medical history; right carotid  stenosis, hypertension, osteoarthritis, hyperlipidemia, coronary artery disease and asthma ?ABI in our clinic was 0.94 on the right ?6/15; wound itself is come down in size. Using silver collagen. ?6/19; comes back early today secondary to some home repairs she might of been having next week. She has a traumatic wound on the right lateral calf which ?I debrided earlier this week everything looks quite good here. We have been using silver collagen under compression. ?6/26; slight degree of hyper granulation and a smaller wound. Chemical cautery with silver nitrate. I changed the primary dressing to [facility]. This ?should be closed by next week ?7/10; the wound is closed. There is no open area here. She states she is still having some tenderness ?READMISSION ?09/29/2021 ?This is an 86 year old woman who was previously seen in our clinic for a nonhealing wound on her right lower extremity. She reports that about 6 to 8 weeks ?ago, she hit her leg on the door to her dishwasher. She has been cleaning the wound with isopropyl alcohol and then applying triple antibiotic ointment and a ?Band-Aid. She felt like the wound was not healing well and due to her prior experience in the wound care center, she self-referred for further evaluation and ?management. ABI in clinic today was 0.87. She is not diabetic and does not smoke. ?10/07/2021: Apparently, since our last clinic visit, the patient has had 2 separate nurse visits to address her discomfort with her Kerlix and Coban compression ?wrap. She says that she is simply unable to tolerate it. She indicates that she does have compression stockings with 10-15 mmHg pressure and she wonders if ?she could just wear these instead. Her wound is quite small with just a bit of eschar and slough. ?10/13/2021: Her wound is very nearly closed. There is just a small opening that was identified underneath the overlying eschar. ?10/20/2021: Her wound is closed. ?Electronic  Signature(s) ?Signed: 10/20/2021  1:54:20 PM By: [name] MD FACS ?Entered By: [name] on 10/20/2021 13:54:19 ?-------------------------------------------------------------------------------- ?Physical Exam Details ?Patient Name: Date of Service: ?Kendra [name], [name] 10/20/2021 1:15 PM ?Medical Record Number: [id number] ?Patient Account Number: [id number] ?Date of Birth/Sex: Treating RN: ?03-11-1936 (86 y.o. F) [name], [name] ?Primary Care Provider: [name] Other Clinician: ?Referring Provider: ?Treating Provider/Extender: [name] ?[name] ?Weeks in Treatment: 3 ?Constitutional ?Slightly hypertensive. Bradycardic, asymptomatic.. . . No acute distress. ?Respiratory ?Normal work of breathing on room air. ?Notes ?10/20/2021: Her wound is closed. ?Electronic Signature(s) ?Signed: 10/20/2021 1:54:55 PM By: [name] MD FACS ?Entered By: [name] on 10/20/2021 13:54:54 ?-------------------------------------------------------------------------------- ?Physician Orders Details ?Patient Name: Date of Service: ?Kendra [name], [name] 10/20/2021 1:15 PM ?Medical Record Number: [id number] ?Patient Account Number: [id number] ?Date of Birth/Sex: Treating RN: ?[birth year] (86 y.o. 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 ?Electronic Signature(s) ?Signed: 10/20/2021 1:56:28 PM By: [name] MD FACS ?Entered By: [name] on  10/20/2021 13:56:28 ?-------------------------------------------------------------------------------- ?Problem List Details ?Patient Name: ?Date of Service: ?Kendra [name], [name] 10/20/2021 1:15 PM ?Medical Record Number: [id number] ?Patient Account Number: [id number] ?Date of Birth/Sex: ?Treating RN: ?12-Apr-1936 (86 y.o. F) [name], [name] ?Primary Care Provider: [name] ?Other Clinician: ?Referring Provider: ?Treating Provider/Extender: [name] ?[name] ?Weeks in Treatment: 3 ?Active Problems ?ICD-10 ?Encounter ?Code Description Active Date MDM ?Diagnosis ?H88.502 Non-pressure chronic ulcer of other part of left lower leg with fat layer exposed4/24/2023 No Yes ?I83.93 Asymptomatic varicose veins of bilateral lower extremities 09/29/2021 No Yes ?I25.10 Atherosclerotic heart disease of native coronary artery without angina pectoris 09/29/2021 No Yes ?Inactive Problems ?Resolved Problems ?Electronic Signature(s) ?Signed: 10/20/2021 1:53:27 PM By: [name] MD FACS ?Entered By: [name] on 10/20/2021 13:53:26 ?-------------------------------------------------------------------------------- ?Progress Note Details ?Patient Name: ?Date of Service: ?Kendra [name], [name] 10/20/2021 1:15 PM ?Medical Record Number: [id number] ?Patient Account Number: [id number] ?Date of Birth/Sex: ?Treating RN: ?1935-12-14 (87 y.o. F) [name], [name] ?Primary Care Provider: [name] ?Other Clinician: ?Referring Provider: ?Treating Provider/Extender: [name] ?[name] ?Weeks in Treatment: 3 ?Subjective ?Chief Complaint ?Information obtained from Patient ?Patient seen for complaints of Non-Healing Wound on her left anterior lower leg. ?History of Present Illness (HPI) ?ADMISSION ?11/14/2018 ?This is an 86 year old independent woman who apparently hit her right anterior leg on the bathtub on May 1. This initially I think was a hematoma that opened ?by description. She was seen apparently by her primary  doctor and given Bactroban this made things worse but subsequently she was given doxycycline ?which improved the redness around the wound. She was seen in the ER/urgent care on 11/08/2018 when the redness became somewhat worse. She wa

## 2021-10-20 NOTE — Progress Notes (Signed)
Maturin, Aleeta E. ([id number]) ?Visit Report for 10/20/2021 ?Arrival Information Details ?Patient Name: Date of Service: ?Kendra [name], [name] 10/20/2021 1:15 PM ?Medical Record Number: [id number] ?Patient Account Number: [id number] ?Date of Birth/Sex: Treating RN: ?12/23/1935 (86 y.o. F) [name], [name] ?Primary Care Kimi Kroft: [name] Other Clinician: ?Referring Malak Orantes: ?Treating Stassi Fadely/Extender: [name] ?[name] ?Weeks in Treatment: 3 ?Visit Information History Since Last Visit ?Added or deleted any medications: No ?Patient Arrived: Ambulatory ?Any new allergies or adverse reactions: No ?Arrival Time: 13:35 ?Had a fall or experienced change in No ?Accompanied By: self ?activities of daily living that may affect ?Transfer Assistance: None ?risk of falls: ?Patient Identification Verified: Yes ?Signs or symptoms of abuse/neglect since last visito No ?Patient Requires Transmission-Based Precautions: No ?Hospitalized since last visit: No ?Patient Has Alerts: No ?Implantable device outside of the clinic excluding No ?cellular tissue based products placed in the center ?since last visit: ?Has Dressing in Place as Prescribed: Yes ?Pain Present Now: No ?Electronic Signature(s) ?Signed: 10/20/2021 5:47:28 PM By: Dellie Catholic RN ?Entered By: Dellie Catholic on 10/20/2021 13:43:35 ?-------------------------------------------------------------------------------- ?Clinic Level of Care Assessment Details ?Patient Name: Date of Service: ?Kendra [name], [name] 10/20/2021 1:15 PM ?Medical Record Number: [id number] ?Patient Account Number: [id number] ?Date of Birth/Sex: Treating RN: ?Apr 28, 1936 (86 y.o. F) [name], [name] ?Primary Care Lucill Mauck: [name] Other Clinician: ?Referring Catalina Salasar: ?Treating Johnika Escareno/Extender: [name] ?[name] ?Weeks in Treatment: 3 ?Clinic Level of Care Assessment Items ?TOOL 4 Quantity Score ?X- 1 0 ?Use when only an EandM is performed on FOLLOW-UP visit ?ASSESSMENTS -  Nursing Assessment / Reassessment ?X- 1 10 ?Reassessment of Co-morbidities (includes updates in patient status) ?X- 1 5 ?Reassessment of Adherence to Treatment Plan ?ASSESSMENTS - Wound and Skin A ssessment / Reassessment ?X - Simple Wound Assessment / Reassessment - one wound 1 5 ?'[]'$  - 0 ?Complex Wound Assessment / Reassessment - multiple wounds ?'[]'$  - 0 ?Dermatologic / Skin Assessment (not related to wound area) ?ASSESSMENTS - Focused Assessment ?'[]'$  - 0 ?Circumferential Edema Measurements - multi extremities ?'[]'$  - 0 ?Nutritional Assessment / Counseling / Intervention ?'[]'$  - 0 ?Lower Extremity Assessment (monofilament, tuning fork, pulses) ?'[]'$  - 0 ?Peripheral Arterial Disease Assessment (using hand held doppler) ?ASSESSMENTS - Ostomy and/or Continence Assessment and Care ?'[]'$  - 0 ?Incontinence Assessment and Management ?'[]'$  - 0 ?Ostomy Care Assessment and Management (repouching, etc.) ?PROCESS - Coordination of Care ?X - Simple Patient / Family Education for ongoing care 1 15 ?'[]'$  - 0 ?Complex (extensive) Patient / Family Education for ongoing care ?X- 1 10 ?Staff obtains Consents, Records, T Results / Process Orders ?est ?X- 1 10 ?Staff telephones HHA, Nursing Homes / Clarify orders / etc ?'[]'$  - 0 ?Routine Transfer to another Facility (non-emergent condition) ?'[]'$  - 0 ?Routine Hospital Admission (non-emergent condition) ?'[]'$  - 0 ?New Admissions / Biomedical engineer / Ordering NPWT Apligraf, etc. ?, ?'[]'$  - 0 ?Emergency Hospital Admission (emergent condition) ?X- 1 10 ?Simple Discharge Coordination ?'[]'$  - 0 ?Complex (extensive) Discharge Coordination ?PROCESS - Special Needs ?'[]'$  - 0 ?Pediatric / Minor Patient Management ?'[]'$  - 0 ?Isolation Patient Management ?'[]'$  - 0 ?Hearing / Language / Visual special needs ?'[]'$  - 0 ?Assessment of Community assistance (transportation, D/C planning, etc.) ?'[]'$  - 0 ?Additional assistance / Altered mentation ?'[]'$  - 0 ?Support Surface(s) Assessment (bed, cushion, seat, etc.) ?INTERVENTIONS -  Wound Cleansing / Measurement ?X - Simple Wound Cleansing - one wound 1 5 ?'[]'$  - 0 ?Complex Wound Cleansing - multiple wounds ?X- 1 5 ?Wound Imaging (photographs -  any number of wounds) ?'[]'$  - 0 ?Wound Tracing (instead of photographs) ?X- 1 5 ?Simple Wound Measurement - one wound ?'[]'$  - 0 ?Complex Wound Measurement - multiple wounds ?INTERVENTIONS - Wound Dressings ?'[]'$  - 0 ?Small Wound Dressing one or multiple wounds ?'[]'$  - 0 ?Medium Wound Dressing one or multiple wounds ?'[]'$  - 0 ?Large Wound Dressing one or multiple wounds ?'[]'$  - 0 ?Application of Medications - topical ?'[]'$  - 0 ?Application of Medications - injection ?INTERVENTIONS - Miscellaneous ?'[]'$  - 0 ?External ear exam ?'[]'$  - 0 ?Specimen Collection (cultures, biopsies, blood, body fluids, etc.) ?'[]'$  - 0 ?Specimen(s) / Culture(s) sent or taken to Lab for analysis ?'[]'$  - 0 ?Patient Transfer (multiple staff / Civil Service fast streamer / Similar devices) ?'[]'$  - 0 ?Simple Staple / Suture removal (25 or less) ?'[]'$  - 0 ?Complex Staple / Suture removal (26 or more) ?'[]'$  - 0 ?Hypo / Hyperglycemic Management (close monitor of Blood Glucose) ?'[]'$  - 0 ?Ankle / Brachial Index (ABI) - do not check if billed separately ?X- 1 5 ?Vital Signs ?Has the patient been seen at the hospital within the last three years: Yes ?Total Score: 85 ?Level Of Care: New/Established - Level 3 ?Electronic Signature(s) ?Signed: 10/20/2021 5:47:28 PM By: Dellie Catholic RN ?Entered By: Dellie Catholic on 10/20/2021 17:45:42 ?-------------------------------------------------------------------------------- ?Encounter Discharge Information Details ?Patient Name: Date of Service: ?Kendra [name], [name] 10/20/2021 1:15 PM ?Medical Record Number: [id number] ?Patient Account Number: [id number] ?Date of Birth/Sex: Treating RN: ?21-Jun-1935 (86 y.o. F) [name], [name] ?Primary Care Jerrick Farve: [name] Other Clinician: ?Referring Selah Klang: ?Treating Nachman Sundt/Extender: [name] ?[name] ?Weeks in Treatment: 3 ?Encounter  Discharge Information Items ?Discharge Condition: Stable ?Ambulatory Status: Ambulatory ?Discharge Destination: Home ?Transportation: Private Auto ?Accompanied By: self ?Schedule Follow-up Appointment: Yes ?Clinical Summary of Care: Patient Declined ?Electronic Signature(s) ?Signed: 10/20/2021 5:47:28 PM By: Dellie Catholic RN ?Entered By: Dellie Catholic on 10/20/2021 17:46:43 ?-------------------------------------------------------------------------------- ?Lower Extremity Assessment Details ?Patient Name: Date of Service: ?Kendra [name], [name] 10/20/2021 1:15 PM ?Medical Record Number: [id number] ?Patient Account Number: [id number] ?Date of Birth/Sex: Treating RN: ?11-Oct-1935 (86 y.o. F) [name], [name] ?Primary Care Odas Ozer: [name] Other Clinician: ?Referring Anetta Olvera: ?Treating Donnetta Gillin/Extender: [name] ?[name] ?Weeks in Treatment: 3 ?Edema Assessment ?Assessed: [Left: No] [Right: No] ?Edema: [Left: Ye] [Right: s] ?Calf ?Left: Right: ?Point of Measurement: 29 cm From Medial Instep 28 cm ?Ankle ?Left: Right: ?Point of Measurement: 9 cm From Medial Instep 19 cm ?Electronic Signature(s) ?Signed: 10/20/2021 5:47:28 PM By: Dellie Catholic RN ?Entered By: Dellie Catholic on 10/20/2021 13:50:52 ?-------------------------------------------------------------------------------- ?Multi Wound Chart Details ?Patient Name: ?Date of Service: ?Kendra [name], [name] 10/20/2021 1:15 PM ?Medical Record Number: [id number] ?Patient Account Number: [id number] ?Date of Birth/Sex: ?Treating RN: ?1935-08-27 (86 y.o. F) [name], [name] ?Primary Care Everardo Voris: [name] ?Other Clinician: ?Referring Latresa Gasser: ?Treating Lamerle Jabs/Extender: [name] ?[name] ?Weeks in Treatment: 3 ?Vital Signs ?Height(in): 65 ?Pulse(bpm): 52 ?Weight(lbs): 116 ?Blood Pressure(mmHg): 159/65 ?Body Mass Index(BMI): 19.3 ?Temperature(??F): 98.4 ?Respiratory Rate(breaths/min): 16 ?Photos: [N/A:N/A] ?Left, Medial Lower Leg N/A N/A ?Wound  Location: ?Trauma N/A N/A ?Wounding Event: ?Venous Leg Ulcer N/A N/A ?Primary Etiology: ?Asthma, Coronary Artery Disease, N/A N/A ?Comorbid History: ?Hypertension, Myocardial Infarction ?08/18/2021 N/A N/A ?Date

## 2021-10-21 DIAGNOSIS — H25093 Other age-related incipient cataract, bilateral: Secondary | ICD-10-CM | POA: Diagnosis not present

## 2021-10-21 DIAGNOSIS — R739 Hyperglycemia, unspecified: Secondary | ICD-10-CM | POA: Diagnosis not present

## 2021-10-21 DIAGNOSIS — H35033 Hypertensive retinopathy, bilateral: Secondary | ICD-10-CM | POA: Diagnosis not present

## 2021-10-21 DIAGNOSIS — D692 Other nonthrombocytopenic purpura: Secondary | ICD-10-CM | POA: Diagnosis not present

## 2021-10-21 DIAGNOSIS — Z8673 Personal history of transient ischemic attack (TIA), and cerebral infarction without residual deficits: Secondary | ICD-10-CM | POA: Diagnosis not present

## 2021-10-21 DIAGNOSIS — J4541 Moderate persistent asthma with (acute) exacerbation: Secondary | ICD-10-CM | POA: Diagnosis not present

## 2021-10-21 DIAGNOSIS — I701 Atherosclerosis of renal artery: Secondary | ICD-10-CM | POA: Diagnosis not present

## 2021-10-21 DIAGNOSIS — I25118 Atherosclerotic heart disease of native coronary artery with other forms of angina pectoris: Secondary | ICD-10-CM | POA: Diagnosis not present

## 2021-10-21 DIAGNOSIS — I10 Essential (primary) hypertension: Secondary | ICD-10-CM | POA: Diagnosis not present

## 2021-10-21 DIAGNOSIS — E782 Mixed hyperlipidemia: Secondary | ICD-10-CM | POA: Diagnosis not present

## 2021-10-30 ENCOUNTER — Ambulatory Visit (HOSPITAL_BASED_OUTPATIENT_CLINIC_OR_DEPARTMENT_OTHER): Payer: Medicare Other

## 2021-10-30 ENCOUNTER — Encounter (HOSPITAL_BASED_OUTPATIENT_CLINIC_OR_DEPARTMENT_OTHER): Payer: Medicare Other

## 2021-10-30 DIAGNOSIS — I701 Atherosclerosis of renal artery: Secondary | ICD-10-CM

## 2021-10-30 DIAGNOSIS — I10 Essential (primary) hypertension: Secondary | ICD-10-CM

## 2021-11-04 ENCOUNTER — Ambulatory Visit (INDEPENDENT_AMBULATORY_CARE_PROVIDER_SITE_OTHER): Payer: Medicare Other

## 2021-11-04 DIAGNOSIS — I6522 Occlusion and stenosis of left carotid artery: Secondary | ICD-10-CM

## 2021-11-05 DIAGNOSIS — S39012A Strain of muscle, fascia and tendon of lower back, initial encounter: Secondary | ICD-10-CM | POA: Diagnosis not present

## 2021-11-06 ENCOUNTER — Other Ambulatory Visit: Payer: Self-pay

## 2021-11-06 ENCOUNTER — Emergency Department (HOSPITAL_BASED_OUTPATIENT_CLINIC_OR_DEPARTMENT_OTHER): Payer: Medicare Other

## 2021-11-06 ENCOUNTER — Inpatient Hospital Stay (HOSPITAL_BASED_OUTPATIENT_CLINIC_OR_DEPARTMENT_OTHER)
Admission: EM | Admit: 2021-11-06 | Discharge: 2021-11-08 | DRG: 250 | Disposition: A | Discharge status: Home / Self Care | Payer: Medicare Other | Attending: Cardiology | Admitting: Cardiology

## 2021-11-06 ENCOUNTER — Encounter (HOSPITAL_BASED_OUTPATIENT_CLINIC_OR_DEPARTMENT_OTHER): Payer: Self-pay | Admitting: Obstetrics and Gynecology

## 2021-11-06 ENCOUNTER — Emergency Department (HOSPITAL_BASED_OUTPATIENT_CLINIC_OR_DEPARTMENT_OTHER): Payer: Medicare Other | Admitting: Radiology

## 2021-11-06 DIAGNOSIS — Z882 Allergy status to sulfonamides status: Secondary | ICD-10-CM | POA: Diagnosis not present

## 2021-11-06 DIAGNOSIS — E785 Hyperlipidemia, unspecified: Secondary | ICD-10-CM | POA: Diagnosis present

## 2021-11-06 DIAGNOSIS — Z79899 Other long term (current) drug therapy: Secondary | ICD-10-CM

## 2021-11-06 DIAGNOSIS — I214 Non-ST elevation (NSTEMI) myocardial infarction: Secondary | ICD-10-CM | POA: Diagnosis present

## 2021-11-06 DIAGNOSIS — T82855A Stenosis of coronary artery stent, initial encounter: Principal | ICD-10-CM | POA: Diagnosis present

## 2021-11-06 DIAGNOSIS — Z881 Allergy status to other antibiotic agents status: Secondary | ICD-10-CM | POA: Diagnosis not present

## 2021-11-06 DIAGNOSIS — I25110 Atherosclerotic heart disease of native coronary artery with unstable angina pectoris: Secondary | ICD-10-CM | POA: Diagnosis present

## 2021-11-06 DIAGNOSIS — Z888 Allergy status to other drugs, medicaments and biological substances status: Secondary | ICD-10-CM | POA: Diagnosis not present

## 2021-11-06 DIAGNOSIS — Z87891 Personal history of nicotine dependence: Secondary | ICD-10-CM | POA: Diagnosis not present

## 2021-11-06 DIAGNOSIS — R42 Dizziness and giddiness: Secondary | ICD-10-CM | POA: Diagnosis not present

## 2021-11-06 DIAGNOSIS — Z880 Allergy status to penicillin: Secondary | ICD-10-CM | POA: Diagnosis not present

## 2021-11-06 DIAGNOSIS — Z8249 Family history of ischemic heart disease and other diseases of the circulatory system: Secondary | ICD-10-CM

## 2021-11-06 DIAGNOSIS — Y831 Surgical operation with implant of artificial internal device as the cause of abnormal reaction of the patient, or of later complication, without mention of misadventure at the time of the procedure: Secondary | ICD-10-CM | POA: Diagnosis present

## 2021-11-06 DIAGNOSIS — H9190 Unspecified hearing loss, unspecified ear: Secondary | ICD-10-CM | POA: Diagnosis present

## 2021-11-06 DIAGNOSIS — Z7982 Long term (current) use of aspirin: Secondary | ICD-10-CM | POA: Diagnosis not present

## 2021-11-06 DIAGNOSIS — R079 Chest pain, unspecified: Secondary | ICD-10-CM | POA: Diagnosis not present

## 2021-11-06 DIAGNOSIS — H5461 Unqualified visual loss, right eye, normal vision left eye: Secondary | ICD-10-CM | POA: Diagnosis present

## 2021-11-06 DIAGNOSIS — Z955 Presence of coronary angioplasty implant and graft: Secondary | ICD-10-CM | POA: Diagnosis not present

## 2021-11-06 DIAGNOSIS — J45909 Unspecified asthma, uncomplicated: Secondary | ICD-10-CM | POA: Diagnosis present

## 2021-11-06 DIAGNOSIS — I2510 Atherosclerotic heart disease of native coronary artery without angina pectoris: Secondary | ICD-10-CM | POA: Diagnosis present

## 2021-11-06 DIAGNOSIS — Z91040 Latex allergy status: Secondary | ICD-10-CM

## 2021-11-06 DIAGNOSIS — Z83438 Family history of other disorder of lipoprotein metabolism and other lipidemia: Secondary | ICD-10-CM | POA: Diagnosis not present

## 2021-11-06 DIAGNOSIS — I10 Essential (primary) hypertension: Secondary | ICD-10-CM | POA: Diagnosis present

## 2021-11-06 LAB — COMPREHENSIVE METABOLIC PANEL
ALT: 13 U/L (ref 0–44)
AST: 22 U/L (ref 15–41)
Albumin: 4.1 g/dL (ref 3.5–5.0)
Alkaline Phosphatase: 47 U/L (ref 38–126)
Anion gap: 9 (ref 5–15)
BUN: 17 mg/dL (ref 8–23)
CO2: 26 mmol/L (ref 22–32)
Calcium: 9.4 mg/dL (ref 8.9–10.3)
Chloride: 99 mmol/L (ref 98–111)
Creatinine, Ser: 0.67 mg/dL (ref 0.44–1.00)
GFR, Estimated: >60 mL/min (ref >60)
Glucose, Bld: 122 mg/dL — ABNORMAL HIGH (ref 70–99)
Potassium: 4.3 mmol/L (ref 3.5–5.1)
Sodium: 134 mmol/L — ABNORMAL LOW (ref 135–145)
Total Bilirubin: 0.8 mg/dL (ref 0.3–1.2)
Total Protein: 7 g/dL (ref 6.5–8.1)

## 2021-11-06 LAB — CBC
HCT: 44.3 % (ref 36.0–46.0)
Hemoglobin: 14.9 g/dL (ref 12.0–15.0)
MCH: 29 pg (ref 26.0–34.0)
MCHC: 33.6 g/dL (ref 30.0–36.0)
MCV: 86.4 fL (ref 80.0–100.0)
Platelets: 258 10*3/uL (ref 150–400)
RBC: 5.13 MIL/uL — ABNORMAL HIGH (ref 3.87–5.11)
RDW: 13.6 % (ref 11.5–15.5)
WBC: 12.6 10*3/uL — ABNORMAL HIGH (ref 4.0–10.5)
nRBC: 0 % (ref 0.0–0.2)

## 2021-11-06 LAB — TROPONIN I (HIGH SENSITIVITY)
Troponin I (High Sensitivity): 216 ng/L (ref <18)
Troponin I (High Sensitivity): 320 ng/L (ref <18)

## 2021-11-06 LAB — CBG MONITORING, ED: Glucose-Capillary: 116 mg/dL — ABNORMAL HIGH (ref 70–99)

## 2021-11-06 LAB — LIPASE, BLOOD: Lipase: 38 U/L (ref 11–51)

## 2021-11-06 MED ORDER — CLONIDINE HCL 0.1 MG PO TABS
0.1000 mg | ORAL_TABLET | Freq: Three times a day (TID) | ORAL | Status: DC
  Administered 2021-11-07 – 2021-11-08 (×4): 0.1 mg via ORAL
  Filled 2021-11-06 (×4): qty 1

## 2021-11-06 MED ORDER — DOCUSATE SODIUM 100 MG PO CAPS: 100.0000 mg | ORAL_CAPSULE | Freq: Every day | ORAL | Status: DC | PRN

## 2021-11-06 MED ORDER — BUDESONIDE 0.5 MG/2ML IN SUSP
0.5000 mg | Freq: Two times a day (BID) | RESPIRATORY_TRACT | Status: DC
  Filled 2021-11-06 (×2): qty 2

## 2021-11-06 MED ORDER — SODIUM CHLORIDE 0.9% FLUSH
3.0000 mL | Freq: Two times a day (BID) | INTRAVENOUS | Status: DC
  Administered 2021-11-06 – 2021-11-07 (×2): 3 mL via INTRAVENOUS

## 2021-11-06 MED ORDER — SODIUM CHLORIDE 0.9% FLUSH: 3.0000 mL | INTRAVENOUS | Status: DC | PRN

## 2021-11-06 MED ORDER — POLYETHYLENE GLYCOL 3350 17 G PO PACK
17.0000 g | PACK | Freq: Every day | ORAL | Status: DC
  Administered 2021-11-08: 17 g via ORAL
  Filled 2021-11-06: qty 1

## 2021-11-06 MED ORDER — ASPIRIN 81 MG PO CHEW
324.0000 mg | CHEWABLE_TABLET | Freq: Once | ORAL | Status: AC
  Administered 2021-11-06: 324 mg via ORAL
  Filled 2021-11-06: qty 4

## 2021-11-06 MED ORDER — ASPIRIN 81 MG PO TBEC
81.0000 mg | DELAYED_RELEASE_TABLET | Freq: Every day | ORAL | Status: DC
Start: 2021-11-07 — End: 2021-11-07
  Administered 2021-11-07: 81 mg via ORAL
  Filled 2021-11-06: qty 1

## 2021-11-06 MED ORDER — CO Q-10 100 MG PO CAPS: 100.0000 mg | ORAL_CAPSULE | Freq: Every day | ORAL | Status: DC

## 2021-11-06 MED ORDER — ROSUVASTATIN CALCIUM 5 MG PO TABS
10.0000 mg | ORAL_TABLET | ORAL | Status: DC
  Administered 2021-11-07: 10 mg via ORAL
  Filled 2021-11-06 (×2): qty 2

## 2021-11-06 MED ORDER — SODIUM CHLORIDE 0.9 % WEIGHT BASED INFUSION: 1.0000 mL/kg/h | INTRAVENOUS | Status: DC

## 2021-11-06 MED ORDER — KETOCONAZOLE 2 % EX CREA: 1.0000 | TOPICAL_CREAM | Freq: Every day | CUTANEOUS | Status: DC

## 2021-11-06 MED ORDER — FLUTICASONE PROPIONATE 50 MCG/ACT NA SUSP
1.0000 | Freq: Every day | NASAL | Status: DC | PRN
  Filled 2021-11-06: qty 16

## 2021-11-06 MED ORDER — ROSUVASTATIN CALCIUM 5 MG PO TABS: 10.0000 mg | ORAL_TABLET | ORAL | Status: DC

## 2021-11-06 MED ORDER — ACETAMINOPHEN 325 MG PO TABS: 650.0000 mg | ORAL_TABLET | ORAL | Status: DC | PRN

## 2021-11-06 MED ORDER — CLOBETASOL PROPIONATE 0.05 % EX FOAM: 1.0000 "application " | CUTANEOUS | Status: DC

## 2021-11-06 MED ORDER — CLONIDINE HCL 0.2 MG PO TABS
0.2000 mg | ORAL_TABLET | Freq: Every day | ORAL | Status: DC
  Administered 2021-11-06 – 2021-11-07 (×2): 0.2 mg via ORAL
  Filled 2021-11-06 (×2): qty 1

## 2021-11-06 MED ORDER — NITROGLYCERIN 0.4 MG SL SUBL: 0.4000 mg | SUBLINGUAL_TABLET | SUBLINGUAL | Status: DC | PRN

## 2021-11-06 MED ORDER — HEPARIN BOLUS VIA INFUSION
3000.0000 [IU] | Freq: Once | INTRAVENOUS | Status: AC
Start: 2021-11-06 — End: 2021-11-06
  Administered 2021-11-06: 3000 [IU] via INTRAVENOUS

## 2021-11-06 MED ORDER — ONDANSETRON HCL 4 MG/2ML IJ SOLN: 4.0000 mg | Freq: Four times a day (QID) | INTRAMUSCULAR | Status: DC | PRN

## 2021-11-06 MED ORDER — SODIUM CHLORIDE 0.9 % IV SOLN: 250.0000 mL | INTRAVENOUS | Status: DC | PRN

## 2021-11-06 MED ORDER — LISINOPRIL 20 MG PO TABS
40.0000 mg | ORAL_TABLET | Freq: Every day | ORAL | Status: DC
  Administered 2021-11-06 – 2021-11-08 (×3): 40 mg via ORAL
  Filled 2021-11-06 (×4): qty 2

## 2021-11-06 MED ORDER — SODIUM CHLORIDE 0.9 % WEIGHT BASED INFUSION
3.0000 mL/kg/h | INTRAVENOUS | Status: DC
  Administered 2021-11-07: 3 mL/kg/h via INTRAVENOUS

## 2021-11-06 MED ORDER — HEPARIN (PORCINE) 25000 UT/250ML-% IV SOLN
700.0000 [IU]/h | INTRAVENOUS | Status: DC
  Administered 2021-11-06: 600 [IU]/h via INTRAVENOUS
  Filled 2021-11-06: qty 250

## 2021-11-06 MED ORDER — ALBUTEROL SULFATE (2.5 MG/3ML) 0.083% IN NEBU
2.5000 mg | INHALATION_SOLUTION | Freq: Two times a day (BID) | RESPIRATORY_TRACT | Status: DC
  Filled 2021-11-06 (×2): qty 3

## 2021-11-06 NOTE — ED Notes (Signed)
Patient transported to CT 

## 2021-11-06 NOTE — Plan of Care (Signed)
  Problem: Education: Goal: Understanding of CV disease, CV risk reduction, and recovery process will improve 11/06/2021 2335 by Robley Fries, RN Outcome: Progressing 11/06/2021 2334 by Robley Fries, RN Outcome: Progressing Goal: Individualized Educational Video(s) 11/06/2021 2335 by Robley Fries, RN Outcome: Progressing 11/06/2021 2334 by Robley Fries, RN Outcome: Progressing   Problem: Activity: Goal: Ability to return to baseline activity level will improve 11/06/2021 2335 by Robley Fries, RN Outcome: Progressing 11/06/2021 2334 by Robley Fries, RN Outcome: Progressing   Problem: Cardiovascular: Goal: Ability to achieve and maintain adequate cardiovascular perfusion will improve 11/06/2021 2335 by Robley Fries, RN Outcome: Progressing 11/06/2021 2334 by Robley Fries, RN Outcome: Progressing Goal: Vascular access site(s) Level 0-1 will be maintained 11/06/2021 2335 by Robley Fries, RN Outcome: Progressing 11/06/2021 2334 by Robley Fries, RN Outcome: Progressing   Problem: Health Behavior/Discharge Planning: Goal: Ability to safely manage health-related needs after discharge will improve 11/06/2021 2335 by Robley Fries, RN Outcome: Progressing 11/06/2021 2334 by Robley Fries, RN Outcome: Progressing   Problem: Education: Goal: Knowledge of General Education information will improve Description: Including pain rating scale, medication(s)/side effects and non-pharmacologic comfort measures Outcome: Progressing   Problem: Health Behavior/Discharge Planning: Goal: Ability to manage health-related needs will improve Outcome: Progressing   Problem: Clinical Measurements: Goal: Ability to maintain clinical measurements within normal limits will improve Outcome: Progressing Goal: Will remain free from infection Outcome: Progressing Goal: Diagnostic test results will improve Outcome: Progressing Goal: Respiratory complications will  improve Outcome: Progressing Goal: Cardiovascular complication will be avoided Outcome: Progressing   Problem: Activity: Goal: Risk for activity intolerance will decrease Outcome: Progressing   Problem: Nutrition: Goal: Adequate nutrition will be maintained Outcome: Progressing   Problem: Coping: Goal: Level of anxiety will decrease Outcome: Progressing   Problem: Elimination: Goal: Will not experience complications related to bowel motility Outcome: Progressing Goal: Will not experience complications related to urinary retention Outcome: Progressing   Problem: Pain Managment: Goal: General experience of comfort will improve Outcome: Progressing   Problem: Safety: Goal: Ability to remain free from injury will improve Outcome: Progressing   Problem: Skin Integrity: Goal: Risk for impaired skin integrity will decrease Outcome: Progressing

## 2021-11-06 NOTE — ED Notes (Signed)
Pt ambulated to bathroom 

## 2021-11-06 NOTE — ED Triage Notes (Signed)
Patient reports to the ER for dizziness. She reports yesterday her BP was high. She states she has a hx of high BP. Patient reports her BP was 210/90's.

## 2021-11-06 NOTE — ED Notes (Signed)
[facility] at bedside 

## 2021-11-06 NOTE — ED Notes (Signed)
Patient transported to X-ray 

## 2021-11-06 NOTE — H&P (Signed)
Cardiology Admission History and Physical:   Patient ID: Kendra [name] MRN: [id number]; DOB: 1936-05-25   Admission date: 11/06/2021  PCP:  Vernie Shanks, MD   [place] [facility] Cardiologist:  [place]{   Chief Complaint:  Chest pain  Patient Profile:   Kendra [name] is a 86 y.o. female with CAD with prior PCAI to LAD who is being seen 11/06/2021 for the evaluation of chest pain.  History of Present Illness:   Kendra [name] 86 yo female history of CAD with prior PCI to LAD per clinic notes ( I don't see cath report, she reports done early [facility]), HTN, carotid stenosis, renal artery stenosis, asthma, HL, multiple medication allergies, presented with dizziness and chest pain.  Episode this morning of 5/10 burning pain left chest without other associated symptoms, lasted a few minutes and self resolved. Afterward had some nonspecific fatigue, dizziness. No recurrence of chest pain.    WBC 12.6 Hgb 14.9 Plt 258 K 4.3 Cr 0.67 BUN 17  Trop 216-->320 CXR no acute process CT head: no acute process EKG sinus brady, no specific ischemic changes  04/2020 echo: LVEF 60-65%, grade I dd, no WMAs Past Medical History:  Diagnosis Date   Asthma    CAD (coronary artery disease)    last cath 08/2006   Carotid artery disease (HCC)    moderate left ICA stenosis by 2.6 ultrasound   DJD (degenerative joint disease), lumbar    Hearing loss    HTN (hypertension)    Hyperlipidemia    Renal artery stenosis ([place]) 05/05/2021   Vision loss of right eye     Past Surgical History:  Procedure Laterality Date   ABDOMINAL HYSTERECTOMY  1975   APPENDECTOMY     CORONARY ANGIOPLASTY WITH STENT PLACEMENT  08/2006   taxus stent x2 to LAD, 2.5x19m and 2.5x84[facility]   Medications Prior to Admission: Prior to Admission medications   Medication Sig Start Date End Date  Taking? Authorizing Provider  albuterol (VENTOLIN HFA) 108 (90 Base) MCG/ACT inhaler Inhale 2 puffs into the lungs every 12 (twelve) hours.    [provider]  Ascorbic Acid (VITAMIN C) 1000 MG tablet Take 1,000 mg by mouth daily.    [provider]  aspirin EC 81 MG tablet Take 81 mg by mouth daily. Swallow whole.    [provider]  beclomethasone (QVAR) 80 MCG/ACT inhaler Inhale 1-2 puffs into the lungs 2 (two) times daily.    [provider]  BIOTIN 5000 PO Take 1 tablet by mouth daily.    [provider]  BLACK ELDERBERRY PO Take 575 mg by mouth daily.    [provider]  calcium carbonate (OS-CAL - DOSED IN MG OF ELEMENTAL CALCIUM) 1250 (500 Ca) MG tablet Take 1 tablet by mouth daily.    [provider]  cholecalciferol (VITAMIN D) 1000 UNITS tablet Take 1,000 Units by mouth daily.    [provider]  clobetasol (OLUX) 0.05 % topical foam Apply 1 application topically every 3 (three) days.    [provider]  cloNIDine (CATAPRES) 0.1 MG tablet TAKE 1 TABLET THREE TIMES A DAY AND 2 TABLETS AT BEDTIME 10/14/21   RaSkeet LatchMD  Coenzyme Q10 (CO Q-10) 100 MG CAPS Take 100 mg by mouth daily.     [provider]  docusate sodium (COLACE) 100  MG capsule Take 100 mg by mouth daily as needed.    [provider]  fluticasone (FLONASE) 50 MCG/ACT nasal spray Place 1 spray into both nostrils daily as needed.    [provider]  Homeopathic Products (ARNICA EX) Apply topically as needed. Sore areas    [provider]  ketoconazole (NIZORAL) 2 % cream Apply 1 application topically daily. Apply to toe    [provider]  lisinopril (ZESTRIL) 40 MG tablet Take 1 tablet (40 mg total) by mouth daily. 09/15/21   Skeet Latch, MD  Multiple Vitamin (MULTIVITAMIN) capsule Take 1 capsule by mouth daily.    [provider]  polyethylene glycol (MIRALAX / GLYCOLAX) 17 g  packet Take 17 g by mouth daily.    [provider]  rosuvastatin (CRESTOR) 10 MG tablet Take 10 mg by mouth every other day.    [provider]  triamcinolone (KENALOG) 0.1 % Apply 1 application topically 2 (two) times daily as needed.    [provider]  Wheat Dextrin (BENEFIBER DRINK MIX PO) Take 1 Dose by mouth daily as needed.    [provider]     Allergies:    Allergies  Allergen Reactions   Chlorthalidone Other (See Comments)    Hyponatremia   Other     Other reaction(s): Respiratory Distress   Amlodipine     "shivers"    Ciprofloxacin    Dog Epithelium Allergy Skin Test    Latex Other (See Comments)    Irritates her skin   Statins     Unable to tolerate statins w the exception of crestor.   Sulfa Antibiotics Other (See Comments)    "didnt feel good"   Sulfamethoxazole Other (See Comments)    Makes her feel bad   Zetia [Ezetimibe] Other (See Comments)    "makes me constipated"   Penicillins Rash    Social History:   Social History   Socioeconomic History   Marital status: Married    Spouse name: Not on file   Number of children: 1   Years of education: MAx2   Highest education level: Not on file  Occupational History   Occupation: Retired   Occupation: Education officer, museum  Tobacco Use   Smoking status: Former    Types: Cigarettes    Quit date: 06/08/1966    Years since quitting: 55.4    Passive exposure: Past   Smokeless tobacco: Never  Vaping Use   Vaping Use: Never used  Substance and Sexual Activity   Alcohol use: Not Currently    Alcohol/week: 1.0 standard drink    Types: 1 Standard drinks or equivalent per week    Comment: wine   Drug use: No   Sexual activity: Yes  Other Topics Concern   Not on file  Social History Narrative   Not on file   Social Determinants of Health   Financial Resource Strain: Not on file  Food Insecurity: Not on file  Transportation Needs: Not on file  Physical Activity: Sufficiently  Active   Days of Exercise per Week: 5 days   Minutes of Exercise per Session: 30 min  Stress: Not on file  Social Connections: Not on file  Intimate Partner Violence: Not on file    Family History:   The patient's family history includes Breast cancer in her maternal grandmother; Cancer in her maternal grandmother and mother; Diabetes in her father; Heart attack in her mother and paternal grandfather; Heart failure in her father; Hyperlipidemia in  her sister.    ROS:  Please see the history of present illness.  All other ROS reviewed and negative.     Physical Exam/Data:   Vitals:   11/06/21 1647 11/06/21 1700 11/06/21 1845 11/06/21 1946  BP: 139/64 (!) 146/96 (!) 129/95   Pulse: (!) 56 (!) 50 74   Resp: '14 16 19 16  '$ Temp:    97.8 F (36.6 C)  TempSrc:    Oral  SpO2: 98% 99% 100%   Weight:      Height:       No intake or output data in the 24 hours ending 11/06/21 2016    11/06/2021    2:44 PM 10/14/2021    4:27 PM 08/01/2021    1:46 PM  Last 3 Weights  Weight (lbs) 117 lb 1 oz 122 lb 122 lb  Weight (kg) 53.1 kg 55.339 kg 55.339 kg     Body mass index is 19.48 kg/m.  General:  Well nourished, well developed, in no acute distress HEENT: normal Neck: no JVD Vascular: No carotid bruits; Distal pulses 2+ bilaterally   Cardiac:  normal S1, S2; RRR; no murmur  Lungs:  clear to auscultation bilaterally, no wheezing, rhonchi or rales  Abd: soft, nontender, no hepatomegaly  Ext: no edema Musculoskeletal:  No deformities, BUE and BLE strength normal and equal Skin: warm and dry  Neuro:  CNs 2-12 intact, no focal abnormalities noted Psych:  Normal affect      Relevant CV Studies:   Laboratory Data:  High Sensitivity Troponin:   Recent Labs  Lab 11/06/21 1525 11/06/21 1655  TROPONINIHS 216* 320*      Chemistry Recent Labs  Lab 11/06/21 1525  NA 134*  K 4.3  CL 99  CO2 26  GLUCOSE 122*  BUN 17  CREATININE 0.67  CALCIUM 9.4  GFRNONAA >60  ANIONGAP 9     Recent Labs  Lab 11/06/21 1525  PROT 7.0  ALBUMIN 4.1  AST 22  ALT 13  ALKPHOS 47  BILITOT 0.8   Lipids No results for input(s): CHOL, TRIG, HDL, LABVLDL, LDLCALC, CHOLHDL in the last 168 hours. Hematology Recent Labs  Lab 11/06/21 1525  WBC 12.6*  RBC 5.13*  HGB 14.9  HCT 44.3  MCV 86.4  MCH 29.0  MCHC 33.6  RDW 13.6  PLT 258   Thyroid No results for input(s): TSH, FREET4 in the last 168 hours. BNPNo results for input(s): BNP, PROBNP in the last 168 hours.  DDimer No results for input(s): DDIMER in the last 168 hours.   Radiology/Studies:  DG Chest 2 View  Result Date: 11/06/2021 CLINICAL DATA:  Chest pain. EXAM: CHEST - 2 VIEW COMPARISON:  None Available. FINDINGS: The heart size and mediastinal contours are within normal limits. Both lungs are clear. The visualized skeletal structures are unremarkable. IMPRESSION: No active cardiopulmonary disease. Electronically Signed   By: Marijo Conception M.D.   On: 11/06/2021 15:23   CT Head Wo Contrast  Result Date: 11/06/2021 CLINICAL DATA:  Dizziness, vertigo EXAM: CT HEAD WITHOUT CONTRAST TECHNIQUE: Contiguous axial images were obtained from the base of the skull through the vertex without intravenous contrast. RADIATION DOSE REDUCTION: This exam was performed according to the departmental dose-optimization program which includes automated exposure control, adjustment of the mA and/or kV according to patient size and/or use of iterative reconstruction technique. COMPARISON:  None Available. FINDINGS: Brain: No evidence of acute infarction, hemorrhage, hydrocephalus, extra-axial collection or mass lesion/mass effect. Cerebral cortical volume loss  consistent with atrophy. Vascular: No hyperdense vessel or unexpected calcification. Skull: Normal. Negative for fracture or focal lesion. Sinuses/Orbits: Chronic opacification of the left maxillary sinus is partially imaged. Other: None. IMPRESSION: No acute intracranial abnormality. Stable  atrophy and chronic opacification of the left maxillary sinus. Electronically Signed   By: Jacqulynn Cadet M.D.   On: 11/06/2021 15:47     Assessment and Plan:   1.NSTEMI/History of CAD - history of CAD with prior PCI to LAD per clinic notes, she reports done [facility], I do not see cath report - trop up to 320 without peak, EKG without acute ischemic changes - medical therapy with hep gtt, ASA 81, crestor '10mg'$  every other day (difficultly tolerating statin), lisinopril '40mg'$  daily. Continue home regimen, multiple medication alleriges over time.  - plan for cath tomorrow.     2. HTN - followed in HTN clinic. Multiple medication side effects has effected management - will continue home regimen that has been outined by HTN clinic     Shared Decision Making/Informed Consent The risks [stroke (1 in 1000), death (1 in 1000), kidney failure [usually temporary] (1 in 500), bleeding (1 in 200), allergic reaction [possibly serious] (1 in 200)], benefits (diagnostic support and management of coronary artery disease) and alternatives of a cardiac catheterization were discussed in detail with Kendra [name] and she is willing to proceed.     Risk Assessment/Risk Scores:    TIMI Risk Score for Unstable Angina or Non-ST Elevation MI:   The patient's TIMI risk score is 5, which indicates a 26% risk of all cause mortality, new or recurrent myocardial infarction or need for urgent revascularization in the next 14 days.{    Severity of Illness: The appropriate patient status for this patient is INPATIENT. Inpatient status is judged to be reasonable and necessary in order to provide the required intensity of service to ensure the patient's safety. The patient's presenting symptoms, physical exam findings, and initial radiographic and laboratory data in the context of their chronic comorbidities is felt to place them at high risk for further clinical deterioration. Furthermore, it is not  anticipated that the patient will be medically stable for discharge from the hospital within 2 midnights of admission.   * I certify that at the point of admission it is my clinical judgment that the patient will require inpatient hospital care spanning beyond 2 midnights from the point of admission due to high intensity of service, high risk for further deterioration and high frequency of surveillance required.*   For questions or updates, please contact [place] Please consult [email or web address] for contact info under     Signed, Carlyle Dolly, MD  11/06/2021 8:16 PM

## 2021-11-06 NOTE — ED Provider Notes (Signed)
[facility] EMERGENCY DEPT Provider Note   CSN: [id number] Arrival date & time: 11/06/21  1430     History  Chief Complaint  Patient presents with   Dizziness    Kendra [name] is a 86 y.o. female presenting to the ED with a chief complaint of dizziness, lightheadedness, chest discomfort/burning.  Symptoms began when she woke up this morning.  She experienced the chest burning sensation yesterday.  This improved without intervention.  States that she went to sleep last night in her usual state of health.  Woke up around 8 AM with the symptoms.  Reports feeling symptoms when she ambulates.  Denies any headache, blurry vision, numbness in arms or legs, weakness in arms or legs, changes to gait. No shortness of breath that is unusual for her.   Dizziness Associated symptoms: no blood in stool, no chest pain, no diarrhea, no nausea, no palpitations, no shortness of breath, no vomiting and no weakness       Home Medications Prior to Admission medications   Medication Sig Start Date End Date Taking? Authorizing Provider  albuterol (VENTOLIN HFA) 108 (90 Base) MCG/ACT inhaler Inhale 2 puffs into the lungs every 12 (twelve) hours.    [provider]  Ascorbic Acid (VITAMIN C) 1000 MG tablet Take 1,000 mg by mouth daily.    [provider]  aspirin EC 81 MG tablet Take 81 mg by mouth daily. Swallow whole.    [provider]  beclomethasone (QVAR) 80 MCG/ACT inhaler Inhale 1-2 puffs into the lungs 2 (two) times daily.    [provider]  BIOTIN 5000 PO Take 1 tablet by mouth daily.    [provider]  BLACK ELDERBERRY PO Take 575 mg by mouth daily.    [provider]  calcium carbonate (OS-CAL - DOSED IN MG OF ELEMENTAL CALCIUM) 1250 (500 Ca) MG tablet Take 1 tablet by mouth daily.    [provider]  cholecalciferol (VITAMIN D) 1000 UNITS tablet Take 1,000 Units by mouth daily.    [provider]   clobetasol (OLUX) 0.05 % topical foam Apply 1 application topically every 3 (three) days.    [provider]  cloNIDine (CATAPRES) 0.1 MG tablet TAKE 1 TABLET THREE TIMES A DAY AND 2 TABLETS AT BEDTIME 10/14/21   Skeet Latch, MD  Coenzyme Q10 (CO Q-10) 100 MG CAPS Take 100 mg by mouth daily.     [provider]  docusate sodium (COLACE) 100 MG capsule Take 100 mg by mouth daily as needed.    [provider]  fluticasone (FLONASE) 50 MCG/ACT nasal spray Place 1 spray into both nostrils daily as needed.    [provider]  Homeopathic Products (ARNICA EX) Apply topically as needed. Sore areas    [provider]  ketoconazole (NIZORAL) 2 % cream Apply 1 application topically daily. Apply to toe    [provider]  lisinopril (ZESTRIL) 40 MG tablet Take 1 tablet (40 mg total) by mouth daily. 09/15/21   Skeet Latch, MD  Multiple Vitamin (MULTIVITAMIN) capsule Take 1 capsule by mouth daily.    [provider]  polyethylene glycol (MIRALAX / GLYCOLAX) 17 g packet Take 17 g by mouth daily.    [provider]  rosuvastatin (CRESTOR) 10 MG tablet Take 10 mg by mouth every other day.    [provider]  triamcinolone (KENALOG) 0.1 % Apply 1 application topically 2 (two) times daily as needed.    [provider]  Wheat Dextrin (BENEFIBER DRINK MIX PO) Take 1 Dose by mouth daily as needed.    [provider]      Allergies    Chlorthalidone, Other, Amlodipine, Ciprofloxacin, Dog epithelium allergy skin test, Latex, Statins, Sulfa antibiotics, Sulfamethoxazole, Zetia [ezetimibe], and Penicillins    Review of Systems   Review of Systems  Constitutional:  Negative for appetite change, chills and fever.  HENT:  Negative for ear pain, rhinorrhea, sneezing and sore throat.   Eyes:  Negative for photophobia, pain and visual disturbance.  Respiratory:  Negative for cough, chest tightness, shortness of  breath and wheezing.   Cardiovascular:  Negative for chest pain and palpitations.  Gastrointestinal:  Negative for abdominal pain, blood in stool, constipation, diarrhea, nausea and vomiting.  Genitourinary:  Negative for dysuria, hematuria and urgency.  Musculoskeletal:  Negative for arthralgias, back pain and myalgias.  Skin:  Negative for color change and rash.  Neurological:  Positive for dizziness and light-headedness. Negative for seizures, syncope and weakness.  All other systems reviewed and are negative.  Physical Exam Updated Vital Signs BP 139/64   Pulse (!) 56   Temp 97.6 F (36.4 C) (Oral)   Resp 14   Ht '5\' 5"'$  (1.651 m)   Wt 53.1 kg   SpO2 98%   BMI 19.48 kg/m  Physical Exam Vitals and nursing note reviewed.  Constitutional:      General: She is not in acute distress.    Appearance: She is well-developed.  HENT:     Head: Normocephalic and atraumatic.     Nose: Nose normal.  Eyes:     General: No scleral icterus.       Right eye: No discharge.        Left eye: No discharge.     Conjunctiva/sclera: Conjunctivae normal.     Pupils: Pupils are equal, round, and reactive to light.  Cardiovascular:     Rate and Rhythm: Normal rate and regular rhythm.     Heart sounds: Normal heart sounds. No murmur heard.   No friction rub. No gallop.  Pulmonary:     Effort: Pulmonary effort is normal. No respiratory distress.     Breath sounds: Normal breath sounds.  Abdominal:     General: Bowel sounds are normal. There is no distension.     Palpations: Abdomen is soft.     Tenderness: There is no abdominal tenderness. There is no guarding.  Musculoskeletal:        General: Normal range of motion.     Cervical back: Normal range of motion and neck supple.  Skin:    General: Skin is warm and dry.     Findings: No rash.  Neurological:     Mental Status: She is alert and oriented to person, place, and time.     Cranial Nerves: No cranial nerve deficit.     Sensory: No  sensory deficit.     Motor: No weakness or abnormal muscle tone.     Coordination: Coordination normal.     Comments: Pupils reactive. No facial asymmetry noted. Cranial nerves appear grossly intact. Sensation intact to light touch on face, BUE and BLE. Strength 5/5 in BUE and BLE. Normal coordination.  Normal speech.  No aphasia    ED Results / Procedures / Treatments   Labs (all labs ordered are listed, but only abnormal results are displayed) Labs Reviewed  CBC - Abnormal; Notable for the following components:      Result Value   WBC  12.6 (*)    RBC 5.13 (*)    All other components within normal limits  COMPREHENSIVE METABOLIC PANEL - Abnormal; Notable for the following components:   Sodium 134 (*)    Glucose, Bld 122 (*)    All other components within normal limits  CBG MONITORING, ED - Abnormal; Notable for the following components:   Glucose-Capillary 116 (*)    All other components within normal limits  TROPONIN I (HIGH SENSITIVITY) - Abnormal; Notable for the following components:   Troponin I (High Sensitivity) 216 (*)    All other components within normal limits  LIPASE, BLOOD  HEPARIN LEVEL (UNFRACTIONATED)  HEPARIN LEVEL (UNFRACTIONATED)  CBC  TROPONIN I (HIGH SENSITIVITY)    EKG EKG Interpretation  Date/Time:  Thursday November 06 2021 14:54:56 EDT Ventricular Rate:  47 PR Interval:  174 QRS Duration: 97 QT Interval:  436 QTC Calculation: 386 R Axis:   37 Text Interpretation: Sinus bradycardia Atrial premature complex No significant change since last tracing Confirmed by Gareth Morgan [phone]) on 11/06/2021 4:22:03 PM  Radiology DG Chest 2 View  Result Date: 11/06/2021 CLINICAL DATA:  Chest pain. EXAM: CHEST - 2 VIEW COMPARISON:  None Available. FINDINGS: The heart size and mediastinal contours are within normal limits. Both lungs are clear. The visualized skeletal structures are unremarkable. IMPRESSION: No active cardiopulmonary disease. Electronically Signed    By: Marijo Conception M.D.   On: 11/06/2021 15:23   CT Head Wo Contrast  Result Date: 11/06/2021 CLINICAL DATA:  Dizziness, vertigo EXAM: CT HEAD WITHOUT CONTRAST TECHNIQUE: Contiguous axial images were obtained from the base of the skull through the vertex without intravenous contrast. RADIATION DOSE REDUCTION: This exam was performed according to the departmental dose-optimization program which includes automated exposure control, adjustment of the mA and/or kV according to patient size and/or use of iterative reconstruction technique. COMPARISON:  None Available. FINDINGS: Brain: No evidence of acute infarction, hemorrhage, hydrocephalus, extra-axial collection or mass lesion/mass effect. Cerebral cortical volume loss consistent with atrophy. Vascular: No hyperdense vessel or unexpected calcification. Skull: Normal. Negative for fracture or focal lesion. Sinuses/Orbits: Chronic opacification of the left maxillary sinus is partially imaged. Other: None. IMPRESSION: No acute intracranial abnormality. Stable atrophy and chronic opacification of the left maxillary sinus. Electronically Signed   By: Jacqulynn Cadet M.D.   On: 11/06/2021 15:47    Procedures .Critical Care Performed by: Delia Heady, PA-C Authorized by: Delia Heady, PA-C   Critical care provider statement:    Critical care time (minutes):  35   Critical care time was exclusive of:  Separately billable procedures and treating other patients and teaching time   Critical care was necessary to treat or prevent imminent or life-threatening deterioration of the following conditions:  Cardiac failure, circulatory failure and CNS failure or compromise   Critical care was time spent personally by me on the following activities:  Development of treatment plan with patient or surrogate, discussions with consultants, evaluation of patient's response to treatment, examination of patient, ordering and performing treatments and interventions, ordering  and review of laboratory studies, ordering and review of radiographic studies, re-evaluation of patient's condition and review of old charts   I assumed direction of critical care for this patient from another provider in my specialty: no     Care discussed with: admitting provider      Medications Ordered in ED Medications  heparin ADULT infusion 100 units/mL (25000 units/244m) (600 Units/hr Intravenous New Bag/Given 11/06/21 1711)  aspirin chewable tablet 324  mg (324 mg Oral Given 11/06/21 1641)  heparin bolus via infusion 3,000 Units (3,000 Units Intravenous Bolus from Bag 11/06/21 1712)    ED Course/ Medical Decision Making/ A&P Clinical Course as of 11/06/21 1729  Thu Nov 06, 2021  1623 Troponin I (High Sensitivity)(!!): 216 [HK]  1642 Consult with Dr. Debara Pickett, cardiologist.  Accepts the patient for admission with a telemetry bed [HK]    Clinical Course User Index [HK] Delia Heady, PA-C                           Medical Decision Making Amount and/or Complexity of Data Reviewed Labs: ordered. Decision-making details documented in ED Course. Radiology: ordered.  Risk OTC drugs. Prescription drug management. Decision regarding hospitalization.   86 year old female presenting to the ED with a chief complaint of dizziness, lightheadedness, chest discomfort.  Experience some burning chest discomfort radiating up to her jaw yesterday but improved without intervention.  This morning she woke up feeling lightheaded and dizzy.  She again had an episode of burning sensation in her chest and pain in her jaw.  She has been compliant with all of her medications.  Recently switched from irbesartan to lisinopril for her blood pressure.  Also takes clonidine for her blood pressure.  Denies any headache, blurry vision, numbness in arms or legs, shortness of breath that is unusual for her.  On exam patient without neurological deficits.  No numbness or weakness on exam.  Lungs are clear to auscultation  bilaterally.  No lower extremity edema, erythema or calf tenderness bilaterally.  Normal speech.  EKG shows sinus bradycardia, no changes from prior tracings.  Chest x-ray is unremarkable.  CT of the head showing no acute findings.  CBG unremarkable.  CMP and CBC unremarkable, slight leukocytosis of 12.6 which I feel is reactive to her symptoms.  She has a troponin of 216.  I suspect this is in the setting of ACS rather than dissection, PE based on her symptoms.  I have consulted cardiology, Dr. Debara Pickett who accepts the patient for admission.  I have placed temporary admission orders for this patient to be admitted to [facility].  We have started her on aspirin and heparin per pharmacy protocol.  Patient does not appear to have any contraindications to starting heparin.  She was updated on plan and remains hemodynamically stable.  Patient discussed with and seen by the attending, Dr. Billy Fischer    Portions of this note were generated with Dragon dictation software. Dictation errors may occur despite best attempts at proofreading.         Final Clinical Impression(s) / ED Diagnoses Final diagnoses:  NSTEMI (non-ST elevated myocardial infarction) [facility])    Rx / DC Orders ED Discharge Orders     None         Delia Heady, PA-C 11/06/21 1729    Gareth Morgan, MD 11/07/21 [phone]

## 2021-11-06 NOTE — Progress Notes (Signed)
ANTICOAGULATION CONSULT NOTE - Initial Consult  Pharmacy Consult for IV Heparin Indication: chest pain/ACS  Allergies  Allergen Reactions   Chlorthalidone Other (See Comments)    Hyponatremia   Other     Other reaction(s): Respiratory Distress   Amlodipine     "shivers"    Ciprofloxacin    Dog Epithelium Allergy Skin Test    Latex Other (See Comments)    Irritates her skin   Statins     Unable to tolerate statins w the exception of crestor.   Sulfa Antibiotics Other (See Comments)    "didnt feel good"   Sulfamethoxazole Other (See Comments)    Makes her feel bad   Zetia [Ezetimibe] Other (See Comments)    "makes me constipated"   Penicillins Rash    Patient Measurements: Height: '5\' 5"'$  (165.1 cm) Weight: 53.1 kg (117 lb 1 oz) IBW/kg (Calculated) : 57 Heparin Dosing Weight: 53.1 kg   Vital Signs: Temp: 97.6 F (36.4 C) (06/01 1450) Temp Source: Oral (06/01 1450) BP: 123/56 (06/01 1515) Pulse Rate: 47 (06/01 1515)  Labs: Recent Labs    11/06/21 1525  HGB 14.9  HCT 44.3  PLT 258  CREATININE 0.67  TROPONINIHS 216*    Estimated Creatinine Clearance: 44.7 mL/min (by C-G formula based on SCr of 0.67 mg/dL).   Medical History: Past Medical History:  Diagnosis Date   Asthma    CAD (coronary artery disease)    last cath 08/2006   Carotid artery disease (HCC)    moderate left ICA stenosis by 2.6 ultrasound   DJD (degenerative joint disease), lumbar    Hearing loss    HTN (hypertension)    Hyperlipidemia    Renal artery stenosis ([place]) 05/05/2021   Vision loss of right eye      Assessment: Patient presented to [facility] ED c/o chest discomfort and tightness in her JA. Pharmacy consulted to dose heparin for ACS/STEMI.   No anticoagulation PTA.    Goal of Therapy:  Heparin level 0.3-0.7 units/ml Monitor platelets by anticoagulation protocol: Yes   Plan:  BOLUS Heparin 3,000 units x1  START Heparin infusion at 600 units/hour  F/U 8-hour heparin  level and daily while on heparin, daily CBC  Monitor for signs/sx of bleeding    Adria Dill, PharmD PGY-1 Acute Care Resident  11/06/2021 4:48 PM

## 2021-11-06 NOTE — ED Notes (Signed)
Patient reports she is experiencing some chest discomfort and tightness in her jaw. PA at bedside aware

## 2021-11-06 NOTE — ED Notes (Signed)
Provider at bedside

## 2021-11-07 ENCOUNTER — Encounter (HOSPITAL_COMMUNITY)
Admission: EM | Disposition: A | Discharge status: Home / Self Care | Payer: Self-pay | Source: Home / Self Care | Attending: Cardiology

## 2021-11-07 ENCOUNTER — Other Ambulatory Visit (HOSPITAL_COMMUNITY): Payer: Self-pay

## 2021-11-07 ENCOUNTER — Inpatient Hospital Stay (HOSPITAL_COMMUNITY): Payer: Medicare Other

## 2021-11-07 ENCOUNTER — Other Ambulatory Visit: Payer: Self-pay

## 2021-11-07 DIAGNOSIS — I214 Non-ST elevation (NSTEMI) myocardial infarction: Secondary | ICD-10-CM | POA: Diagnosis not present

## 2021-11-07 DIAGNOSIS — R079 Chest pain, unspecified: Secondary | ICD-10-CM

## 2021-11-07 DIAGNOSIS — I2510 Atherosclerotic heart disease of native coronary artery without angina pectoris: Secondary | ICD-10-CM

## 2021-11-07 HISTORY — PX: LEFT HEART CATH AND CORONARY ANGIOGRAPHY: CATH118249

## 2021-11-07 HISTORY — PX: INTRAVASCULAR PRESSURE WIRE/FFR STUDY: CATH118243

## 2021-11-07 LAB — CBC
HCT: 39.7 % (ref 36.0–46.0)
Hemoglobin: 13.6 g/dL (ref 12.0–15.0)
MCH: 29.8 pg (ref 26.0–34.0)
MCHC: 34.3 g/dL (ref 30.0–36.0)
MCV: 87.1 fL (ref 80.0–100.0)
Platelets: 229 10*3/uL (ref 150–400)
RBC: 4.56 MIL/uL (ref 3.87–5.11)
RDW: 13.6 % (ref 11.5–15.5)
WBC: 8.6 10*3/uL (ref 4.0–10.5)
nRBC: 0 % (ref 0.0–0.2)

## 2021-11-07 LAB — LIPID PANEL
Cholesterol: 132 mg/dL (ref 0–200)
HDL: 64 mg/dL (ref >40)
LDL Cholesterol: 63 mg/dL (ref 0–99)
Total CHOL/HDL Ratio: 2.1 RATIO
Triglycerides: 27 mg/dL (ref <150)
VLDL: 5 mg/dL (ref 0–40)

## 2021-11-07 LAB — BASIC METABOLIC PANEL
Anion gap: 7 (ref 5–15)
BUN: 15 mg/dL (ref 8–23)
CO2: 26 mmol/L (ref 22–32)
Calcium: 8.8 mg/dL — ABNORMAL LOW (ref 8.9–10.3)
Chloride: 103 mmol/L (ref 98–111)
Creatinine, Ser: 0.78 mg/dL (ref 0.44–1.00)
GFR, Estimated: >60 mL/min (ref >60)
Glucose, Bld: 106 mg/dL — ABNORMAL HIGH (ref 70–99)
Potassium: 3.7 mmol/L (ref 3.5–5.1)
Sodium: 136 mmol/L (ref 135–145)

## 2021-11-07 LAB — ECHOCARDIOGRAM COMPLETE
Area-P 1/2: 3.12 cm2
Calc EF: 67.3 %
Height: 65 in
MV VTI: 1.57 cm2
S' Lateral: 2.9 cm
Single Plane A2C EF: 63.6 %
Single Plane A4C EF: 69.3 %
Weight: 1873.03 oz

## 2021-11-07 LAB — POCT ACTIVATED CLOTTING TIME
Activated Clotting Time: 251 seconds
Activated Clotting Time: 420 seconds

## 2021-11-07 LAB — HEPARIN LEVEL (UNFRACTIONATED)
Heparin Unfractionated: 0.28 IU/mL — ABNORMAL LOW (ref 0.30–0.70)
Heparin Unfractionated: 0.31 IU/mL (ref 0.30–0.70)

## 2021-11-07 LAB — TROPONIN I (HIGH SENSITIVITY): Troponin I (High Sensitivity): 558 ng/L (ref <18)

## 2021-11-07 SURGERY — LEFT HEART CATH AND CORONARY ANGIOGRAPHY
Anesthesia: LOCAL

## 2021-11-07 MED ORDER — SODIUM CHLORIDE 0.9% FLUSH
3.0000 mL | Freq: Two times a day (BID) | INTRAVENOUS | Status: DC
  Administered 2021-11-08: 3 mL via INTRAVENOUS

## 2021-11-07 MED ORDER — IOHEXOL 350 MG/ML SOLN
INTRAVENOUS | Status: DC | PRN
  Administered 2021-11-07: 80 mL

## 2021-11-07 MED ORDER — HYDRALAZINE HCL 20 MG/ML IJ SOLN
10.0000 mg | INTRAMUSCULAR | Status: AC | PRN
  Filled 2021-11-07: qty 1

## 2021-11-07 MED ORDER — HEPARIN (PORCINE) IN NACL 1000-0.9 UT/500ML-% IV SOLN
INTRAVENOUS | Status: DC | PRN
  Administered 2021-11-07 (×2): 500 mL

## 2021-11-07 MED ORDER — MIDAZOLAM HCL 2 MG/2ML IJ SOLN
INTRAMUSCULAR | Status: AC
  Filled 2021-11-07: qty 2

## 2021-11-07 MED ORDER — MIDAZOLAM HCL 2 MG/2ML IJ SOLN
INTRAMUSCULAR | Status: DC | PRN
  Administered 2021-11-07 (×3): 1 mg via INTRAVENOUS

## 2021-11-07 MED ORDER — SODIUM CHLORIDE 0.9 % IV SOLN: 250.0000 mL | INTRAVENOUS | Status: DC | PRN

## 2021-11-07 MED ORDER — TICAGRELOR 90 MG PO TABS
ORAL_TABLET | ORAL | Status: AC
  Filled 2021-11-07: qty 2

## 2021-11-07 MED ORDER — HEPARIN (PORCINE) IN NACL 1000-0.9 UT/500ML-% IV SOLN
INTRAVENOUS | Status: AC
  Filled 2021-11-07: qty 1000

## 2021-11-07 MED ORDER — LIDOCAINE HCL (PF) 1 % IJ SOLN
INTRAMUSCULAR | Status: AC
  Filled 2021-11-07: qty 30

## 2021-11-07 MED ORDER — NITROGLYCERIN 1 MG/10 ML FOR IR/CATH LAB
INTRA_ARTERIAL | Status: AC
  Filled 2021-11-07: qty 10

## 2021-11-07 MED ORDER — HEPARIN SODIUM (PORCINE) 1000 UNIT/ML IJ SOLN
INTRAMUSCULAR | Status: DC | PRN
  Administered 2021-11-07: 5000 [IU] via INTRAVENOUS
  Administered 2021-11-07: 1000 [IU] via INTRAVENOUS

## 2021-11-07 MED ORDER — VERAPAMIL HCL 2.5 MG/ML IV SOLN
INTRAVENOUS | Status: DC | PRN
  Administered 2021-11-07: 10 mL via INTRA_ARTERIAL

## 2021-11-07 MED ORDER — TICAGRELOR 90 MG PO TABS
ORAL_TABLET | ORAL | Status: DC | PRN
  Administered 2021-11-07: 180 mg via ORAL

## 2021-11-07 MED ORDER — TICAGRELOR 90 MG PO TABS
90.0000 mg | ORAL_TABLET | Freq: Two times a day (BID) | ORAL | Status: DC
  Administered 2021-11-07 – 2021-11-08 (×2): 90 mg via ORAL
  Filled 2021-11-07 (×2): qty 1

## 2021-11-07 MED ORDER — ONDANSETRON HCL 4 MG/2ML IJ SOLN: 4.0000 mg | Freq: Four times a day (QID) | INTRAMUSCULAR | Status: DC | PRN

## 2021-11-07 MED ORDER — SODIUM CHLORIDE 0.9 % IV SOLN: INTRAVENOUS | Status: AC

## 2021-11-07 MED ORDER — VERAPAMIL HCL 2.5 MG/ML IV SOLN
INTRAVENOUS | Status: AC
  Filled 2021-11-07: qty 2

## 2021-11-07 MED ORDER — FENTANYL CITRATE (PF) 100 MCG/2ML IJ SOLN
INTRAMUSCULAR | Status: AC
  Filled 2021-11-07: qty 2

## 2021-11-07 MED ORDER — HEPARIN SODIUM (PORCINE) 1000 UNIT/ML IJ SOLN
INTRAMUSCULAR | Status: AC
  Filled 2021-11-07: qty 10

## 2021-11-07 MED ORDER — LIDOCAINE HCL (PF) 1 % IJ SOLN
INTRAMUSCULAR | Status: DC | PRN
  Administered 2021-11-07: 2 mL via INTRADERMAL

## 2021-11-07 MED ORDER — FENTANYL CITRATE (PF) 100 MCG/2ML IJ SOLN
INTRAMUSCULAR | Status: DC | PRN
Start: 2021-11-07 — End: 2021-11-07
  Administered 2021-11-07 (×3): 25 ug via INTRAVENOUS

## 2021-11-07 MED ORDER — HYDRALAZINE HCL 20 MG/ML IJ SOLN: 10.0000 mg | INTRAMUSCULAR | Status: DC | PRN

## 2021-11-07 MED ORDER — SODIUM CHLORIDE 0.9% FLUSH: 3.0000 mL | INTRAVENOUS | Status: DC | PRN

## 2021-11-07 MED ORDER — LABETALOL HCL 5 MG/ML IV SOLN: 10.0000 mg | INTRAVENOUS | Status: AC | PRN

## 2021-11-07 MED ORDER — ACETAMINOPHEN 325 MG PO TABS
650.0000 mg | ORAL_TABLET | ORAL | Status: DC | PRN
Start: 2021-11-07 — End: 2021-11-08

## 2021-11-07 MED ORDER — ASPIRIN 81 MG PO CHEW
81.0000 mg | CHEWABLE_TABLET | Freq: Every day | ORAL | Status: DC
  Administered 2021-11-08: 81 mg via ORAL
  Filled 2021-11-07: qty 1

## 2021-11-07 SURGICAL SUPPLY — 18 items
BALLN SCOREFLEX 2.50X10 (BALLOONS) ×2
BALLOON SCOREFLEX 2.50X10 (BALLOONS) IMPLANT
BAND ZEPHYR COMPRESS 30 LONG (HEMOSTASIS) ×1 IMPLANT
CATH DIAG 6FR JR4 (CATHETERS) ×1 IMPLANT
CATH DIAG 6FR PIGTAIL ANGLED (CATHETERS) ×1 IMPLANT
CATH DRAGONFLY OPTIS 2.7FR (CATHETERS) ×1 IMPLANT
CATH INFINITI 6F FL3.5 (CATHETERS) ×1 IMPLANT
CATH LAUNCHER 6FR EBU3.5 (CATHETERS) ×1 IMPLANT
GLIDESHEATH SLEND SS 6F .021 (SHEATH) ×1 IMPLANT
GUIDEWIRE INQWIRE 1.5J.035X260 (WIRE) IMPLANT
GUIDEWIRE PRESSURE X 175 (WIRE) ×1 IMPLANT
INQWIRE 1.5J .035X260CM (WIRE) ×4
KIT ENCORE 26 ADVANTAGE (KITS) ×1 IMPLANT
KIT HEART LEFT (KITS) ×2 IMPLANT
PACK CARDIAC CATHETERIZATION (CUSTOM PROCEDURE TRAY) ×2 IMPLANT
SYR MEDRAD MARK 7 150ML (SYRINGE) ×2 IMPLANT
TRANSDUCER W/STOPCOCK (MISCELLANEOUS) ×2 IMPLANT
TUBING CIL FLEX 10 FLL-RA (TUBING) ×2 IMPLANT

## 2021-11-07 NOTE — Progress Notes (Signed)
[place] for IV Heparin Indication: chest pain/ACS  Allergies  Allergen Reactions   Chlorthalidone Other (See Comments)    Hyponatremia   Other     Other reaction(s): Respiratory Distress   Amlodipine     "shivers"    Ciprofloxacin    Dog Epithelium Allergy Skin Test    Latex Other (See Comments)    Irritates her skin   Statins     Unable to tolerate statins w the exception of crestor.   Sulfa Antibiotics Other (See Comments)    "didnt feel good"   Sulfamethoxazole Other (See Comments)    Makes her feel bad   Zetia [Ezetimibe] Other (See Comments)    "makes me constipated"   Penicillins Rash    Patient Measurements: Height: '5\' 5"'$  (165.1 cm) Weight: 53.1 kg (117 lb 1 oz) IBW/kg (Calculated) : 57 Heparin Dosing Weight: 53.1 kg   Vital Signs: Temp: 98.2 F (36.8 C) (06/02 0230) Temp Source: Oral (06/01 2337) BP: 137/63 (06/02 0230) Pulse Rate: 48 (06/02 0230)  Labs: Recent Labs    11/06/21 1525 11/06/21 1655 11/07/21 0158  HGB 14.9  --  13.6  HCT 44.3  --  39.7  PLT 258  --  229  HEPARINUNFRC  --   --  0.28*  CREATININE 0.67  --   --   TROPONINIHS 216* 320*  --      Estimated Creatinine Clearance: 44.7 mL/min (by C-G formula based on SCr of 0.67 mg/dL).   Medical History: Past Medical History:  Diagnosis Date   Asthma    CAD (coronary artery disease)    last cath 08/2006   Carotid artery disease (HCC)    moderate left ICA stenosis by 2.6 ultrasound   DJD (degenerative joint disease), lumbar    Hearing loss    HTN (hypertension)    Hyperlipidemia    Renal artery stenosis ([place]) 05/05/2021   Vision loss of right eye      Assessment: Patient presented to [facility] ED c/o chest discomfort and tightness. Pharmacy consulted to dose heparin for ACS/STEMI.   No anticoagulation PTA.   6/2 AM update:  Heparin level below goal   Goal of Therapy:  Heparin level 0.3-0.7 units/ml Monitor platelets by  anticoagulation protocol: Yes   Plan:  Inc heparin to 700 units/hr Re-check heparin level in 8 hours  Narda Bonds, PharmD, [place] Pharmacist Phone: [phone]

## 2021-11-07 NOTE — Interval H&P Note (Signed)
History and Physical Interval Note:  11/07/2021 12:43 PM  Kendra [name]  has presented today for surgery, with the diagnosis of NSTEMI.  The various methods of treatment have been discussed with the patient and family. After consideration of risks, benefits and other options for treatment, the patient has consented to  Procedure(s): LEFT HEART CATH AND CORONARY ANGIOGRAPHY (N/A) as a surgical intervention.  The patient's history has been reviewed, patient examined, no change in status, stable for surgery.  I have reviewed the patient's chart and labs.  Questions were answered to the patient's satisfaction.    Cath Lab Visit (complete for each Cath Lab visit)  Clinical Evaluation Leading to the Procedure:   ACS: Yes.    Non-ACS:    Anginal Classification: CCS IV  Anti-ischemic medical therapy: Maximal Therapy (2 or more classes of medications)  Non-Invasive Test Results: No non-invasive testing performed  Prior CABG: No previous CABG        [place]

## 2021-11-07 NOTE — Progress Notes (Signed)
  Echocardiogram 2D Echocardiogram has been performed.  Kendra [name] 11/07/2021, 11:32 AM

## 2021-11-07 NOTE — Progress Notes (Signed)
[place] for IV Heparin Indication: chest pain/ACS  Allergies  Allergen Reactions   Chlorthalidone Other (See Comments)    Hyponatremia   Other     Other reaction(s): Respiratory Distress   Amlodipine     "shivers"    Ciprofloxacin Other (See Comments)    UNK reaction   Dog Epithelium Allergy Skin Test    Latex Other (See Comments)    Irritates her skin   Statins     Unable to tolerate statins w the exception of crestor.   Sulfa Antibiotics Other (See Comments)    "didnt feel good"   Sulfamethoxazole Other (See Comments)    Makes her feel bad   Zetia [Ezetimibe] Other (See Comments)    "makes me constipated"   Penicillins Rash    Patient Measurements: Height: '5\' 5"'$  (165.1 cm) Weight: 53.1 kg (117 lb 1 oz) IBW/kg (Calculated) : 57 Heparin Dosing Weight: 53.1 kg   Vital Signs: Temp: 97.7 F (36.5 C) (06/02 1241) Temp Source: Oral (06/02 1241) BP: 198/77 (06/02 0929) Pulse Rate: 46 (06/02 0545)  Labs: Recent Labs    11/06/21 1525 11/06/21 1655 11/07/21 0158 11/07/21 0620 11/07/21 1154  HGB 14.9  --  13.6  --   --   HCT 44.3  --  39.7  --   --   PLT 258  --  229  --   --   HEPARINUNFRC  --   --  0.28*  --  0.31  CREATININE 0.67  --  0.78  --   --   TROPONINIHS 216* 320*  --  558*  --      Estimated Creatinine Clearance: 44.7 mL/min (by C-G formula based on SCr of 0.78 mg/dL).   Medical History: Past Medical History:  Diagnosis Date   Asthma    CAD (coronary artery disease)    last cath 08/2006   Carotid artery disease (HCC)    moderate left ICA stenosis by 2.6 ultrasound   DJD (degenerative joint disease), lumbar    Hearing loss    HTN (hypertension)    Hyperlipidemia    Renal artery stenosis ([place]) 05/05/2021   Vision loss of right eye      Assessment: Patient presented to [facility] ED c/o chest discomfort and tightness. Pharmacy consulted to dose heparin for ACS/STEMI.   No anticoagulation PTA.   Heparin level now at goal on 700 units/hr. Patient currently in cath.    Goal of Therapy:  Heparin level 0.3-0.7 units/ml Monitor platelets by anticoagulation protocol: Yes   Plan:  Follow up plan post cath  Erin Hearing PharmD., BCPS Clinical Pharmacist 11/07/2021 2:09 PM

## 2021-11-07 NOTE — Progress Notes (Addendum)
Progress Note  Patient Name: Drisana Schweickert Date of Encounter: 11/07/2021  [facility] Cardiologist: None   Subjective   No chest pain this morning. Planned for cardiac cath today.   Inpatient Medications    Scheduled Meds:  albuterol  2.5 mg Inhalation Q12H   aspirin EC  81 mg Oral Daily   budesonide  0.5 mg Nebulization BID   cloNIDine  0.1 mg Oral TID   cloNIDine  0.2 mg Oral QHS   lisinopril  40 mg Oral Daily   polyethylene glycol  17 g Oral Daily   rosuvastatin  10 mg Oral QODAY   sodium chloride flush  3 mL Intravenous Q12H   Continuous Infusions:  sodium chloride     sodium chloride     heparin 700 Units/hr (11/07/21 0253)   PRN Meds: sodium chloride, acetaminophen, docusate sodium, fluticasone, nitroGLYCERIN, ondansetron (ZOFRAN) IV, sodium chloride flush   Vital Signs    Vitals:   11/07/21 0230 11/07/21 0400 11/07/21 0545 11/07/21 0929  BP: 137/63 134/64 (!) 176/67 (!) 198/77  Pulse: (!) 48 (!) 45 (!) 46   Resp: 16     Temp: 98.2 F (36.8 C) 98.2 F (36.8 C) 97.8 F (36.6 C)   TempSrc:  Oral Oral   SpO2:   97%   Weight:      Height:        Intake/Output Summary (Last 24 hours) at 11/07/2021 1007 Last data filed at 11/07/2021 0527 Gross per 24 hour  Intake 337 ml  Output --  Net 337 ml      11/06/2021    9:53 PM 11/06/2021    2:44 PM 10/14/2021    4:27 PM  Last 3 Weights  Weight (lbs) 117 lb 1 oz 117 lb 1 oz 122 lb  Weight (kg) 53.1 kg 53.1 kg 55.339 kg      Telemetry    Sinus Bradycardia - Personally Reviewed  ECG    No new tracing  Physical Exam   GEN: No acute distress.   Neck: No JVD Cardiac: RRR, no murmurs, rubs, or gallops.  Respiratory: Clear to auscultation bilaterally. GI: Soft, nontender, non-distended  MS: No edema; No deformity. Neuro:  Nonfocal  Psych: Normal affect   Labs    High Sensitivity Troponin:   Recent Labs  Lab 11/06/21 1525 11/06/21 1655 11/07/21 0620  TROPONINIHS 216* 320* 558*      Chemistry Recent Labs  Lab 11/06/21 1525 11/07/21 0158  NA 134* 136  K 4.3 3.7  CL 99 103  CO2 26 26  GLUCOSE 122* 106*  BUN 17 15  CREATININE 0.67 0.78  CALCIUM 9.4 8.8*  PROT 7.0  --   ALBUMIN 4.1  --   AST 22  --   ALT 13  --   ALKPHOS 47  --   BILITOT 0.8  --   GFRNONAA >60 >60  ANIONGAP 9 7    Lipids  Recent Labs  Lab 11/07/21 0158  CHOL 132  TRIG 27  HDL 64  LDLCALC 63  CHOLHDL 2.1    Hematology Recent Labs  Lab 11/06/21 1525 11/07/21 0158  WBC 12.6* 8.6  RBC 5.13* 4.56  HGB 14.9 13.6  HCT 44.3 39.7  MCV 86.4 87.1  MCH 29.0 29.8  MCHC 33.6 34.3  RDW 13.6 13.6  PLT 258 229   Thyroid No results for input(s): TSH, FREET4 in the last 168 hours.  BNPNo results for input(s): BNP, PROBNP in the last 168 hours.  DDimer No results for input(s): DDIMER in the last 168 hours.   Radiology    DG Chest 2 View  Result Date: 11/06/2021 CLINICAL DATA:  Chest pain. EXAM: CHEST - 2 VIEW COMPARISON:  None Available. FINDINGS: The heart size and mediastinal contours are within normal limits. Both lungs are clear. The visualized skeletal structures are unremarkable. IMPRESSION: No active cardiopulmonary disease. Electronically Signed   By: Marijo Conception M.D.   On: 11/06/2021 15:23   CT Head Wo Contrast  Result Date: 11/06/2021 CLINICAL DATA:  Dizziness, vertigo EXAM: CT HEAD WITHOUT CONTRAST TECHNIQUE: Contiguous axial images were obtained from the base of the skull through the vertex without intravenous contrast. RADIATION DOSE REDUCTION: This exam was performed according to the departmental dose-optimization program which includes automated exposure control, adjustment of the mA and/or kV according to patient size and/or use of iterative reconstruction technique. COMPARISON:  None Available. FINDINGS: Brain: No evidence of acute infarction, hemorrhage, hydrocephalus, extra-axial collection or mass lesion/mass effect. Cerebral cortical volume loss consistent with  atrophy. Vascular: No hyperdense vessel or unexpected calcification. Skull: Normal. Negative for fracture or focal lesion. Sinuses/Orbits: Chronic opacification of the left maxillary sinus is partially imaged. Other: None. IMPRESSION: No acute intracranial abnormality. Stable atrophy and chronic opacification of the left maxillary sinus. Electronically Signed   By: Jacqulynn Cadet M.D.   On: 11/06/2021 15:47    Cardiac Studies   Echo: pending  Patient Profile     86 y.o. female with PMH of CAD with prior PCI to LAD, HTN, HLD, RAS, DJD, carotid artery disease who presented to [facility] with chest pain. Transfered to [facility] for further management with cardiac cath.   Assessment & Plan    NSTEMI: hsTn up 216>>320>>558. EKG showed SB with no acute ischemia. Remains on IV heparin. Planned for cardiac cath today. No recurrent chest pain.  -- on ASA, statin, lisinopril , IV heparin -- echo pending  CAD: reports PCI with 2 stents to the LAD about 15 years prior in [place] -- continue ASA, statin  HTN: blood pressures are not well controlled -- she has been followed in the advanced HTN clinic with Dr. Oval Linsey, several medication intolerances -- will continue on lisinopril, along with clonidine regimen for now -- add PRN IV hydralazine   HLD: hx of statin intolerance, but has been able to tolerate crestor '10mg'$  daily  RAS: Renal US 5/23 with bilateral 1-59%  For questions or updates, please contact [place] Please consult [email or web address] for contact info under        Signed, Reino Bellis, NP  11/07/2021, 10:07 AM     ATTENDING ATTESTATION:  After conducting a review of all available clinical information with the care team, interviewing the patient, and performing a physical exam, I agree with the findings and plan described in this note.   GEN: No acute distress.   HEENT:  MMM, no JVD, no scleral icterus Cardiac: RRR, no murmurs, rubs, or gallops.  Respiratory: Clear to  auscultation bilaterally. GI: Soft, nontender, non-distended  MS: No edema; No deformity. Neuro:  Nonfocal  Vasc:  +2 radial pulses  Patient is doing well remains chest pain-free.  Continue medical therapy for acute coronary syndrome.  There are definitely some atypical features to her chest pain syndrome however.  She is n.p.o. for coronary angiography today.  If this is reassuring she can be discharged later today with appropriate follow-up.  Lenna Sciara, MD Pager [phone]

## 2021-11-07 NOTE — Plan of Care (Signed)
Problem: Education: Goal: Understanding of CV disease, CV risk reduction, and recovery process will improve 11/07/2021 1705 by Drucie Ip I, RN Outcome: Progressing 11/07/2021 1631 by Drucie Ip I, RN Outcome: Progressing Goal: Individualized Educational Video(s) 11/07/2021 1705 by Drucie Ip I, RN Outcome: Progressing 11/07/2021 1631 by Drucie Ip I, RN Outcome: Progressing   Problem: Activity: Goal: Ability to return to baseline activity level will improve 11/07/2021 1705 by Drucie Ip I, RN Outcome: Progressing 11/07/2021 1631 by Drucie Ip I, RN Outcome: Progressing   Problem: Cardiovascular: Goal: Ability to achieve and maintain adequate cardiovascular perfusion will improve 11/07/2021 1705 by Drucie Ip I, RN Outcome: Progressing 11/07/2021 1631 by Drucie Ip I, RN Outcome: Progressing Goal: Vascular access site(s) Level 0-1 will be maintained 11/07/2021 1705 by Drucie Ip I, RN Outcome: Progressing 11/07/2021 1631 by Drucie Ip I, RN Outcome: Progressing   Problem: Health Behavior/Discharge Planning: Goal: Ability to safely manage health-related needs after discharge will improve 11/07/2021 1705 by Drucie Ip I, RN Outcome: Progressing 11/07/2021 1631 by Drucie Ip I, RN Outcome: Progressing   Problem: Education: Goal: Knowledge of General Education information will improve Description: Including pain rating scale, medication(s)/side effects and non-pharmacologic comfort measures 11/07/2021 1705 by Drucie Ip I, RN Outcome: Progressing 11/07/2021 1631 by Drucie Ip I, RN Outcome: Progressing   Problem: Health Behavior/Discharge Planning: Goal: Ability to manage health-related needs will improve 11/07/2021 1705 by Drucie Ip I, RN Outcome: Progressing 11/07/2021 1631 by Drucie Ip I, RN Outcome: Progressing   Problem: Clinical Measurements: Goal: Ability to maintain clinical  measurements within normal limits will improve 11/07/2021 1705 by Drucie Ip I, RN Outcome: Progressing 11/07/2021 1631 by Drucie Ip I, RN Outcome: Progressing Goal: Will remain free from infection 11/07/2021 1705 by Drucie Ip I, RN Outcome: Progressing 11/07/2021 1631 by Drucie Ip I, RN Outcome: Progressing Goal: Diagnostic test results will improve 11/07/2021 1705 by Drucie Ip I, RN Outcome: Progressing 11/07/2021 1631 by Drucie Ip I, RN Outcome: Progressing Goal: Respiratory complications will improve 11/07/2021 1705 by Drucie Ip I, RN Outcome: Progressing 11/07/2021 1631 by Drucie Ip I, RN Outcome: Progressing Goal: Cardiovascular complication will be avoided 11/07/2021 1705 by Drucie Ip I, RN Outcome: Progressing 11/07/2021 1631 by Drucie Ip I, RN Outcome: Progressing   Problem: Activity: Goal: Risk for activity intolerance will decrease 11/07/2021 1705 by Drucie Ip I, RN Outcome: Progressing 11/07/2021 1631 by Drucie Ip I, RN Outcome: Progressing   Problem: Nutrition: Goal: Adequate nutrition will be maintained 11/07/2021 1705 by Drucie Ip I, RN Outcome: Progressing 11/07/2021 1631 by Drucie Ip I, RN Outcome: Progressing   Problem: Coping: Goal: Level of anxiety will decrease 11/07/2021 1705 by Drucie Ip I, RN Outcome: Progressing 11/07/2021 1631 by Drucie Ip I, RN Outcome: Progressing   Problem: Elimination: Goal: Will not experience complications related to bowel motility 11/07/2021 1705 by Drucie Ip I, RN Outcome: Progressing 11/07/2021 1631 by Drucie Ip I, RN Outcome: Progressing Goal: Will not experience complications related to urinary retention 11/07/2021 1705 by Drucie Ip I, RN Outcome: Progressing 11/07/2021 1631 by Drucie Ip I, RN Outcome: Progressing   Problem: Pain Managment: Goal: General experience of comfort will  improve 11/07/2021 1705 by Drucie Ip I, RN Outcome: Progressing 11/07/2021 1631 by Drucie Ip I, RN Outcome: Progressing   Problem: Safety: Goal: Ability to remain free from injury will improve 11/07/2021 1705 by Drucie Ip I, RN Outcome: Progressing 11/07/2021 1631 by Drucie Ip I, RN Outcome: Progressing   Problem: Skin Integrity: Goal: Risk for impaired skin  integrity will decrease 11/07/2021 1705 by Drucie Ip I, RN Outcome: Progressing 11/07/2021 1631 by Drucie Ip I, RN Outcome: Progressing

## 2021-11-07 NOTE — Plan of Care (Signed)

## 2021-11-07 NOTE — TOC Benefit Eligibility Note (Signed)
Patient Teacher, English as a foreign language completed.    The patient is currently admitted and upon discharge could be taking Brilinta 90 mg.  The current 30 day co-pay is, $236.68.   The patient is insured through [employer] [place] Patient Advocate Specialist [employer] Patient Advocate Team Direct Number: [phone]  Fax: [phone]

## 2021-11-07 NOTE — Plan of Care (Signed)
  Problem: Education: Goal: Understanding of CV disease, CV risk reduction, and recovery process will improve Outcome: Progressing   Problem: Activity: Goal: Ability to return to baseline activity level will improve Outcome: Progressing   Problem: Cardiovascular: Goal: Ability to achieve and maintain adequate cardiovascular perfusion will improve Outcome: Progressing   Problem: Cardiovascular: Goal: Vascular access site(s) Level 0-1 will be maintained Outcome: Progressing   Problem: Health Behavior/Discharge Planning: Goal: Ability to safely manage health-related needs after discharge will improve Outcome: Progressing   Problem: Education: Goal: Knowledge of General Education information will improve Description: Including pain rating scale, medication(s)/side effects and non-pharmacologic comfort measures Outcome: Progressing   Problem: Health Behavior/Discharge Planning: Goal: Ability to manage health-related needs will improve Outcome: Progressing   Problem: Safety: Goal: Ability to remain free from injury will improve Outcome: Progressing

## 2021-11-07 NOTE — Progress Notes (Signed)
  Transition of Care [facility]) Screening Note   Patient Details  Name: Novia Lansberry Date of Birth: [birth year]   Transition of Care [facility]) CM/SW Contact:    Milas Gain, [place] Phone Number: 11/07/2021, 3:00 PM    Transition of Care Department [facility]) has reviewed patient and no TOC needs have been identified at this time. We will continue to monitor patient advancement through interdisciplinary progression rounds. If new patient transition needs arise, please place a TOC consult.

## 2021-11-08 DIAGNOSIS — I214 Non-ST elevation (NSTEMI) myocardial infarction: Secondary | ICD-10-CM | POA: Diagnosis not present

## 2021-11-08 LAB — CBC
HCT: 42.4 % (ref 36.0–46.0)
Hemoglobin: 14.6 g/dL (ref 12.0–15.0)
MCH: 29.4 pg (ref 26.0–34.0)
MCHC: 34.4 g/dL (ref 30.0–36.0)
MCV: 85.3 fL (ref 80.0–100.0)
Platelets: 250 10*3/uL (ref 150–400)
RBC: 4.97 MIL/uL (ref 3.87–5.11)
RDW: 13.6 % (ref 11.5–15.5)
WBC: 9.2 10*3/uL (ref 4.0–10.5)
nRBC: 0 % (ref 0.0–0.2)

## 2021-11-08 LAB — BASIC METABOLIC PANEL
Anion gap: 9 (ref 5–15)
BUN: 16 mg/dL (ref 8–23)
CO2: 21 mmol/L — ABNORMAL LOW (ref 22–32)
Calcium: 9 mg/dL (ref 8.9–10.3)
Chloride: 105 mmol/L (ref 98–111)
Creatinine, Ser: 0.79 mg/dL (ref 0.44–1.00)
GFR, Estimated: >60 mL/min (ref >60)
Glucose, Bld: 107 mg/dL — ABNORMAL HIGH (ref 70–99)
Potassium: 4.3 mmol/L (ref 3.5–5.1)
Sodium: 135 mmol/L (ref 135–145)

## 2021-11-08 MED ORDER — TICAGRELOR 90 MG PO TABS: 90.0000 mg | ORAL_TABLET | Freq: Two times a day (BID) | ORAL | 11 refills | Status: DC

## 2021-11-08 MED ORDER — TICAGRELOR 90 MG PO TABS
90.0000 mg | ORAL_TABLET | Freq: Two times a day (BID) | ORAL | 0 refills | Status: DC
Start: 2021-11-08 — End: 2021-11-08

## 2021-11-08 MED ORDER — NITROGLYCERIN 0.4 MG SL SUBL
0.4000 mg | SUBLINGUAL_TABLET | SUBLINGUAL | 2 refills | Status: DC | PRN
Start: 2021-11-08 — End: 2022-10-21

## 2021-11-08 NOTE — Progress Notes (Signed)
Patient was asking this morning if she could be sent home on Plavix instead of Brilinta if they are similar and it would be feasible due to cost of Brilinta.

## 2021-11-08 NOTE — Progress Notes (Signed)
CARDIAC REHAB PHASE I   PRE:  Rate/Rhythm: 71 SR    BP: sitting 165/70    SaO2:   MODE:  Ambulation: 470 ft   POST:  Rate/Rhythm: 120 ST, slowed quickly after walk with many PACs    BP: sitting 184/97     SaO2:   Pt feeling well this am except knees feel weak due to bedrest. Slow walk, no c/o. HR elevated with distance, BP as well. Once sitting HR decreased quickly but did have increased PACs. Denied CP.  Discussed MI, stent, restrictions, Brilinta importance, exercise, NTG and CRPII. Pt well versed on diet and very cognizant of her health. Has questions regarding Plavix vs Brilinta longterm. Will refer to [facility] CES, ACSM 11/08/2021 9:15 AM

## 2021-11-08 NOTE — Discharge Summary (Addendum)
Discharge Summary    Patient ID: Kendra [name] MRN: [id number]; DOB: [birth year]  Admit date: 11/06/2021 Discharge date: 11/08/2021  PCP:  Vernie Shanks, MD   [facility] Providers Cardiologist: Previously Dr. Einar Gip; Followed more recently by Dr. Oval Linsey in [facility] and wishes to follow-up with [facility] going forward  Discharge Diagnoses    Principal Problem:   NSTEMI (non-ST elevated myocardial infarction) [facility]) Active Problems:   Coronary artery disease   Essential hypertension   Hyperlipidemia   Diagnostic Studies/Procedures    Echocardiogram: 11/07/2021 IMPRESSIONS     1. Left ventricular ejection fraction, by estimation, is 60 to 65%. The  left ventricle has normal function. The left ventricle has no regional  wall motion abnormalities. Left ventricular diastolic function could not  be evaluated.   2. Right ventricular systolic function is normal. The right ventricular  size is normal. Tricuspid regurgitation signal is inadequate for assessing  PA pressure.   3. Left atrial size was mildly dilated.   4. The mitral valve is normal in structure. No evidence of mitral valve  regurgitation. Moderate mitral annular calcification.   5. The aortic valve is normal in structure. There is mild calcification  of the aortic valve. Aortic valve regurgitation is not visualized.   6. Aortic no significant ascending aortic aneurysm.   7. The inferior vena cava is dilated in size with >50% respiratory  variability, suggesting right atrial pressure of 8 mmHg.   Comparison(s): No significant change from prior study.   LHC: 11/07/2021   Mid LAD lesion is 65% stenosed.   Scoring balloon angioplasty was performed.   Post intervention, there is a 0% residual stenosis.   LV end diastolic pressure is low.   1.  Moderate in-stent restenosis with an RFR of 0.88 with minimal disease elsewhere.  This was treated with OCT guided balloon angioplasty with a scoring  balloon. 2.  LVEDP of 13 mmHg.   Recommendation: Dual antiplatelet therapy for 1 year.    Diagnostic Dominance: Right Intervention       History of Present Illness     Kendra [name] is a 86 y.o. female with past medical history of CAD (s/p prior PCI to LAD in early 2000's), HTN, HLD, renal artery stenosis, carotid artery stenosis and multiple medication intolerances who presented to [facility] ED on 11/06/2021 for evaluation of dizziness and chest discomfort which had started the day prior to arrival.   CXR showed no active cardiopulmonary disease and CT Head showed no acute intracranial abnormalities. She was found to have an NSTEMI with initial Hs Troponin at 216 and trending up to 558. EKG showed sinus bradycardia with no acute ST changes. She was admitted and started on IV Heparin with plans for a cardiac catheterization the following day.   Hospital Course     Consultants: None   The following morning, she denied any recurrent chest pain. Echo showed a preserved EF of 60-65% with no regional wall motion abnormalities and no significant valve abnormalities. Cardiac catheterization was performed by Dr. Ali Lowe and showed moderate ISR of her prior LAD stent with 65% stenosis with FFR at 0.88. Was treated with OCT guided balloon angioplasty with a scoring balloon and reduced to 0% residual stenosis. Was recommended to be on DAPT for 1 year and she was started on Brilinta while remaining on ASA.   The following morning, she reported feeling well and denied any recurrent chest pain or dyspnea. She did have ecchymosis along her  right forearm but no evidence of a pseudoaneurysm. Telemetry showed sinus bradycardia with HR in the 40's to 50's. She did have a brief narrow-complex tachycardia which lasted for less than 15 seconds. EKG showed sinus bradycardia, HR 52 with no acute ST changes. Post-procedure labs showed Hgb was stable at 14.6 with platelets at 250. K+ at 4.3 and creatinine  0.79. Was discharged home in stable condition. Will arrange for a TOC visit. At the time of follow-up, will likely need to switch from Brilinta to Plavix secondary to cost.    Did the patient have an acute coronary syndrome (MI, NSTEMI, STEMI, etc) this admission?:  Yes                               AHA/ACC Clinical Performance & Quality Measures: Aspirin prescribed? - Yes ADP Receptor Inhibitor (Plavix/Clopidogrel, Brilinta/Ticagrelor or Effient/Prasugrel) prescribed (includes medically managed patients)? - Yes Beta Blocker prescribed? - No - Bradycardia High Intensity Statin (Lipitor 40-'80mg'$  or Crestor 20-'40mg'$ ) prescribed? - Yes EF assessed during THIS hospitalization? - Yes For EF <40%, was ACEI/ARB prescribed? - Not Applicable (EF >/= 95%) For EF <40%, Aldosterone Antagonist (Spironolactone or Eplerenone) prescribed? - Not Applicable (EF >/= 28%) Cardiac Rehab Phase II ordered (including medically managed patients)? - Yes    The patient will be scheduled for a TOC follow up appointment on 11/17/2021.  A message has been sent to the [facility] and Scheduling Pool at the office where the patient should be seen for follow up.  _____________  Discharge Physical Exam and Vitals Blood pressure (!) 165/70, pulse 64, temperature 98 F (36.7 C), temperature source Oral, resp. rate 16, height '5\' 5"'$  (1.651 m), weight 53.1 kg, SpO2 97 %.  Filed Weights   11/06/21 1444 11/06/21 2153  Weight: 53.1 kg 53.1 kg   General: Pleasant female appearing in NAD Psych: Normal affect. Neuro: Alert and oriented X 3. Moves all extremities spontaneously. HEENT: Normal  Neck: Supple without bruits or JVD. Lungs:  Resp regular and unlabored, CTA without wheezing or rales. Heart: RRR no s3, s4, or murmurs. Abdomen: Soft, non-tender, non-distended, BS + x 4.  Extremities: No clubbing, cyanosis or edema. DP/PT/Radials 2+ and equal bilaterally. Significant ecchymosis along right forearm but no evidence of a  pseudoaneurysm.   Labs & Radiologic Studies    CBC Recent Labs    11/07/21 0158 11/08/21 0254  WBC 8.6 9.2  HGB 13.6 14.6  HCT 39.7 42.4  MCV 87.1 85.3  PLT 229 413   Basic Metabolic Panel Recent Labs    11/07/21 0158 11/08/21 0254  NA 136 135  K 3.7 4.3  CL 103 105  CO2 26 21*  GLUCOSE 106* 107*  BUN 15 16  CREATININE 0.78 0.79  CALCIUM 8.8* 9.0   Liver Function Tests Recent Labs    11/06/21 1525  AST 22  ALT 13  ALKPHOS 47  BILITOT 0.8  PROT 7.0  ALBUMIN 4.1   Recent Labs    11/06/21 1525  LIPASE 38   High Sensitivity Troponin:   Recent Labs  Lab 11/06/21 1525 11/06/21 1655 11/07/21 0620  TROPONINIHS 216* 320* 558*    BNP Invalid input(s): POCBNP D-Dimer No results for input(s): DDIMER in the last 72 hours. Hemoglobin A1C No results for input(s): HGBA1C in the last 72 hours. Fasting Lipid Panel Recent Labs    11/07/21 0158  CHOL 132  HDL 64  LDLCALC 63  TRIG  27  CHOLHDL 2.1   Thyroid Function Tests No results for input(s): TSH, T4TOTAL, T3FREE, THYROIDAB in the last 72 hours.  Invalid input(s): FREET3 _____________  DG Chest 2 View  Result Date: 11/06/2021 CLINICAL DATA:  Chest pain. EXAM: CHEST - 2 VIEW COMPARISON:  None Available. FINDINGS: The heart size and mediastinal contours are within normal limits. Both lungs are clear. The visualized skeletal structures are unremarkable. IMPRESSION: No active cardiopulmonary disease. Electronically Signed   By: Marijo Conception M.D.   On: 11/06/2021 15:23   CT Head Wo Contrast  Result Date: 11/06/2021 CLINICAL DATA:  Dizziness, vertigo EXAM: CT HEAD WITHOUT CONTRAST TECHNIQUE: Contiguous axial images were obtained from the base of the skull through the vertex without intravenous contrast. RADIATION DOSE REDUCTION: This exam was performed according to the departmental dose-optimization program which includes automated exposure control, adjustment of the mA and/or kV according to patient size  and/or use of iterative reconstruction technique. COMPARISON:  None Available. FINDINGS: Brain: No evidence of acute infarction, hemorrhage, hydrocephalus, extra-axial collection or mass lesion/mass effect. Cerebral cortical volume loss consistent with atrophy. Vascular: No hyperdense vessel or unexpected calcification. Skull: Normal. Negative for fracture or focal lesion. Sinuses/Orbits: Chronic opacification of the left maxillary sinus is partially imaged. Other: None. IMPRESSION: No acute intracranial abnormality. Stable atrophy and chronic opacification of the left maxillary sinus. Electronically Signed   By: Jacqulynn Cadet M.D.   On: 11/06/2021 15:47   Disposition   Pt is being discharged home today in good condition.  Follow-up Plans & Appointments     Follow-up Information     Deberah Pelton, NP Follow up.   Specialty: Cardiology Why: Cardiology Hospital Follow-up on 11/17/2021 at 2:20 PM. Will be at the [facility] in [facility]. Contact information: [address] [place] [address] [phone]                Discharge Instructions     Amb Referral to Cardiac Rehabilitation   Complete by: As directed    Diagnosis:  Coronary Stents NSTEMI PTCA     After initial evaluation and assessments completed: Virtual Based Care may be provided alone or in conjunction with Phase 2 Cardiac Rehab based on patient barriers.: Yes   Discharge instructions   Complete by: As directed    PLEASE REMEMBER TO BRING ALL OF YOUR MEDICATIONS TO EACH OF [place].  PLEASE ATTEND ALL SCHEDULED FOLLOW-UP APPOINTMENTS.   Activity: Increase activity slowly as tolerated. You may shower, but no soaking baths (or swimming) for 1 week. No driving for 1 week. No lifting over 10 lbs for 2 weeks. No sexual activity for 2 weeks.   You May Return to Work: in 1 week (if applicable)  Wound Care: You may wash cath site gently with soap and water. Keep cath  site clean and dry. If you notice pain, swelling, bleeding or pus at your cath site, please call [phone].       Discharge Medications   Allergies as of 11/08/2021       Reactions   Chlorthalidone Other (See Comments)   Hyponatremia   Other    Other reaction(s): Respiratory Distress   Amlodipine    "shivers"    Ciprofloxacin Other (See Comments)   UNK reaction   Dog Epithelium Allergy Skin Test    Latex Other (See Comments)   Irritates her skin   Statins    Unable to tolerate statins w the exception of crestor.  Sulfa Antibiotics Other (See Comments)   "didnt feel good"   Sulfamethoxazole Other (See Comments)   Makes her feel bad   Zetia [ezetimibe] Other (See Comments)   "makes me constipated"   Penicillins Rash        Medication List     STOP taking these medications    irbesartan 150 MG tablet Commonly known as: AVAPRO       TAKE these medications    albuterol 108 (90 Base) MCG/ACT inhaler Commonly known as: VENTOLIN HFA Inhale 1-2 puffs into the lungs every 6 (six) hours as needed for wheezing or shortness of breath.   ARNICA EX Apply 1 application. topically daily as needed (arthritis). Sore areas   aspirin EC 81 MG tablet Take 81 mg by mouth daily. Swallow whole.   beclomethasone 80 MCG/ACT inhaler Commonly known as: QVAR Inhale 1-2 puffs into the lungs 2 (two) times daily.   BIOTIN 5000 PO Take 1 tablet by mouth daily.   BLACK ELDERBERRY PO Take 575 mg by mouth daily.   calcium carbonate 1250 (500 Ca) MG tablet Commonly known as: OS-CAL - dosed in mg of elemental calcium Take 1 tablet by mouth daily.   cholecalciferol 1000 units tablet Commonly known as: VITAMIN D Take 1,000 Units by mouth daily.   clobetasol 0.05 % topical foam Commonly known as: OLUX Apply 1 application. topically daily as needed (sensitive scalp).   cloNIDine 0.1 MG tablet Commonly known as: CATAPRES TAKE 1 TABLET THREE TIMES A DAY AND 2 TABLETS AT  BEDTIME What changed:  how much to take how to take this when to take this additional instructions   Co Q-10 100 MG Caps Take 100 mg by mouth daily.   docusate sodium 100 MG capsule Commonly known as: COLACE Take 100 mg by mouth daily as needed.   fluticasone 50 MCG/ACT nasal spray Commonly known as: FLONASE Place 1 spray into both nostrils daily as needed for allergies.   lisinopril 40 MG tablet Commonly known as: ZESTRIL Take 1 tablet (40 mg total) by mouth daily.   multivitamin capsule Take 1 capsule by mouth daily.   nitroGLYCERIN 0.4 MG SL tablet Commonly known as: NITROSTAT Place 1 tablet (0.4 mg total) under the tongue every 5 (five) minutes x 3 doses as needed for chest pain.   polyethylene glycol 17 g packet Commonly known as: MIRALAX / GLYCOLAX Take 17 g by mouth daily.   rosuvastatin 10 MG tablet Commonly known as: CRESTOR Take 10 mg by mouth every other day.   ticagrelor 90 MG Tabs tablet Commonly known as: BRILINTA Take 1 tablet (90 mg total) by mouth 2 (two) times daily.   Tiger Balm Neck & Shoulder Rub 10-11 % Crea Generic drug: Menthol-Camphor Apply 1 application. topically daily as needed (arthritis pain).   triamcinolone cream 0.1 % Commonly known as: KENALOG Apply 1 application. topically 2 (two) times daily as needed (rash, itching).   vitamin C 1000 MG tablet Take 1,000 mg by mouth daily.         Outstanding Labs/Studies   None  Duration of Discharge Encounter   Greater than 30 minutes including physician time.  Signed, Erma Heritage, PA-C 11/08/2021, 9:49 AM   History and all data above reviewed.  Patient examined.  I agree with the findings as above.  No chest pain.  No SOB. Ambulated in the hallway.  The patient exam reveals COR:RRR  ,  Lungs: Clear  ,  Abd: Positive bowel sounds, no rebound  no guarding, Ext  Bruising at the right base.   .  All available labs, radiology testing, previous records reviewed. Agree with  documented assessment and plan. CAD:  OK to go home.  Wants to follow with Dr. Oval Linsey.  Follow up arranged.  Meds as above.   Jeneen Rinks [facility]  10:23 AM  11/08/2021

## 2021-11-09 MED FILL — Nitroglycerin IV Soln 100 MCG/ML in D5W: INTRA_ARTERIAL | Qty: 10 | Status: AC

## 2021-11-10 ENCOUNTER — Telehealth: Payer: Self-pay

## 2021-11-10 ENCOUNTER — Encounter (HOSPITAL_COMMUNITY): Payer: Self-pay | Admitting: Internal Medicine

## 2021-11-10 NOTE — Telephone Encounter (Signed)
-----   Message from Erma Heritage, [place] sent at 11/08/2021 10:38 AM EDT ----- Regarding: TOC Call Good morning,   This patient is scheduled to see Coletta Memos on 11/17/2021 but will need a TOC call. Thanks for your help!  Best,  Bernerd Pho

## 2021-11-12 ENCOUNTER — Encounter (HOSPITAL_BASED_OUTPATIENT_CLINIC_OR_DEPARTMENT_OTHER): Payer: Self-pay | Admitting: Cardiovascular Disease

## 2021-11-12 LAB — LIPOPROTEIN A (LPA): Lipoprotein (a): 16.4 nmol/L (ref <75.0)

## 2021-11-12 NOTE — Assessment & Plan Note (Signed)
LDL goal <70.  Well controlled on rosuvastatin.

## 2021-11-12 NOTE — Assessment & Plan Note (Signed)
She is due to repeat carotid Dopplers.  We will order. Continue aspirin and rosuvastatin.

## 2021-11-13 NOTE — Telephone Encounter (Signed)
Called patient to follow up on recent hospitalization  She is aware of her follow up date, time and locaiton  She is taking her medications as prescribed but is having some issues with her blood pressure  One day woke up at 4am to go to restroom and checked blood pressure and SBP 190's  This am 184/92 HR 46 prior to medications and at 11:00 am 156/84 HR 41  She is wondering if she could take her Lisinopril 40 mg 1/2 tablet twice a day versus 40 mg 1 tablet in the morning   Will forward to Dr Oval Linsey and Sherian Rein D for review

## 2021-11-14 ENCOUNTER — Telehealth (HOSPITAL_COMMUNITY): Payer: Self-pay

## 2021-11-14 NOTE — Telephone Encounter (Signed)
Pt insurance is active and benefits verified through Medicare A/B. Co-pay $0.00, DED $226.00/$226.00 met, out of pocket $0.00/$0.00 met, co-insurance 20%. No pre-authorization required. Passport, 11/14/21 @ 2:17PM, OIP#[id number]   How many CR sessions are covered? (36 sessions for TCR, 72 sessions for ICR)72 Is this a lifetime maximum or an annual maximum? No Has the member used any of these services to date? N/A Is there a time limit (weeks/months) on start of program and/or program completion? No   2ndary insurance is active and benefits verified through [facility]. Co-pay $0.00, DED $0.00/$0.00 met, out of pocket $0.00/$0.00 met, co-insurance 0%. No pre-authorization required. Passport, 11/14/21 @ 2:19PM, QLR#[id number]      Will contact patient to see if she is interested in the Cardiac Rehab Program. If interested, patient will need to complete follow up appt. Once completed, patient will be contacted for scheduling upon review by the RN Navigator.

## 2021-11-14 NOTE — Telephone Encounter (Signed)
Advised patient, verbalized understanding  

## 2021-11-14 NOTE — Telephone Encounter (Signed)
Yes, she can take lisinopril '40mg'$  1/2 tab twice daily until her appointment on 6/12.  Recommend she bring BP log with her to appointment

## 2021-11-16 NOTE — Progress Notes (Unsigned)
Hypertension Clinic Initial Assessment:    Date:  11/17/2021   ID:  Kendra [name], DOB 11/05/1935, MRN [id number]  PCP:  Vernie Shanks, MD  Cardiologist:  Skeet Latch, MD   Referring MD: Vernie Shanks, MD   CC: Hypertension  History of Present Illness:    Kendra [name] is a 86 y.o. female with a hx of coronary artery disease, essential hypertension, carotid artery disease, bilateral varicose veins, renal artery stenosis, moderate persistent asthma, hearing loss, skin lesions, hyperlipidemia, pedal edema, and prediabetes.  She underwent cardiac catheterization with PCI to her LAD.  She was seen 10/22 for discussion of medication management.  She was seen by Dr. Einar Gip 9/22 for blood pressure control.  She was intolerant of multiple medications.  She had a renal artery Doppler 5/22 which showed greater than 50% stenosis bilateral arteries.  Her left renal artery was not fully visualized.  She had a CTA of the abdomen and pelvis 11/22 which showed 50% proximal left renal artery stenosis and 2 right renal arteries.  Her hydralazine was reduced to 25 mg.  She was previously not able to tolerate medicine due to headaches.  She was seen by Dr. Alvester Chou 1/22 and her blood pressure was 138/70.  At the time of the visit she was on clonidine losartan.  She was seen by Dr. Oval Linsey 05/05/2021.  She was seen as part of the advanced hypertension clinic.  Her blood pressure was 214/76.  She continued to report intolerance of multiple antihypertensive medications.  Her losartan was switched to irbesartan 150 twice daily.  Hydralazine was discontinued and she was started on spironolactone.  She was instructed to start taking clonidine every 8 hours.  She noticed that her blood pressure was increasing at night.  She had noticed this for 30 years but reported that it was more prominent recently.  She was taking her evening medication around 10 PM.  She would recheck her blood pressure around 3 AM and  noted blood pressures in the 400-867 systolic range.  Her blood pressure was much better after exercise.  She noted that it had been in the 619-509 systolic range at night.  She reported increased hair loss since increasing clonidine to 3 times daily.  She stopped taking spironolactone due to her increased urination.  She did not note improvement in her blood pressure and felt sluggish.  She denied snoring and her husband have not noted apnea.  She denied daytime somnolence and was puzzled as to why her blood pressure would be elevated at night.  At the time of her last visit her clonidine was continued and her irbesartan was increased to 300 mg daily.  Follow-up was planned for 1 month.  She presented to the clinic 06/25/21 for follow-up evaluation stated she stopped taking indapamide due to urine retention.  She continued to have frequent urination at night but reported it was less since stopping the medication.  We reviewed her diet and p.o. fluid intake.  She reported that she would  drink 2 cups of coffee in the morning and also had coffee at noon.   I have asked her to stop caffeine and avoid chocolate.  She brought in her blood pressure log which showed blood pressures in the 326-712 systolic range.  She reported that she had well-controlled blood pressure after tai chi and walking.  She had limited her physical activity due to the cooler weather.  I encouraged her to increase her physical activity as tolerated,  increased her clonidine to 0.2 mg at bedtime and asked her to continue 0.1 mg in the morning and at noon.  I planned follow-up with pharmacy in around 1 month.  She was seen in follow-up by Dr. Oval Linsey on 10/14/2021.  She had been switched to clonidine patches.  She reported developing a sinus headache which started 4/23.  Her headaches occurred in the afternoons.  It gradually improved over 1 week.  Her blood pressure log showed readings in the 130s-140s after using good blood pressure  monitoring technique.  She denied spikes in her blood pressure.  She had not yet started lisinopril because she was current concerned about developing more labile blood pressure.  She was trying to exercise more frequently.  She was walking 1.5 miles.  She did report mild asthma and was using her inhaler in the morning.  She denied exertional symptoms, palpitations, chest discomfort, lower extremity edema, syncope, PND and orthopnea.  She presented to the emergency department on 11/06/2021 for evaluation of dizziness and chest discomfort that started prior to arrival.  Her chest x-ray showed no acute cardiopulmonary disease.  Her head CT showed no acute intercranial abnormalities.  She was found to have an NSTEMI with her initial high-sensitivity troponins at 216 which trended up to 558.  Her EKG showed sinus bradycardia with no acute ST changes.  She was initiated on IV heparin.  She underwent LHC 11/07/21 which showed moderate in-stent restenosis with RFR of 0.88.  She received PCI with scoring balloon angioplasty.  Postintervention she was noted to have 0% residual stenosis.  She was placed on dual antiplatelet therapy for 1 year.  She presents to the clinic today for follow-up evaluation states she is concerned about the bruising that is on her right forearm and around her elbow.  She does also note slight swelling around her elbow.  She feels that she has increased mobility since having catheterization.  We reviewed her cardiac catheterization and she expressed understanding.  We reviewed her Brilinta and lisinopril medications.  She feels that she is having more headaches since starting Brilinta.  We reviewed the importance of medication compliance and its benefits.  I did ask her to contact the cardiology clinic after 1 month of therapy to review her symptoms.  She did report 1 episode of chest discomfort after being discharged from the hospital.  Her chest discomfort dissipated on its own without  intervention.  She has resumed her normal physical activity walking 15 minutes daily and tai chi.  She denies chest discomfort with increased physical activity.  I will continue her current medication therapy, change lisinopril to 20 mg a.m. and 20 mg p.m.[name]  She may use elevation, light compression sleeve, hot and cold therapy, and range of motion activities for her right elbow.  I will plan follow-up in 3 to 4 months.    Prior Antihypertensives: Chlorthalidone Amlodipine - chills Bystolic Nifedipine Valsartan Olmesartan Spironolactone  Today she denies chest pain, shortness of breath, lower extremity edema, fatigue, palpitations, melena, hematuria, hemoptysis, diaphoresis, weakness, presyncope, syncope, orthopnea, and PND.  Current antihypertensive medication Clonidine 0.1 mg 3 times daily-okay to take an extra tablet as needed for blood pressure greater than 481 systolic-increased clonidine to 0.2 mg at bedtime  Lisinopril 40 mg daily     Past Medical History:  Diagnosis Date   Asthma    CAD (coronary artery disease)    last cath 08/2006   Carotid artery disease (HCC)    moderate left ICA stenosis  by 2.6 ultrasound   DJD (degenerative joint disease), lumbar    Hearing loss    HTN (hypertension)    Hyperlipidemia    Renal artery stenosis ([place]) 05/05/2021   Vision loss of right eye     Past Surgical History:  Procedure Laterality Date   ABDOMINAL HYSTERECTOMY  1975   APPENDECTOMY     CORONARY ANGIOPLASTY WITH STENT PLACEMENT  08/2006   taxus stent x2 to LAD, 2.5x31m and 2.5x822m [place] [facility] 11/07/2021   Procedure: INTRAVASCULAR PRESSURE WIRE/FFR STUDY;  Surgeon: ThEarly OsmondMD;  [facility];  Service: Cardiovascular;  Laterality: N/A;   LEFT HEART CATH AND CORONARY ANGIOGRAPHY N/A 11/07/2021   Procedure: LEFT HEART CATH AND CORONARY ANGIOGRAPHY;  Surgeon: ThEarly OsmondMD;  [facility];  Service: Cardiovascular;  Laterality: N/A;   [facility]  [facility]  Current Medications: Current Meds  Medication Sig   albuterol (VENTOLIN HFA) 108 (90 Base) MCG/ACT inhaler Inhale 1-2 puffs into the lungs every 6 (six) hours as needed for wheezing or shortness of breath.   Ascorbic Acid (VITAMIN C) 1000 MG tablet Take 1,000 mg by mouth daily.   aspirin EC 81 MG tablet Take 81 mg by mouth daily. Swallow whole.   beclomethasone (QVAR) 80 MCG/ACT inhaler Inhale 1-2 puffs into the lungs 2 (two) times daily.   BIOTIN 5000 PO Take 1 tablet by mouth daily.   BLACK ELDERBERRY PO Take 575 mg by mouth daily.   calcium carbonate (OS-CAL - DOSED IN MG OF ELEMENTAL CALCIUM) 1250 (500 Ca) MG tablet Take 1 tablet by mouth daily.   cholecalciferol (VITAMIN D) 1000 UNITS tablet Take 1,000 Units by mouth daily.   clobetasol (OLUX) 0.05 % topical foam Apply 1 application. topically daily as needed (sensitive scalp).   cloNIDine (CATAPRES) 0.1 MG tablet TAKE 1 TABLET THREE TIMES A DAY AND 2 TABLETS AT BEDTIME (Patient taking differently: Take 0.1-0.2 mg by mouth See admin instructions. Take 1 tab ( 0.1 mg) three times daily and 2 tabs ( 0.2 mg) at bedtime.)   Coenzyme Q10 (CO Q-10) 100 MG CAPS Take 100 mg by mouth daily.    docusate sodium (COLACE) 100 MG capsule Take 100 mg by mouth daily as needed.   fluticasone (FLONASE) 50 MCG/ACT nasal spray Place 1 spray into both nostrils daily as needed for allergies.   Homeopathic Products (ARNICA EX) Apply 1 application. topically daily as needed (arthritis). Sore areas   lisinopril (ZESTRIL) 40 MG tablet Take 40 mg by mouth as directed. Take 1/2 tablet twice a day   Menthol-Camphor (TIGER BALM NECK & SHOULDER RUB) 10-11 % CREA Apply 1 application. topically daily as needed (arthritis pain).   Multiple Vitamin (MULTIVITAMIN) capsule Take 1 capsule by mouth daily.   nitroGLYCERIN  (NITROSTAT) 0.4 MG SL tablet Place 1 tablet (0.4 mg total) under the tongue every 5 (five) minutes x 3 doses as needed for chest pain.   polyethylene glycol (MIRALAX / GLYCOLAX) 17 g packet Take 17 g by mouth daily.   rosuvastatin (CRESTOR) 10 MG tablet Take 10 mg by mouth every other day.   ticagrelor (BRILINTA) 90 MG TABS tablet Take 1 tablet (90 mg total) by mouth 2 (two) times daily.   triamcinolone (KENALOG) 0.1 % Apply 1 application. topically 2 (two) times daily as needed (rash, itching).  Allergies:   Chlorthalidone, Other, Amlodipine, Ciprofloxacin, Dog epithelium allergy skin test, Latex, Statins, Sulfa antibiotics, Sulfamethoxazole, Zetia [ezetimibe], and Penicillins   Social History   Socioeconomic History   Marital status: Married    Spouse name: Not on file   Number of children: 1   Years of education: MAx2   Highest education level: Not on file  Occupational History   Occupation: Retired   Occupation: Education officer, museum  Tobacco Use   Smoking status: Former    Types: Cigarettes    Quit date: 06/08/1966    Years since quitting: 55.4    Passive exposure: Past   Smokeless tobacco: Never  Vaping Use   Vaping Use: Never used  Substance and Sexual Activity   Alcohol use: Not Currently    Alcohol/week: 1.0 standard drink of alcohol    Types: 1 Standard drinks or equivalent per week    Comment: wine   Drug use: No   Sexual activity: Yes  Other Topics Concern   Not on file  Social History Narrative   Not on file   Social Determinants of Health   Financial Resource Strain: Not on file  Food Insecurity: Not on file  Transportation Needs: Not on file  Physical Activity: Sufficiently Active (04/03/2021)   Exercise Vital Sign    Days of Exercise per Week: 5 days    Minutes of Exercise per Session: 30 min  Stress: Not on file  Social Connections: Not on file     Family History: The patient's family history includes Breast cancer in her maternal grandmother; Cancer  in her maternal grandmother and mother; Diabetes in her father; Heart attack in her mother and paternal grandfather; Heart failure in her father; Hyperlipidemia in her sister.  ROS:   Please see the history of present illness.     All other systems reviewed and are negative.  EKGs/Labs/Other Studies Reviewed:    EKG: 11/17/2021 sinus bradycardia with sinus arrhythmia 49 bpm   EKG 1/23 the ekg ordered today demonstrates sinus bradycardia 55 bpm  CT-A Abd/pelvis 04/08/21: IMPRESSION: 1. No evidence of high-grade renal artery stenosis by CTA. Single left renal artery demonstrates approximately 50% proximal stenosis. There are 2 separate right renal arteries emanating from the abdominal aorta. The larger of these to arteries demonstrates approximately 40% proximal stenosis. The smaller right renal artery demonstrates no stenosis. 2. Mild plaque at the origins of the celiac axis and SMA without evidence of significant stenosis. Some degree of stenosis is felt to be present at the origin of the IMA. 3. Calcified plaque at the origins of bilateral common iliac arteries causing roughly 50% bilateral stenoses.   PCV Renal Artery Duplex 10/17/2020: Hemodynamically significant stenosis bilaterally. Left renal artery not  well visualized. >50% stenosis bilaterally. Normal intrarenal vascular  perfusion is noted in the right kidney. Renal length is within normal limits for right kidney. Abdominal aorta shows atherosclerotic changes and mild ectasia, consider dedicated aortic duplex if clinically indicated.    Carotid Duplex 07/05/2020: Summary:  Right Carotid: Velocities in the right ICA are consistent with a 1-39%  stenosis.   Left Carotid: Velocities in the left ICA are consistent with a 40-59%  stenosis.   Vertebrals:  Bilateral vertebral arteries demonstrate antegrade flow.  Subclavians: Normal flow hemodynamics were seen in bilateral subclavian arteries.    Echo 04/10/2020:  1. Left  ventricular ejection fraction, by estimation, is 60 to 65%. The  left ventricle has normal function. The left ventricle has no regional  wall  motion abnormalities. Left ventricular diastolic parameters are  consistent with Grade I diastolic  dysfunction (impaired relaxation). Elevated left ventricular end-diastolic  pressure.   2. Right ventricular systolic function is normal. The right ventricular  size is normal. There is normal pulmonary artery systolic pressure.   3. Left atrial size was mildly dilated.   4. The mitral valve is normal in structure. Trivial mitral valve  regurgitation. No evidence of mitral stenosis. Moderate mitral annular  calcification.   5. The aortic valve is tricuspid. Aortic valve regurgitation is not  visualized. No aortic stenosis is present.   6. The inferior vena cava is normal in size with greater than 50%  respiratory variability, suggesting right atrial pressure of 3 mmHg.   LHC 6-23   Mid LAD lesion is 65% stenosed.   Scoring balloon angioplasty was performed.   Post intervention, there is a 0% residual stenosis.   LV end diastolic pressure is low.   1.  Moderate in-stent restenosis with an RFR of 0.88 with minimal disease elsewhere.  This was treated with OCT guided balloon angioplasty with a scoring balloon. 2.  LVEDP of 13 mmHg.   Recommendation: Dual antiplatelet therapy for 1 year.     Diagnostic Dominance: Right Intervention        Recent Labs: 11/06/2021: ALT 13 11/08/2021: BUN 16; Creatinine, Ser 0.79; Hemoglobin 14.6; Platelets 250; Potassium 4.3; Sodium 135   Recent Lipid Panel    Component Value Date/Time   CHOL 132 11/07/2021 0158   TRIG 27 11/07/2021 0158   HDL 64 11/07/2021 0158   CHOLHDL 2.1 11/07/2021 0158   VLDL 5 11/07/2021 0158   LDLCALC 63 11/07/2021 0158    Physical Exam:    VS:  BP 120/70 (BP Location: Left Arm)   Pulse (!) 49   Ht 5' 5"  (1.651 m)   Wt 118 lb 9.6 oz (53.8 kg)   SpO2 99%   BMI 19.74 kg/m      Wt Readings from Last 3 Encounters:  11/17/21 118 lb 9.6 oz (53.8 kg)  11/06/21 117 lb 1 oz (53.1 kg)  10/14/21 122 lb (55.3 kg)     GEN:  Well nourished, well developed in no acute distress HEENT: Normal NECK: No JVD; No carotid bruits LYMPHATICS: No lymphadenopathy CARDIAC: RRR, no murmurs, rubs, gallops RESPIRATORY:  Clear to auscultation without rales, wheezing or rhonchi  ABDOMEN: Soft, non-tender, non-distended MUSCULOSKELETAL:  No edema; No deformity  SKIN: Warm and dry,  right forearm healing contusion.  Right radial cath site with no signs of infection NEUROLOGIC:  Alert and oriented x 3 PSYCHIATRIC:  Normal affect     Assessment/PLAN:    Coronary artery disease-no chest discomfort since being discharged from the hospital.  Nstemi-Underwent LHC 6-23.  She was noted to have mid LAD stenosis 65%.  She had not scoring balloon angioplasty and was placed on DAPT.  Feels that the Brilinta may be causing headaches.  Reported that she would contact us after 1 month of therapy to discuss symptoms. Continue Brilinta, co-Q10, aspirin, rosuvastatin Heart healthy low-sodium diet-salty 6 given Increase physical activity as tolerated  Essential hypertension-BP today 120/70.  Continues to be elevated at home.  Blood pressure better after exercise.  She was unable to tolerate indapamide due to urine retention.    Renal artery stenosis, details below.   Continue clonidine 0.1 mg 3 times daily and 0.2 mg at bedtime, l Change to lisinopril 20 mg a.m. and 20 mg p.m. Increase clonidine at bedtime  0.2 mg and continue 0.1 mg a.m. and at noon. Heart healthy low-sodium diet-salty 6 reviewed Increase physical activity as tolerated   Hyperlipidemia-11/07/2021: Cholesterol 132; HDL 64; LDL Cholesterol 63; Triglycerides 27; VLDL 5 Continue rosuvastatin, aspirin Heart healthy low-sodium high-fiber diet.   Increase physical activity as tolerated   Renal artery stenosis-moderate.  Details above.   Previously discussed consideration for PCSK9 inhibitor due to intolerance.  Recommendation for adding only 1 medication at a time due to multiple intolerances.  Continue aspirin, rosuvastatin    Disposition:   Follow-up with Dr. Oval Linsey in 3-4 months.    Medication Adjustments/Labs and Tests Ordered: Current medicines are reviewed at length with the patient today.  Concerns regarding medicines are outlined above.  No orders of the defined types were placed in this encounter.  No orders of the defined types were placed in this encounter.    Signed, Deberah Pelton, NP  11/17/2021 2:35 PM    [facility]

## 2021-11-17 ENCOUNTER — Other Ambulatory Visit: Payer: Self-pay | Admitting: General Practice

## 2021-11-17 ENCOUNTER — Ambulatory Visit (INDEPENDENT_AMBULATORY_CARE_PROVIDER_SITE_OTHER): Payer: Medicare Other | Admitting: General Practice

## 2021-11-17 ENCOUNTER — Encounter: Payer: Self-pay | Admitting: General Practice

## 2021-11-17 VITALS — BP 120/70 | HR 49 | Ht 65.0 in | Wt 118.6 lb

## 2021-11-17 DIAGNOSIS — I2510 Atherosclerotic heart disease of native coronary artery without angina pectoris: Secondary | ICD-10-CM

## 2021-11-17 DIAGNOSIS — I701 Atherosclerosis of renal artery: Secondary | ICD-10-CM | POA: Diagnosis not present

## 2021-11-17 DIAGNOSIS — I10 Essential (primary) hypertension: Secondary | ICD-10-CM

## 2021-11-17 DIAGNOSIS — E782 Mixed hyperlipidemia: Secondary | ICD-10-CM | POA: Diagnosis not present

## 2021-11-17 MED ORDER — LISINOPRIL 20 MG PO TABS
20.0000 mg | ORAL_TABLET | Freq: Two times a day (BID) | ORAL | 6 refills | Status: DC
Start: 2021-11-17 — End: 2021-11-18

## 2021-11-17 NOTE — Patient Instructions (Signed)
Medication Instructions:  INCREASE LISINOPRIL '20MG'$  TWICE DAILY  *If you need a refill on your cardiac medications before your next appointment, please call your pharmacy*  Lab Work:   Testing/Procedures:  NONE    NONE If you have labs (blood work) drawn today and your tests are completely normal, you will receive your results only by: [place] (if you have MyChart) OR  A paper copy in the mail If you have any lab test that is abnormal or we need to change your treatment, we will call you to review the results.  Special Instructions CALL IF HEADACHE CONTINUES FOR 1 MONTH.  Follow-Up: Your next appointment:  3-4 month(s) In Person with Skeet Latch, MD ONLY  At [facility], you and your health needs are our priority.  As part of our continuing mission to provide you with exceptional heart care, we have created designated Provider Care Teams.  These Care Teams include your primary Cardiologist (physician) and Advanced Practice Providers (APPs -  Physician Assistants and Nurse Practitioners) who all work together to provide you with the care you need, when you need it.  Important Information About Sugar

## 2021-11-18 LAB — BASIC METABOLIC PANEL
BUN/Creatinine Ratio: 24 (ref 12–28)
BUN: 15 mg/dL (ref 8–27)
CO2: 18 mmol/L — ABNORMAL LOW (ref 20–29)
Calcium: 9.4 mg/dL (ref 8.7–10.3)
Chloride: 97 mmol/L (ref 96–106)
Creatinine, Ser: 0.62 mg/dL (ref 0.57–1.00)
Glucose: 93 mg/dL (ref 70–99)
Potassium: 5.2 mmol/L (ref 3.5–5.2)
Sodium: 135 mmol/L (ref 134–144)
eGFR: 88 mL/min/{1.73_m2} (ref >59)

## 2021-11-20 DIAGNOSIS — L0390 Cellulitis, unspecified: Secondary | ICD-10-CM | POA: Diagnosis not present

## 2021-11-20 IMAGING — CT CT NECK W/ CM
3 of 4 series · 6 of 14 positions shown, 7 images · IV contrast (iopamidol)
Comparison: None.

CLINICAL DATA: Left neck mass.

EXAM:
CT NECK WITH CONTRAST
TECHNIQUE: Multidetector CT imaging of the neck was performed using the
standard protocol following the bolus administration of intravenous
contrast.
CONTRAST:  75mL [id number] IOPAMIDOL (BS99R6-MLW) INJECTION 76%

[Series 3: neck · axial · 0.52mm/px · z∈[-233,-145]mm · 2 of 133 slices shown, 3 images]
[im 45/133  soft-tissue]
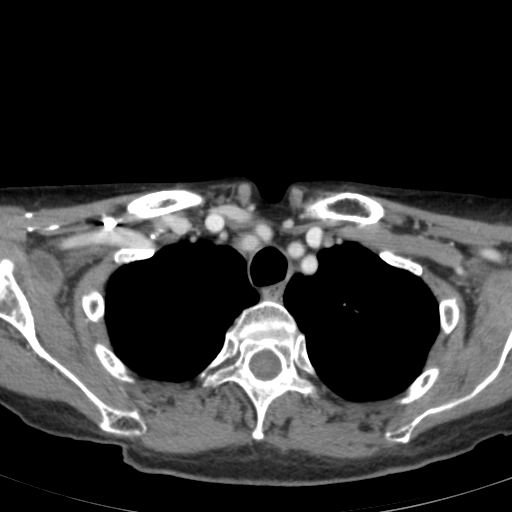
[im 45/133  bone]
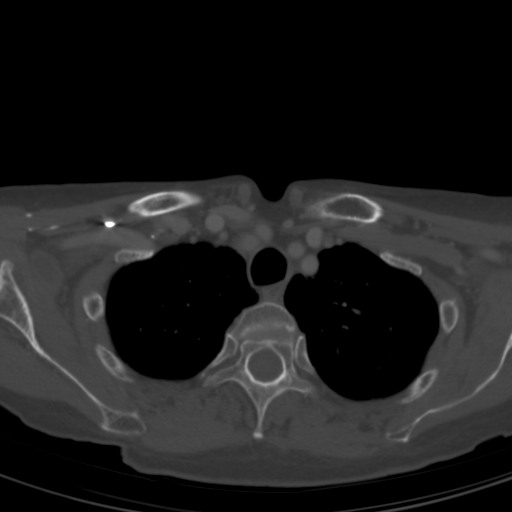
[im 89/133  bone]
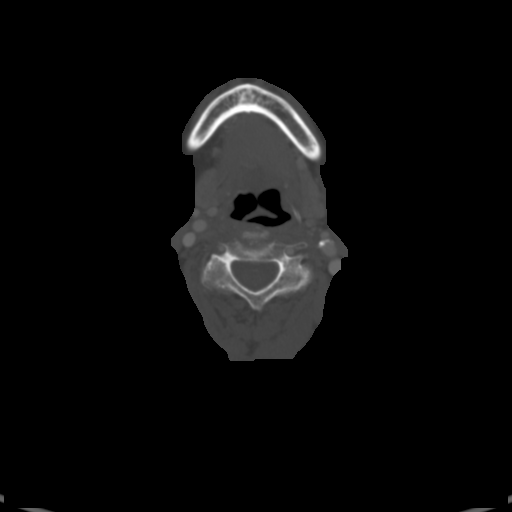

[Series 8: angled axial-oropharynx · axial · 0.39mm/px · z∈[-239,-151]mm · 2 of 133 slices shown]
[im 45/133  bone]
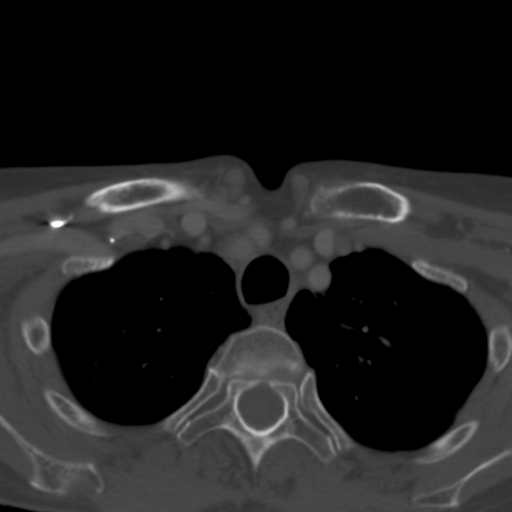
[im 89/133  bone]
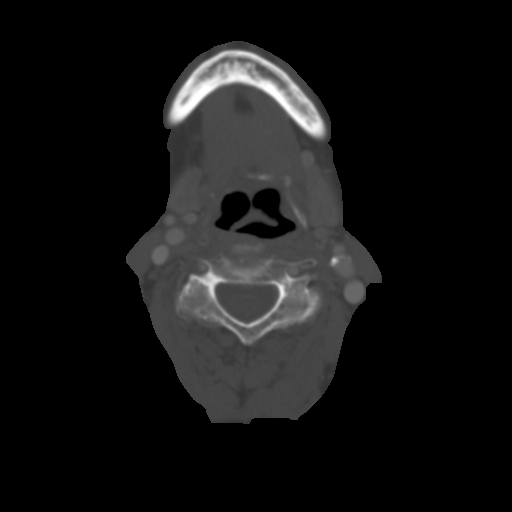

[Series 9: angled (person_name) · axial · 0.39mm/px · z∈[-239,-151]mm · 2 of 133 slices shown]
[im 45/133  bone]
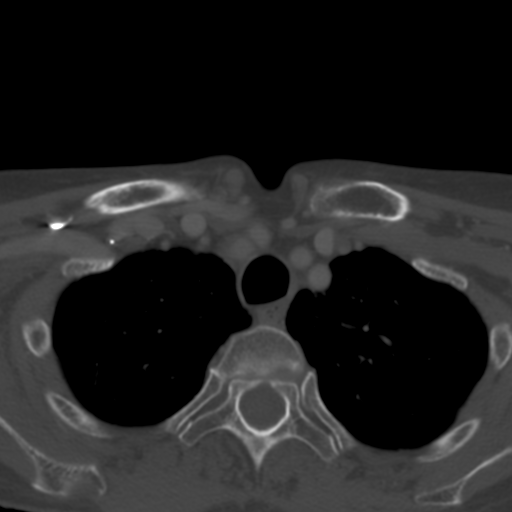
[im 89/133  bone]
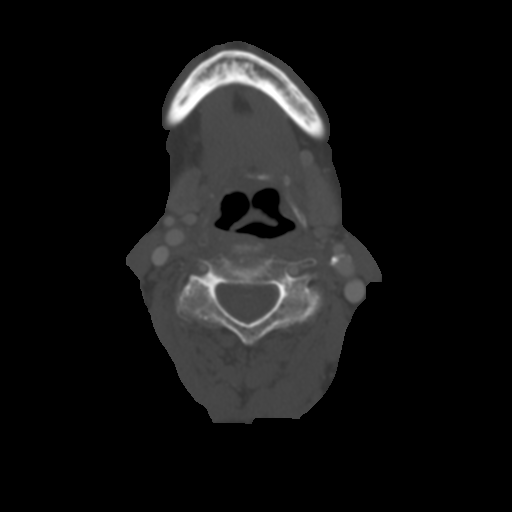

[6 of 14 positions shown; findings below may reference images not displayed]

FINDINGS: Pharynx and larynx: Normal. No mass or swelling.

Salivary glands: No inflammation, mass, or stone.

Thyroid: Small hypodense thyroid nodules measuring less than 5 mm.

Lymph nodes: None enlarged or abnormal density.

Vascular: Calcified plaques in the aortic arch, at the origin of the
bilateral vertebral arteries in the bilateral carotid bifurcation,
prominent at the left carotid bifurcation.

Limited intracranial: Negative.

Visualized orbits: Negative.

Mastoids and visualized paranasal sinuses: Near complete
opacification of the left maxillary sinus with small dystrophic
calcifications.

Skeleton: Degenerative changes of the cervical spine and
sternoclavicular and glenohumeral joints. Severe glenohumeral
osteoarthritis with large joint effusions bilaterally. Loose bodies
on the left in the subcoracoid recess.

Upper chest: Small partially calcified subpleural nodules
bilaterally measuring less than 5 mm.

Other: Subjacent to the skin in markers placed at the site of
patient's complaint, on the left side of neck, there is a nodular
appearing structure measuring approximately 10 x 4 mm, likely a
normal size lymph node (series 3, image 36).
IMPRESSION: 1. Subjacent to the skin in markers placed at the site of patient's
complaint, on the left side of the neck, there is a nodular
appearing structure measuring approximately 10 x 4 mm, likely a
normal size lymph node.
2. Calcified plaques in the bilateral carotid bifurcations, more
pronounced on the left.
3. Severe glenohumeral osteoarthritis with large joint effusions
bilaterally. Loose bodies on the left in the subcoracoid recess.
4. Near complete opacification of the left maxillary sinus with
small dystrophic calcifications, consistent with chronic sinusitis.

Aortic Atherosclerosis (XMPUD-NT3.3).

## 2021-11-21 ENCOUNTER — Telehealth: Payer: Self-pay | Admitting: Cardiovascular Disease

## 2021-11-21 ENCOUNTER — Ambulatory Visit (INDEPENDENT_AMBULATORY_CARE_PROVIDER_SITE_OTHER): Payer: Medicare Other | Admitting: Pharmacist Clinician (PhC)/ Clinical Pharmacy Specialist

## 2021-11-21 DIAGNOSIS — I701 Atherosclerosis of renal artery: Secondary | ICD-10-CM

## 2021-11-21 DIAGNOSIS — I10 Essential (primary) hypertension: Secondary | ICD-10-CM | POA: Diagnosis not present

## 2021-11-21 MED ORDER — LISINOPRIL 20 MG PO TABS: 20.0000 mg | ORAL_TABLET | Freq: Two times a day (BID) | ORAL | 11 refills | Status: DC

## 2021-11-21 NOTE — Telephone Encounter (Signed)
-  Pt called requesting a new prescription for lisinopril 20 mg BID.  -Pt state she is currently cutting 40 mg in half. -Per chart review, pt to take 20 mg BID. New Rx sent to pharmacy

## 2021-11-21 NOTE — Patient Instructions (Signed)
Check your blood pressure at home daily and keep record of the readings.  Take your BP meds as follows:  Continue with your current medications.  Bring all of your meds, your BP cuff and your record of home blood pressures to your next appointment.  Exercise as you're able, try to walk approximately 30 minutes per day.  Keep salt intake to a minimum, especially watch canned and prepared boxed foods.  Eat more fresh fruits and vegetables and fewer canned items.  Avoid eating in fast food restaurants.    HOW TO TAKE YOUR BLOOD PRESSURE: Rest 5 minutes before taking your blood pressure.  Don't smoke or drink caffeinated beverages for at least 30 minutes before. Take your blood pressure before (not after) you eat. Sit comfortably with your back supported and both feet on the floor (don't cross your legs). Elevate your arm to heart level on a table or a desk. Use the proper sized cuff. It should fit smoothly and snugly around your bare upper arm. There should be enough room to slip a fingertip under the cuff. The bottom edge of the cuff should be 1 inch above the crease of the elbow. Ideally, take 3 measurements at one sitting and record the average.   

## 2021-11-21 NOTE — Progress Notes (Signed)
11/25/2021 Kendra [name] 1935/12/14 [id number]   HPI:  Kendra [name] is a 86 y.o. female patient of Dr Oval Linsey, with a [place] below who presents today for hypertension clinic evaluation.  She was seen by Dr. Oval Linsey 05/05/2021.  She was seen as part of the advanced hypertension clinic.  Her blood pressure was 214/76.  She continued to report intolerance of multiple antihypertensive medications.  Her losartan was switched to irbesartan 150 twice daily.  Hydralazine was discontinued and she was started on spironolactone.  She was instructed to start taking clonidine every 8 hours.  She noticed that her blood pressure was increasing at night.  She had noticed this for 30 years but reported that it was more prominent recently.  She was taking her evening medication around 10 PM.  She would recheck her blood pressure around 3 AM and noted blood pressures in the 409-811 systolic range.  Her blood pressure was much better after exercise.  She noted that it had been in the 914-782 systolic range at night.  She reported increased hair loss since increasing clonidine to 3 times daily.  She stopped taking spironolactone due to her increased urination.  She did not note improvement in her blood pressure and felt sluggish.  She denied snoring and her husband have not noted apnea.  She denied daytime somnolence and was puzzled as to why her blood pressure would be elevated at night.  When she saw Odie Sera last month she was asked to avoid caffeine, and he increased the clonidine at bedtime to 0.2 mg, with 0.1 mg still in the mornings and at mid-day.  She instead took 0.1 mg at bedtime then another 0.1 mg around 3 am.  She feels that the combination of medications is responsible for alopecia.    At last visit switched clonidine tablets to patches. Patient was given instructions on tapering tablets for a smooth transition.  She returns today for follow up.  She only used the patches for 2-3 weeks, then  stopped because they "probably were causing problems".  She felt as though the medication wasn't being evenly distributed over 24 hours, and therefore went back to taking 0.1 mg qid.  She is also adamant that the irbesartan is causing problems.  States she looked up side effects on multiple reliable websites and believes it is the cause of her recent change in vision prescription, constipation and runny nose.  She also believes it is causing her hearing to fade - she is having more trouble hearing higher pitches.  I tried to suggest that much of this could just be a function of aging, but she wants something different.  She continues to check home BP four times daily despite [place] asking her to check no more than twice.    Today she returns for follow up.  She was seen last month by Dr. Oval Linsey but had not started the lisinopril that I prescribed in April.  She was again encouraged to do so.  On June 1 she was admitted to [facility] with NSTEMI and found to have a 65% blocked mid-LAD lesion that was treating with balloon angioplasty.  She was sent home on lisinopril (which she had not yet started on admit).  Was seen by Coletta Memos NP  earlier this week for hospital [facility] visit.  BP was good at 120/70.  She had significant bruising on her right arm from the cath procedure.  She was taking lisinopril, but felt the dose wasn't  lasting all day, so divided to 20 mg twice daily.   Today she is in for hypertension follow up.  Has been taking the lisinopril twice daily for just a few days and thinks it may be causing stomach upset.     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%, right - 2 arteries, larger 40% blocked  ASCVD 2008 - overlapping stents to LAD; left ICA 40-59% stenosis; 50% bilateral common iliac stenosis  hyperlipidemia On rosuvastatin 10 qod; ideally needs PCSK-9     Blood Pressure Goal:  130/80  Current Medications: lisinopril 40 mg  clonidine 0.1 mg qid  Family Hx: both parents  - mother had 3 MI  between 60-75; father died CHF, DM2;1 sister healthy; daughter healthy in her 11's  Social Hx: no tobacco, no alcohol, gave up dark chocolate, coffee is decaf - [employer] , now has tea in afternoon;   Diet: mix of home cooked, eats out 1-2 times per week; pescitarian, egg white, otherwise vegetarian; vegetables fresh or frozen, beans and almond cheese for protein  Exercise: walks when able (weather dependent), does own housework, tai chi  Home BP readings: patient continues to check 2-5 times per day.  Most readings range 716-967'E systolic, with lowest at 938, highest at 180,      Intolerances:   Chlorthalidone - hyponatremia  Amlodipine - shivers  Hydralazine - headaches (in higher doses)  Spironolactone - frequent urination, felt sluggish  All diuretics - urinary problems  Irbesartan - change in vision, runny nose     Labs: 12/22:  Na 133, K 5.0, Glu 94, BN 13, SCr 0.65, GFR 87   Wt Readings from Last 3 Encounters:  11/21/21 118 lb 9.6 oz (53.8 kg)  11/17/21 118 lb 9.6 oz (53.8 kg)  11/06/21 117 lb 1 oz (53.1 kg)   BP Readings from Last 3 Encounters:  11/21/21 138/71  11/17/21 120/70  11/08/21 (!) 149/71   Pulse Readings from Last 3 Encounters:  11/21/21 (!) 59  11/17/21 (!) 49  11/08/21 64    Current Outpatient Medications  Medication Sig Dispense Refill   albuterol (VENTOLIN HFA) 108 (90 Base) MCG/ACT inhaler Inhale 1-2 puffs into the lungs every 6 (six) hours as needed for wheezing or shortness of breath.     Ascorbic Acid (VITAMIN C) 1000 MG tablet Take 1,000 mg by mouth daily.     aspirin EC 81 MG tablet Take 81 mg by mouth daily. Swallow whole.     beclomethasone (QVAR) 80 MCG/ACT inhaler Inhale 1-2 puffs into the lungs 2 (two) times daily.     BIOTIN 5000 PO Take 1 tablet by mouth daily.     BLACK ELDERBERRY PO Take 575 mg by mouth daily.     calcium carbonate (OS-CAL - DOSED IN MG OF ELEMENTAL CALCIUM) 1250 (500 Ca) MG tablet Take 1 tablet by mouth daily.      cholecalciferol (VITAMIN D) 1000 UNITS tablet Take 1,000 Units by mouth daily.     clobetasol (OLUX) 0.05 % topical foam Apply 1 application. topically daily as needed (sensitive scalp).     cloNIDine (CATAPRES) 0.1 MG tablet TAKE 1 TABLET THREE TIMES A DAY AND 2 TABLETS AT BEDTIME (Patient taking differently: Take 0.1-0.2 mg by mouth See admin instructions. Take 1 tab ( 0.1 mg) three times daily and 2 tabs ( 0.2 mg) at bedtime.) 150 tablet 5   Coenzyme Q10 (CO Q-10) 100 MG CAPS Take 100 mg by mouth daily.      docusate  sodium (COLACE) 100 MG capsule Take 100 mg by mouth daily as needed.     fluticasone (FLONASE) 50 MCG/ACT nasal spray Place 1 spray into both nostrils daily as needed for allergies.     Homeopathic Products (ARNICA EX) Apply 1 application. topically daily as needed (arthritis). Sore areas     lisinopril (ZESTRIL) 20 MG tablet Take 1 tablet (20 mg total) by mouth 2 (two) times daily. 60 tablet 11   Menthol-Camphor (TIGER BALM NECK & SHOULDER RUB) 10-11 % CREA Apply 1 application. topically daily as needed (arthritis pain).     Misc Natural Products (ELDERBERRY ZINC/VIT C/IMMUNE MT) Use as directed 1 tablet in the mouth or throat daily.     Multiple Vitamin (MULTIVITAMIN) capsule Take 1 capsule by mouth daily.     nitroGLYCERIN (NITROSTAT) 0.4 MG SL tablet Place 1 tablet (0.4 mg total) under the tongue every 5 (five) minutes x 3 doses as needed for chest pain. 25 tablet 2   polyethylene glycol (MIRALAX / GLYCOLAX) 17 g packet Take 17 g by mouth daily.     rosuvastatin (CRESTOR) 10 MG tablet Take 10 mg by mouth every other day.     ticagrelor (BRILINTA) 90 MG TABS tablet Take 1 tablet (90 mg total) by mouth 2 (two) times daily. 60 tablet 11   triamcinolone (KENALOG) 0.1 % Apply 1 application. topically 2 (two) times daily as needed (rash, itching).     No current facility-administered medications for this visit.    Allergies  Allergen Reactions   Chlorthalidone Other (See Comments)     Hyponatremia   Other     Other reaction(s): Respiratory Distress   Amlodipine     "shivers"    Ciprofloxacin Other (See Comments)    UNK reaction   Dog Epithelium Allergy Skin Test    Latex Other (See Comments)    Irritates her skin   Statins     Unable to tolerate statins w the exception of crestor.   Sulfa Antibiotics Other (See Comments)    "didnt feel good"   Sulfamethoxazole Other (See Comments)    Makes her feel bad   Zetia [Ezetimibe] Other (See Comments)    "makes me constipated"   Penicillins Rash    Past Medical History:  Diagnosis Date   Asthma    CAD (coronary artery disease)    last cath 08/2006   Carotid artery disease (HCC)    moderate left ICA stenosis by 2.6 ultrasound   DJD (degenerative joint disease), lumbar    Hearing loss    HTN (hypertension)    Hyperlipidemia    Renal artery stenosis ([place]) 05/05/2021   Vision loss of right eye     Blood pressure 138/71, pulse (!) 59, weight 118 lb 9.6 oz (53.8 kg).  Essential hypertension Patient with hard to treat hypertension secondary to multiple medication intolerances.  Because she has been on the twice daily lisinopril only for a few days, will not make any medication changes at this time.  She has an appointment with Dr. Oval Linsey in 4 months and we can follow up as needed after that.    Tommy Medal PharmD CPP [facility] [address] [place], [place] [address] [phone]

## 2021-11-21 NOTE — Telephone Encounter (Signed)
Pt c/o medication issue:  1. Name of Medication:   lisinopril (ZESTRIL) 20 MG tablet    2. How are you currently taking this medication (dosage and times per day)? TAKE 1 TABLET (20 MG TOTAL) BY MOUTH 2 (TWO) TIMES DAILY. TAKE 1/2 TABLET TWICE A DAY  3. Are you having a reaction (difficulty breathing--STAT)? No  4. What is your medication issue? Pt would like to know if medication can be changed to a 20 mg tablet. Pt states that she does not feel comfortable breaking ( 40 mg) tablet in half. Please advise

## 2021-11-21 NOTE — Telephone Encounter (Signed)
Pt. Last seen by Denyse Amass cleaver at [place]

## 2021-11-24 ENCOUNTER — Other Ambulatory Visit (HOSPITAL_COMMUNITY): Payer: Self-pay | Admitting: Cardiovascular Disease

## 2021-11-24 DIAGNOSIS — I6522 Occlusion and stenosis of left carotid artery: Secondary | ICD-10-CM

## 2021-11-25 ENCOUNTER — Encounter (HOSPITAL_BASED_OUTPATIENT_CLINIC_OR_DEPARTMENT_OTHER): Payer: Self-pay | Admitting: Pharmacist Clinician (PhC)/ Clinical Pharmacy Specialist

## 2021-11-25 NOTE — Assessment & Plan Note (Signed)
Patient with hard to treat hypertension secondary to multiple medication intolerances.  Because she has been on the twice daily lisinopril only for a few days, will not make any medication changes at this time.  She has an appointment with Dr. Oval Linsey in 4 months and we can follow up as needed after that.

## 2021-11-26 ENCOUNTER — Encounter: Payer: Self-pay | Admitting: Podiatry

## 2021-11-26 ENCOUNTER — Ambulatory Visit (INDEPENDENT_AMBULATORY_CARE_PROVIDER_SITE_OTHER): Payer: Medicare Other | Admitting: Podiatry

## 2021-11-26 DIAGNOSIS — M79675 Pain in left toe(s): Secondary | ICD-10-CM | POA: Diagnosis not present

## 2021-11-26 DIAGNOSIS — I872 Venous insufficiency (chronic) (peripheral): Secondary | ICD-10-CM | POA: Diagnosis not present

## 2021-11-26 DIAGNOSIS — B351 Tinea unguium: Secondary | ICD-10-CM | POA: Diagnosis not present

## 2021-11-26 DIAGNOSIS — D689 Coagulation defect, unspecified: Secondary | ICD-10-CM

## 2021-11-26 DIAGNOSIS — M79674 Pain in right toe(s): Secondary | ICD-10-CM

## 2021-11-26 NOTE — Progress Notes (Signed)
  Subjective:  Patient ID: Kendra [name], female    DOB: 15-Nov-1935,   MRN: [id number]  Chief Complaint  Patient presents with   Nail Problem    Small toe on left foot is split down the middle. All toes need trimming as most are crooked and hard for me to trim    86 y.o. female presents for concern of toenail fungus. Requesting to have nails trimmed today relates she was recently started on a blood thinner after procedure done in January. . Denies any other pedal complaints. Denies n/v/f/c.   Past Medical History:  Diagnosis Date   Asthma    CAD (coronary artery disease)    last cath 08/2006   Carotid artery disease (HCC)    moderate left ICA stenosis by 2.6 ultrasound   DJD (degenerative joint disease), lumbar    Hearing loss    HTN (hypertension)    Hyperlipidemia    Renal artery stenosis ([place]) 05/05/2021   Vision loss of right eye     Objective:  Physical Exam: Vascular: DP/PT pulses 2/4 bilateral. CFT <3 seconds. Normal hair growth on digits. No edema.  Skin. No lacerations or abrasions bilateral feet. Nails 1-5 are thickened discolored and elongated with subungual debris on left. Right appear normal thickness color and length. Musculoskeletal: MMT 5/5 bilateral lower extremities in DF, PF, Inversion and Eversion. Deceased ROM in DF of ankle joint.  Neurological: Sensation intact to light touch.   Assessment:   1. Pain due to onychomycosis of toenails of both feet   2. Peripheral venous insufficiency   3. Coagulation defect [facility])        Plan:  Patient was evaluated and treated and all questions answered. -Examined patient -Discussed treatment options for painful dystrophic nails  --Discussed and educated patient on foot care, especially with  regards to the vascular, neurological and musculoskeletal systems.  -Discussed supportive shoes at all times and checking feet regularly.  -Mechanically debrided all nails 1-5 bilateral using sterile nail nipper and  filed with dremel without incident  -Answered all patient questions -Patient to return  in 3 months for at risk foot care -Patient advised to call the office if any problems or questions arise in the meantime.    Lorenda Peck, DPM

## 2021-12-04 ENCOUNTER — Telehealth: Payer: Self-pay | Admitting: Cardiovascular Disease

## 2021-12-04 NOTE — Telephone Encounter (Signed)
Pt c/o medication issue:  1. Name of Medication: ticagrelor (BRILINTA) 90 MG TABS tablet  2. How are you currently taking this medication (dosage and times per day)? Take 1 tablet (90 mg total) by mouth 2 (two) times daily.  3. Are you having a reaction (difficulty breathing--STAT)? No   4. What is your medication issue? Patient would like to change from  ticagrelor (BRILINTA) 90 MG TABS tablet to Plavix due to cost. Patient says that she only have 4 doses left and would like to make the change ASAP

## 2021-12-04 NOTE — Telephone Encounter (Signed)
Please advise if okay to make medication switch

## 2021-12-08 DIAGNOSIS — M25521 Pain in right elbow: Secondary | ICD-10-CM | POA: Diagnosis not present

## 2021-12-08 DIAGNOSIS — I2510 Atherosclerotic heart disease of native coronary artery without angina pectoris: Secondary | ICD-10-CM | POA: Diagnosis not present

## 2021-12-08 DIAGNOSIS — I10 Essential (primary) hypertension: Secondary | ICD-10-CM | POA: Diagnosis not present

## 2021-12-08 MED ORDER — CLOPIDOGREL BISULFATE 75 MG PO TABS: 75.0000 mg | ORAL_TABLET | Freq: Every day | ORAL | 6 refills | Status: DC

## 2021-12-08 NOTE — Telephone Encounter (Signed)
Called pt notified of Dr Blenda Mounts message she states that her Dr Harrington Challenger (PCP) already sent an rx for it, she states that the Plavix tomorrow.

## 2021-12-10 ENCOUNTER — Telehealth (HOSPITAL_BASED_OUTPATIENT_CLINIC_OR_DEPARTMENT_OTHER): Payer: Self-pay

## 2021-12-10 NOTE — Telephone Encounter (Signed)
Received fax from [facility] ([place]) requesting to switch from Brilinta to Plavix. Note on the fax from the patient states she only has three days left of the Brilinta and she wants to go ahead and make the switch to Plavix.   Looks like same request was made last week---per tele note from 12/04/21  Per Dr. Oval Linsey: If there aren't any applicable patient assistance programs, OK to switch.  Can we get her samples to get through at least 3 months?  When she does transition, take clopidogrel '150mg'$  x1 then '75mg'$  daily the next day.    Waylan Rocher, LPN      10/09/94  7:28 PM Note Called pt notified of Dr Blenda Mounts message she states that her Dr Harrington Challenger (PCP) already sent an rx for it, she states that the Plavix tomorrow.      Please advise. Thank you!

## 2021-12-11 DIAGNOSIS — I10 Essential (primary) hypertension: Secondary | ICD-10-CM | POA: Diagnosis not present

## 2021-12-11 DIAGNOSIS — J4540 Moderate persistent asthma, uncomplicated: Secondary | ICD-10-CM | POA: Diagnosis not present

## 2021-12-11 DIAGNOSIS — I25118 Atherosclerotic heart disease of native coronary artery with other forms of angina pectoris: Secondary | ICD-10-CM | POA: Diagnosis not present

## 2021-12-11 DIAGNOSIS — E782 Mixed hyperlipidemia: Secondary | ICD-10-CM | POA: Diagnosis not present

## 2021-12-12 ENCOUNTER — Other Ambulatory Visit (HOSPITAL_BASED_OUTPATIENT_CLINIC_OR_DEPARTMENT_OTHER): Payer: Self-pay

## 2021-12-12 DIAGNOSIS — R5383 Other fatigue: Secondary | ICD-10-CM | POA: Diagnosis not present

## 2021-12-12 DIAGNOSIS — R0981 Nasal congestion: Secondary | ICD-10-CM | POA: Diagnosis not present

## 2021-12-12 DIAGNOSIS — R630 Anorexia: Secondary | ICD-10-CM | POA: Diagnosis not present

## 2021-12-12 DIAGNOSIS — I2510 Atherosclerotic heart disease of native coronary artery without angina pectoris: Secondary | ICD-10-CM

## 2021-12-12 DIAGNOSIS — U071 COVID-19: Secondary | ICD-10-CM | POA: Diagnosis not present

## 2021-12-12 DIAGNOSIS — J069 Acute upper respiratory infection, unspecified: Secondary | ICD-10-CM | POA: Diagnosis not present

## 2021-12-12 NOTE — Telephone Encounter (Signed)
Called pt to clarify. She stated she has already made the switch from Brilinta to Plavix. Dr. Harrington Challenger (PCP) sent it in for her starting '75mg'$  daily. She stated she was not given the loading dose of 150 mg because the Brilinta had caused a lot of bruising.  Pt no longer taking Brilinta. Taking Plavix 75 mg daily. No refills needed at this time.

## 2021-12-15 ENCOUNTER — Encounter (HOSPITAL_BASED_OUTPATIENT_CLINIC_OR_DEPARTMENT_OTHER): Payer: Self-pay | Admitting: Cardiovascular Disease

## 2021-12-15 ENCOUNTER — Telehealth: Payer: Self-pay | Admitting: Cardiovascular Disease

## 2021-12-15 DIAGNOSIS — R000 Tachycardia, unspecified: Secondary | ICD-10-CM

## 2021-12-15 DIAGNOSIS — Z5181 Encounter for therapeutic drug level monitoring: Secondary | ICD-10-CM

## 2021-12-15 NOTE — Telephone Encounter (Signed)
Patient came down with COVID on 12/10/21, it was diagnosed on Friday 12/12/21. She started taking the anti-viral medication on 12/13/21.   Every since then when she checks her BP, the HR shows an irregular heart beat.  Her question is this COVID related A-Fib and does she need additional medication.

## 2021-12-15 NOTE — Telephone Encounter (Signed)
Patient also sent mychart message, responded to that

## 2021-12-16 NOTE — Telephone Encounter (Signed)
Please advise 

## 2021-12-17 ENCOUNTER — Ambulatory Visit (INDEPENDENT_AMBULATORY_CARE_PROVIDER_SITE_OTHER): Payer: Medicare Other

## 2021-12-17 DIAGNOSIS — R000 Tachycardia, unspecified: Secondary | ICD-10-CM

## 2021-12-17 MED ORDER — METOPROLOL SUCCINATE ER 25 MG PO TB24
25.0000 mg | ORAL_TABLET | Freq: Every day | ORAL | 3 refills | Status: DC
Start: 2021-12-17 — End: 2022-01-15

## 2021-12-17 NOTE — Telephone Encounter (Signed)
Skeet Latch, MD  You; Gerald Stabs, RN 8 minutes ago (12:42 PM)    Please have her wear a 3 day Zio.  Start metoprolol succinate '25mg'$  daily after the first day on the monitor.   TCR

## 2021-12-17 NOTE — Progress Notes (Unsigned)
Enrolled patient for a 3 day Zio XT monitor to be mailed to patients home  

## 2021-12-19 DIAGNOSIS — R000 Tachycardia, unspecified: Secondary | ICD-10-CM | POA: Diagnosis not present

## 2021-12-19 DIAGNOSIS — U071 COVID-19: Secondary | ICD-10-CM | POA: Diagnosis not present

## 2021-12-19 DIAGNOSIS — I4819 Other persistent atrial fibrillation: Secondary | ICD-10-CM | POA: Diagnosis not present

## 2021-12-22 DIAGNOSIS — U071 COVID-19: Secondary | ICD-10-CM | POA: Diagnosis not present

## 2021-12-22 NOTE — Telephone Encounter (Signed)
Please advise 

## 2021-12-23 DIAGNOSIS — M25521 Pain in right elbow: Secondary | ICD-10-CM | POA: Diagnosis not present

## 2021-12-23 DIAGNOSIS — M79644 Pain in right finger(s): Secondary | ICD-10-CM | POA: Diagnosis not present

## 2021-12-30 DIAGNOSIS — R000 Tachycardia, unspecified: Secondary | ICD-10-CM | POA: Diagnosis not present

## 2021-12-31 DIAGNOSIS — M25462 Effusion, left knee: Secondary | ICD-10-CM | POA: Diagnosis not present

## 2021-12-31 DIAGNOSIS — M25562 Pain in left knee: Secondary | ICD-10-CM | POA: Diagnosis not present

## 2021-12-31 DIAGNOSIS — M170 Bilateral primary osteoarthritis of knee: Secondary | ICD-10-CM | POA: Diagnosis not present

## 2021-12-31 DIAGNOSIS — M1712 Unilateral primary osteoarthritis, left knee: Secondary | ICD-10-CM | POA: Diagnosis not present

## 2022-01-05 DIAGNOSIS — Z5181 Encounter for therapeutic drug level monitoring: Secondary | ICD-10-CM | POA: Diagnosis not present

## 2022-01-05 DIAGNOSIS — R000 Tachycardia, unspecified: Secondary | ICD-10-CM | POA: Diagnosis not present

## 2022-01-06 LAB — BASIC METABOLIC PANEL
BUN/Creatinine Ratio: 27 (ref 12–28)
BUN: 17 mg/dL (ref 8–27)
CO2: 24 mmol/L (ref 20–29)
Calcium: 9.6 mg/dL (ref 8.7–10.3)
Chloride: 97 mmol/L (ref 96–106)
Creatinine, Ser: 0.64 mg/dL (ref 0.57–1.00)
Glucose: 108 mg/dL — ABNORMAL HIGH (ref 70–99)
Potassium: 5.3 mmol/L — ABNORMAL HIGH (ref 3.5–5.2)
Sodium: 135 mmol/L (ref 134–144)
eGFR: 88 mL/min/{1.73_m2} (ref >59)

## 2022-01-09 ENCOUNTER — Encounter (HOSPITAL_BASED_OUTPATIENT_CLINIC_OR_DEPARTMENT_OTHER): Payer: Self-pay | Admitting: *Deleted

## 2022-01-09 ENCOUNTER — Other Ambulatory Visit (HOSPITAL_BASED_OUTPATIENT_CLINIC_OR_DEPARTMENT_OTHER): Payer: Self-pay | Admitting: *Deleted

## 2022-01-09 DIAGNOSIS — E875 Hyperkalemia: Secondary | ICD-10-CM

## 2022-01-12 ENCOUNTER — Telehealth (HOSPITAL_COMMUNITY): Payer: Self-pay

## 2022-01-14 DIAGNOSIS — D692 Other nonthrombocytopenic purpura: Secondary | ICD-10-CM | POA: Diagnosis not present

## 2022-01-14 DIAGNOSIS — L649 Androgenic alopecia, unspecified: Secondary | ICD-10-CM | POA: Diagnosis not present

## 2022-01-14 DIAGNOSIS — L680 Hirsutism: Secondary | ICD-10-CM | POA: Diagnosis not present

## 2022-01-15 ENCOUNTER — Encounter (HOSPITAL_COMMUNITY)
Admission: RE | Admit: 2022-01-15 | Discharge: 2022-01-15 | Disposition: A | Discharge status: Home / Self Care | Payer: Medicare Other | Source: Ambulatory Visit | Attending: Cardiovascular Disease | Admitting: Cardiovascular Disease

## 2022-01-15 ENCOUNTER — Encounter (HOSPITAL_BASED_OUTPATIENT_CLINIC_OR_DEPARTMENT_OTHER): Payer: Self-pay

## 2022-01-15 ENCOUNTER — Telehealth: Payer: Self-pay | Admitting: Cardiovascular Disease

## 2022-01-15 DIAGNOSIS — Z9861 Coronary angioplasty status: Secondary | ICD-10-CM | POA: Insufficient documentation

## 2022-01-15 DIAGNOSIS — I4819 Other persistent atrial fibrillation: Secondary | ICD-10-CM | POA: Insufficient documentation

## 2022-01-15 DIAGNOSIS — I4891 Unspecified atrial fibrillation: Secondary | ICD-10-CM

## 2022-01-15 DIAGNOSIS — I214 Non-ST elevation (NSTEMI) myocardial infarction: Secondary | ICD-10-CM | POA: Diagnosis not present

## 2022-01-15 MED ORDER — APIXABAN 5 MG PO TABS: 5.0000 mg | ORAL_TABLET | Freq: Two times a day (BID) | ORAL | 1 refills | Status: DC

## 2022-01-15 MED ORDER — METOPROLOL SUCCINATE ER 50 MG PO TB24: 50.0000 mg | ORAL_TABLET | Freq: Every day | ORAL | 1 refills | Status: DC

## 2022-01-15 NOTE — Telephone Encounter (Signed)
Calling to speak with nurse regarding pt currently being in their office and in Afib. Please advise

## 2022-01-15 NOTE — Telephone Encounter (Signed)
During her stop at the office to pick up her samples, pt showed multiple, various sized bruising on her arms and legs. Dr Oval Linsey aware of bruising concerns. Georgana Curio MHA RN CCM

## 2022-01-15 NOTE — Telephone Encounter (Signed)
Our office was noted that the patient was in atrial fibrillation at cardiac rehab.  Her heart rates were in the 100s to 120s.  She was asymptomatic.  Blood pressure stable.  Recommended that she go to the emergency department and she declined.  We will have her stop her aspirin and start Eliquis 5 mg twice daily.  Increase metoprolol to 50 mg.  We will arrange for her to be seen in atrial fibrillation clinic on Monday.  If symptoms worsen over the weekend, she needs to be seen in the emergency department.  Layani Foronda C. Oval Linsey, MD, [facility] 01/15/2022 3:21 PM

## 2022-01-15 NOTE — Progress Notes (Addendum)
Patient reported for cardiac rehab orientation appears to be in atrial fibrillation rate 100. Blood pressure 122/58. Oxygen saturation 99% on room air. Patient asymptomatic currently. Blood pressure 122/58. Oxygen saturation 98% on room air. Patient said that she wore a zio patch and had not been told the results yet. Vin Bhagat PAC paged and notified. 12 lead ordered and obtained. Dr Blenda Mounts office called and notified per Vin's request. Spoke with Georgana Curio RN. Dr Oval Linsey reviewed the 12 lead ECG whch confirmed  and wants the patient to come to [place] to pick up Eliquis samples. Patient was noted to have scattered bruises on bilateral arms. Dr Blenda Mounts office notified. Did not proceed with cardiac rehab orientation or the 6 minute walk test today. Will cancel all appointments for cardiac rehab at this time. Mrs Goodie spoke with Dr Blenda Mounts nurses over the phone regarding her medication changes and instructions. An appointment will be arranged for the patient to follow up at the [facility] on Monday. The patient left cardiac rehab without complaints. Exit heart rate 104. Oxygen saturation 99%.Barnet Pall, RN,BSN 01/15/2022 4:00 PM

## 2022-01-15 NOTE — Telephone Encounter (Signed)
Pt scheduled with Afib clinic Monday January 19, 2022 at 2 PM.   Pt agreed to appt at [facility] clinic.  Phone number to Afib clinic provided for patient to contact for directions on how best to access clinic due to construction. Pt instructed to come to the office at [facility] to pick up Eliquis samples.  Prescriptions for Eliquis and metoprolol dose change sent to pharmacy. Georgana Curio MHA RN CCM

## 2022-01-16 DIAGNOSIS — H401233 Low-tension glaucoma, bilateral, severe stage: Secondary | ICD-10-CM | POA: Diagnosis not present

## 2022-01-16 LAB — COMPREHENSIVE METABOLIC PANEL
ALT: 25 IU/L (ref 0–32)
AST: 28 IU/L (ref 0–40)
Albumin/Globulin Ratio: 1.7 (ref 1.2–2.2)
Albumin: 4.2 g/dL (ref 3.7–4.7)
Alkaline Phosphatase: 79 IU/L (ref 44–121)
BUN/Creatinine Ratio: 20 (ref 12–28)
BUN: 14 mg/dL (ref 8–27)
Bilirubin Total: 0.4 mg/dL (ref 0.0–1.2)
CO2: 24 mmol/L (ref 20–29)
Calcium: 9.4 mg/dL (ref 8.7–10.3)
Chloride: 99 mmol/L (ref 96–106)
Creatinine, Ser: 0.7 mg/dL (ref 0.57–1.00)
Globulin, Total: 2.5 g/dL (ref 1.5–4.5)
Glucose: 111 mg/dL — ABNORMAL HIGH (ref 70–99)
Potassium: 4.9 mmol/L (ref 3.5–5.2)
Sodium: 138 mmol/L (ref 134–144)
Total Protein: 6.7 g/dL (ref 6.0–8.5)
eGFR: 86 mL/min/{1.73_m2} (ref >59)

## 2022-01-16 LAB — CBC WITH DIFFERENTIAL/PLATELET
Basophils Absolute: 0.1 10*3/uL (ref 0.0–0.2)
Basos: 1 %
EOS (ABSOLUTE): 0.1 10*3/uL (ref 0.0–0.4)
Eos: 1 %
Hematocrit: 43.2 % (ref 34.0–46.6)
Hemoglobin: 14.3 g/dL (ref 11.1–15.9)
Immature Grans (Abs): 0 10*3/uL (ref 0.0–0.1)
Immature Granulocytes: 0 %
Lymphocytes Absolute: 1.7 10*3/uL (ref 0.7–3.1)
Lymphs: 16 %
MCH: 29.2 pg (ref 26.6–33.0)
MCHC: 33.1 g/dL (ref 31.5–35.7)
MCV: 88 fL (ref 79–97)
Monocytes Absolute: 0.7 10*3/uL (ref 0.1–0.9)
Monocytes: 6 %
Neutrophils Absolute: 8.1 10*3/uL — ABNORMAL HIGH (ref 1.4–7.0)
Neutrophils: 76 %
Platelets: 284 10*3/uL (ref 150–450)
RBC: 4.89 x10E6/uL (ref 3.77–5.28)
RDW: 14 % (ref 11.7–15.4)
WBC: 10.7 10*3/uL (ref 3.4–10.8)

## 2022-01-16 LAB — MAGNESIUM: Magnesium: 2.1 mg/dL (ref 1.6–2.3)

## 2022-01-16 LAB — TSH: TSH: 1.97 u[IU]/mL (ref 0.450–4.500)

## 2022-01-16 LAB — T4, FREE: Free T4: 1.22 ng/dL (ref 0.82–1.77)

## 2022-01-19 ENCOUNTER — Other Ambulatory Visit: Payer: Self-pay | Admitting: Family Medicine

## 2022-01-19 ENCOUNTER — Other Ambulatory Visit (HOSPITAL_COMMUNITY): Payer: Self-pay | Admitting: Physician Assistant

## 2022-01-19 ENCOUNTER — Ambulatory Visit (HOSPITAL_COMMUNITY): Payer: Medicare Other

## 2022-01-19 ENCOUNTER — Ambulatory Visit (HOSPITAL_COMMUNITY)
Admission: RE | Admit: 2022-01-19 | Discharge: 2022-01-19 | Disposition: A | Discharge status: Home / Self Care | Payer: Medicare Other | Source: Ambulatory Visit | Attending: Physician Assistant | Admitting: Physician Assistant

## 2022-01-19 VITALS — BP 136/86 | HR 109 | Ht 65.0 in | Wt 118.6 lb

## 2022-01-19 DIAGNOSIS — I4819 Other persistent atrial fibrillation: Secondary | ICD-10-CM

## 2022-01-19 DIAGNOSIS — E785 Hyperlipidemia, unspecified: Secondary | ICD-10-CM | POA: Diagnosis not present

## 2022-01-19 DIAGNOSIS — I701 Atherosclerosis of renal artery: Secondary | ICD-10-CM | POA: Insufficient documentation

## 2022-01-19 DIAGNOSIS — Z7901 Long term (current) use of anticoagulants: Secondary | ICD-10-CM | POA: Insufficient documentation

## 2022-01-19 DIAGNOSIS — D6869 Other thrombophilia: Secondary | ICD-10-CM

## 2022-01-19 DIAGNOSIS — I2510 Atherosclerotic heart disease of native coronary artery without angina pectoris: Secondary | ICD-10-CM | POA: Insufficient documentation

## 2022-01-19 DIAGNOSIS — J45909 Unspecified asthma, uncomplicated: Secondary | ICD-10-CM | POA: Insufficient documentation

## 2022-01-19 DIAGNOSIS — Z79899 Other long term (current) drug therapy: Secondary | ICD-10-CM | POA: Diagnosis not present

## 2022-01-19 DIAGNOSIS — I10 Essential (primary) hypertension: Secondary | ICD-10-CM | POA: Diagnosis not present

## 2022-01-19 DIAGNOSIS — I6529 Occlusion and stenosis of unspecified carotid artery: Secondary | ICD-10-CM | POA: Insufficient documentation

## 2022-01-19 DIAGNOSIS — Z1231 Encounter for screening mammogram for malignant neoplasm of breast: Secondary | ICD-10-CM

## 2022-01-19 DIAGNOSIS — R0602 Shortness of breath: Secondary | ICD-10-CM | POA: Diagnosis not present

## 2022-01-19 MED ORDER — APIXABAN 2.5 MG PO TABS: 2.5000 mg | ORAL_TABLET | Freq: Two times a day (BID) | ORAL | 6 refills | Status: DC

## 2022-01-19 NOTE — Progress Notes (Signed)
Primary Care Physician: No primary care provider on file. Primary Cardiologist: Dr Oval Linsey Primary Electrophysiologist: none Referring Physician: Dr Ida Rogue Kendra [name] is a 86 y.o. female with a history of CAD, HTN, carotid stenosis, renal artery stenosis, HLD, asthma, atrial fibrillation who presents for consultation in the [facility].  She presented to the emergency department on 11/06/2021 for evaluation of dizziness and chest discomfort that started prior to arrival.  Her chest x-ray showed no acute cardiopulmonary disease.  Her head CT showed no acute intercranial abnormalities.  She was found to have an NSTEMI with her initial high-sensitivity troponins at 216 which trended up to 558.  Her EKG showed sinus bradycardia with no acute ST changes.  She was initiated on IV heparin.  She underwent LHC 11/07/21 which showed moderate in-stent restenosis with RFR of 0.88.  She received PCI with scoring balloon angioplasty. The patient was initially diagnosed with atrial fibrillation at cardiac rehab 01/15/22. Heart rates were 100-120 bpm, patient was asymptomatic. She was started on Eliquis for a CHADS2VASC score of 5 and her metoprolol was increased for rate control.  Today, patient remains in afib. She states her heart rates at home are 80s-90s bpm, very rarely over 100. She did have COVID in July. She has some SOB with exertion but is unsure if this is from afib or post COVID.   Today, she denies symptoms of palpitations, chest pain, orthopnea, PND, lower extremity edema, dizziness, presyncope, syncope, snoring, daytime somnolence, bleeding, or neurologic sequela. The patient is tolerating medications without difficulties and is otherwise without complaint today.    Atrial Fibrillation Risk Factors:  she does not have symptoms or diagnosis of sleep apnea. she does not have a history of rheumatic fever.   she has a BMI of Body mass index is 19.74  kg/m.[name] Filed Weights   01/19/22 1349  Weight: 53.8 kg    Family History  Problem Relation Age of Onset   Heart attack Mother        x3; age 14   Cancer Mother        colon   Heart failure Father    Diabetes Father    Hyperlipidemia Sister    Cancer Maternal Grandmother        breast   Breast cancer Maternal Grandmother    Heart attack Paternal Grandfather      Atrial Fibrillation Management history:  Previous antiarrhythmic drugs: none Previous cardioversions: none Previous ablations: none CHADS2VASC score: 5 Anticoagulation history: Eliquis   Past Medical History:  Diagnosis Date   Asthma    CAD (coronary artery disease)    last cath 08/2006   Carotid artery disease (HCC)    moderate left ICA stenosis by 2.6 ultrasound   DJD (degenerative joint disease), lumbar    Hearing loss    HTN (hypertension)    Hyperlipidemia    Renal artery stenosis ([place]) 05/05/2021   Vision loss of right eye    Past Surgical History:  Procedure Laterality Date   ABDOMINAL HYSTERECTOMY  1975   APPENDECTOMY     CORONARY ANGIOPLASTY WITH STENT PLACEMENT  08/2006   taxus stent x2 to LAD, 2.5x63m and 2.5x864m done at [facility] 11/07/2021   Procedure: INTRAVASCULAR PRESSURE WIRE/FFR STUDY;  Surgeon: ThEarly OsmondMD;  [facility];  Service: Cardiovascular;  Laterality: N/A;   LEFT HEART CATH AND CORONARY ANGIOGRAPHY N/A 11/07/2021   Procedure:  LEFT HEART CATH AND CORONARY ANGIOGRAPHY;  Surgeon: Early Osmond, MD;  Location: [facility];  Service: Cardiovascular;  Laterality: N/A;   RENAL ANGIOGRAM  [facility] in [place]    Current Outpatient Medications  Medication Sig Dispense Refill   albuterol (VENTOLIN HFA) 108 (90 Base) MCG/ACT inhaler Inhale 1 puff into the lungs 2 (two) times daily. ProAir     apixaban (ELIQUIS) 5 MG TABS tablet Take 1 tablet (5 mg total) by  mouth 2 (two) times daily. 60 tablet 1   Ascorbic Acid (VITAMIN C) 1000 MG tablet Take 1,000 mg by mouth daily.     beclomethasone (QVAR) 80 MCG/ACT inhaler Inhale 1 puff into the lungs 2 (two) times daily.     Biotin 5 MG TABS Take 5 mg by mouth every other day.     BLACK ELDERBERRY PO Take 10 mLs by mouth daily. Liquid     calcium carbonate (OS-CAL - DOSED IN MG OF ELEMENTAL CALCIUM) 1250 (500 Ca) MG tablet Take 500 mg by mouth daily.     Cholecalciferol (VITAMIN D) 50 MCG (2000 UT) CAPS Take 2,000 Units by mouth daily.     clobetasol (OLUX) 0.05 % topical foam Apply 1 application. topically daily as needed (sensitive scalp).     cloNIDine (CATAPRES) 0.1 MG tablet TAKE 1 TABLET THREE TIMES A DAY AND 2 TABLETS AT BEDTIME (Patient taking differently: Take 0.1 mg by mouth every 6 (six) hours. In a 24 hour period) 150 tablet 5   clopidogrel (PLAVIX) 75 MG tablet Take 1 tablet (75 mg total) by mouth daily. 30 tablet 6   Coenzyme Q10 (CO Q-10) 200 MG CAPS Take 200 mg by mouth daily.     Homeopathic Products (ARNICA EX) Apply 1 application. topically daily as needed (arthritis). Sore areas     latanoprost (XALATAN) 0.005 % ophthalmic solution Place 1 drop into both eyes at bedtime.     lisinopril (ZESTRIL) 20 MG tablet Take 1 tablet (20 mg total) by mouth 2 (two) times daily. 60 tablet 11   Menthol-Camphor (TIGER BALM NECK & SHOULDER RUB) 10-11 % CREA Apply 1 application. topically daily as needed (arthritis pain).     metoprolol succinate (TOPROL-XL) 50 MG 24 hr tablet Take 1 tablet (50 mg total) by mouth daily. Take with or immediately following a meal. 90 tablet 1   Misc Natural Products (ELDERBERRY ZINC/VIT C/IMMUNE MT) Take 50 mg by mouth daily. 90 mg vitamin C 30 mcg vitamin D 7.5 mg Zinc,  15 mg sodium 1 gummy by mouth daily     Multiple Vitamin (MULTIVITAMIN) capsule Take 1 capsule by mouth daily. Centrum  Silver adult 50+     nitroGLYCERIN (NITROSTAT) 0.4 MG SL tablet Place 1 tablet (0.4 mg  total) under the tongue every 5 (five) minutes x 3 doses as needed for chest pain. 25 tablet 2   polyethylene glycol (MIRALAX / GLYCOLAX) 17 g packet Take 17 g by mouth daily.     rosuvastatin (CRESTOR) 10 MG tablet Take 10 mg by mouth every other day.     triamcinolone (KENALOG) 0.1 % Apply 1 application. topically 2 (two) times daily as needed (rash, itching).     No current facility-administered medications for this encounter.    Allergies  Allergen Reactions   Chlorthalidone Other (See Comments)    Hyponatremia   Other     Other reaction(s): Respiratory Distress   Amlodipine     "  shivers"    Ciprofloxacin Other (See Comments)    UNK reaction   Dog Epithelium Allergy Skin Test Other (See Comments)    Cat and dog dander   Irbesartan Other (See Comments)   Latex Other (See Comments)    Irritates her skin   Mixed Feathers    Nebivolol Hcl     Other reaction(s): cant remember   Statins     Unable to tolerate statins w the exception of crestor.   Sulfa Antibiotics Other (See Comments)    "didnt feel good"   Sulfamethoxazole Other (See Comments)    Makes her feel bad   Zetia [Ezetimibe] Other (See Comments)    "makes me constipated"   Penicillins Rash    Social History   Socioeconomic History   Marital status: Married    Spouse name: Not on file   Number of children: 1   Years of education: MAx2   Highest education level: Not on file  Occupational History   Occupation: Retired   Occupation: Education officer, museum  Tobacco Use   Smoking status: Former    Types: Cigarettes    Quit date: 06/08/1966    Years since quitting: 55.6    Passive exposure: Past   Smokeless tobacco: Never  Vaping Use   Vaping Use: Never used  Substance and Sexual Activity   Alcohol use: Not Currently    Alcohol/week: 1.0 standard drink of alcohol    Types: 1 Standard drinks or equivalent per week    Comment: wine   Drug use: No   Sexual activity: Yes  Other Topics Concern   Not on file  Social  History Narrative   Not on file   Social Determinants of Health   Financial Resource Strain: Not on file  Food Insecurity: Not on file  Transportation Needs: Not on file  Physical Activity: Sufficiently Active (04/03/2021)   Exercise Vital Sign    Days of Exercise per Week: 5 days    Minutes of Exercise per Session: 30 min  Stress: Not on file  Social Connections: Not on file  Intimate Partner Violence: Not on file     ROS- All systems are reviewed and negative except as per the HPI above.  Physical Exam: Vitals:   01/19/22 1349  BP: 136/86  Pulse: (!) 109  Weight: 53.8 kg  Height: '5\' 5"'$  (1.651 m)    GEN- The patient is a well appearing elderly female, alert and oriented x 3 today.   Head- normocephalic, atraumatic Eyes-  Sclera clear, conjunctiva pink Ears- hearing intact Oropharynx- clear Neck- supple  Lungs- Clear to ausculation bilaterally, normal work of breathing Heart- irregular rate and rhythm, no murmurs, rubs or gallops  GI- soft, NT, ND, + BS Extremities- no clubbing, cyanosis, or edema MS- no significant deformity or atrophy Skin- no rash or lesion Psych- euthymic mood, full affect Neuro- strength and sensation are intact  Wt Readings from Last 3 Encounters:  01/19/22 53.8 kg  11/21/21 53.8 kg  11/17/21 53.8 kg    EKG today demonstrates  Afib Vent. rate 109 BPM PR interval * ms QRS duration 88 ms QT/QTcB 338/455 ms  Echo 11/07/21 demonstrated   1. Left ventricular ejection fraction, by estimation, is 60 to 65%. The  left ventricle has normal function. The left ventricle has no regional  wall motion abnormalities. Left ventricular diastolic function could not  be evaluated.   2. Right ventricular systolic function is normal. The right ventricular  size is normal. Tricuspid regurgitation  signal is inadequate for assessing PA pressure.   3. Left atrial size was mildly dilated.   4. The mitral valve is normal in structure. No evidence of mitral  valve regurgitation. Moderate mitral annular calcification.   5. The aortic valve is normal in structure. There is mild calcification  of the aortic valve. Aortic valve regurgitation is not visualized.   6. Aortic no significant ascending aortic aneurysm.   7. The inferior vena cava is dilated in size with >50% respiratory  variability, suggesting right atrial pressure of 8 mmHg.   Comparison(s): No significant change from prior study.   Epic records are reviewed at length today  CHA2DS2-VASc Score = 5  The patient's score is based upon: CHF History: 0 HTN History: 1 Diabetes History: 0 Stroke History: 0 Vascular Disease History: 1 Age Score: 2 Gender Score: 1       ASSESSMENT AND PLAN: 1. New Onset Atrial Fibrillation (ICD10:  I48.19) The patient's CHA2DS2-VASc score is 5, indicating a 7.2% annual risk of stroke.   Patient remains in afib today. HR 80-90s at home.  General education about afib provided and questions answered. We also discussed her stroke risk and the risks and benefits of anticoagulation. ? If afib related to recent COVID infection.  Will continue Toprol 50 mg daily Will decrease Eliquis to 2.5 mg BID (age >45, weight < 60kg). She has only been taking Eliquis once daily. Stressed importance of taking BID. Will arrange for DCCV after 3 weeks of anticoagulation.   2. Secondary Hypercoagulable State (ICD10:  D68.69) The patient is at significant risk for stroke/thromboembolism based upon her CHA2DS2-VASc Score of 5.  Continue Apixaban (Eliquis).   3. CAD S/p balloon angioplasty 11/07/21 No anginal symptoms.  4. HTN Stable, no changes today.   Follow up in the AF clinic 1-2 weeks post DCCV.    [facility] [address] [place], [place] [address] [phone] 01/19/2022 2:09 PM

## 2022-01-19 NOTE — Patient Instructions (Signed)
Cardioversion scheduled for September 5th 2023  - Arrive at the [facility] and go to admitting at 10:30am  - Do not eat or drink anything after midnight the night prior to your procedure.  - Take all your morning medication (except diabetic medications) with a sip of water prior to arrival.  - You will not be able to drive home after your procedure.  - Do NOT miss any doses of your blood thinner - if you should miss a dose please notify our office immediately.  - If you feel as if you go back into normal rhythm prior to scheduled cardioversion, please notify our office immediately. If your procedure is canceled in the cardioversion suite you will be charged a cancellation fee.

## 2022-01-20 ENCOUNTER — Other Ambulatory Visit (HOSPITAL_COMMUNITY): Payer: Self-pay | Admitting: *Deleted

## 2022-01-21 ENCOUNTER — Ambulatory Visit (HOSPITAL_COMMUNITY): Payer: Medicare Other

## 2022-01-23 ENCOUNTER — Ambulatory Visit (HOSPITAL_COMMUNITY): Payer: Medicare Other

## 2022-01-26 ENCOUNTER — Ambulatory Visit (HOSPITAL_COMMUNITY): Payer: Medicare Other

## 2022-01-28 ENCOUNTER — Ambulatory Visit (HOSPITAL_COMMUNITY): Payer: Medicare Other

## 2022-01-29 DIAGNOSIS — M1711 Unilateral primary osteoarthritis, right knee: Secondary | ICD-10-CM | POA: Diagnosis not present

## 2022-01-29 DIAGNOSIS — M170 Bilateral primary osteoarthritis of knee: Secondary | ICD-10-CM | POA: Diagnosis not present

## 2022-01-29 DIAGNOSIS — M25461 Effusion, right knee: Secondary | ICD-10-CM | POA: Diagnosis not present

## 2022-01-29 DIAGNOSIS — M25561 Pain in right knee: Secondary | ICD-10-CM | POA: Diagnosis not present

## 2022-01-30 ENCOUNTER — Ambulatory Visit (HOSPITAL_COMMUNITY): Payer: Medicare Other

## 2022-02-02 ENCOUNTER — Ambulatory Visit (HOSPITAL_COMMUNITY): Payer: Medicare Other

## 2022-02-02 DIAGNOSIS — J4540 Moderate persistent asthma, uncomplicated: Secondary | ICD-10-CM | POA: Diagnosis not present

## 2022-02-02 DIAGNOSIS — I25118 Atherosclerotic heart disease of native coronary artery with other forms of angina pectoris: Secondary | ICD-10-CM | POA: Diagnosis not present

## 2022-02-02 DIAGNOSIS — I10 Essential (primary) hypertension: Secondary | ICD-10-CM | POA: Diagnosis not present

## 2022-02-02 DIAGNOSIS — E782 Mixed hyperlipidemia: Secondary | ICD-10-CM | POA: Diagnosis not present

## 2022-02-04 ENCOUNTER — Ambulatory Visit (HOSPITAL_COMMUNITY): Payer: Medicare Other

## 2022-02-06 ENCOUNTER — Ambulatory Visit (HOSPITAL_COMMUNITY): Payer: Medicare Other

## 2022-02-10 ENCOUNTER — Other Ambulatory Visit (HOSPITAL_COMMUNITY): Payer: Medicare Other | Admitting: Physician Assistant

## 2022-02-10 ENCOUNTER — Ambulatory Visit (HOSPITAL_COMMUNITY): Admission: RE | Admit: 2022-02-10 | Payer: Medicare Other | Source: Home / Self Care | Admitting: Internal Medicine

## 2022-02-10 ENCOUNTER — Encounter (HOSPITAL_COMMUNITY): Admission: RE | Payer: Self-pay | Source: Home / Self Care

## 2022-02-10 SURGERY — CARDIOVERSION
Anesthesia: Monitor Anesthesia Care

## 2022-02-11 ENCOUNTER — Ambulatory Visit (HOSPITAL_COMMUNITY)
Admission: RE | Admit: 2022-02-11 | Discharge: 2022-02-11 | Disposition: A | Discharge status: Home / Self Care | Payer: Medicare Other | Source: Ambulatory Visit | Attending: Physician Assistant | Admitting: Physician Assistant

## 2022-02-11 ENCOUNTER — Ambulatory Visit (HOSPITAL_COMMUNITY): Payer: Medicare Other

## 2022-02-11 ENCOUNTER — Encounter (HOSPITAL_COMMUNITY): Payer: Self-pay | Admitting: Physician Assistant

## 2022-02-11 VITALS — BP 136/90 | HR 82 | Ht 65.0 in | Wt 116.2 lb

## 2022-02-11 DIAGNOSIS — I10 Essential (primary) hypertension: Secondary | ICD-10-CM | POA: Diagnosis not present

## 2022-02-11 DIAGNOSIS — D6869 Other thrombophilia: Secondary | ICD-10-CM | POA: Diagnosis not present

## 2022-02-11 DIAGNOSIS — Z7901 Long term (current) use of anticoagulants: Secondary | ICD-10-CM | POA: Diagnosis not present

## 2022-02-11 DIAGNOSIS — I4819 Other persistent atrial fibrillation: Secondary | ICD-10-CM | POA: Diagnosis not present

## 2022-02-11 DIAGNOSIS — E785 Hyperlipidemia, unspecified: Secondary | ICD-10-CM | POA: Insufficient documentation

## 2022-02-11 DIAGNOSIS — J45909 Unspecified asthma, uncomplicated: Secondary | ICD-10-CM | POA: Diagnosis not present

## 2022-02-11 DIAGNOSIS — I2510 Atherosclerotic heart disease of native coronary artery without angina pectoris: Secondary | ICD-10-CM | POA: Diagnosis not present

## 2022-02-11 DIAGNOSIS — I6529 Occlusion and stenosis of unspecified carotid artery: Secondary | ICD-10-CM | POA: Diagnosis not present

## 2022-02-11 DIAGNOSIS — I701 Atherosclerosis of renal artery: Secondary | ICD-10-CM | POA: Insufficient documentation

## 2022-02-11 MED ORDER — METOPROLOL SUCCINATE ER 50 MG PO TB24: 75.0000 mg | ORAL_TABLET | Freq: Every day | ORAL | 1 refills | Status: DC

## 2022-02-11 NOTE — Progress Notes (Signed)
Primary Care Physician: Patient, No Pcp Per Primary Cardiologist: Dr Oval Linsey Primary Electrophysiologist: none Referring Physician: Dr Ida Rogue Kendra [name] is a 86 y.o. female with a history of CAD, HTN, carotid stenosis, renal artery stenosis, HLD, asthma, atrial fibrillation who presents for follow up in the [facility].  She presented to the emergency department on 11/06/2021 for evaluation of dizziness and chest discomfort that started prior to arrival.  Her chest x-ray showed no acute cardiopulmonary disease.  Her head CT showed no acute intercranial abnormalities.  She was found to have an NSTEMI with her initial high-sensitivity troponins at 216 which trended up to 558.  Her EKG showed sinus bradycardia with no acute ST changes.  She was initiated on IV heparin.  She underwent LHC 11/07/21 which showed moderate in-stent restenosis with RFR of 0.88.  She received PCI with scoring balloon angioplasty. The patient was initially diagnosed with atrial fibrillation at cardiac rehab 01/15/22. Heart rates were 100-120 bpm, patient was asymptomatic. She was started on Eliquis for a CHADS2VASC score of 5 and her metoprolol was increased for rate control.  On follow up today, patient cancelled her DCCV in order to discuss afib options again. She brings in her BP and heart rate readings which show overall good control with an occasional heart rate >100 bpm. She is unaware of her arrhythmia today.   Today, she denies symptoms of palpitations, chest pain, orthopnea, PND, lower extremity edema, dizziness, presyncope, syncope, snoring, daytime somnolence, bleeding, or neurologic sequela. The patient is tolerating medications without difficulties and is otherwise without complaint today.    Atrial Fibrillation Risk Factors:  she does not have symptoms or diagnosis of sleep apnea. she does not have a history of rheumatic fever.   she has a BMI of Body mass index is  19.34 kg/m.[name] Filed Weights   02/11/22 1525  Weight: 52.7 kg    Family History  Problem Relation Age of Onset   Heart attack Mother        x3; age 84   Cancer Mother        colon   Heart failure Father    Diabetes Father    Hyperlipidemia Sister    Cancer Maternal Grandmother        breast   Breast cancer Maternal Grandmother    Heart attack Paternal Grandfather      Atrial Fibrillation Management history:  Previous antiarrhythmic drugs: none Previous cardioversions: none Previous ablations: none CHADS2VASC score: 5 Anticoagulation history: Eliquis   Past Medical History:  Diagnosis Date   Asthma    CAD (coronary artery disease)    last cath 08/2006   Carotid artery disease (HCC)    moderate left ICA stenosis by 2.6 ultrasound   DJD (degenerative joint disease), lumbar    Hearing loss    HTN (hypertension)    Hyperlipidemia    Renal artery stenosis ([place]) 05/05/2021   Vision loss of right eye    Past Surgical History:  Procedure Laterality Date   ABDOMINAL HYSTERECTOMY  1975   APPENDECTOMY     CORONARY ANGIOPLASTY WITH STENT PLACEMENT  08/2006   taxus stent x2 to LAD, 2.5x52m and 2.5x853m done at [facility] 11/07/2021   Procedure: INTRAVASCULAR PRESSURE WIRE/FFR STUDY;  Surgeon: ThEarly OsmondMD;  Location[facility];  Service: Cardiovascular;  Laterality: N/A;   LEFT HEART CATH AND CORONARY ANGIOGRAPHY N/A 11/07/2021   Procedure:  LEFT HEART CATH AND CORONARY ANGIOGRAPHY;  Surgeon: Early Osmond, MD;  Location: [facility];  Service: Cardiovascular;  Laterality: N/A;   RENAL ANGIOGRAM  [facility] in [place]    Current Outpatient Medications  Medication Sig Dispense Refill   albuterol (VENTOLIN HFA) 108 (90 Base) MCG/ACT inhaler Inhale 1 puff into the lungs 2 (two) times daily. ProAir     apixaban (ELIQUIS) 2.5 MG TABS tablet Take 1 tablet (2.5 mg  total) by mouth 2 (two) times daily. 60 tablet 6   Ascorbic Acid (VITAMIN C) 1000 MG tablet Take 1,000 mg by mouth daily.     beclomethasone (QVAR) 80 MCG/ACT inhaler Inhale 1 puff into the lungs 2 (two) times daily.     Biotin 5 MG TABS Take 5 mg by mouth every other day.     BLACK ELDERBERRY PO Take 10 mLs by mouth daily. Liquid     Calcium-Magnesium-Vitamin D (CALCIUM+D3 GRADUAL RELEASE PO) Take by mouth.     Cholecalciferol (VITAMIN D) 50 MCG (2000 UT) CAPS Take 2,000 Units by mouth daily.     clobetasol (OLUX) 0.05 % topical foam Apply 1 application. topically daily as needed (sensitive scalp).     cloNIDine (CATAPRES) 0.1 MG tablet TAKE 1 TABLET THREE TIMES A DAY AND 2 TABLETS AT BEDTIME (Patient taking differently: Take 0.1 mg by mouth every 6 (six) hours. In a 24 hour period) 150 tablet 5   clopidogrel (PLAVIX) 75 MG tablet Take 1 tablet (75 mg total) by mouth daily. 30 tablet 6   Coenzyme Q10 (CO Q-10) 200 MG CAPS Take 200 mg by mouth daily.     Homeopathic Products (ARNICA EX) Apply 1 application. topically daily as needed (arthritis). Sore areas     latanoprost (XALATAN) 0.005 % ophthalmic solution Place 1 drop into both eyes at bedtime.     lisinopril (ZESTRIL) 20 MG tablet Take 1 tablet (20 mg total) by mouth 2 (two) times daily. 60 tablet 11   Menthol-Camphor (TIGER BALM NECK & SHOULDER RUB) 10-11 % CREA Apply 1 application. topically daily as needed (arthritis pain).     Misc Natural Products (ELDERBERRY ZINC/VIT C/IMMUNE MT) Take 50 mg by mouth daily. 90 mg vitamin C 30 mcg vitamin D 7.5 mg Zinc,  15 mg sodium 1 gummy by mouth daily     Multiple Vitamin (MULTIVITAMIN) capsule Take 1 capsule by mouth daily. Centrum  Silver adult 50+     nitroGLYCERIN (NITROSTAT) 0.4 MG SL tablet Place 1 tablet (0.4 mg total) under the tongue every 5 (five) minutes x 3 doses as needed for chest pain. 25 tablet 2   polyethylene glycol (MIRALAX / GLYCOLAX) 17 g packet Take 17 g by mouth daily.      rosuvastatin (CRESTOR) 10 MG tablet Take 10 mg by mouth every other day.     triamcinolone (KENALOG) 0.1 % Apply 1 application. topically 2 (two) times daily as needed (rash, itching).     metoprolol succinate (TOPROL-XL) 50 MG 24 hr tablet Take 1.5 tablets (75 mg total) by mouth daily. Take with or immediately following a meal. 90 tablet 1   No current facility-administered medications for this encounter.    Allergies  Allergen Reactions   Chlorthalidone Other (See Comments)    Hyponatremia   Other     Other reaction(s): Respiratory Distress   Amlodipine     "shivers"    Ciprofloxacin Other (See Comments)    UNK  reaction   Dog Epithelium Allergy Skin Test Other (See Comments)    Cat and dog dander   Irbesartan Other (See Comments)   Latex Other (See Comments)    Irritates her skin   Mixed Feathers    Nebivolol Hcl     Other reaction(s): cant remember   Statins     Unable to tolerate statins w the exception of crestor.   Sulfa Antibiotics Other (See Comments)    "didnt feel good"   Sulfamethoxazole Other (See Comments)    Makes her feel bad   Zetia [Ezetimibe] Other (See Comments)    "makes me constipated"   Penicillins Rash    Social History   Socioeconomic History   Marital status: Married    Spouse name: Not on file   Number of children: 1   Years of education: MAx2   Highest education level: Not on file  Occupational History   Occupation: Retired   Occupation: Education officer, museum  Tobacco Use   Smoking status: Former    Types: Cigarettes    Quit date: 06/08/1966    Years since quitting: 55.7    Passive exposure: Past   Smokeless tobacco: Never   Tobacco comments:    Former smoker 02/11/22  Vaping Use   Vaping Use: Never used  Substance and Sexual Activity   Alcohol use: Not Currently    Alcohol/week: 1.0 standard drink of alcohol    Types: 1 Standard drinks or equivalent per week    Comment: wine   Drug use: No   Sexual activity: Yes  Other Topics Concern    Not on file  Social History Narrative   Not on file   Social Determinants of Health   Financial Resource Strain: Not on file  Food Insecurity: Not on file  Transportation Needs: Not on file  Physical Activity: Sufficiently Active (04/03/2021)   Exercise Vital Sign    Days of Exercise per Week: 5 days    Minutes of Exercise per Session: 30 min  Stress: Not on file  Social Connections: Not on file  Intimate Partner Violence: Not on file     ROS- All systems are reviewed and negative except as per the HPI above.  Physical Exam: Vitals:   02/11/22 1525  BP: (!) 136/90  Pulse: 82  Weight: 52.7 kg  Height: '5\' 5"'$  (1.651 m)     GEN- The patient is a well appearing elderly female, alert and oriented x 3 today.   HEENT-head normocephalic, atraumatic, sclera clear, conjunctiva pink, hearing intact, trachea midline. Lungs- Clear to ausculation bilaterally, normal work of breathing Heart- irregular rate and rhythm, no murmurs, rubs or gallops  GI- soft, NT, ND, + BS Extremities- no clubbing, cyanosis, or edema MS- no significant deformity or atrophy Skin- no rash or lesion Psych- euthymic mood, full affect Neuro- strength and sensation are intact   Wt Readings from Last 3 Encounters:  02/11/22 52.7 kg  01/19/22 53.8 kg  11/21/21 53.8 kg    EKG today demonstrates  Afib Vent. rate 82 BPM PR interval * ms QRS duration 94 ms QT/QTcB 364/425 ms  Echo 11/07/21 demonstrated   1. Left ventricular ejection fraction, by estimation, is 60 to 65%. The  left ventricle has normal function. The left ventricle has no regional  wall motion abnormalities. Left ventricular diastolic function could not  be evaluated.   2. Right ventricular systolic function is normal. The right ventricular  size is normal. Tricuspid regurgitation signal is inadequate for assessing PA  pressure.   3. Left atrial size was mildly dilated.   4. The mitral valve is normal in structure. No evidence of mitral  valve regurgitation. Moderate mitral annular calcification.   5. The aortic valve is normal in structure. There is mild calcification  of the aortic valve. Aortic valve regurgitation is not visualized.   6. Aortic no significant ascending aortic aneurysm.   7. The inferior vena cava is dilated in size with >50% respiratory  variability, suggesting right atrial pressure of 8 mmHg.   Comparison(s): No significant change from prior study.   Epic records are reviewed at length today  CHA2DS2-VASc Score = 5  The patient's score is based upon: CHF History: 0 HTN History: 1 Diabetes History: 0 Stroke History: 0 Vascular Disease History: 1 Age Score: 2 Gender Score: 1       ASSESSMENT AND PLAN: 1. Persistent atrial fibrillation  The patient's CHA2DS2-VASc score is 5, indicating a 7.2% annual risk of stroke.   Patient remains in rate controlled afib.  We discussed rate vs rhythm control today. Would avoid class IC with h/o CAD. Could consider dofetilide or amiodarone. For now, patient would like to up titrate her BB. Ultimately, she may opt for a rate control strategy.   Increase Toprol to 50 mg AM and 25 mg PM. Patient to monitor BP and HR closely with clonidine use.  Continue Eliquis 2.5 mg BID (age >80, weight < 60kg)  2. Secondary Hypercoagulable State (ICD10:  D68.69) The patient is at significant risk for stroke/thromboembolism based upon her CHA2DS2-VASc Score of 5.  Continue Apixaban (Eliquis).   3. CAD S/p balloon angioplasty 11/07/21 No anginal symptoms.  4. HTN Stable, med changes as above.   Follow up in the AF clinic 02/24/22 as scheduled and then with Dr Oval Linsey.    Kendra [name] [address] [place], [place] [address] [phone] 02/11/2022 4:23 PM

## 2022-02-13 ENCOUNTER — Ambulatory Visit (HOSPITAL_COMMUNITY): Payer: Medicare Other

## 2022-02-16 ENCOUNTER — Ambulatory Visit (HOSPITAL_COMMUNITY): Payer: Medicare Other

## 2022-02-18 ENCOUNTER — Ambulatory Visit (HOSPITAL_COMMUNITY): Payer: Medicare Other

## 2022-02-20 ENCOUNTER — Ambulatory Visit (HOSPITAL_COMMUNITY): Payer: Medicare Other

## 2022-02-23 ENCOUNTER — Ambulatory Visit (INDEPENDENT_AMBULATORY_CARE_PROVIDER_SITE_OTHER): Payer: Medicare Other | Admitting: Podiatry

## 2022-02-23 ENCOUNTER — Ambulatory Visit (HOSPITAL_COMMUNITY): Payer: Medicare Other

## 2022-02-23 ENCOUNTER — Encounter: Payer: Self-pay | Admitting: Podiatry

## 2022-02-23 DIAGNOSIS — D689 Coagulation defect, unspecified: Secondary | ICD-10-CM

## 2022-02-23 DIAGNOSIS — M79675 Pain in left toe(s): Secondary | ICD-10-CM | POA: Diagnosis not present

## 2022-02-23 DIAGNOSIS — M79674 Pain in right toe(s): Secondary | ICD-10-CM

## 2022-02-23 DIAGNOSIS — I872 Venous insufficiency (chronic) (peripheral): Secondary | ICD-10-CM | POA: Diagnosis not present

## 2022-02-23 DIAGNOSIS — B351 Tinea unguium: Secondary | ICD-10-CM | POA: Diagnosis not present

## 2022-02-23 NOTE — Progress Notes (Signed)
  Subjective:  Patient ID: Kendra [name], female    DOB: July 03, 1935,   MRN: [id number]  Chief Complaint  Patient presents with   Foot Problem    Small toe on left foot is split down the middle. All toes need trimming as most are crooked and hard for me to trim.    86 y.o. female presents for concern of toenail fungus. Requesting to have nails trimmed today relates she was recently started on a blood thinner after procedure done in January. . Denies any other pedal complaints. Denies n/v/f/c.   Past Medical History:  Diagnosis Date   Asthma    CAD (coronary artery disease)    last cath 08/2006   Carotid artery disease (HCC)    moderate left ICA stenosis by 2.6 ultrasound   DJD (degenerative joint disease), lumbar    Hearing loss    HTN (hypertension)    Hyperlipidemia    Renal artery stenosis ([place]) 05/05/2021   Vision loss of right eye     Objective:  Physical Exam: Vascular: DP/PT pulses 2/4 bilateral. CFT <3 seconds. Normal hair growth on digits. No edema.  Skin. No lacerations or abrasions bilateral feet. Nails 1-5 are thickened discolored and elongated with subungual debris on left. Right appear normal thickness color and length. Musculoskeletal: MMT 5/5 bilateral lower extremities in DF, PF, Inversion and Eversion. Deceased ROM in DF of ankle joint.  Neurological: Sensation intact to light touch.   Assessment:   1. Pain due to onychomycosis of toenails of both feet   2. Peripheral venous insufficiency   3. Coagulation defect [facility])         Plan:  Patient was evaluated and treated and all questions answered. -Examined patient -Discussed treatment options for painful dystrophic nails  --Discussed and educated patient on foot care, especially with  regards to the vascular, neurological and musculoskeletal systems.  -Discussed supportive shoes at all times and checking feet regularly.  -Mechanically debrided all nails 1-5 bilateral using sterile nail nipper and  filed with dremel without incident  -Answered all patient questions -Patient to return  in 3 months for at risk foot care -Patient advised to call the office if any problems or questions arise in the meantime.    Lorenda Peck, DPM

## 2022-02-24 ENCOUNTER — Ambulatory Visit (HOSPITAL_COMMUNITY)
Admission: RE | Admit: 2022-02-24 | Discharge: 2022-02-24 | Disposition: A | Discharge status: Home / Self Care | Payer: Medicare Other | Source: Ambulatory Visit | Attending: Physician Assistant | Admitting: Physician Assistant

## 2022-02-24 ENCOUNTER — Encounter (HOSPITAL_COMMUNITY): Payer: Self-pay | Admitting: Physician Assistant

## 2022-02-24 VITALS — BP 136/80 | HR 76 | Ht 65.0 in | Wt 115.4 lb

## 2022-02-24 DIAGNOSIS — I4819 Other persistent atrial fibrillation: Secondary | ICD-10-CM | POA: Diagnosis not present

## 2022-02-24 DIAGNOSIS — I10 Essential (primary) hypertension: Secondary | ICD-10-CM | POA: Insufficient documentation

## 2022-02-24 DIAGNOSIS — Z7901 Long term (current) use of anticoagulants: Secondary | ICD-10-CM | POA: Diagnosis not present

## 2022-02-24 DIAGNOSIS — E785 Hyperlipidemia, unspecified: Secondary | ICD-10-CM | POA: Diagnosis not present

## 2022-02-24 DIAGNOSIS — Z9862 Peripheral vascular angioplasty status: Secondary | ICD-10-CM | POA: Diagnosis not present

## 2022-02-24 DIAGNOSIS — D6869 Other thrombophilia: Secondary | ICD-10-CM | POA: Diagnosis not present

## 2022-02-24 DIAGNOSIS — J45909 Unspecified asthma, uncomplicated: Secondary | ICD-10-CM | POA: Insufficient documentation

## 2022-02-24 DIAGNOSIS — I701 Atherosclerosis of renal artery: Secondary | ICD-10-CM | POA: Diagnosis not present

## 2022-02-24 DIAGNOSIS — I2510 Atherosclerotic heart disease of native coronary artery without angina pectoris: Secondary | ICD-10-CM | POA: Diagnosis not present

## 2022-02-24 DIAGNOSIS — I6529 Occlusion and stenosis of unspecified carotid artery: Secondary | ICD-10-CM | POA: Diagnosis not present

## 2022-02-24 NOTE — Progress Notes (Signed)
Primary Care Physician: Donald Prose, MD Primary Cardiologist: Dr Oval Linsey Primary Electrophysiologist: none Referring Physician: Dr Kendra [name] is a 86 y.o. female with a history of CAD, HTN, carotid stenosis, renal artery stenosis, HLD, asthma, atrial fibrillation who presents for follow up in the [facility].  She presented to the emergency department on 11/06/2021 for evaluation of dizziness and chest discomfort that started prior to arrival.  Her chest x-ray showed no acute cardiopulmonary disease.  Her head CT showed no acute intercranial abnormalities.  She was found to have an NSTEMI with her initial high-sensitivity troponins at 216 which trended up to 558.  Her EKG showed sinus bradycardia with no acute ST changes.  She was initiated on IV heparin.  She underwent LHC 11/07/21 which showed moderate in-stent restenosis with RFR of 0.88.  She received PCI with scoring balloon angioplasty. The patient was initially diagnosed with atrial fibrillation at cardiac rehab 01/15/22. Heart rates were 100-120 bpm, patient was asymptomatic. She was started on Eliquis for a CHADS2VASC score of 5 and her metoprolol was increased for rate control.  Patient cancelled her DCCV in order to discuss afib options again. Her BB was increased at that time.  On follow up today, patient reports that she has done well since her last visit. She has been very active with family and her church. She does not feel the afib has limited her activity. No bleeding issues on anticoagulation.   Today, she denies symptoms of palpitations, chest pain, orthopnea, PND, lower extremity edema, dizziness, presyncope, syncope, snoring, daytime somnolence, bleeding, or neurologic sequela. The patient is tolerating medications without difficulties and is otherwise without complaint today.    Atrial Fibrillation Risk Factors:  she does not have symptoms or diagnosis of sleep apnea. she does  not have a history of rheumatic fever.   she has a BMI of Body mass index is 19.2 kg/m.[name] Filed Weights   02/24/22 1415  Weight: 52.3 kg    Family History  Problem Relation Age of Onset   Heart attack Mother        x3; age 35   Cancer Mother        colon   Heart failure Father    Diabetes Father    Hyperlipidemia Sister    Cancer Maternal Grandmother        breast   Breast cancer Maternal Grandmother    Heart attack Paternal Grandfather      Atrial Fibrillation Management history:  Previous antiarrhythmic drugs: none Previous cardioversions: none Previous ablations: none CHADS2VASC score: 5 Anticoagulation history: Eliquis   Past Medical History:  Diagnosis Date   Asthma    CAD (coronary artery disease)    last cath 08/2006   Carotid artery disease (HCC)    moderate left ICA stenosis by 2.6 ultrasound   DJD (degenerative joint disease), lumbar    Hearing loss    HTN (hypertension)    Hyperlipidemia    Renal artery stenosis ([place]) 05/05/2021   Vision loss of right eye    Past Surgical History:  Procedure Laterality Date   ABDOMINAL HYSTERECTOMY  1975   APPENDECTOMY     CORONARY ANGIOPLASTY WITH STENT PLACEMENT  08/2006   taxus stent x2 to LAD, 2.5x63m and 2.5x84m done at [facility] 11/07/2021   Procedure: INTRAVASCULAR PRESSURE WIRE/FFR STUDY;  Surgeon: ThEarly OsmondMD;  [facility];  Service: Cardiovascular;  Laterality: N/A;   LEFT HEART CATH AND CORONARY ANGIOGRAPHY N/A 11/07/2021   Procedure: LEFT HEART CATH AND CORONARY ANGIOGRAPHY;  Surgeon: Early Osmond, MD;  Location: [facility];  Service: Cardiovascular;  Laterality: N/A;   RENAL ANGIOGRAM  [facility] in [place]    Current Outpatient Medications  Medication Sig Dispense Refill   albuterol (VENTOLIN HFA) 108 (90 Base) MCG/ACT inhaler Inhale 1 puff into the lungs 2 (two) times  daily. ProAir     apixaban (ELIQUIS) 2.5 MG TABS tablet Take 1 tablet (2.5 mg total) by mouth 2 (two) times daily. 60 tablet 6   Ascorbic Acid (VITAMIN C) 1000 MG tablet Take 1,000 mg by mouth daily.     beclomethasone (QVAR) 80 MCG/ACT inhaler Inhale 1 puff into the lungs 2 (two) times daily.     Biotin 5 MG TABS Take 5 mg by mouth every other day.     BLACK ELDERBERRY PO Take 10 mLs by mouth daily. Liquid     Calcium-Magnesium-Vitamin D (CALCIUM+D3 GRADUAL RELEASE PO) Take by mouth.     Cholecalciferol (VITAMIN D) 50 MCG (2000 UT) CAPS Take 2,000 Units by mouth daily.     clobetasol (OLUX) 0.05 % topical foam Apply 1 application. topically daily as needed (sensitive scalp).     cloNIDine (CATAPRES) 0.1 MG tablet TAKE 1 TABLET THREE TIMES A DAY AND 2 TABLETS AT BEDTIME (Patient taking differently: Take 0.1 mg by mouth every 6 (six) hours. In a 24 hour period) 150 tablet 5   clopidogrel (PLAVIX) 75 MG tablet Take 1 tablet (75 mg total) by mouth daily. 30 tablet 6   Coenzyme Q10 (CO Q-10) 200 MG CAPS Take 200 mg by mouth daily.     Homeopathic Products (ARNICA EX) Apply 1 application. topically daily as needed (arthritis). Sore areas     latanoprost (XALATAN) 0.005 % ophthalmic solution Place 1 drop into both eyes at bedtime.     lisinopril (ZESTRIL) 20 MG tablet Take 1 tablet (20 mg total) by mouth 2 (two) times daily. (Patient taking differently: Take 20 mg by mouth daily.) 60 tablet 11   Menthol-Camphor (TIGER BALM NECK & SHOULDER RUB) 10-11 % CREA Apply 1 application. topically daily as needed (arthritis pain).     metoprolol succinate (TOPROL-XL) 50 MG 24 hr tablet Take 1.5 tablets (75 mg total) by mouth daily. Take with or immediately following a meal. 90 tablet 1   Misc Natural Products (ELDERBERRY ZINC/VIT C/IMMUNE MT) Take 50 mg by mouth daily. 90 mg vitamin C 30 mcg vitamin D 7.5 mg Zinc,  15 mg sodium 1 gummy by mouth daily     Multiple Vitamin (MULTIVITAMIN) capsule Take 1 capsule by  mouth daily. Centrum  Silver adult 50+     nitroGLYCERIN (NITROSTAT) 0.4 MG SL tablet Place 1 tablet (0.4 mg total) under the tongue every 5 (five) minutes x 3 doses as needed for chest pain. 25 tablet 2   polyethylene glycol (MIRALAX / GLYCOLAX) 17 g packet Take 17 g by mouth daily.     rosuvastatin (CRESTOR) 10 MG tablet Take 10 mg by mouth every other day.     triamcinolone (KENALOG) 0.1 % Apply 1 application. topically 2 (two) times daily as needed (rash, itching).     No current facility-administered medications for this encounter.    Allergies  Allergen Reactions   Chlorthalidone Other (See Comments)    Hyponatremia   Other  Other reaction(s): Respiratory Distress   Amlodipine     "shivers"    Ciprofloxacin Other (See Comments)    UNK reaction   Dog Epithelium Allergy Skin Test Other (See Comments)    Cat and dog dander   Irbesartan Other (See Comments)   Latex Other (See Comments)    Irritates her skin   Mixed Feathers    Nebivolol Hcl     Other reaction(s): cant remember   Statins     Unable to tolerate statins w the exception of crestor.   Sulfa Antibiotics Other (See Comments)    "didnt feel good"   Sulfamethoxazole Other (See Comments)    Makes her feel bad   Zetia [Ezetimibe] Other (See Comments)    "makes me constipated"   Penicillins Rash    Social History   Socioeconomic History   Marital status: Married    Spouse name: Not on file   Number of children: 1   Years of education: MAx2   Highest education level: Not on file  Occupational History   Occupation: Retired   Occupation: Education officer, museum  Tobacco Use   Smoking status: Former    Types: Cigarettes    Quit date: 06/08/1966    Years since quitting: 55.7    Passive exposure: Past   Smokeless tobacco: Never   Tobacco comments:    Former smoker 02/11/22  Vaping Use   Vaping Use: Never used  Substance and Sexual Activity   Alcohol use: Not Currently    Alcohol/week: 1.0 standard drink of  alcohol    Types: 1 Standard drinks or equivalent per week    Comment: wine   Drug use: No   Sexual activity: Yes  Other Topics Concern   Not on file  Social History Narrative   Not on file   Social Determinants of Health   Financial Resource Strain: Not on file  Food Insecurity: Not on file  Transportation Needs: Not on file  Physical Activity: Sufficiently Active (04/03/2021)   Exercise Vital Sign    Days of Exercise per Week: 5 days    Minutes of Exercise per Session: 30 min  Stress: Not on file  Social Connections: Not on file  Intimate Partner Violence: Not on file     ROS- All systems are reviewed and negative except as per the HPI above.  Physical Exam: Vitals:   02/24/22 1415  BP: 136/80  Pulse: 76  Weight: 52.3 kg  Height: '5\' 5"'$  (1.651 m)     GEN- The patient is a well appearing elderly female, alert and oriented x 3 today.   HEENT-head normocephalic, atraumatic, sclera clear, conjunctiva pink, hearing intact, trachea midline. Lungs- Clear to ausculation bilaterally, normal work of breathing Heart- irregular rate and rhythm, no murmurs, rubs or gallops  GI- soft, NT, ND, + BS Extremities- no clubbing, cyanosis, or edema MS- no significant deformity or atrophy Skin- no rash or lesion Psych- euthymic mood, full affect Neuro- strength and sensation are intact   Wt Readings from Last 3 Encounters:  02/24/22 52.3 kg  02/11/22 52.7 kg  01/19/22 53.8 kg    EKG today demonstrates  Afib, NST Vent. rate 76 BPM PR interval * ms QRS duration 88 ms QT/QTcB 374/420 ms   Echo 11/07/21 demonstrated   1. Left ventricular ejection fraction, by estimation, is 60 to 65%. The  left ventricle has normal function. The left ventricle has no regional  wall motion abnormalities. Left ventricular diastolic function could not  be evaluated.  2. Right ventricular systolic function is normal. The right ventricular  size is normal. Tricuspid regurgitation signal is  inadequate for assessing PA pressure.   3. Left atrial size was mildly dilated.   4. The mitral valve is normal in structure. No evidence of mitral valve regurgitation. Moderate mitral annular calcification.   5. The aortic valve is normal in structure. There is mild calcification  of the aortic valve. Aortic valve regurgitation is not visualized.   6. Aortic no significant ascending aortic aneurysm.   7. The inferior vena cava is dilated in size with >50% respiratory  variability, suggesting right atrial pressure of 8 mmHg.   Comparison(s): No significant change from prior study.   Epic records are reviewed at length today  CHA2DS2-VASc Score = 5  The patient's score is based upon: CHF History: 0 HTN History: 1 Diabetes History: 0 Stroke History: 0 Vascular Disease History: 1 Age Score: 2 Gender Score: 1       ASSESSMENT AND PLAN: 1. Persistent atrial fibrillation  The patient's CHA2DS2-VASc score is 5, indicating a 7.2% annual risk of stroke.   Patient in rate controlled afib.  We discussed rate vs rhythm control again today. She would like to continue with rate control for now. Could consider a trial of SR with DCCV to see if her mild SOB improves. If AAD is needed, would avoid class IC with h/o CAD. Could consider dofetilide or amiodarone.  Continue Toprol to 50 mg AM and 25 mg PM Continue Eliquis 2.5 mg BID (age >80, weight < 60kg)  2. Secondary Hypercoagulable State (ICD10:  D68.69) The patient is at significant risk for stroke/thromboembolism based upon her CHA2DS2-VASc Score of 5.  Continue Apixaban (Eliquis).   3. CAD S/p balloon angioplasty 11/07/21 No anginal symptoms.  4. HTN Stable, no changes today.   Follow up with Dr Oval Linsey as scheduled. AF clinic as needed.    [facility] [address] [place], [place] [address] [phone] 02/24/2022 3:02 PM

## 2022-02-25 ENCOUNTER — Ambulatory Visit (HOSPITAL_COMMUNITY): Payer: Medicare Other

## 2022-02-27 ENCOUNTER — Ambulatory Visit (HOSPITAL_COMMUNITY): Payer: Medicare Other

## 2022-02-27 ENCOUNTER — Ambulatory Visit: Payer: Medicare Other

## 2022-02-27 DIAGNOSIS — Z1231 Encounter for screening mammogram for malignant neoplasm of breast: Secondary | ICD-10-CM | POA: Diagnosis not present

## 2022-03-02 ENCOUNTER — Ambulatory Visit (HOSPITAL_COMMUNITY): Payer: Medicare Other

## 2022-03-04 ENCOUNTER — Ambulatory Visit: Payer: Medicare Other | Admitting: Podiatry

## 2022-03-06 ENCOUNTER — Ambulatory Visit (HOSPITAL_COMMUNITY): Payer: Medicare Other

## 2022-03-09 ENCOUNTER — Ambulatory Visit (HOSPITAL_COMMUNITY): Payer: Medicare Other

## 2022-03-10 DIAGNOSIS — I700 Atherosclerosis of aorta: Secondary | ICD-10-CM | POA: Diagnosis not present

## 2022-03-10 DIAGNOSIS — Z23 Encounter for immunization: Secondary | ICD-10-CM | POA: Diagnosis not present

## 2022-03-10 DIAGNOSIS — Z955 Presence of coronary angioplasty implant and graft: Secondary | ICD-10-CM | POA: Diagnosis not present

## 2022-03-10 DIAGNOSIS — R7303 Prediabetes: Secondary | ICD-10-CM | POA: Diagnosis not present

## 2022-03-10 DIAGNOSIS — E782 Mixed hyperlipidemia: Secondary | ICD-10-CM | POA: Diagnosis not present

## 2022-03-10 DIAGNOSIS — J4530 Mild persistent asthma, uncomplicated: Secondary | ICD-10-CM | POA: Diagnosis not present

## 2022-03-10 DIAGNOSIS — I701 Atherosclerosis of renal artery: Secondary | ICD-10-CM | POA: Diagnosis not present

## 2022-03-10 DIAGNOSIS — I10 Essential (primary) hypertension: Secondary | ICD-10-CM | POA: Diagnosis not present

## 2022-03-10 DIAGNOSIS — I2510 Atherosclerotic heart disease of native coronary artery without angina pectoris: Secondary | ICD-10-CM | POA: Diagnosis not present

## 2022-03-10 DIAGNOSIS — I4891 Unspecified atrial fibrillation: Secondary | ICD-10-CM | POA: Diagnosis not present

## 2022-03-10 DIAGNOSIS — D6869 Other thrombophilia: Secondary | ICD-10-CM | POA: Diagnosis not present

## 2022-03-11 ENCOUNTER — Ambulatory Visit (HOSPITAL_COMMUNITY): Payer: Medicare Other

## 2022-03-13 ENCOUNTER — Ambulatory Visit (HOSPITAL_COMMUNITY): Payer: Medicare Other

## 2022-03-16 ENCOUNTER — Ambulatory Visit (HOSPITAL_COMMUNITY): Payer: Medicare Other

## 2022-03-18 ENCOUNTER — Ambulatory Visit (HOSPITAL_COMMUNITY): Payer: Medicare Other

## 2022-03-19 ENCOUNTER — Ambulatory Visit: Payer: Medicare Other

## 2022-03-20 ENCOUNTER — Ambulatory Visit (HOSPITAL_COMMUNITY): Payer: Medicare Other

## 2022-03-25 DIAGNOSIS — B370 Candidal stomatitis: Secondary | ICD-10-CM | POA: Diagnosis not present

## 2022-03-25 DIAGNOSIS — R0982 Postnasal drip: Secondary | ICD-10-CM | POA: Diagnosis not present

## 2022-03-25 DIAGNOSIS — Z03818 Encounter for observation for suspected exposure to other biological agents ruled out: Secondary | ICD-10-CM | POA: Diagnosis not present

## 2022-03-25 DIAGNOSIS — J029 Acute pharyngitis, unspecified: Secondary | ICD-10-CM | POA: Diagnosis not present

## 2022-03-30 ENCOUNTER — Encounter (HOSPITAL_BASED_OUTPATIENT_CLINIC_OR_DEPARTMENT_OTHER): Payer: Self-pay | Admitting: Cardiovascular Disease

## 2022-03-30 ENCOUNTER — Ambulatory Visit (INDEPENDENT_AMBULATORY_CARE_PROVIDER_SITE_OTHER): Payer: Medicare Other | Admitting: Cardiovascular Disease

## 2022-03-30 DIAGNOSIS — I701 Atherosclerosis of renal artery: Secondary | ICD-10-CM

## 2022-03-30 DIAGNOSIS — I4819 Other persistent atrial fibrillation: Secondary | ICD-10-CM

## 2022-03-30 DIAGNOSIS — I10 Essential (primary) hypertension: Secondary | ICD-10-CM | POA: Diagnosis not present

## 2022-03-30 MED ORDER — METOPROLOL SUCCINATE ER 25 MG PO TB24: ORAL_TABLET | ORAL | 3 refills | Status: DC

## 2022-03-30 MED ORDER — DILTIAZEM HCL ER 180 MG PO TB24: 180.0000 mg | ORAL_TABLET | Freq: Every day | ORAL | 3 refills | Status: DC

## 2022-03-30 NOTE — Assessment & Plan Note (Addendum)
She remains in atrial fibrillation.  Rate is well-controlled.  She seems to tolerate the '25mg'$  tablets better than '50mg'$  tablets.  Her heart rate is averaging in the 80s.  She takes metoprolol '50mg'$  in the AM and then '75mg'$  in the evening.  Continue Eliquis.

## 2022-03-30 NOTE — Progress Notes (Signed)
Cardiology Office Note:    Date:  03/30/2022   ID:  Kendra [name], DOB 02/06/1936, MRN [id number]  PCP:  Donald Prose, MD   [facility] Cardiologist:  Skeet Latch, MD 37    Referring MD: Vernie Shanks, MD   No chief complaint on file.   History of Present Illness:    Kendra [name] is a 86 y.o. female with a hx of asthma, CAD s/p LAD PCI, carotid stenosis, renal artery stenosis, DJD, hypertension, and hyperlipidemia, here for follow up.  She was seen 03/2021 for discussion of medication management. She last saw Dr. Einar Gip 02/2021 for her blood pressure. She has intolerances to multiple medications. She had renal artery dopplers 10/2020 which revealed greater than 50% stenosis in the bilaterally. The left renal artery was not fully visualized.  She had a CTA of the abdomen and pelvis 04/2021 which revealed 50% proximal left renal artery stenosis and 2 right renal arteries.  There was 40% proximal stenosis in the larger artery.  She had some mild plaque in the SMA and some stenosis in the IMA.  She also had 50% bilateral common iliac artery stenosis.  At that appointment, hydralazine was reduced to 25 mg. She previously did not tolerate this medicine due to headaches. She last saw Dr. Gwenlyn Found 06/2020 and her blood pressure was 138/70. At that time she was on clonidine and losartan. When she was first seen in advanced hypertension clinic her blood pressure was 214/76.  She continued to report intolerance to multiple medications.  Losartan was switched to irbesartan 150 mg twice daily.  Hydralazine was discontinued and she was started on spironolactone.  She was instructed to start taking the clonidine every 8 hours.    At the last visit her BP was mostly in the 409W-119J systolic but occasionally spiked to the 200s. She was started on Lisinopril and agreed to take Clonidine as prescribed. She was admitted in 11/2021 with NSTEMI. Left heart cath showed moderate in stent  restenosis of prior LAD stent. It was treated with balloon angioplasty with good result. She followed up with Coletta Memos, NP and had bruising at her cath site but was otherwise well. She split her Lisinopril into twice daily dosing and her evening dose of Clonidine was increased. She had to change Brilinta to Plavix to cost. She was diagnosed with COVID 12/2021. She had an episode of A-fib at cardiac rehab and remained in A-fib when she followed up in A-fib clinic 4 days later. She was started on Eliquis and remained on Metoprolol. They recommended cardioversion but she cancelled it.  Today, she states that she has been feeling okay overall. She states that she would feel abdominal pain 1-2 hours after taking her Metoprolol '50mg'$  that would last for several hours. However, she switched to taking 2 pills of Metoprolol '25mg'$  and has not had any issues with the pain since. She continued to notice elevated blood pressures despite taking the Metoprolol, so she started taking an additional '25mg'$  dose at bedtime with improvement. Patient provided her home blood pressure log which shows systolic pressures in the 100s-160s but averaging in the 130s. She states that her heart rates have been in the 80s for the most part. She reports waking up in the middle of the night a few weeks ago with some mild tongue swelling. She was concerned that this was due to her use of Lisinopril, but she has not had any further episodes of oral swelling despite continued use  of the Lisinopril. She also complains of intermittent hoarseness and rhinorrhea. She is staying active and walking 1-1.5 miles a day on a moderate incline with her husband. She reports having trouble with hills due to knee arthritis but denies any chest pain or shortness of breath with exertion. She notes that one of her right arm veins has been much more visible and mildly itchy since a few weeks after angioplasty. She denies any pain associated with this.   Prior  Antihypertensives: Chlorthalidone Amlodipine - chills Bystolic Nifedipine Valsartan Olmesartan Spironolactone Lisinopril- rhinorrhea, hoarseness  Past Medical History:  Diagnosis Date   Asthma    CAD (coronary artery disease)    last cath 08/2006   Carotid artery disease (HCC)    moderate left ICA stenosis by 2.6 ultrasound   DJD (degenerative joint disease), lumbar    Hearing loss    Hyperlipidemia    Renal artery stenosis ([place]) 05/05/2021   Vision loss of right eye     Past Surgical History:  Procedure Laterality Date   ABDOMINAL HYSTERECTOMY  1975   APPENDECTOMY     CORONARY ANGIOPLASTY WITH STENT PLACEMENT  08/2006   taxus stent x2 to LAD, 2.5x98m and 2.5x861m [facility] 11/07/2021   Procedure: INTRAVASCULAR PRESSURE WIRE/FFR STUDY;  Surgeon: ThEarly OsmondMD;  [facility];  Service: Cardiovascular;  Laterality: N/A;   LEFT HEART CATH AND CORONARY ANGIOGRAPHY N/A 11/07/2021   Procedure: LEFT HEART CATH AND CORONARY ANGIOGRAPHY;  Surgeon: ThEarly OsmondMD;  [facility];  Service: Cardiovascular;  Laterality: N/A;   RENAL ANGIOGRAM  [facility]  Current Medications: Current Meds  Medication Sig   albuterol (VENTOLIN HFA) 108 (90 Base) MCG/ACT inhaler Inhale 1 puff into the lungs 2 (two) times daily. ProAir   apixaban (ELIQUIS) 2.5 MG TABS tablet Take 1 tablet (2.5 mg total) by mouth 2 (two) times daily.   Ascorbic Acid (VITAMIN C) 1000 MG tablet Take 1,000 mg by mouth daily.   beclomethasone (QVAR) 80 MCG/ACT inhaler Inhale 1 puff into the lungs 2 (two) times daily.   Biotin 5 MG TABS Take 5 mg by mouth every other day.   BLACK ELDERBERRY PO Take 10 mLs by mouth daily. Liquid   Calcium-Magnesium-Vitamin D (CALCIUM+D3 GRADUAL RELEASE PO) Take by mouth.   Cholecalciferol (VITAMIN D) 50 MCG (2000 UT) CAPS Take 2,000 Units by  mouth daily.   clobetasol (OLUX) 0.05 % topical foam Apply 1 application. topically daily as needed (sensitive scalp).   cloNIDine (CATAPRES) 0.1 MG tablet Take 0.1 mg by mouth 3 (three) times daily. Take a forth tablets if systolic goes over 17086 clopidogrel (PLAVIX) 75 MG tablet Take 1 tablet (75 mg total) by mouth daily.   Coenzyme Q10 (CO Q-10) 200 MG CAPS Take 200 mg by mouth daily.   diltiazem (CARDIZEM LA) 180 MG 24 hr tablet Take 1 tablet (180 mg total) by mouth daily.   Homeopathic Products (ARNICA EX) Apply 1 application. topically daily as needed (arthritis). Sore areas   latanoprost (XALATAN) 0.005 % ophthalmic solution Place 1 drop into both eyes at bedtime.   Menthol-Camphor (TIGER BALM NECK & SHOULDER RUB) 10-11 % CREA Apply 1 application. topically daily as needed (arthritis pain).   Misc Natural Products (ELDERBERRY ZINC/VIT C/IMMUNE MT) Take 50 mg by mouth daily. 90 mg vitamin C 30  mcg vitamin D 7.5 mg Zinc,  15 mg sodium 1 gummy by mouth daily   Multiple Vitamin (MULTIVITAMIN) capsule Take 1 capsule by mouth daily. Centrum  Silver adult 50+   nitroGLYCERIN (NITROSTAT) 0.4 MG SL tablet Place 1 tablet (0.4 mg total) under the tongue every 5 (five) minutes x 3 doses as needed for chest pain.   polyethylene glycol (MIRALAX / GLYCOLAX) 17 g packet Take 17 g by mouth daily.   rosuvastatin (CRESTOR) 10 MG tablet Take 10 mg by mouth every other day.   triamcinolone (KENALOG) 0.1 % Apply 1 application. topically 2 (two) times daily as needed (rash, itching).   [DISCONTINUED] lisinopril (ZESTRIL) 20 MG tablet Take 20 mg by mouth daily.   [DISCONTINUED] metoprolol succinate (TOPROL-XL) 25 MG 24 hr tablet Take 2 in the morning and 1 tablet in the evening   [DISCONTINUED] metoprolol succinate (TOPROL-XL) 50 MG 24 hr tablet Take 1.5 tablets (75 mg total) by mouth daily. Take with or immediately following a meal.     Allergies:   Chlorthalidone, Other, Amlodipine, Ciprofloxacin, Dog  epithelium allergy skin test, Irbesartan, Latex, Mixed feathers, Nebivolol hcl, Statins, Sulfa antibiotics, Sulfamethoxazole, Zetia [ezetimibe], and Penicillins   Social History   Socioeconomic History   Marital status: Married    Spouse name: Not on file   Number of children: 1   Years of education: MAx2   Highest education level: Not on file  Occupational History   Occupation: Retired   Occupation: Education officer, museum  Tobacco Use   Smoking status: Former    Types: Cigarettes    Quit date: 06/08/1966    Years since quitting: 55.8    Passive exposure: Past   Smokeless tobacco: Never   Tobacco comments:    Former smoker 02/11/22  Vaping Use   Vaping Use: Never used  Substance and Sexual Activity   Alcohol use: Not Currently    Alcohol/week: 1.0 standard drink of alcohol    Types: 1 Standard drinks or equivalent per week    Comment: wine   Drug use: No   Sexual activity: Yes  Other Topics Concern   Not on file  Social History Narrative   Not on file   Social Determinants of Health   Financial Resource Strain: Not on file  Food Insecurity: Not on file  Transportation Needs: Not on file  Physical Activity: Sufficiently Active (04/03/2021)   Exercise Vital Sign    Days of Exercise per Week: 5 days    Minutes of Exercise per Session: 30 min  Stress: Not on file  Social Connections: Not on file     Family History: The patient's family history includes Breast cancer in her maternal grandmother; Cancer in her maternal grandmother and mother; Diabetes in her father; Heart attack in her mother and paternal grandfather; Heart failure in her father; Hyperlipidemia in her sister.  ROS:   Please see the history of present illness.    (+) rhinorrhea All other systems reviewed and are negative.  EKGs/Labs/Other Studies Reviewed:    The following studies were reviewed today:  CT-A Abd/pelvis 04/08/21: IMPRESSION: 1. No evidence of high-grade renal artery stenosis by CTA.  Single left renal artery demonstrates approximately 50% proximal stenosis. There are 2 separate right renal arteries emanating from the abdominal aorta. The larger of these to arteries demonstrates approximately 40% proximal stenosis. The smaller right renal artery demonstrates no stenosis. 2. Mild plaque at the origins of the celiac axis and SMA without evidence of significant stenosis. Some  degree of stenosis is felt to be present at the origin of the IMA. 3. Calcified plaque at the origins of bilateral common iliac arteries causing roughly 50% bilateral stenoses.   PCV Renal Artery Duplex 10/17/2020: Hemodynamically significant stenosis bilaterally. Left renal artery not  well visualized. >50% stenosis bilaterally. Normal intrarenal vascular  perfusion is noted in the right kidney. Renal length is within normal limits for right kidney. Abdominal aorta shows atherosclerotic changes and mild ectasia, consider dedicated aortic duplex if clinically indicated.   Carotid Duplex 07/05/2020: Summary:  Right Carotid: Velocities in the right ICA are consistent with a 1-39%  stenosis.   Left Carotid: Velocities in the left ICA are consistent with a 40-59%  stenosis.   Vertebrals:  Bilateral vertebral arteries demonstrate antegrade flow.  Subclavians: Normal flow hemodynamics were seen in bilateral subclavian arteries.   Echo 04/10/2020:  1. Left ventricular ejection fraction, by estimation, is 60 to 65%. The  left ventricle has normal function. The left ventricle has no regional  wall motion abnormalities. Left ventricular diastolic parameters are  consistent with Grade I diastolic  dysfunction (impaired relaxation). Elevated left ventricular end-diastolic  pressure.   2. Right ventricular systolic function is normal. The right ventricular  size is normal. There is normal pulmonary artery systolic pressure.   3. Left atrial size was mildly dilated.   4. The mitral valve is normal in  structure. Trivial mitral valve  regurgitation. No evidence of mitral stenosis. Moderate mitral annular  calcification.   5. The aortic valve is tricuspid. Aortic valve regurgitation is not  visualized. No aortic stenosis is present.   6. The inferior vena cava is normal in size with greater than 50%  respiratory variability, suggesting right atrial pressure of 3 mmHg.   EKG:   EKG is ordered today. EKG is personally reviewed and demonstrates A-fib rate of 93bpm. 10/14/2021: EKG was not ordered.   04/03/21: No EKG  Recent Labs: 01/15/2022: ALT 25; BUN 14; Creatinine, Ser 0.70; Hemoglobin 14.3; Magnesium 2.1; Platelets 284; Potassium 4.9; Sodium 138; TSH 1.970   Recent Lipid Panel    Component Value Date/Time   CHOL 132 11/07/2021 0158   TRIG 27 11/07/2021 0158   HDL 64 11/07/2021 0158   CHOLHDL 2.1 11/07/2021 0158   VLDL 5 11/07/2021 0158   LDLCALC 63 11/07/2021 0158     Physical Exam:    Wt Readings from Last 3 Encounters:  03/30/22 115 lb 6.4 oz (52.3 kg)  02/24/22 115 lb 6.4 oz (52.3 kg)  02/11/22 116 lb 3.2 oz (52.7 kg)     VS:  BP (!) 158/62 (BP Location: Right Arm)   Pulse 93   Ht '5\' 5"'$  (1.651 m)   Wt 115 lb 6.4 oz (52.3 kg)   BMI 19.20 kg/m  , BMI Body mass index is 19.2 kg/m. GENERAL:  Well appearing HEENT: Pupils equal round and reactive, fundi not visualized, oral mucosa unremarkable NECK:  No jugular venous distention, waveform within normal limits, carotid upstroke brisk and symmetric, no bruits, no thyromegaly LUNGS:  Clear to auscultation bilaterally HEART:  RRR.  PMI not displaced or sustained,S1 and S2 within normal limits, no S3, no S4, no clicks, no rubs, no murmurs ABD:  Flat, positive bowel sounds normal in frequency in pitch, no bruits, no rebound, no guarding, no midline pulsatile mass, no hepatomegaly, no splenomegaly EXT:  2 plus pulses throughout, no cyanosis no clubbing; Neuroma of right ankle, Trace BLE edema.  SKIN:  No rashes no  nodules NEURO:  Cranial nerves II through XII grossly intact, motor grossly intact throughout PSYCH:  Cognitively intact, oriented to person place and time   ASSESSMENT:    1. Persistent atrial fibrillation ([place])   2. Essential hypertension   3. Renal artery stenosis (HCC)    PLAN:    Persistent atrial fibrillation ([place]) She remains in atrial fibrillation.  Rate is well-controlled.  She seems to tolerate the '25mg'$  tablets better than '50mg'$  tablets.  Her heart rate is averaging in the 80s.  She takes metoprolol '50mg'$  in the AM and then '75mg'$  in the evening.  Continue Eliquis.    Essential hypertension BP remains uncontrolled.  At home her BP is ranging 100s-150s.  On average it is 120-130s.  She decided to reduce her lisinopril from twice daily to daily because her tongue was swollen one night.  She also stopped taking her evening dose of clonidine.  She continues to self-adjust her BP medications as she sees fit.  Continue metoprolol and stop lisinopril.  Start diltiazem at '180mg'$  daily.    Renal artery stenosis (HCC) Mild renal stenosis bilaterally.  Continue BP control as above.      Disposition: F/u with APP in 1-2 months.  Medication Adjustments/Labs and Tests Ordered: Current medicines are reviewed at length with the patient today.  Concerns regarding medicines are outlined above.   Orders Placed This Encounter  Procedures   EKG 12-Lead   Meds ordered this encounter  Medications   metoprolol succinate (TOPROL-XL) 25 MG 24 hr tablet    Sig: Take 2 in the morning and 1 tablet in the evening    Dispense:  270 tablet    Refill:  3   diltiazem (CARDIZEM LA) 180 MG 24 hr tablet    Sig: Take 1 tablet (180 mg total) by mouth daily.    Dispense:  90 tablet    Refill:  3    D/C LISNIOPRIL   Patient Instructions  Medication Instructions:  START DILTIAZEM 180 MG DAILY   STOP LISINOPRIL   *If you need a refill on your cardiac medications before your next appointment, please  call your pharmacy*  Lab Work: NONE  Testing/Procedures: NONE  Follow-Up: At [facility], you and your health needs are our priority.  As part of our continuing mission to provide you with exceptional heart care, we have created designated Provider Care Teams.  These Care Teams include your primary Cardiologist (physician) and Advanced Practice Providers (APPs -  Physician Assistants and Nurse Practitioners) who all work together to provide you with the care you need, when you need it.  We recommend signing up for the patient portal called "MyChart".  Sign up information is provided on this After Visit Summary.  MyChart is used to connect with patients for Virtual Visits (Telemedicine).  Patients are able to view lab/test results, encounter notes, upcoming appointments, etc.  Non-urgent messages can be sent to your provider as well.   To learn more about what you can do with MyChart, go to [email or web address].    Your next appointment:   1 month(s)  The format for your next appointment:   In Person  Provider:   Laurann Montana, NP         I,Alexis Herring,acting as a scribe for Skeet Latch, MD.,have documented all relevant documentation on the behalf of Skeet Latch, MD,as directed by  Skeet Latch, MD while in the presence of Skeet Latch, MD.  I, Krum Oval Linsey, MD have reviewed all documentation  for this visit.  The documentation of the exam, diagnosis, procedures, and orders on 03/30/2022 are all accurate and complete.  Time spent: 40 minutes-Greater than 50% of this time was spent in counseling, explanation of diagnosis, planning of further management, and coordination of care.    Signed, Skeet Latch, MD  03/30/2022 1:00 PM    [place]

## 2022-03-30 NOTE — Assessment & Plan Note (Signed)
Mild renal stenosis bilaterally.  Continue BP control as above.

## 2022-03-30 NOTE — Assessment & Plan Note (Addendum)
BP remains uncontrolled.  At home her BP is ranging 100s-150s.  On average it is 120-130s.  She decided to reduce her lisinopril from twice daily to daily because her tongue was swollen one night.  She also stopped taking her evening dose of clonidine.  She continues to self-adjust her BP medications as she sees fit.  Continue metoprolol and stop lisinopril.  Start diltiazem at '180mg'$  daily.

## 2022-03-30 NOTE — Patient Instructions (Signed)
Medication Instructions:  START DILTIAZEM 180 MG DAILY   STOP LISINOPRIL   *If you need a refill on your cardiac medications before your next appointment, please call your pharmacy*  Lab Work: NONE  Testing/Procedures: NONE  Follow-Up: At [facility], you and your health needs are our priority.  As part of our continuing mission to provide you with exceptional heart care, we have created designated Provider Care Teams.  These Care Teams include your primary Cardiologist (physician) and Advanced Practice Providers (APPs -  Physician Assistants and Nurse Practitioners) who all work together to provide you with the care you need, when you need it.  We recommend signing up for the patient portal called "MyChart".  Sign up information is provided on this After Visit Summary.  MyChart is used to connect with patients for Virtual Visits (Telemedicine).  Patients are able to view lab/test results, encounter notes, upcoming appointments, etc.  Non-urgent messages can be sent to your provider as well.   To learn more about what you can do with MyChart, go to [email or web address].    Your next appointment:   1 month(s)  The format for your next appointment:   In Person  Provider:   Laurann Montana, NP

## 2022-04-01 DIAGNOSIS — M1711 Unilateral primary osteoarthritis, right knee: Secondary | ICD-10-CM | POA: Diagnosis not present

## 2022-04-06 DIAGNOSIS — I10 Essential (primary) hypertension: Secondary | ICD-10-CM | POA: Diagnosis not present

## 2022-04-06 DIAGNOSIS — Z8673 Personal history of transient ischemic attack (TIA), and cerebral infarction without residual deficits: Secondary | ICD-10-CM | POA: Diagnosis not present

## 2022-04-06 DIAGNOSIS — I25118 Atherosclerotic heart disease of native coronary artery with other forms of angina pectoris: Secondary | ICD-10-CM | POA: Diagnosis not present

## 2022-04-06 DIAGNOSIS — I4819 Other persistent atrial fibrillation: Secondary | ICD-10-CM | POA: Diagnosis not present

## 2022-04-06 DIAGNOSIS — D6869 Other thrombophilia: Secondary | ICD-10-CM | POA: Diagnosis not present

## 2022-04-06 DIAGNOSIS — Z0000 Encounter for general adult medical examination without abnormal findings: Secondary | ICD-10-CM | POA: Diagnosis not present

## 2022-04-06 DIAGNOSIS — E782 Mixed hyperlipidemia: Secondary | ICD-10-CM | POA: Diagnosis not present

## 2022-04-06 DIAGNOSIS — Z1331 Encounter for screening for depression: Secondary | ICD-10-CM | POA: Diagnosis not present

## 2022-04-06 DIAGNOSIS — M8588 Other specified disorders of bone density and structure, other site: Secondary | ICD-10-CM | POA: Diagnosis not present

## 2022-04-06 DIAGNOSIS — J4540 Moderate persistent asthma, uncomplicated: Secondary | ICD-10-CM | POA: Diagnosis not present

## 2022-04-06 DIAGNOSIS — H409 Unspecified glaucoma: Secondary | ICD-10-CM | POA: Diagnosis not present

## 2022-04-06 DIAGNOSIS — R7303 Prediabetes: Secondary | ICD-10-CM | POA: Diagnosis not present

## 2022-04-08 DIAGNOSIS — M1711 Unilateral primary osteoarthritis, right knee: Secondary | ICD-10-CM | POA: Diagnosis not present

## 2022-04-08 DIAGNOSIS — M25461 Effusion, right knee: Secondary | ICD-10-CM | POA: Diagnosis not present

## 2022-04-10 DIAGNOSIS — Z23 Encounter for immunization: Secondary | ICD-10-CM | POA: Diagnosis not present

## 2022-04-17 ENCOUNTER — Telehealth (HOSPITAL_BASED_OUTPATIENT_CLINIC_OR_DEPARTMENT_OTHER): Payer: Self-pay | Admitting: Cardiovascular Disease

## 2022-04-17 NOTE — Telephone Encounter (Signed)
Left message for patient to call and discuss scheduling the CTA abdomen and pelvis ordered by Dr. Oval Linsey

## 2022-04-17 NOTE — Telephone Encounter (Signed)
Patient returned staff call.

## 2022-04-17 NOTE — Telephone Encounter (Signed)
Patient returning scheduling call

## 2022-04-20 ENCOUNTER — Other Ambulatory Visit (HOSPITAL_BASED_OUTPATIENT_CLINIC_OR_DEPARTMENT_OTHER): Payer: Self-pay | Admitting: *Deleted

## 2022-04-20 DIAGNOSIS — I10 Essential (primary) hypertension: Secondary | ICD-10-CM

## 2022-04-20 DIAGNOSIS — I701 Atherosclerosis of renal artery: Secondary | ICD-10-CM

## 2022-04-20 NOTE — Telephone Encounter (Signed)
Returned patient's call from late Friday afternoon---lm for her to call back

## 2022-04-20 NOTE — Telephone Encounter (Signed)
Spoke with patient regarding the 06/05/22 3:00 pm CTA abdomen/pelvis appointment scheduled at [place].  [place], ground floor.  Arrival time is 2:45 pm for check in ---patient to have blood work drawn the week of 05/25/22.  Will mail information to patient and she voiced her understanding.

## 2022-04-24 DIAGNOSIS — M810 Age-related osteoporosis without current pathological fracture: Secondary | ICD-10-CM | POA: Diagnosis not present

## 2022-04-24 DIAGNOSIS — Z780 Asymptomatic menopausal state: Secondary | ICD-10-CM | POA: Diagnosis not present

## 2022-04-27 ENCOUNTER — Encounter (HOSPITAL_BASED_OUTPATIENT_CLINIC_OR_DEPARTMENT_OTHER): Payer: Self-pay | Admitting: Family

## 2022-04-27 ENCOUNTER — Ambulatory Visit (INDEPENDENT_AMBULATORY_CARE_PROVIDER_SITE_OTHER): Payer: Medicare Other | Admitting: Family

## 2022-04-27 VITALS — BP 146/88 | HR 89 | Ht 65.0 in | Wt 121.7 lb

## 2022-04-27 DIAGNOSIS — I4819 Other persistent atrial fibrillation: Secondary | ICD-10-CM

## 2022-04-27 DIAGNOSIS — D6859 Other primary thrombophilia: Secondary | ICD-10-CM

## 2022-04-27 DIAGNOSIS — I10 Essential (primary) hypertension: Secondary | ICD-10-CM | POA: Diagnosis not present

## 2022-04-27 MED ORDER — METOPROLOL SUCCINATE ER 25 MG PO TB24: ORAL_TABLET | ORAL | 3 refills | Status: DC

## 2022-04-27 MED ORDER — FUROSEMIDE 20 MG PO TABS: 20.0000 mg | ORAL_TABLET | Freq: Every day | ORAL | 3 refills | Status: DC

## 2022-04-27 NOTE — Progress Notes (Unsigned)
Office Visit    Patient Name: Kendra [name] Date of Encounter: 04/27/2022  PCP:  Donald Prose, [place]  Cardiologist:  Skeet Latch, MD  Advanced Practice Provider:  No care team member to display Electrophysiologist:  None      Chief Complaint    Kendra [name] is a 86 y.o. female presents today for follow up of hypertension   Past Medical History    Past Medical History:  Diagnosis Date   Asthma    CAD (coronary artery disease)    last cath 08/2006   Carotid artery disease (HCC)    moderate left ICA stenosis by 2.6 ultrasound   DJD (degenerative joint disease), lumbar    Hearing loss    Hyperlipidemia    Renal artery stenosis ([place]) 05/05/2021   Vision loss of right eye    Past Surgical History:  Procedure Laterality Date   ABDOMINAL HYSTERECTOMY  1975   APPENDECTOMY     CORONARY ANGIOPLASTY WITH STENT PLACEMENT  08/2006   taxus stent x2 to LAD, 2.5x12m and 2.5x842m [place] [facility]A 11/07/2021   Procedure: INTRAVASCULAR PRESSURE W[facility] STUDY;  Surgeon: ThEarly OsmondMD;  [facility];  Service: Cardiovascular;  Laterality: N/A;   LEFT HEART CATH AND CORONARY ANGIOGRAPHY N/A 11/07/2021   Procedure: LEFT HEART CATH AND CORONARY ANGIOGRAPHY;  Surgeon: ThEarly OsmondMD;  [facility];  Service: Cardiovascular;  Laterality: N/A;   [facility] [place]  Allergies  Allergies  Allergen Reactions   Chlorthalidone Other (See Comments)    Hyponatremia   Other     Other reaction(s): Respiratory Distress   Amlodipine     "shivers"    Ciprofloxacin Other (See Comments)    UNK reaction   Dog Epithelium Allergy Skin Test Other (See Comments)    Cat and dog dander   Irbesartan Other (See Comments)   Latex Other (See Comments)    Irritates her skin   Mixed Feathers     Nebivolol Hcl     Other reaction(s): cant remember   Statins     Unable to tolerate statins w the exception of crestor.   Sulfa Antibiotics Other (See Comments)    "didnt feel good"   Sulfamethoxazole Other (See Comments)    Makes her feel bad   Zetia [Ezetimibe] Other (See Comments)    "makes me constipated"   Penicillins Rash    History of Present Illness    Kendra [name] a [age].o. female with a hx of atrial fibrillation, hypertension, asthma, CAD s/p LAD PCI, carotic stenosis, HLD, DJD, renal artery stenosis last seen 03/30/22 by Dr. RaOval Linsey Blood pressure management has been limited by medication intolerances. Renal artery dopplers 10/2020 >50% stenosis bilaterally. CTA abd/pelvis 04/2021 50% prox L renal artery stenosis and 2 right renal arteries. There was 40% prox stenosis in larger argery. Mild plaque in SMA and some stenosis in IMA. 50% bilateral common iliac artery stenosis.   Established with ADV HTN cliic with BP 214/76. Losartan switched to Irbesartan. Hydralazine stopped. Started on Spironolactone. Clonidine adjusted.   Admitted 11/2021 for NSTEMI with LHC showing moderate ISR of prior LAD stent treated with balloon angioplasty. Transitioned from Brilinta to Plavix due to cost. She had episode of atrial fibrillation and was seen by atrial fib  clinic recommended for DCCV but she cancelled the procedure.   Last seen 03/2022 feeling overall well. Heart rate at home in the 80s. BP not at goal. She declined cardioversion. Lisinopril was stopped as she reported not tolerating. She was started on Diltiazem '180mg'$  QD.   Pleasant lady who presents today for follow up. She is very involved in her syn[employer]. Tells me her blood pressure at home has been 120s-138. She forgot her log. She notes she stopped Diltiazem Saturday as she felt her legs were swelling and dry which she attributed to medication. She uses the [facility] website often to look up her medications per her report.  We discussed that contacting us with concerns about her medication sprior to making changes would be advised.   EKGs/Labs/Other Studies Reviewed:   The following studies were reviewed today:  EKG:  EKG is not ordered today.  Recent Labs: 01/15/2022: ALT 25; BUN 14; Creatinine, Ser 0.70; Hemoglobin 14.3; Magnesium 2.1; Platelets 284; Potassium 4.9; Sodium 138; TSH 1.970  Recent Lipid Panel    Component Value Date/Time   CHOL 132 11/07/2021 0158   TRIG 27 11/07/2021 0158   HDL 64 11/07/2021 0158   CHOLHDL 2.1 11/07/2021 0158   VLDL 5 11/07/2021 0158   LDLCALC 63 11/07/2021 0158    Risk Assessment/Calculations:   CHA2DS2-VASc Score = 5   This indicates a 7.2% annual risk of stroke. The patient's score is based upon: CHF History: 0 HTN History: 1 Diabetes History: 0 Stroke History: 0 Vascular Disease History: 1 Age Score: 2 Gender Score: 1     Home Medications   Current Meds  Medication Sig   albuterol (VENTOLIN HFA) 108 (90 Base) MCG/ACT inhaler Inhale 1 puff into the lungs 2 (two) times daily. ProAir   apixaban (ELIQUIS) 2.5 MG TABS tablet Take 1 tablet (2.5 mg total) by mouth 2 (two) times daily.   Ascorbic Acid (VITAMIN C) 1000 MG tablet Take 1,000 mg by mouth daily.   beclomethasone (QVAR) 80 MCG/ACT inhaler Inhale 1 puff into the lungs 2 (two) times daily.   Biotin 5 MG TABS Take 5 mg by mouth every other day.   BLACK ELDERBERRY PO Take 10 mLs by mouth daily. Liquid   Calcium-Magnesium-Vitamin D (CALCIUM+D3 GRADUAL RELEASE PO) Take by mouth.   Cholecalciferol (VITAMIN D) 50 MCG (2000 UT) CAPS Take 2,000 Units by mouth daily.   clobetasol (OLUX) 0.05 % topical foam Apply 1 application. topically daily as needed (sensitive scalp).   cloNIDine (CATAPRES) 0.1 MG tablet Take 0.1 mg by mouth 3 (three) times daily. Take a forth tablets if systolic goes over 941   clopidogrel (PLAVIX) 75 MG tablet Take 1 tablet (75 mg total) by mouth daily.   Coenzyme Q10 (CO Q-10) 200 MG  CAPS Take 200 mg by mouth daily.   Homeopathic Products (ARNICA EX) Apply 1 application. topically daily as needed (arthritis). Sore areas   latanoprost (XALATAN) 0.005 % ophthalmic solution Place 1 drop into both eyes at bedtime.   Menthol-Camphor (TIGER BALM NECK & SHOULDER RUB) 10-11 % CREA Apply 1 application. topically daily as needed (arthritis pain).   metoprolol succinate (TOPROL-XL) 25 MG 24 hr tablet Take 2 in the morning and 1 tablet in the evening   Misc Natural Products (ELDERBERRY ZINC/VIT C/IMMUNE MT) Take 50 mg by mouth daily. 90 mg vitamin C 30 mcg vitamin D 7.5 mg Zinc,  15 mg sodium 1 gummy by mouth daily   Multiple Vitamin (MULTIVITAMIN) capsule Take 1 capsule  by mouth daily. Centrum  Silver adult 50+   nitroGLYCERIN (NITROSTAT) 0.4 MG SL tablet Place 1 tablet (0.4 mg total) under the tongue every 5 (five) minutes x 3 doses as needed for chest pain.   polyethylene glycol (MIRALAX / GLYCOLAX) 17 g packet Take 17 g by mouth daily.   rosuvastatin (CRESTOR) 10 MG tablet Take 10 mg by mouth every other day.   triamcinolone (KENALOG) 0.1 % Apply 1 application. topically 2 (two) times daily as needed (rash, itching).     Review of Systems      All other systems reviewed and are otherwise negative except as noted above.  Physical Exam    VS:  BP (!) 146/88 (BP Location: Left Arm, Patient Position: Sitting, Cuff Size: Normal)   Pulse 89   Ht '5\' 5"'$  (1.651 m)   Wt 121 lb 11.2 oz (55.2 kg)   SpO2 99%   BMI 20.25 kg/m  , BMI Body mass index is 20.25 kg/m.  Wt Readings from Last 3 Encounters:  04/27/22 121 lb 11.2 oz (55.2 kg)  03/30/22 115 lb 6.4 oz (52.3 kg)  02/24/22 115 lb 6.4 oz (52.3 kg)    GEN: Well nourished, well developed, in no acute distress. HEENT: normal. Neck: Supple, no JVD, carotid bruits, or masses. Cardiac: IRIR, no murmurs, rubs, or gallops. No clubbing, cyanosis.  Radials/PT 2+ and equal bilaterally. Bilateral LE with 2+ pitting edema.  Respiratory:   Respirations regular and unlabored, clear to auscultation bilaterally. GI: Soft, nontender, nondistended. MS: No deformity or atrophy. Skin: Warm and dry, no rash. Neuro:  Strength and sensation are intact. Psych: Normal affect.  Assessment & Plan    Persistent atrial fibrillation / Hypercoagulable state- Rate controlled today. We reviewed that Metoprolol provides rate control but is not antiarrhythmic. She is interested in restoration of NSR. Continue Metoprolol '50mg'$  BID.   Shared Decision Making/Informed Consent The risks (stroke, cardiac arrhythmias rarely resulting in the need for a temporary or permanent pacemaker, skin irritation or burns and complications associated with conscious sedation including aspiration, arrhythmia, respiratory failure and death), benefits (restoration of normal sinus rhythm) and alternatives of a direct current cardioversion were explained in detail to Ms. Lute and she agrees to proceed.    HTN - BP not at goal in clinic. Reports better at home. She will drop off BP log tomorrow. Noted LE edema on Diltiazem. She continues to self adjust her medication regimen. Will Rx Lasix '20mg'$  QD x 3 days. BP control made difficult by multiple intolerances.  Mild renal artery stenosis - BP control, as above.   HYPERTENSION CONTROL Vitals:   04/27/22 1421 04/27/22 1425  BP: (!) 157/87 (!) 146/88    The patient's blood pressure is elevated above target today.  In order to address the patient's elevated BP: A current anti-hypertensive medication was adjusted today.; Follow up with general cardiology has been recommended.        Cardiac Rehabilitation Eligibility Assessment  The patient is ready to start cardiac rehabilitation from a cardiac standpoint.   Disposition: Follow up  2-3 weeks after cardioversion  with Skeet Latch, MD or APP.  Signed, Loel Dubonnet, NP 04/27/2022, 3:15 PM [facility]

## 2022-04-27 NOTE — Patient Instructions (Signed)
Medication Instructions:  Your physician has recommended you make the following change in your medication:   Continue: Metoprolol '50mg'$  (two '25mg'$  tablets) twice daily   Start: Lasix '20mg'$  for 3 days   *If you need a refill on your cardiac medications before your next appointment, please call your pharmacy*   Lab Work: Please return for Lab work one week prior to Cardioversion on 12/1 for BMP/CBC . You may come to the...   [facility] (3rd floor) [address] [place], [place] [address]  Open: 8am-Noon and 1pm-4:30pm  Please ring the doorbell on the small table when you exit the elevator and the Lab Tech will come get you  [place] [facility] [address] [place], [place], [place] [address] Open: 8am-1pm, then 2pm-4:30pm   [place]- Please see attached locations sheet stapled to your lab work with address and hours.   If you have labs (blood work) drawn today and your tests are completely normal, you will receive your results only by: [place] (if you have MyChart) OR A paper copy in the mail If you have any lab test that is abnormal or we need to change your treatment, we will call you to review the results.   Testing/Procedures:   You are scheduled for a Cardioversion on Friday, December 8 with Dr. Oval Linsey.  Please arrive at the [facility] (Main Entrance A) at [facility]: [address] [place],  [address] at 9:00 AM.   DIET:  Nothing to eat or drink after midnight except a sip of water with medications (see medication instructions below)  MEDICATION INSTRUCTIONS: Continue taking your anticoagulant (blood thinner): Apixaban (Eliquis).  You will need to continue this after your procedure until you are told by your provider that it is safe to stop.    LABS: BMP and CBC on 12/1  FYI:  For your safety, and to allow [place] to monitor your vital signs accurately during the surgery/procedure we request: If you  have artificial nails, gel coating, SNS etc, please have those removed prior to your surgery/procedure. Not having the nail coverings /polish removed may result in cancellation or delay of your surgery/procedure.  You must have a responsible person to drive you home and stay in the waiting area during your procedure. Failure to do so could result in cancellation.  Bring your insurance cards.  *Special Note: Every effort is made to have your procedure done on time. Occasionally there are emergencies that occur at the hospital that may cause delays. Please be patient if a delay does occur.   Follow-Up: At [facility], you and your health needs are our priority.  As part of our continuing mission to provide you with exceptional heart care, we have created designated Provider Care Teams.  These Care Teams include your primary Cardiologist (physician) and Advanced Practice Providers (APPs -  Physician Assistants and Nurse Practitioners) who all work together to provide you with the care you need, when you need it.  We recommend signing up for the patient portal called "MyChart".  Sign up information is provided on this After Visit Summary.  MyChart is used to connect with patients for Virtual Visits (Telemedicine).  Patients are able to view lab/test results, encounter notes, upcoming appointments, etc.  Non-urgent messages can be sent to your provider as well.   To learn more about what you can do with MyChart, go to [email or web address].    Your next appointment:   Follow up  2 weeks post cardioversion with Dr. Oval Linsey or Laurann Montana, NP   Other Instructions Heart Healthy Diet Recommendations: A low-salt diet is recommended. Meats should be grilled, baked, or boiled. Avoid fried foods. Focus on lean protein sources like fish or chicken with vegetables and fruits. The American Heart Association is a Microbiologist!  American Heart Association Diet and Lifeystyle Recommendations    Exercise recommendations: The American Heart Association recommends 150 minutes of moderate intensity exercise weekly. Try 30 minutes of moderate intensity exercise 4-5 times per week. This could include walking, jogging, or swimming.   Important Information About Sugar

## 2022-04-28 ENCOUNTER — Ambulatory Visit (HOSPITAL_BASED_OUTPATIENT_CLINIC_OR_DEPARTMENT_OTHER): Payer: Medicare Other

## 2022-05-11 DIAGNOSIS — M25461 Effusion, right knee: Secondary | ICD-10-CM | POA: Diagnosis not present

## 2022-05-11 DIAGNOSIS — M1711 Unilateral primary osteoarthritis, right knee: Secondary | ICD-10-CM | POA: Diagnosis not present

## 2022-05-15 ENCOUNTER — Ambulatory Visit (HOSPITAL_COMMUNITY): Admit: 2022-05-15 | Payer: Medicare Other | Admitting: Cardiovascular Disease

## 2022-05-15 ENCOUNTER — Encounter (HOSPITAL_COMMUNITY): Payer: Self-pay

## 2022-05-15 SURGERY — CARDIOVERSION
Anesthesia: General

## 2022-05-20 DIAGNOSIS — H401233 Low-tension glaucoma, bilateral, severe stage: Secondary | ICD-10-CM | POA: Diagnosis not present

## 2022-05-20 DIAGNOSIS — H524 Presbyopia: Secondary | ICD-10-CM | POA: Diagnosis not present

## 2022-05-20 DIAGNOSIS — H532 Diplopia: Secondary | ICD-10-CM | POA: Diagnosis not present

## 2022-05-20 DIAGNOSIS — H26491 Other secondary cataract, right eye: Secondary | ICD-10-CM | POA: Diagnosis not present

## 2022-05-20 DIAGNOSIS — H04123 Dry eye syndrome of bilateral lacrimal glands: Secondary | ICD-10-CM | POA: Diagnosis not present

## 2022-05-20 DIAGNOSIS — H5213 Myopia, bilateral: Secondary | ICD-10-CM | POA: Diagnosis not present

## 2022-05-20 DIAGNOSIS — H353132 Nonexudative age-related macular degeneration, bilateral, intermediate dry stage: Secondary | ICD-10-CM | POA: Diagnosis not present

## 2022-05-21 DIAGNOSIS — I25118 Atherosclerotic heart disease of native coronary artery with other forms of angina pectoris: Secondary | ICD-10-CM | POA: Diagnosis not present

## 2022-05-21 DIAGNOSIS — J4540 Moderate persistent asthma, uncomplicated: Secondary | ICD-10-CM | POA: Diagnosis not present

## 2022-05-21 DIAGNOSIS — I10 Essential (primary) hypertension: Secondary | ICD-10-CM | POA: Diagnosis not present

## 2022-05-21 DIAGNOSIS — M810 Age-related osteoporosis without current pathological fracture: Secondary | ICD-10-CM | POA: Diagnosis not present

## 2022-05-21 DIAGNOSIS — E782 Mixed hyperlipidemia: Secondary | ICD-10-CM | POA: Diagnosis not present

## 2022-05-26 ENCOUNTER — Ambulatory Visit: Payer: Medicare Other | Admitting: Podiatry

## 2022-05-26 ENCOUNTER — Telehealth: Payer: Self-pay | Admitting: Cardiovascular Disease

## 2022-05-26 NOTE — Telephone Encounter (Signed)
Advised patient who stated multiple times she would only take the Plavix every other day. Explained to patient why she needed daily, again stated she would not take more than every other day  Left message for Loma Sousa to call back

## 2022-05-26 NOTE — Telephone Encounter (Signed)
Patient is returning call.  °

## 2022-05-26 NOTE — Telephone Encounter (Signed)
History of CAD with most recent PCI 11/2021. Atrial fibrillation onset 12/2021. History of self adjusting medications as she sees fit.   At time of diagnosis of atrial fibrillation, Aspirin was discontinued and she was advised to take Plavix '75mg'$  QD and Eliquis '5mg'$  BID. Most recent CBC stable with no anemia. Would recommend she remain on Eliquis '5mg'$  BID and Plavix '75mg'$  daily. She should contact our office if she notes blood in her urine or her stool that is concerning. Otherwise, we will continue to monitor with periodic lab work.   Eliquis helps protect her from stroke due to atrial fibrillation. Plavix helps to protect from blockages in heart artery. They work differently and can be taken together.  Loel Dubonnet, NP

## 2022-05-26 NOTE — Telephone Encounter (Signed)
Left message for patient to call back  

## 2022-05-26 NOTE — Telephone Encounter (Signed)
Pt c/o medication issue:  1. Name of Medication: clopidogrel (PLAVIX) 75 MG tablet   2. How are you currently taking this medication (dosage and times per day)? One tablet every other day   3. Are you having a reaction (difficulty breathing--STAT)?   4. What is your medication issue? Pt PCP called stating that at pt's televisit with them yesterday, she told that she is now taking this medication every other day because she was concerned about taking this with eliquis.

## 2022-05-26 NOTE — Telephone Encounter (Signed)
Pt. Supposed to be on both plavix and eliquis?

## 2022-05-27 NOTE — Telephone Encounter (Signed)
Returned call to [place] and advised that we had spoken with patient and provided recommendations, call to be removed from triage.

## 2022-05-28 ENCOUNTER — Encounter (HOSPITAL_BASED_OUTPATIENT_CLINIC_OR_DEPARTMENT_OTHER): Payer: Self-pay | Admitting: Family

## 2022-05-28 ENCOUNTER — Ambulatory Visit (INDEPENDENT_AMBULATORY_CARE_PROVIDER_SITE_OTHER): Payer: Medicare Other | Admitting: Family

## 2022-05-28 ENCOUNTER — Other Ambulatory Visit (HOSPITAL_BASED_OUTPATIENT_CLINIC_OR_DEPARTMENT_OTHER): Payer: Self-pay | Admitting: Family

## 2022-05-28 VITALS — BP 136/80 | HR 94 | Ht 65.0 in | Wt 122.0 lb

## 2022-05-28 DIAGNOSIS — I10 Essential (primary) hypertension: Secondary | ICD-10-CM

## 2022-05-28 DIAGNOSIS — E875 Hyperkalemia: Secondary | ICD-10-CM | POA: Diagnosis not present

## 2022-05-28 DIAGNOSIS — I4819 Other persistent atrial fibrillation: Secondary | ICD-10-CM

## 2022-05-28 DIAGNOSIS — E782 Mixed hyperlipidemia: Secondary | ICD-10-CM

## 2022-05-28 DIAGNOSIS — D6859 Other primary thrombophilia: Secondary | ICD-10-CM

## 2022-05-28 MED ORDER — ROSUVASTATIN CALCIUM 10 MG PO TABS: 10.0000 mg | ORAL_TABLET | ORAL | 3 refills | Status: DC

## 2022-05-28 MED ORDER — METOPROLOL SUCCINATE ER 25 MG PO TB24: 50.0000 mg | ORAL_TABLET | Freq: Two times a day (BID) | ORAL | 1 refills | Status: DC

## 2022-05-28 NOTE — Patient Instructions (Addendum)
Medication Instructions:  Continue your current medications.   Remain off Furosemide until labs result.   *If you need a refill on your cardiac medications before your next appointment, please call your pharmacy*   Lab Work/Testing/Procedures: None ordered today.   Follow-Up: At [facility], you and your health needs are our priority.  As part of our continuing mission to provide you with exceptional heart care, we have created designated Provider Care Teams.  These Care Teams include your primary Cardiologist (physician) and Advanced Practice Providers (APPs -  Physician Assistants and Nurse Practitioners) who all work together to provide you with the care you need, when you need it.  We recommend signing up for the patient portal called "MyChart".  Sign up information is provided on this After Visit Summary.  MyChart is used to connect with patients for Virtual Visits (Telemedicine).  Patients are able to view lab/test results, encounter notes, upcoming appointments, etc.  Non-urgent messages can be sent to your provider as well.   To learn more about what you can do with MyChart, go to [email or web address].    Your next appointment:   3-4 month(s)  The format for your next appointment:   In Person  Provider:   Skeet Latch, MD    Other Instructions  Tips to Measure your Blood Pressure Correctly   Here's what you can do to ensure a correct reading:  Don't drink a caffeinated beverage or smoke during the 30 minutes before the test.  Sit quietly for five minutes before the test begins.  During the measurement, sit in a chair with your feet on the floor and your arm supported so your elbow is at about heart level.  The inflatable part of the cuff should completely cover at least 80% of your upper arm, and the cuff should be placed on bare skin, not over a shirt.  Don't talk during the measurement.   Blood pressure categories  Blood pressure category  SYSTOLIC (upper number)  DIASTOLIC (lower number)  Normal Less than 120 mm Hg and Less than 80 mm Hg  Elevated 120-129 mm Hg and Less than 80 mm Hg  High blood pressure: Stage 1 hypertension 130-139 mm Hg or 80-89 mm Hg  High blood pressure: Stage 2 hypertension 140 mm Hg or higher or 90 mm Hg or higher  Hypertensive crisis (consult your doctor immediately) Higher than 180 mm Hg and/or Higher than 120 mm Hg  Source: American Heart Association and American Stroke Association. For more on getting your blood pressure under control, buy Controlling Your Blood Pressure, a Special Health Report from [facility].   Blood Pressure Log   Date   Time  Blood Pressure  Example: Nov 1 9 AM 124/78

## 2022-05-28 NOTE — Progress Notes (Signed)
Office Visit    Patient Name: Kendra [name] Date of Encounter: 05/31/2022  PCP:  Donald Prose, [place]  Cardiologist:  Skeet Latch, MD  Advanced Practice Provider:  No care team member to display Electrophysiologist:  None      Chief Complaint    Kendra [name] is a 86 y.o. female presents today for follow up of hypertension   Past Medical History    Past Medical History:  Diagnosis Date   Asthma    CAD (coronary artery disease)    last cath 08/2006   Carotid artery disease (HCC)    moderate left ICA stenosis by 2.6 ultrasound   DJD (degenerative joint disease), lumbar    Hearing loss    Hyperlipidemia    Renal artery stenosis ([place]) 05/05/2021   Vision loss of right eye    Past Surgical History:  Procedure Laterality Date   ABDOMINAL HYSTERECTOMY  1975   APPENDECTOMY     CORONARY ANGIOPLASTY WITH STENT PLACEMENT  08/2006   taxus stent x2 to LAD, 2.5x33m and 2.5x824m [facility]/A 11/07/2021   Procedure: INTRAVASCULAR PRESSURE WIRE/FFR STUDY;  Surgeon: ThEarly OsmondMD;  [facility];  Service: Cardiovascular;  Laterality: N/A;   LEFT HEART CATH AND CORONARY ANGIOGRAPHY N/A 11/07/2021   Procedure: LEFT HEART CATH AND CORONARY ANGIOGRAPHY;  Surgeon: ThEarly OsmondMD;  [facility];  Service: Cardiovascular;  Laterality: N/A;   RENAL [facility]  [facility]  Allergies  Allergies  Allergen Reactions   Chlorthalidone Other (See Comments)    Hyponatremia   Other     Other reaction(s): Respiratory Distress   Amlodipine     "shivers"    Ciprofloxacin Other (See Comments)    UNK reaction   Dog Epithelium Allergy Skin Test Other (See Comments)    Cat and dog dander   Irbesartan Other (See Comments)   Latex Other (See Comments)    Irritates her skin   Mixed Feathers     Nebivolol Hcl     Other reaction(s): cant remember   Statins     Unable to tolerate statins w the exception of crestor.   Sulfa Antibiotics Other (See Comments)    "didnt feel good"   Sulfamethoxazole Other (See Comments)    Makes her feel bad   Zetia [Ezetimibe] Other (See Comments)    "makes me constipated"   Penicillins Rash    History of Present Illness    Kendra [name] a [age].o. female with a hx of atrial fibrillation, hypertension, asthma, CAD s/p LAD PCI, carotic stenosis, HLD, DJD, renal artery stenosis last seen 03/30/22 by Dr. RaOval Linsey Blood pressure management has been limited by medication intolerances. Renal artery dopplers 10/2020 >50% stenosis bilaterally. CTA abd/pelvis 04/2021 50% prox L renal artery stenosis and 2 right renal arteries. There was 40% prox stenosis in larger argery. Mild plaque in SMA and some stenosis in IMA. 50% bilateral common iliac artery stenosis.   Established with ADV HTN cliic with BP 214/76. Losartan switched to Irbesartan. Hydralazine stopped. Started on Spironolactone. Clonidine adjusted.   Admitted 11/2021 for NSTEMI with LHC showing moderate ISR of prior LAD stent treated with balloon angioplasty. Transitioned from Brilinta to Plavix due to cost. She had episode of atrial fibrillation and was seen by atrial fib  clinic recommended for DCCV but she cancelled the procedure.   Seen 03/2022 feeling overall well. Heart rate at home in the 80s. BP not at goal. She declined cardioversion. Lisinopril was stopped as she reported not tolerating. She was started on Diltiazem '180mg'$  QD.   Seen 04/2022 noting she did not tolerate Diltiazem due to cswelling. She reported BP was controlled at home and declined medication changes. She was agreeable to Lasix '20mg'$  QD x 3 days for swelling. She was set up for cardioversion but subsequently cancelled.   Pleasant lady who presents today for follow up. She is very involved in her syn[employer]. Brings previous  BP log with readings 120s-130s. Has not been checking recently due to being busy. Tells me she had bring her husband to the ED earlier this week due to allergic reaction  which has been understandably stressful. H Tells me her LE edema is "incrementally improved". She continued Lasix '20mg'$  daily for 1 week then stopped as she was concerned about hyponatremia. She felt the metoprolol succinate '50mg'$  tablet caused stomach pains but tolerates two of the '25mg'$  tablets. She notes a lot of trouble with arthritis and muscles. Concerned about crestor, metoprolol, clonidine. We discussed that metoprolol, clonidine would be unlikely to cause myalgias. She uses topical agents with some improvement.   EKGs/Labs/Other Studies Reviewed:   The following studies were reviewed today:  EKG:  EKG is not ordered today.  Recent Labs: 01/15/2022: ALT 25; Hemoglobin 14.3; Magnesium 2.1; Platelets 284; TSH 1.970 05/28/2022: BUN 17; Creatinine, Ser 0.70; Potassium 5.1; Sodium 138  Recent Lipid Panel    Component Value Date/Time   CHOL 132 11/07/2021 0158   TRIG 27 11/07/2021 0158   HDL 64 11/07/2021 0158   CHOLHDL 2.1 11/07/2021 0158   VLDL 5 11/07/2021 0158   LDLCALC 63 11/07/2021 0158    Risk Assessment/Calculations:   CHA2DS2-VASc Score = 5   This indicates a 7.2% annual risk of stroke. The patient's score is based upon: CHF History: 0 HTN History: 1 Diabetes History: 0 Stroke History: 0 Vascular Disease History: 1 Age Score: 2 Gender Score: 1     Home Medications   Current Meds  Medication Sig   albuterol (VENTOLIN HFA) 108 (90 Base) MCG/ACT inhaler Inhale 1 puff into the lungs 2 (two) times daily. ProAir   apixaban (ELIQUIS) 2.5 MG TABS tablet Take 1 tablet (2.5 mg total) by mouth 2 (two) times daily.   Ascorbic Acid (VITAMIN C) 1000 MG tablet Take 1,000 mg by mouth daily.   beclomethasone (QVAR) 80 MCG/ACT inhaler Inhale 1 puff into the lungs 2 (two) times daily.   Biotin 5 MG TABS Take 5 mg by  mouth every other day.   BLACK ELDERBERRY PO Take 10 mLs by mouth daily. Liquid   Calcium-Magnesium-Vitamin D (CALCIUM+D3 GRADUAL RELEASE PO) Take by mouth.   Cholecalciferol (VITAMIN D) 50 MCG (2000 UT) CAPS Take 2,000 Units by mouth daily.   clobetasol (OLUX) 0.05 % topical foam Apply 1 application. topically daily as needed (sensitive scalp).   cloNIDine (CATAPRES) 0.1 MG tablet Take 0.1 mg by mouth 3 (three) times daily. Take a forth tablets if systolic goes over 829   clopidogrel (PLAVIX) 75 MG tablet Take 1 tablet (75 mg total) by mouth daily.   Coenzyme Q10 (CO Q-10) 200 MG CAPS Take 200 mg by mouth daily.   Homeopathic Products (ARNICA EX) Apply 1 application. topically daily as needed (arthritis). Sore areas   latanoprost (XALATAN) 0.005 % ophthalmic solution Place  1 drop into both eyes at bedtime.   Menthol-Camphor (TIGER BALM NECK & SHOULDER RUB) 10-11 % CREA Apply 1 application. topically daily as needed (arthritis pain).   Misc Natural Products (ELDERBERRY ZINC/VIT C/IMMUNE MT) Take 50 mg by mouth daily. 90 mg vitamin C 30 mcg vitamin D 7.5 mg Zinc,  15 mg sodium 1 gummy by mouth daily   Multiple Vitamin (MULTIVITAMIN) capsule Take 1 capsule by mouth daily. Centrum  Silver adult 50+   nitroGLYCERIN (NITROSTAT) 0.4 MG SL tablet Place 1 tablet (0.4 mg total) under the tongue every 5 (five) minutes x 3 doses as needed for chest pain.   polyethylene glycol (MIRALAX / GLYCOLAX) 17 g packet Take 17 g by mouth daily.   triamcinolone (KENALOG) 0.1 % Apply 1 application. topically 2 (two) times daily as needed (rash, itching).   [DISCONTINUED] furosemide (LASIX) 20 MG tablet Take 1 tablet (20 mg total) by mouth daily.   [DISCONTINUED] metoprolol succinate (TOPROL-XL) 25 MG 24 hr tablet Take 2 in the morning and 1 tablet in the evening   [DISCONTINUED] rosuvastatin (CRESTOR) 10 MG tablet Take 10 mg by mouth every other day.     Review of Systems      All other systems reviewed and are  otherwise negative except as noted above.  Physical Exam    VS:  BP 136/80 (BP Location: Left Arm)   Pulse 94   Ht '5\' 5"'$  (1.651 m)   Wt 122 lb (55.3 kg)   BMI 20.30 kg/m  , BMI Body mass index is 20.3 kg/m.  Wt Readings from Last 3 Encounters:  05/28/22 122 lb (55.3 kg)  04/27/22 121 lb 11.2 oz (55.2 kg)  03/30/22 115 lb 6.4 oz (52.3 kg)    GEN: Well nourished, well developed, in no acute distress. HEENT: normal. Neck: Supple, no JVD, carotid bruits, or masses. Cardiac: IRIR, no murmurs, rubs, or gallops. No clubbing, cyanosis.  Radials/PT 2+ and equal bilaterally. Bilateral LE with trace edema.  Respiratory:  Respirations regular and unlabored, clear to auscultation bilaterally. GI: Soft, nontender, nondistended. MS: No deformity or atrophy. Skin: Warm and dry, no rash. Neuro:  Strength and sensation are intact. Psych: Normal affect.  Assessment & Plan    Persistent atrial fibrillation / Hypercoagulable state- Rate controlled today. Notes if her heart rate is more than 100 bpm she will note palpitations. She was previously set up for cardioversion which she cancelled due to other things going on. We reviewed that Metoprolol provides rate control but is not antiarrhythmic. S Continue Metoprolol '50mg'$  BID - she prefers to take two of the '25mg'$  tablets.   HTN - BP not at goal in clinic. Reports better at home. Multiple medication intolerances including  LE edema on Diltiazem. Discussed to monitor BP at home at least 2 hours after medications and sitting for 5-10 minutes. She will contact us if persistently >130/80.   LE edema - Trace edema bilateral legs which she is very bothered by. Likely venous insufficiency. She had labs this morning for reassessment of renal function, electrolytes. Await lab results prior to making medication changes. Remain off Furosemide. Consider Lasix '40mg'$  daily x 2-3 days then '20mg'$  PRN pending lab results. Elevation, compression stockings encouraged.   Mild  renal artery stenosis - BP control, as above.      Cardiac Rehabilitation Eligibility Assessment  The patient is ready to start cardiac rehabilitation from a cardiac standpoint.   Disposition: Follow up  in 3-4 mos  with Skeet Latch,  MD or APP.  Signed, Loel Dubonnet, NP 05/31/2022, 3:25 PM [place]

## 2022-05-29 ENCOUNTER — Telehealth (HOSPITAL_BASED_OUTPATIENT_CLINIC_OR_DEPARTMENT_OTHER): Payer: Self-pay | Admitting: *Deleted

## 2022-05-29 LAB — BASIC METABOLIC PANEL
BUN/Creatinine Ratio: 24 (ref 12–28)
BUN: 17 mg/dL (ref 8–27)
CO2: 24 mmol/L (ref 20–29)
Calcium: 9.4 mg/dL (ref 8.7–10.3)
Chloride: 101 mmol/L (ref 96–106)
Creatinine, Ser: 0.7 mg/dL (ref 0.57–1.00)
Glucose: 108 mg/dL — ABNORMAL HIGH (ref 70–99)
Potassium: 5.1 mmol/L (ref 3.5–5.2)
Sodium: 138 mmol/L (ref 134–144)
eGFR: 85 mL/min/{1.73_m2} (ref >59)

## 2022-05-29 MED ORDER — FUROSEMIDE 20 MG PO TABS: ORAL_TABLET | ORAL | 3 refills | Status: DC

## 2022-05-29 NOTE — Telephone Encounter (Signed)
-----   Message from Loel Dubonnet, NP sent at 05/29/2022  8:07 AM EST ----- Normal kidney function and electrolytes. Good result!   As she reported edema in clinic yesterday - may take Furosemide '40mg'$  (2 of her '20mg'$  tablets) daily for 2 days then than one tablet as needed for edema. She has Furosemide at home but please update Rx on med list.

## 2022-05-29 NOTE — Telephone Encounter (Signed)
Patient read in mychart Left message to call back if any questions Updated Rx

## 2022-05-29 NOTE — Telephone Encounter (Signed)
Please advise on clarification note from pharmacy. Your note says to take Metoprolol 50 mg BID.

## 2022-05-31 ENCOUNTER — Encounter (HOSPITAL_BASED_OUTPATIENT_CLINIC_OR_DEPARTMENT_OTHER): Payer: Self-pay | Admitting: Family

## 2022-06-05 ENCOUNTER — Ambulatory Visit (HOSPITAL_BASED_OUTPATIENT_CLINIC_OR_DEPARTMENT_OTHER)
Admission: RE | Admit: 2022-06-05 | Discharge: 2022-06-05 | Disposition: A | Discharge status: Home / Self Care | Payer: Medicare Other | Source: Ambulatory Visit | Attending: Cardiovascular Disease | Admitting: Cardiovascular Disease

## 2022-06-05 DIAGNOSIS — I10 Essential (primary) hypertension: Secondary | ICD-10-CM | POA: Diagnosis not present

## 2022-06-05 DIAGNOSIS — I701 Atherosclerosis of renal artery: Secondary | ICD-10-CM | POA: Diagnosis not present

## 2022-06-05 DIAGNOSIS — R140 Abdominal distension (gaseous): Secondary | ICD-10-CM | POA: Diagnosis not present

## 2022-06-05 DIAGNOSIS — J90 Pleural effusion, not elsewhere classified: Secondary | ICD-10-CM | POA: Diagnosis not present

## 2022-06-05 MED ORDER — IOHEXOL 350 MG/ML SOLN
100.0000 mL | Freq: Once | INTRAVENOUS | Status: AC | PRN
  Administered 2022-06-05: 100 mL via INTRAVENOUS

## 2022-06-09 NOTE — Telephone Encounter (Signed)
Rx request sent to pharmacy.  

## 2022-06-12 ENCOUNTER — Telehealth: Payer: Self-pay | Admitting: Cardiovascular Disease

## 2022-06-12 DIAGNOSIS — D6859 Other primary thrombophilia: Secondary | ICD-10-CM

## 2022-06-12 DIAGNOSIS — I4819 Other persistent atrial fibrillation: Secondary | ICD-10-CM

## 2022-06-12 DIAGNOSIS — I10 Essential (primary) hypertension: Secondary | ICD-10-CM

## 2022-06-12 MED ORDER — METOPROLOL SUCCINATE ER 25 MG PO TB24: 50.0000 mg | ORAL_TABLET | Freq: Two times a day (BID) | ORAL | 1 refills | Status: DC

## 2022-06-12 NOTE — Telephone Encounter (Signed)
Rx request sent to pharmacy.  

## 2022-06-12 NOTE — Telephone Encounter (Signed)
 *  STAT* If patient is at the pharmacy, call can be transferred to refill team.   1. Which medications need to be refilled? (please list name of each medication and dose if known) metoprolol succinate (TOPROL-XL) 25 MG 24 hr tablet   2. Which pharmacy/location (including street and city if local pharmacy) is medication to be sent to?  [id number]- [place], [place] - [address]    3. Do they need a 30 day or 90 day supply? 90 days  Pharmacy did not receive prescription, pt request to resend refill, she is almost out of meds

## 2022-06-29 ENCOUNTER — Ambulatory Visit (INDEPENDENT_AMBULATORY_CARE_PROVIDER_SITE_OTHER): Payer: Medicare Other | Admitting: Podiatry

## 2022-06-29 ENCOUNTER — Encounter: Payer: Self-pay | Admitting: Podiatry

## 2022-06-29 DIAGNOSIS — D689 Coagulation defect, unspecified: Secondary | ICD-10-CM

## 2022-06-29 DIAGNOSIS — I872 Venous insufficiency (chronic) (peripheral): Secondary | ICD-10-CM

## 2022-06-29 DIAGNOSIS — M79674 Pain in right toe(s): Secondary | ICD-10-CM

## 2022-06-29 DIAGNOSIS — M79675 Pain in left toe(s): Secondary | ICD-10-CM

## 2022-06-29 DIAGNOSIS — B351 Tinea unguium: Secondary | ICD-10-CM | POA: Diagnosis not present

## 2022-06-29 NOTE — Progress Notes (Signed)
  Subjective:  Patient ID: Kendra [name], female    DOB: 1935-10-17,   MRN: [id number]  Chief Complaint  Patient presents with   Nail Problem    Nail trim     87 y.o. female presents for concern of toenail fungus. Requesting to have nails trimmed today relates she was recently started on a blood thinner after procedure done in January. . Denies any other pedal complaints. Denies n/v/f/c.   Past Medical History:  Diagnosis Date   Asthma    CAD (coronary artery disease)    last cath 08/2006   Carotid artery disease (HCC)    moderate left ICA stenosis by 2.6 ultrasound   DJD (degenerative joint disease), lumbar    Hearing loss    Hyperlipidemia    Renal artery stenosis ([place]) 05/05/2021   Vision loss of right eye     Objective:  Physical Exam: Vascular: DP/PT pulses 2/4 bilateral. CFT <3 seconds. Normal hair growth on digits. No edema.  Skin. No lacerations or abrasions bilateral feet. Nails 1-5 are thickened discolored and elongated with subungual debris on left. Right appear normal thickness color and length. Musculoskeletal: MMT 5/5 bilateral lower extremities in DF, PF, Inversion and Eversion. Deceased ROM in DF of ankle joint.  Neurological: Sensation intact to light touch.   Assessment:   1. Pain due to onychomycosis of toenails of both feet   2. Peripheral venous insufficiency   3. Coagulation defect [facility])          Plan:  Patient was evaluated and treated and all questions answered. -Examined patient -Discussed treatment options for painful dystrophic nails  --Discussed and educated patient on foot care, especially with  regards to the vascular, neurological and musculoskeletal systems.  -Discussed supportive shoes at all times and checking feet regularly.  -Mechanically debrided all nails 1-5 bilateral using sterile nail nipper and filed with dremel without incident  -Answered all patient questions -Patient to return  in 3 months for at risk foot  care -Patient advised to call the office if any problems or questions arise in the meantime.    Lorenda Peck, DPM

## 2022-06-30 DIAGNOSIS — H26491 Other secondary cataract, right eye: Secondary | ICD-10-CM | POA: Diagnosis not present

## 2022-07-01 DIAGNOSIS — I25118 Atherosclerotic heart disease of native coronary artery with other forms of angina pectoris: Secondary | ICD-10-CM | POA: Diagnosis not present

## 2022-07-01 DIAGNOSIS — M810 Age-related osteoporosis without current pathological fracture: Secondary | ICD-10-CM | POA: Diagnosis not present

## 2022-07-01 DIAGNOSIS — I4819 Other persistent atrial fibrillation: Secondary | ICD-10-CM | POA: Diagnosis not present

## 2022-07-01 DIAGNOSIS — E782 Mixed hyperlipidemia: Secondary | ICD-10-CM | POA: Diagnosis not present

## 2022-07-01 DIAGNOSIS — I10 Essential (primary) hypertension: Secondary | ICD-10-CM | POA: Diagnosis not present

## 2022-07-06 DIAGNOSIS — M1711 Unilateral primary osteoarthritis, right knee: Secondary | ICD-10-CM | POA: Diagnosis not present

## 2022-07-06 DIAGNOSIS — M25461 Effusion, right knee: Secondary | ICD-10-CM | POA: Diagnosis not present

## 2022-07-15 DIAGNOSIS — L659 Nonscarring hair loss, unspecified: Secondary | ICD-10-CM | POA: Diagnosis not present

## 2022-07-15 DIAGNOSIS — D6869 Other thrombophilia: Secondary | ICD-10-CM | POA: Diagnosis not present

## 2022-07-15 DIAGNOSIS — H3530 Unspecified macular degeneration: Secondary | ICD-10-CM | POA: Diagnosis not present

## 2022-07-15 DIAGNOSIS — I10 Essential (primary) hypertension: Secondary | ICD-10-CM | POA: Diagnosis not present

## 2022-07-15 DIAGNOSIS — R7303 Prediabetes: Secondary | ICD-10-CM | POA: Diagnosis not present

## 2022-07-15 DIAGNOSIS — I4819 Other persistent atrial fibrillation: Secondary | ICD-10-CM | POA: Diagnosis not present

## 2022-07-15 DIAGNOSIS — M810 Age-related osteoporosis without current pathological fracture: Secondary | ICD-10-CM | POA: Diagnosis not present

## 2022-07-15 DIAGNOSIS — E782 Mixed hyperlipidemia: Secondary | ICD-10-CM | POA: Diagnosis not present

## 2022-07-28 DIAGNOSIS — U071 COVID-19: Secondary | ICD-10-CM | POA: Diagnosis not present

## 2022-08-12 DIAGNOSIS — M25461 Effusion, right knee: Secondary | ICD-10-CM | POA: Diagnosis not present

## 2022-08-12 DIAGNOSIS — M25462 Effusion, left knee: Secondary | ICD-10-CM | POA: Diagnosis not present

## 2022-08-18 DIAGNOSIS — H401233 Low-tension glaucoma, bilateral, severe stage: Secondary | ICD-10-CM | POA: Diagnosis not present

## 2022-08-19 DIAGNOSIS — M25662 Stiffness of left knee, not elsewhere classified: Secondary | ICD-10-CM | POA: Diagnosis not present

## 2022-08-19 DIAGNOSIS — M6281 Muscle weakness (generalized): Secondary | ICD-10-CM | POA: Diagnosis not present

## 2022-08-19 DIAGNOSIS — M170 Bilateral primary osteoarthritis of knee: Secondary | ICD-10-CM | POA: Diagnosis not present

## 2022-08-19 DIAGNOSIS — M25661 Stiffness of right knee, not elsewhere classified: Secondary | ICD-10-CM | POA: Diagnosis not present

## 2022-08-21 DIAGNOSIS — M25552 Pain in left hip: Secondary | ICD-10-CM | POA: Diagnosis not present

## 2022-08-24 DIAGNOSIS — M170 Bilateral primary osteoarthritis of knee: Secondary | ICD-10-CM | POA: Diagnosis not present

## 2022-08-24 DIAGNOSIS — M25662 Stiffness of left knee, not elsewhere classified: Secondary | ICD-10-CM | POA: Diagnosis not present

## 2022-08-24 DIAGNOSIS — M6281 Muscle weakness (generalized): Secondary | ICD-10-CM | POA: Diagnosis not present

## 2022-08-24 DIAGNOSIS — M25661 Stiffness of right knee, not elsewhere classified: Secondary | ICD-10-CM | POA: Diagnosis not present

## 2022-08-26 ENCOUNTER — Other Ambulatory Visit (HOSPITAL_COMMUNITY): Payer: Self-pay | Admitting: Physician Assistant

## 2022-08-26 NOTE — Telephone Encounter (Signed)
Pt last saw Laurann Montana, NP on 05/28/22, last labs 05/28/22 Creat 0.70, age 87, weight 55.3kg, based on specified criteria pt is on appropriate dosage of Eliquis 2.5mg  BID for afib.  Will refill rx.

## 2022-08-28 ENCOUNTER — Ambulatory Visit (INDEPENDENT_AMBULATORY_CARE_PROVIDER_SITE_OTHER): Payer: Medicare Other | Admitting: Family

## 2022-08-28 ENCOUNTER — Encounter (HOSPITAL_BASED_OUTPATIENT_CLINIC_OR_DEPARTMENT_OTHER): Payer: Self-pay | Admitting: Family

## 2022-08-28 VITALS — BP 132/82 | HR 98 | Ht 65.0 in | Wt 123.8 lb

## 2022-08-28 DIAGNOSIS — D6859 Other primary thrombophilia: Secondary | ICD-10-CM | POA: Diagnosis not present

## 2022-08-28 DIAGNOSIS — I10 Essential (primary) hypertension: Secondary | ICD-10-CM

## 2022-08-28 DIAGNOSIS — I4819 Other persistent atrial fibrillation: Secondary | ICD-10-CM | POA: Diagnosis not present

## 2022-08-28 MED ORDER — METOPROLOL SUCCINATE ER 25 MG PO TB24: 25.0000 mg | ORAL_TABLET | Freq: Three times a day (TID) | ORAL | 3 refills | Status: DC

## 2022-08-28 MED ORDER — ASPIRIN 81 MG PO TBEC: 81.0000 mg | DELAYED_RELEASE_TABLET | Freq: Every day | ORAL | 3 refills | Status: AC

## 2022-08-28 NOTE — Progress Notes (Unsigned)
Office Visit    Patient Name: Kendra [name] Date of Encounter: 08/29/2022  PCP:  Donald Prose, [place]  Cardiologist:  Skeet Latch, MD  Advanced Practice Provider:  No care team member to display Electrophysiologist:  None      Chief Complaint    Kendra [name] is a 87 y.o. female presents today for follow up of hypertension   Past Medical History    Past Medical History:  Diagnosis Date   Asthma    CAD (coronary artery disease)    last cath 08/2006   Carotid artery disease (HCC)    moderate left ICA stenosis by 2.6 ultrasound   DJD (degenerative joint disease), lumbar    Hearing loss    Hyperlipidemia    Renal artery stenosis ([place]) 05/05/2021   Vision loss of right eye    Past Surgical History:  Procedure Laterality Date   ABDOMINAL HYSTERECTOMY  1975   APPENDECTOMY     CORONARY ANGIOPLASTY WITH STENT PLACEMENT  08/2006   taxus stent x2 to LAD, 2.5x11mm and 2.5x78mm; done at [facility] WIRE/FFR STUDY N/A 11/07/2021   Procedure: INTRAVASCULAR PRESSURE WIRE/FFR STUDY;  Surgeon: Early Osmond, MD;  Location: [facility];  Service: Cardiovascular;  Laterality: N/A;   LEFT HEART CATH AND CORONARY ANGIOGRAPHY N/A 11/07/2021   Procedure: LEFT HEART CATH AND CORONARY ANGIOGRAPHY;  Surgeon: Early Osmond, MD;  Location: [facility];  Service: Cardiovascular;  Laterality: N/A;   RENAL ANGIOGRAM  [facility] in [place]    Allergies  Allergies  Allergen Reactions   Chlorthalidone Other (See Comments)    Hyponatremia   Other     Other reaction(s): Respiratory Distress   Amlodipine     "shivers"    Ciprofloxacin Other (See Comments)    UNK reaction   Dog Epithelium (Canis Lupus Familiaris) Other (See Comments)    Cat and dog dander   Irbesartan Other (See Comments)   Latex Other (See Comments)    Irritates her skin   Mixed Feathers     Nebivolol Hcl     Other reaction(s): cant remember   Statins     Unable to tolerate statins w the exception of crestor.   Sulfa Antibiotics Other (See Comments)    "didnt feel good"   Sulfamethoxazole Other (See Comments)    Makes her feel bad   Zetia [Ezetimibe] Other (See Comments)    "makes me constipated"   Penicillins Rash    History of Present Illness    Kendra [name] is a 87 y.o. female with a hx of atrial fibrillation, hypertension, asthma, CAD s/p LAD PCI, carotic stenosis, HLD, DJD, renal artery stenosis last seen 05/28/22.  Blood pressure management has been limited by medication intolerances. Renal artery dopplers 10/2020 >50% stenosis bilaterally. CTA abd/pelvis 04/2021 50% prox L renal artery stenosis and 2 right renal arteries. There was 40% prox stenosis in larger argery. Mild plaque in SMA and some stenosis in IMA. 50% bilateral common iliac artery stenosis.   Established with [facility] HTN clinic with BP 214/76. Losartan switched to Irbesartan. Hydralazine stopped. Started on Spironolactone. Clonidine adjusted.   Admitted 11/2021 for NSTEMI with LHC showing moderate ISR of prior LAD stent treated with balloon angioplasty. Transitioned from Brilinta to Plavix due to cost. She had episode of atrial fibrillation and was seen by atrial fib clinic recommended for  DCCV but she cancelled the procedure.   Seen 03/2022 feeling overall well. Heart rate at home in the 80s. BP not at goal. She declined cardioversion. Lisinopril was stopped as she reported not tolerating. She was started on Diltiazem 180mg  QD.   Seen 04/2022 noting she did not tolerate Diltiazem due to swelling. She was agreeable to Lasix 20mg  QD x 3 days for swelling. She was set up for cardioversion but subsequently cancelled.   Last seen 05/28/22 with no medication changes made at that time as she reported controlled at home and declined medication changes.   Pleasant lady who presents today for follow up. She  is very involved in her syn[employer]. Notes a week ago she injured her left hip while cleaning (no fall) with some bruising but soreness has been improving. She self discontinued her Plavix after the new year due to bruising. Has seen improvement in her bruising. Tells me she is "done with Plavix". Tells me her swelling in her legs has been about the same. She has taken the Furosemide intermittently with improvement. Checking BP at home with wrist cuff with readings 130s-140s.  She feels her Metoprolol is making her breathing worse. She previously felt the two 25mg  tablets of Metoprolol were kinder to her stomach but now notes two hours after taking her two tablets of Metoprolol she gets a stomach ache. She also is concerned about metoprolol contributing to asthma - discussed it is cardioselective.  She feels her breathing has been worsening over the last year - her PCP manages her QVAR. Also reports it is making her nose run which is improved with using Flonase, discussed thi smay be related to seasonal allergies rather than Metoprolol. She has been researching her medications on the [facility] website and [place].    EKGs/Labs/Other Studies Reviewed:   The following studies were reviewed today:  Cardiac Studies & Procedures   CARDIAC CATHETERIZATION  CARDIAC CATHETERIZATION 11/07/2021  Narrative   Mid LAD lesion is 65% stenosed.   Scoring balloon angioplasty was performed.   Post intervention, there is a 0% residual stenosis.   LV end diastolic pressure is low.  1.  Moderate in-stent restenosis with an RFR of 0.88 with minimal disease elsewhere.  This was treated with OCT guided balloon angioplasty with a scoring balloon. 2.  LVEDP of 13 mmHg.  Recommendation: Dual antiplatelet therapy for 1 year.  Findings Coronary Findings Diagnostic  Dominance: Right  Left Anterior Descending Mid LAD lesion is 65% stenosed. The lesion was previously treated .  Left Circumflex The vessel  exhibits minimal luminal irregularities.  Right Coronary Artery The vessel exhibits minimal luminal irregularities.  Intervention  Mid LAD lesion Angioplasty Scoring balloon angioplasty was performed. Post-Intervention Lesion Assessment The intervention was successful. Pre-interventional TIMI flow is 3. Post-intervention TIMI flow is 3. No complications occurred at this lesion. There is a 0% residual stenosis post intervention.     ECHOCARDIOGRAM  ECHOCARDIOGRAM COMPLETE 11/07/2021  Narrative ECHOCARDIOGRAM REPORT    Patient Name:   TIJANA TRUDGEON Date of Exam: 11/07/2021 Medical Rec #:  [id number]           Height:       65.0 in Accession #:    [id number]          Weight:       117.1 lb Date of Birth:  [birth year]           BSA:          1.575 m Patient Age:  85 years            BP:           176/67 mmHg Patient Gender: F                   HR:           39 bpm. Exam Location:  Inpatient  Procedure: 2D Echo, 3D Echo, Cardiac Doppler and Color Doppler  Indications:    R07.9* Chest pain, unspecified  History:        Patient has prior history of Echocardiogram examinations, most recent 04/10/2020. Previous Myocardial Infarction and CAD, Abnormal ECG, Signs/Symptoms:Edema; Risk Factors:Hypertension and Dyslipidemia.  Sonographer:    Roseanna Rainbow RDCS Referring Phys: [id number] Darreld Mclean   Sonographer Comments: Technically difficult study due to poor echo windows. IMPRESSIONS   1. Left ventricular ejection fraction, by estimation, is 60 to 65%. The left ventricle has normal function. The left ventricle has no regional wall motion abnormalities. Left ventricular diastolic function could not be evaluated. 2. Right ventricular systolic function is normal. The right ventricular size is normal. Tricuspid regurgitation signal is inadequate for assessing PA pressure. 3. Left atrial size was mildly dilated. 4. The mitral valve is normal in structure. No evidence of mitral  valve regurgitation. Moderate mitral annular calcification. 5. The aortic valve is normal in structure. There is mild calcification of the aortic valve. Aortic valve regurgitation is not visualized. 6. Aortic no significant ascending aortic aneurysm. 7. The inferior vena cava is dilated in size with >50% respiratory variability, suggesting right atrial pressure of 8 mmHg.  Comparison(s): No significant change from prior study.  FINDINGS Left Ventricle: Left ventricular ejection fraction, by estimation, is 60 to 65%. The left ventricle has normal function. The left ventricle has no regional wall motion abnormalities. The left ventricular internal cavity size was normal in size. There is no left ventricular hypertrophy. Left ventricular diastolic function could not be evaluated.  Right Ventricle: The right ventricular size is normal. Right ventricular systolic function is normal. Tricuspid regurgitation signal is inadequate for assessing PA pressure.  Left Atrium: Left atrial size was mildly dilated.  Right Atrium: Right atrial size was normal in size.  Pericardium: There is no evidence of pericardial effusion.  Mitral Valve: The mitral valve is normal in structure. Moderate mitral annular calcification. No evidence of mitral valve regurgitation. MV peak gradient, 6.0 mmHg. The mean mitral valve gradient is 1.0 mmHg.  Tricuspid Valve: The tricuspid valve is normal in structure. Tricuspid valve regurgitation is not demonstrated.  Aortic Valve: The aortic valve is normal in structure. There is mild calcification of the aortic valve. Aortic valve regurgitation is not visualized.  Pulmonic Valve: The pulmonic valve was not well visualized. Pulmonic valve regurgitation is not visualized.  Aorta: No significant ascending aortic aneurysm.  Venous: The inferior vena cava is dilated in size with greater than 50% respiratory variability, suggesting right atrial pressure of 8 mmHg.   LEFT  VENTRICLE PLAX 2D LVIDd:         4.40 cm LVIDs:         2.90 cm LV PW:         0.90 cm LV IVS:        0.90 cm LVOT diam:     2.10 cm     3D Volume EF: LV SV:         86          3D EF:  63 % LV SV Index:   55          LV EDV:       78 ml LVOT Area:     3.46 cm    LV ESV:       29 ml LV SV:        49 ml  LV Volumes (MOD) LV vol d, MOD A2C: 61.6 ml LV vol d, MOD A4C: 59.0 ml LV vol s, MOD A2C: 22.4 ml LV vol s, MOD A4C: 18.1 ml LV SV MOD A2C:     39.2 ml LV SV MOD A4C:     59.0 ml LV SV MOD BP:      41.5 ml  RIGHT VENTRICLE            IVC RV S prime:     7.83 cm/s  IVC diam: 2.30 cm TAPSE (M-mode): 2.3 cm  LEFT ATRIUM           Index        RIGHT ATRIUM           Index LA diam:      4.00 cm 2.54 cm/m   RA Area:     12.90 cm LA Vol (A2C): 12.6 ml 8.00 ml/m   RA Volume:   33.50 ml  21.26 ml/m LA Vol (A4C): 50.2 ml 31.86 ml/m AORTIC VALVE LVOT Vmax:   95.10 cm/s LVOT Vmean:  60.600 cm/s LVOT VTI:    0.248 m  AORTA Ao Root diam: 3.30 cm Ao Asc diam:  3.60 cm  MITRAL VALVE MV Area (PHT): 3.12 cm    SHUNTS MV Area VTI:   1.57 cm    Systemic VTI:  0.25 m MV Peak grad:  6.0 mmHg    Systemic Diam: 2.10 cm MV Mean grad:  1.0 mmHg MV Vmax:       1.22 m/s MV Vmean:      50.3 cm/s MV Decel Time: 243 msec MV E velocity: 97.50 cm/s MV A velocity: 99.40 cm/s MV E/A ratio:  0.98  Mary Scientist, physiological signed by Phineas Inches Signature Date/Time: 11/07/2021/12:13:58 PM    Final    MONITORS  LONG TERM MONITOR (3-14 DAYS) 02/04/2022  Narrative 3 Day Zio Monitor  Quality: Fair.  Baseline artifact. Predominant rhythm: atrial fibrillation Average heart rate: 87 bpm Max heart rate: 171 bpm Min heart rate: 43 bpm Pauses >2.5 seconds: none Occasional PVCs   Tiffany C. Oval Linsey, MD, [facility] 02/04/2022 8:22 AM           EKG:  EKG is not ordered today.  Recent Labs: 01/15/2022: ALT 25; Hemoglobin 14.3; Magnesium 2.1; Platelets 284; TSH 1.970 05/28/2022:  BUN 17; Creatinine, Ser 0.70; Potassium 5.1; Sodium 138  Recent Lipid Panel    Component Value Date/Time   CHOL 132 11/07/2021 0158   TRIG 27 11/07/2021 0158   HDL 64 11/07/2021 0158   CHOLHDL 2.1 11/07/2021 0158   VLDL 5 11/07/2021 0158   LDLCALC 63 11/07/2021 0158    Risk Assessment/Calculations:   CHA2DS2-VASc Score = 5   This indicates a 7.2% annual risk of stroke. The patient's score is based upon: CHF History: 0 HTN History: 1 Diabetes History: 0 Stroke History: 0 Vascular Disease History: 1 Age Score: 2 Gender Score: 1     Home Medications   Current Meds  Medication Sig   albuterol (VENTOLIN HFA) 108 (90 Base) MCG/ACT inhaler Inhale 1 puff into the lungs 2 (two) times daily. ProAir  Ascorbic Acid (VITAMIN C) 1000 MG tablet Take 1,000 mg by mouth daily.   aspirin EC 81 MG tablet Take 1 tablet (81 mg total) by mouth daily. Swallow whole.   beclomethasone (QVAR) 80 MCG/ACT inhaler Inhale 1 puff into the lungs 2 (two) times daily.   Biotin 5 MG TABS Take 5 mg by mouth every other day.   BLACK ELDERBERRY PO Take 10 mLs by mouth daily. Liquid   Calcium-Magnesium-Vitamin D (CALCIUM+D3 GRADUAL RELEASE PO) Take by mouth.   Cholecalciferol (VITAMIN D) 50 MCG (2000 UT) CAPS Take 2,000 Units by mouth daily.   clobetasol (OLUX) 0.05 % topical foam Apply 1 application. topically daily as needed (sensitive scalp).   cloNIDine (CATAPRES) 0.1 MG tablet Take 0.1 mg by mouth 3 (three) times daily. Take a forth tablets if systolic goes over [id number]   Coenzyme Q10 (CO Q-10) 200 MG CAPS Take 200 mg by mouth daily.   ELIQUIS 2.5 MG TABS tablet TAKE 1 TABLET BY MOUTH TWICE A DAY   furosemide (LASIX) 20 MG tablet Take 1 tablet as needed for swelling   Homeopathic Products (ARNICA EX) Apply 1 application. topically daily as needed (arthritis). Sore areas   latanoprost (XALATAN) 0.005 % ophthalmic solution Place 1 drop into both eyes at bedtime.   Menthol-Camphor (TIGER BALM NECK & SHOULDER  RUB) 10-11 % CREA Apply 1 application. topically daily as needed (arthritis pain).   Misc Natural Products (ELDERBERRY ZINC/VIT C/IMMUNE MT) Take 50 mg by mouth daily. 90 mg vitamin C 30 mcg vitamin D 7.5 mg Zinc,  15 mg sodium 1 gummy by mouth daily   Multiple Vitamin (MULTIVITAMIN) capsule Take 1 capsule by mouth daily. Centrum  Silver adult 50+   nitroGLYCERIN (NITROSTAT) 0.4 MG SL tablet Place 1 tablet (0.4 mg total) under the tongue every 5 (five) minutes x 3 doses as needed for chest pain.   polyethylene glycol (MIRALAX / GLYCOLAX) 17 g packet Take 17 g by mouth daily.   rosuvastatin (CRESTOR) 10 MG tablet Take 1 tablet (10 mg total) by mouth every other day.   triamcinolone (KENALOG) 0.1 % Apply 1 application. topically 2 (two) times daily as needed (rash, itching).   [DISCONTINUED] metoprolol succinate (TOPROL-XL) 25 MG 24 hr tablet Take 2 tablets (50 mg total) by mouth 2 (two) times daily.     Review of Systems      All other systems reviewed and are otherwise negative except as noted above.  Physical Exam    VS:  BP 132/82   Pulse 98   Ht 5\' 5"  (1.651 m)   Wt 123 lb 12.8 oz (56.2 kg)   BMI 20.60 kg/m  , BMI Body mass index is 20.6 kg/m.  Wt Readings from Last 3 Encounters:  08/28/22 123 lb 12.8 oz (56.2 kg)  05/28/22 122 lb (55.3 kg)  04/27/22 121 lb 11.2 oz (55.2 kg)    GEN: Well nourished, well developed, in no acute distress. HEENT: normal. Neck: Supple, no JVD, carotid bruits, or masses. Cardiac: IRIR, no murmurs, rubs, or gallops. No clubbing, cyanosis.  Radials/PT 2+ and equal bilaterally. Bilateral LE with trace edema.  Respiratory:  Respirations regular and unlabored, clear to auscultation bilaterally. GI: Soft, nontender, nondistended. MS: No deformity or atrophy. Skin: Warm and dry, no rash. Neuro:  Strength and sensation are intact. Psych: Normal affect.  Assessment & Plan    Persistent atrial fibrillation / Hypercoagulable state- Rate controlled  today. She was previously set up for cardioversion which she cancelled  due to other things going on. Only notes palpitations if heart rate >100bpm. Does feel atrial fibrillation makes her more fatigued. Offered cardioversion, she wishes to continue to think about it. She is concerned her Metoprolol is worsening her asthma, causing stomach pain despite taking two 25mg  tablets instead of 50mg  tablet which she did not tolerate. Discussed Metoprolol is cardioselective. Requests to take Metoprolol 25mg  TID which we agreed to. If does not tolerate, consider transition to alternate BB like bisprolol (did not tolerate nebivolol previously)  HTN -  BP reasonably well controlled 132/82 in clinic. Hesitant to add another medications given her multiple prior intolerances as we are adjusting Metoprolol, as above. Discussed to monitor BP at home at least 2 hours after medications and sitting for 5-10 minutes. Asked to bring BP cuff to follow up.   CAD - Known DES to LAD. LHC 11/07/21 due to NSTEMI with 65% stenosis of LAD stent requiring balloon angioplasty and otherwise minimal atherosclerosis. Recommended for DAPT x 12 months. She was later transitioned to Plavix and Eliquis due to atrial fibrillation. She self discontinued her Plavix 2 months ago do to bruising. Instructed to start Aspirin EC 81mg  QD.   LE edema - No edema appreciated on exam. Continue lasix 20mg  PRN. Recommend elevating legs with sitting.   Mild renal artery stenosis - Renal duplex 10/30/21 with bilateral 1-59% stenosis. BP control, as above.      Cardiac Rehabilitation Eligibility Assessment  The patient is ready to start cardiac rehabilitation from a cardiac standpoint.   Disposition: Follow up in 4 month(s) with Loel Dubonnet, NP and in 6-8 months with Skeet Latch, MD or APP.  Signed, Loel Dubonnet, NP 08/29/2022, 8:22 PM [place]

## 2022-08-28 NOTE — Patient Instructions (Addendum)
Medication Instructions:  Your physician has recommended you make the following change in your medication:   CHANGE Toprol to 25mg  three times per day  START Aspirin EC 81mg  daily  STOP Plavix   *If you need a refill on your cardiac medications before your next appointment, please call your pharmacy*  Follow-Up: At [facility], you and your health needs are our priority.  As part of our continuing mission to provide you with exceptional heart care, we have created designated Provider Care Teams.  These Care Teams include your primary Cardiologist (physician) and Advanced Practice Providers (APPs -  Physician Assistants and Nurse Practitioners) who all work together to provide you with the care you need, when you need it.  We recommend signing up for the patient portal called "MyChart".  Sign up information is provided on this After Visit Summary.  MyChart is used to connect with patients for Virtual Visits (Telemedicine).  Patients are able to view lab/test results, encounter notes, upcoming appointments, etc.  Non-urgent messages can be sent to your provider as well.   To learn more about what you can do with MyChart, go to [email or web address].    Your next appointment:   Follow up in 4 months with Laurann Montana, NP   &   Follow up in 6-8 months with Dr. Oval Linsey   Other Instructions We will send you a message in a week to check in on your symptoms in one week as well as your blood pressure/heart rate. If you blood pressure is still above goal or if you note side effects from Metoprolol we can consider switching to an alternate beta blocker.

## 2022-08-29 ENCOUNTER — Encounter (HOSPITAL_BASED_OUTPATIENT_CLINIC_OR_DEPARTMENT_OTHER): Payer: Self-pay | Admitting: Family

## 2022-08-31 DIAGNOSIS — M170 Bilateral primary osteoarthritis of knee: Secondary | ICD-10-CM | POA: Diagnosis not present

## 2022-08-31 DIAGNOSIS — M25662 Stiffness of left knee, not elsewhere classified: Secondary | ICD-10-CM | POA: Diagnosis not present

## 2022-08-31 DIAGNOSIS — M6281 Muscle weakness (generalized): Secondary | ICD-10-CM | POA: Diagnosis not present

## 2022-08-31 DIAGNOSIS — M25661 Stiffness of right knee, not elsewhere classified: Secondary | ICD-10-CM | POA: Diagnosis not present

## 2022-09-02 DIAGNOSIS — M6281 Muscle weakness (generalized): Secondary | ICD-10-CM | POA: Diagnosis not present

## 2022-09-02 DIAGNOSIS — M25661 Stiffness of right knee, not elsewhere classified: Secondary | ICD-10-CM | POA: Diagnosis not present

## 2022-09-02 DIAGNOSIS — M25662 Stiffness of left knee, not elsewhere classified: Secondary | ICD-10-CM | POA: Diagnosis not present

## 2022-09-02 DIAGNOSIS — M170 Bilateral primary osteoarthritis of knee: Secondary | ICD-10-CM | POA: Diagnosis not present

## 2022-09-04 ENCOUNTER — Encounter (HOSPITAL_BASED_OUTPATIENT_CLINIC_OR_DEPARTMENT_OTHER): Payer: Self-pay

## 2022-09-08 DIAGNOSIS — M25661 Stiffness of right knee, not elsewhere classified: Secondary | ICD-10-CM | POA: Diagnosis not present

## 2022-09-08 DIAGNOSIS — M6281 Muscle weakness (generalized): Secondary | ICD-10-CM | POA: Diagnosis not present

## 2022-09-08 DIAGNOSIS — M170 Bilateral primary osteoarthritis of knee: Secondary | ICD-10-CM | POA: Diagnosis not present

## 2022-09-08 DIAGNOSIS — M25662 Stiffness of left knee, not elsewhere classified: Secondary | ICD-10-CM | POA: Diagnosis not present

## 2022-09-10 DIAGNOSIS — M6281 Muscle weakness (generalized): Secondary | ICD-10-CM | POA: Diagnosis not present

## 2022-09-10 DIAGNOSIS — M170 Bilateral primary osteoarthritis of knee: Secondary | ICD-10-CM | POA: Diagnosis not present

## 2022-09-10 DIAGNOSIS — M25662 Stiffness of left knee, not elsewhere classified: Secondary | ICD-10-CM | POA: Diagnosis not present

## 2022-09-10 DIAGNOSIS — M25661 Stiffness of right knee, not elsewhere classified: Secondary | ICD-10-CM | POA: Diagnosis not present

## 2022-09-11 ENCOUNTER — Other Ambulatory Visit (HOSPITAL_BASED_OUTPATIENT_CLINIC_OR_DEPARTMENT_OTHER): Payer: Self-pay | Admitting: General Practice

## 2022-09-17 DIAGNOSIS — M170 Bilateral primary osteoarthritis of knee: Secondary | ICD-10-CM | POA: Diagnosis not present

## 2022-09-17 DIAGNOSIS — M25662 Stiffness of left knee, not elsewhere classified: Secondary | ICD-10-CM | POA: Diagnosis not present

## 2022-09-17 DIAGNOSIS — M6281 Muscle weakness (generalized): Secondary | ICD-10-CM | POA: Diagnosis not present

## 2022-09-17 DIAGNOSIS — M25661 Stiffness of right knee, not elsewhere classified: Secondary | ICD-10-CM | POA: Diagnosis not present

## 2022-09-30 DIAGNOSIS — M25661 Stiffness of right knee, not elsewhere classified: Secondary | ICD-10-CM | POA: Diagnosis not present

## 2022-09-30 DIAGNOSIS — M25662 Stiffness of left knee, not elsewhere classified: Secondary | ICD-10-CM | POA: Diagnosis not present

## 2022-09-30 DIAGNOSIS — M170 Bilateral primary osteoarthritis of knee: Secondary | ICD-10-CM | POA: Diagnosis not present

## 2022-09-30 DIAGNOSIS — M6281 Muscle weakness (generalized): Secondary | ICD-10-CM | POA: Diagnosis not present

## 2022-10-01 DIAGNOSIS — L649 Androgenic alopecia, unspecified: Secondary | ICD-10-CM | POA: Diagnosis not present

## 2022-10-05 ENCOUNTER — Ambulatory Visit: Payer: Medicare Other | Admitting: Podiatry

## 2022-10-05 DIAGNOSIS — M25661 Stiffness of right knee, not elsewhere classified: Secondary | ICD-10-CM | POA: Diagnosis not present

## 2022-10-05 DIAGNOSIS — M25662 Stiffness of left knee, not elsewhere classified: Secondary | ICD-10-CM | POA: Diagnosis not present

## 2022-10-05 DIAGNOSIS — M170 Bilateral primary osteoarthritis of knee: Secondary | ICD-10-CM | POA: Diagnosis not present

## 2022-10-05 DIAGNOSIS — M6281 Muscle weakness (generalized): Secondary | ICD-10-CM | POA: Diagnosis not present

## 2022-10-12 DIAGNOSIS — M25661 Stiffness of right knee, not elsewhere classified: Secondary | ICD-10-CM | POA: Diagnosis not present

## 2022-10-12 DIAGNOSIS — M6281 Muscle weakness (generalized): Secondary | ICD-10-CM | POA: Diagnosis not present

## 2022-10-12 DIAGNOSIS — M170 Bilateral primary osteoarthritis of knee: Secondary | ICD-10-CM | POA: Diagnosis not present

## 2022-10-12 DIAGNOSIS — M25662 Stiffness of left knee, not elsewhere classified: Secondary | ICD-10-CM | POA: Diagnosis not present

## 2022-10-13 ENCOUNTER — Telehealth (HOSPITAL_BASED_OUTPATIENT_CLINIC_OR_DEPARTMENT_OTHER): Payer: Self-pay | Admitting: Cardiovascular Disease

## 2022-10-13 NOTE — Telephone Encounter (Signed)
Left message for patient to call and schedule 6- 8 month follow up with Dr. Duke Salvia

## 2022-10-14 DIAGNOSIS — M1712 Unilateral primary osteoarthritis, left knee: Secondary | ICD-10-CM | POA: Diagnosis not present

## 2022-10-16 DIAGNOSIS — M170 Bilateral primary osteoarthritis of knee: Secondary | ICD-10-CM | POA: Diagnosis not present

## 2022-10-16 DIAGNOSIS — M6281 Muscle weakness (generalized): Secondary | ICD-10-CM | POA: Diagnosis not present

## 2022-10-16 DIAGNOSIS — M25662 Stiffness of left knee, not elsewhere classified: Secondary | ICD-10-CM | POA: Diagnosis not present

## 2022-10-16 DIAGNOSIS — M25661 Stiffness of right knee, not elsewhere classified: Secondary | ICD-10-CM | POA: Diagnosis not present

## 2022-10-19 ENCOUNTER — Ambulatory Visit (INDEPENDENT_AMBULATORY_CARE_PROVIDER_SITE_OTHER): Payer: Medicare Other | Admitting: Podiatry

## 2022-10-19 DIAGNOSIS — M79674 Pain in right toe(s): Secondary | ICD-10-CM | POA: Diagnosis not present

## 2022-10-19 DIAGNOSIS — M79675 Pain in left toe(s): Secondary | ICD-10-CM

## 2022-10-19 DIAGNOSIS — B351 Tinea unguium: Secondary | ICD-10-CM | POA: Diagnosis not present

## 2022-10-19 NOTE — Progress Notes (Signed)
       Subjective:  Patient ID: Kendra [name], female    DOB: Jul 08, 1935,  MRN: [id number]   Kendra [name] presents to clinic today for:  Chief Complaint  Patient presents with   Nail Problem    RFC/nail trim  . Patient notes nails are thick, discolored, elongated and painful in shoegear when trying to ambulate.    PCP is Kendra [name], MD.  Last seen 08/26/22.  Allergies  Allergen Reactions   Chlorthalidone Other (See Comments)    Hyponatremia   Other     Other reaction(s): Respiratory Distress   Amlodipine     "shivers"    Ciprofloxacin Other (See Comments)    UNK reaction   Dog Epithelium (Canis Lupus Familiaris) Other (See Comments)    Cat and dog dander   Irbesartan Other (See Comments)   Latex Other (See Comments)    Irritates her skin   Mixed Feathers    Nebivolol Hcl     Other reaction(s): cant remember   Spironolactone     Other Reaction(s): Unknown   Statins     Unable to tolerate statins w the exception of crestor.   Sulfa Antibiotics Other (See Comments)    "didnt feel good"   Sulfamethoxazole Other (See Comments)    Makes her feel bad   Ticagrelor Other (See Comments)   Zetia [Ezetimibe] Other (See Comments)    "makes me constipated"   Penicillins Rash    Review of Systems: Negative except as noted in the HPI.  Objective:  Kendra [name] is a pleasant 87 y.o. female in NAD. AAO x 3.  Vascular Examination: Capillary refill time is 3-5 seconds to toes bilateral. Palpable pedal pulses b/l LE. Digital hair present b/l.  Mild leg edema bilateral.  Skin temperature gradient WNL b/l. No cyanosis or clubbing noted b/l.   Dermatological Examination:  No open wounds. No interdigital macerations b/l. Toenails x10 are 3mm thick, discolored, dystrophic with subungual debris. There is pain with compression of the nail plates.  They are elongated x10.  She has some hemosiderin deposition in the skin of the legs, middle to lower one third aspect.  She  has fragile skin on the legs bilateral.  Neurological Examination: Protective sensation intact bilateral LE. Vibratory sensation intact bilateral LE.  Musculoskeletal Examination: Muscle strength 5/5 to all LE muscle groups b/l.   Assessment/Plan: 1. Pain due to onychomycosis of toenails of both feet     The mycotic toenails were sharply debrided x10 with sterile nail nippers and a power debriding burr to decrease bulk/thickness and length.  Discussed topical treatments for the fungal toenails.  Recommended formula 7 solution twice daily to the affected toenails.  Follow-up in 3 months.   Kendra [name], DPM, FACFAS [facility]     [address] [place], [place] [address]                Office [phone]  Fax [phone]

## 2022-10-20 DIAGNOSIS — M25662 Stiffness of left knee, not elsewhere classified: Secondary | ICD-10-CM | POA: Diagnosis not present

## 2022-10-20 DIAGNOSIS — M25661 Stiffness of right knee, not elsewhere classified: Secondary | ICD-10-CM | POA: Diagnosis not present

## 2022-10-20 DIAGNOSIS — M6281 Muscle weakness (generalized): Secondary | ICD-10-CM | POA: Diagnosis not present

## 2022-10-20 DIAGNOSIS — M170 Bilateral primary osteoarthritis of knee: Secondary | ICD-10-CM | POA: Diagnosis not present

## 2022-10-21 ENCOUNTER — Other Ambulatory Visit: Payer: Self-pay | Admitting: Student

## 2022-10-21 DIAGNOSIS — M1711 Unilateral primary osteoarthritis, right knee: Secondary | ICD-10-CM | POA: Diagnosis not present

## 2022-10-21 DIAGNOSIS — M170 Bilateral primary osteoarthritis of knee: Secondary | ICD-10-CM | POA: Diagnosis not present

## 2022-10-21 DIAGNOSIS — M1712 Unilateral primary osteoarthritis, left knee: Secondary | ICD-10-CM | POA: Diagnosis not present

## 2022-10-21 NOTE — Telephone Encounter (Signed)
Rx request sent to pharmacy.  

## 2022-10-22 DIAGNOSIS — M810 Age-related osteoporosis without current pathological fracture: Secondary | ICD-10-CM | POA: Diagnosis not present

## 2022-10-22 DIAGNOSIS — I4819 Other persistent atrial fibrillation: Secondary | ICD-10-CM | POA: Diagnosis not present

## 2022-10-22 DIAGNOSIS — I10 Essential (primary) hypertension: Secondary | ICD-10-CM | POA: Diagnosis not present

## 2022-10-22 DIAGNOSIS — E782 Mixed hyperlipidemia: Secondary | ICD-10-CM | POA: Diagnosis not present

## 2022-10-22 DIAGNOSIS — J4530 Mild persistent asthma, uncomplicated: Secondary | ICD-10-CM | POA: Diagnosis not present

## 2022-10-22 DIAGNOSIS — I25118 Atherosclerotic heart disease of native coronary artery with other forms of angina pectoris: Secondary | ICD-10-CM | POA: Diagnosis not present

## 2022-10-23 DIAGNOSIS — M25662 Stiffness of left knee, not elsewhere classified: Secondary | ICD-10-CM | POA: Diagnosis not present

## 2022-10-23 DIAGNOSIS — M170 Bilateral primary osteoarthritis of knee: Secondary | ICD-10-CM | POA: Diagnosis not present

## 2022-10-23 DIAGNOSIS — M25661 Stiffness of right knee, not elsewhere classified: Secondary | ICD-10-CM | POA: Diagnosis not present

## 2022-10-23 DIAGNOSIS — M6281 Muscle weakness (generalized): Secondary | ICD-10-CM | POA: Diagnosis not present

## 2022-10-26 DIAGNOSIS — M6281 Muscle weakness (generalized): Secondary | ICD-10-CM | POA: Diagnosis not present

## 2022-10-26 DIAGNOSIS — M25662 Stiffness of left knee, not elsewhere classified: Secondary | ICD-10-CM | POA: Diagnosis not present

## 2022-10-26 DIAGNOSIS — M25661 Stiffness of right knee, not elsewhere classified: Secondary | ICD-10-CM | POA: Diagnosis not present

## 2022-10-26 DIAGNOSIS — M170 Bilateral primary osteoarthritis of knee: Secondary | ICD-10-CM | POA: Diagnosis not present

## 2022-10-29 DIAGNOSIS — M6281 Muscle weakness (generalized): Secondary | ICD-10-CM | POA: Diagnosis not present

## 2022-10-29 DIAGNOSIS — M25662 Stiffness of left knee, not elsewhere classified: Secondary | ICD-10-CM | POA: Diagnosis not present

## 2022-10-29 DIAGNOSIS — M25661 Stiffness of right knee, not elsewhere classified: Secondary | ICD-10-CM | POA: Diagnosis not present

## 2022-10-29 DIAGNOSIS — M170 Bilateral primary osteoarthritis of knee: Secondary | ICD-10-CM | POA: Diagnosis not present

## 2022-11-04 DIAGNOSIS — M25661 Stiffness of right knee, not elsewhere classified: Secondary | ICD-10-CM | POA: Diagnosis not present

## 2022-11-04 DIAGNOSIS — M25662 Stiffness of left knee, not elsewhere classified: Secondary | ICD-10-CM | POA: Diagnosis not present

## 2022-11-04 DIAGNOSIS — M6281 Muscle weakness (generalized): Secondary | ICD-10-CM | POA: Diagnosis not present

## 2022-11-04 DIAGNOSIS — M170 Bilateral primary osteoarthritis of knee: Secondary | ICD-10-CM | POA: Diagnosis not present

## 2022-11-06 DIAGNOSIS — M170 Bilateral primary osteoarthritis of knee: Secondary | ICD-10-CM | POA: Diagnosis not present

## 2022-11-06 DIAGNOSIS — M25661 Stiffness of right knee, not elsewhere classified: Secondary | ICD-10-CM | POA: Diagnosis not present

## 2022-11-06 DIAGNOSIS — M25662 Stiffness of left knee, not elsewhere classified: Secondary | ICD-10-CM | POA: Diagnosis not present

## 2022-11-06 DIAGNOSIS — M6281 Muscle weakness (generalized): Secondary | ICD-10-CM | POA: Diagnosis not present

## 2022-11-09 DIAGNOSIS — M170 Bilateral primary osteoarthritis of knee: Secondary | ICD-10-CM | POA: Diagnosis not present

## 2022-11-09 DIAGNOSIS — M25661 Stiffness of right knee, not elsewhere classified: Secondary | ICD-10-CM | POA: Diagnosis not present

## 2022-11-09 DIAGNOSIS — M25662 Stiffness of left knee, not elsewhere classified: Secondary | ICD-10-CM | POA: Diagnosis not present

## 2022-11-09 DIAGNOSIS — M6281 Muscle weakness (generalized): Secondary | ICD-10-CM | POA: Diagnosis not present

## 2022-11-11 DIAGNOSIS — M25661 Stiffness of right knee, not elsewhere classified: Secondary | ICD-10-CM | POA: Diagnosis not present

## 2022-11-11 DIAGNOSIS — M170 Bilateral primary osteoarthritis of knee: Secondary | ICD-10-CM | POA: Diagnosis not present

## 2022-11-11 DIAGNOSIS — M6281 Muscle weakness (generalized): Secondary | ICD-10-CM | POA: Diagnosis not present

## 2022-11-11 DIAGNOSIS — M25662 Stiffness of left knee, not elsewhere classified: Secondary | ICD-10-CM | POA: Diagnosis not present

## 2022-11-13 DIAGNOSIS — I4819 Other persistent atrial fibrillation: Secondary | ICD-10-CM | POA: Diagnosis not present

## 2022-11-13 DIAGNOSIS — R7303 Prediabetes: Secondary | ICD-10-CM | POA: Diagnosis not present

## 2022-11-13 DIAGNOSIS — I10 Essential (primary) hypertension: Secondary | ICD-10-CM | POA: Diagnosis not present

## 2022-11-13 DIAGNOSIS — M2550 Pain in unspecified joint: Secondary | ICD-10-CM | POA: Diagnosis not present

## 2022-11-13 DIAGNOSIS — E782 Mixed hyperlipidemia: Secondary | ICD-10-CM | POA: Diagnosis not present

## 2022-11-13 DIAGNOSIS — D6869 Other thrombophilia: Secondary | ICD-10-CM | POA: Diagnosis not present

## 2022-11-13 DIAGNOSIS — J4530 Mild persistent asthma, uncomplicated: Secondary | ICD-10-CM | POA: Diagnosis not present

## 2022-11-13 DIAGNOSIS — I700 Atherosclerosis of aorta: Secondary | ICD-10-CM | POA: Diagnosis not present

## 2022-11-17 DIAGNOSIS — M25662 Stiffness of left knee, not elsewhere classified: Secondary | ICD-10-CM | POA: Diagnosis not present

## 2022-11-17 DIAGNOSIS — M170 Bilateral primary osteoarthritis of knee: Secondary | ICD-10-CM | POA: Diagnosis not present

## 2022-11-17 DIAGNOSIS — M6281 Muscle weakness (generalized): Secondary | ICD-10-CM | POA: Diagnosis not present

## 2022-11-17 DIAGNOSIS — M25661 Stiffness of right knee, not elsewhere classified: Secondary | ICD-10-CM | POA: Diagnosis not present

## 2022-11-19 ENCOUNTER — Ambulatory Visit (INDEPENDENT_AMBULATORY_CARE_PROVIDER_SITE_OTHER): Payer: Medicare Other

## 2022-11-19 DIAGNOSIS — I6522 Occlusion and stenosis of left carotid artery: Secondary | ICD-10-CM

## 2022-12-01 DIAGNOSIS — M170 Bilateral primary osteoarthritis of knee: Secondary | ICD-10-CM | POA: Diagnosis not present

## 2022-12-01 DIAGNOSIS — M6281 Muscle weakness (generalized): Secondary | ICD-10-CM | POA: Diagnosis not present

## 2022-12-01 DIAGNOSIS — M25661 Stiffness of right knee, not elsewhere classified: Secondary | ICD-10-CM | POA: Diagnosis not present

## 2022-12-01 DIAGNOSIS — M25662 Stiffness of left knee, not elsewhere classified: Secondary | ICD-10-CM | POA: Diagnosis not present

## 2022-12-04 DIAGNOSIS — M6281 Muscle weakness (generalized): Secondary | ICD-10-CM | POA: Diagnosis not present

## 2022-12-04 DIAGNOSIS — M25661 Stiffness of right knee, not elsewhere classified: Secondary | ICD-10-CM | POA: Diagnosis not present

## 2022-12-04 DIAGNOSIS — M25662 Stiffness of left knee, not elsewhere classified: Secondary | ICD-10-CM | POA: Diagnosis not present

## 2022-12-04 DIAGNOSIS — M170 Bilateral primary osteoarthritis of knee: Secondary | ICD-10-CM | POA: Diagnosis not present

## 2022-12-08 DIAGNOSIS — S39012A Strain of muscle, fascia and tendon of lower back, initial encounter: Secondary | ICD-10-CM | POA: Diagnosis not present

## 2022-12-29 ENCOUNTER — Ambulatory Visit (HOSPITAL_BASED_OUTPATIENT_CLINIC_OR_DEPARTMENT_OTHER): Payer: Medicare Other | Admitting: Family

## 2022-12-30 DIAGNOSIS — H401232 Low-tension glaucoma, bilateral, moderate stage: Secondary | ICD-10-CM | POA: Diagnosis not present

## 2022-12-30 DIAGNOSIS — H04123 Dry eye syndrome of bilateral lacrimal glands: Secondary | ICD-10-CM | POA: Diagnosis not present

## 2022-12-31 ENCOUNTER — Encounter (HOSPITAL_BASED_OUTPATIENT_CLINIC_OR_DEPARTMENT_OTHER): Payer: Self-pay | Admitting: Family

## 2022-12-31 ENCOUNTER — Ambulatory Visit (INDEPENDENT_AMBULATORY_CARE_PROVIDER_SITE_OTHER): Payer: Medicare Other | Admitting: Family

## 2022-12-31 VITALS — BP 130/74 | HR 84 | Ht 65.0 in | Wt 121.6 lb

## 2022-12-31 DIAGNOSIS — I25118 Atherosclerotic heart disease of native coronary artery with other forms of angina pectoris: Secondary | ICD-10-CM

## 2022-12-31 DIAGNOSIS — E785 Hyperlipidemia, unspecified: Secondary | ICD-10-CM | POA: Diagnosis not present

## 2022-12-31 DIAGNOSIS — I4819 Other persistent atrial fibrillation: Secondary | ICD-10-CM | POA: Diagnosis not present

## 2022-12-31 DIAGNOSIS — I701 Atherosclerosis of renal artery: Secondary | ICD-10-CM

## 2022-12-31 DIAGNOSIS — D6859 Other primary thrombophilia: Secondary | ICD-10-CM

## 2022-12-31 DIAGNOSIS — I10 Essential (primary) hypertension: Secondary | ICD-10-CM | POA: Diagnosis not present

## 2022-12-31 MED ORDER — APIXABAN 2.5 MG PO TABS
2.5000 mg | ORAL_TABLET | Freq: Two times a day (BID) | ORAL | 6 refills | Status: DC
Start: 2022-12-31 — End: 2023-09-28

## 2022-12-31 MED ORDER — CLONIDINE HCL 0.1 MG PO TABS
0.1000 mg | ORAL_TABLET | Freq: Three times a day (TID) | ORAL | 3 refills | Status: DC
Start: 2022-12-31 — End: 2024-01-19

## 2022-12-31 NOTE — Patient Instructions (Signed)
Medication Instructions:  Continue your current medications.   *If you need a refill on your cardiac medications before your next appointment, please call your pharmacy*  Follow-Up: At [facility], you and your health needs are our priority.  As part of our continuing mission to provide you with exceptional heart care, we have created designated Provider Care Teams.  These Care Teams include your primary Cardiologist (physician) and Advanced Practice Providers (APPs -  Physician Assistants and Nurse Practitioners) who all work together to provide you with the care you need, when you need it.  We recommend signing up for the patient portal called "MyChart".  Sign up information is provided on this After Visit Summary.  MyChart is used to connect with patients for Virtual Visits (Telemedicine).  Patients are able to view lab/test results, encounter notes, upcoming appointments, etc.  Non-urgent messages can be sent to your provider as well.   To learn more about what you can do with MyChart, go to [email or web address].    Your next appointment:   As scheduled with Dr. Duke Salvia in October

## 2022-12-31 NOTE — Progress Notes (Signed)
Cardiology Office Note:  .   Date:  12/31/2022  ID:  Kendra [name], DOB [birth year], MRN [id number] PCP: Deatra [name], MD  [facility] Providers Cardiologist:  Chilton Si, MD    History of Present Illness: .   Kendra [name] is a 87 y.o. female  with a hx of atrial fibrillation, hypertension, asthma, CAD s/p LAD PCI, carotic stenosis, HLD, DJD, renal artery stenosis last seen 05/28/22.   Blood pressure management has been limited by medication intolerances. Renal artery dopplers 10/2020 >50% stenosis bilaterally. CTA abd/pelvis 04/2021 50% prox L renal artery stenosis and 2 right renal arteries. There was 40% prox stenosis in larger argery. Mild plaque in SMA and some stenosis in IMA. 50% bilateral common iliac artery stenosis.    Established with [facility] clinic with BP 214/76. Losartan switched to Irbesartan. Hydralazine stopped. Started on Spironolactone. Clonidine adjusted.    Admitted 11/2021 for NSTEMI with LHC showing moderate ISR of prior LAD stent treated with balloon angioplasty. Transitioned from Brilinta to Plavix due to cost. She had episode of atrial fibrillation and was seen by atrial fib clinic recommended for DCCV but she cancelled the procedure.    Seen 03/2022 feeling overall well. Heart rate at home in the 80s. BP not at goal. She declined cardioversion. Lisinopril was stopped as she reported not tolerating. She was started on Diltiazem 180mg  QD.    Seen 04/2022 noting she did not tolerate Diltiazem due to swelling. She was agreeable to Lasix 20mg  QD x 3 days for swelling. She was set up for cardioversion but subsequently cancelled.   Seen 05/2022 with BP elevated in clinic but politely declined changes as reported controlled at home. Last seen 08/28/22 at which time Metoprolol 25mg  changed to TID per her preference.    Pleasant lady who presents today for follow up. She is very involved in her synagogue. BP elevated in clinic but controlled by home  readings, she did not bring her BP cuff as requested. Feels breathing, BP, heart rate are well controlled on Toprol 25mg  TID. No new or worsening chest pain, dyspnea. Not requiring PRN Lasix. She is overall unaware of her atrial fibrillation.   ROS: Please see the history of present illness.    All other systems reviewed and are negative.   Studies Reviewed: [name]   EKG Interpretation Date/Time:  Thursday December 31 2022 14:24:31 EDT Ventricular Rate:  84 PR Interval:    QRS Duration:  146 QT Interval:  398 QTC Calculation: 470 R Axis:   83  Text Interpretation: Atrial fibrillation  rate controlled Right bundle branch block  TWI lead III Confirmed by Gillian Shields ([id number]) on 12/31/2022 7:57:24 PM    Cardiac Studies & Procedures   CARDIAC CATHETERIZATION  CARDIAC CATHETERIZATION 11/07/2021  Narrative   Mid LAD lesion is 65% stenosed.   Scoring balloon angioplasty was performed.   Post intervention, there is a 0% residual stenosis.   LV end diastolic pressure is low.  1.  Moderate in-stent restenosis with an RFR of 0.88 with minimal disease elsewhere.  This was treated with OCT guided balloon angioplasty with a scoring balloon. 2.  LVEDP of 13 mmHg.  Recommendation: Dual antiplatelet therapy for 1 year.  Findings Coronary Findings Diagnostic  Dominance: Right  Left Anterior Descending Mid LAD lesion is 65% stenosed. The lesion was previously treated .  Left Circumflex The vessel exhibits minimal luminal irregularities.  Right Coronary Artery The vessel exhibits minimal luminal irregularities.  Intervention  Mid LAD  lesion Angioplasty Scoring balloon angioplasty was performed. Post-Intervention Lesion Assessment The intervention was successful. Pre-interventional TIMI flow is 3. Post-intervention TIMI flow is 3. No complications occurred at this lesion. There is a 0% residual stenosis post intervention.     ECHOCARDIOGRAM  ECHOCARDIOGRAM COMPLETE  11/07/2021  Narrative ECHOCARDIOGRAM REPORT    Patient Name:   Kendra [name] Date of Exam: 11/07/2021 Medical Rec #:  [id number]           Height:       65.0 in Accession #:    [id number]          Weight:       117.1 lb Date of Birth:  04/11/1936           BSA:          1.575 m Patient Age:    85 years            BP:           176/67 mmHg Patient Gender: F                   HR:           39 bpm. Exam Location:  Inpatient  Procedure: 2D Echo, 3D Echo, Cardiac Doppler and Color Doppler  Indications:    R07.9* Chest pain, unspecified  History:        Patient has prior history of Echocardiogram examinations, most recent 04/10/2020. Previous Myocardial Infarction and CAD, Abnormal ECG, Signs/Symptoms:Edema; Risk Factors:Hypertension and Dyslipidemia.  Sonographer:    Sheralyn Boatman RDCS Referring Phys: [id number] Corrin Parker   Sonographer Comments: Technically difficult study due to poor echo windows. IMPRESSIONS   1. Left ventricular ejection fraction, by estimation, is 60 to 65%. The left ventricle has normal function. The left ventricle has no regional wall motion abnormalities. Left ventricular diastolic function could not be evaluated. 2. Right ventricular systolic function is normal. The right ventricular size is normal. Tricuspid regurgitation signal is inadequate for assessing PA pressure. 3. Left atrial size was mildly dilated. 4. The mitral valve is normal in structure. No evidence of mitral valve regurgitation. Moderate mitral annular calcification. 5. The aortic valve is normal in structure. There is mild calcification of the aortic valve. Aortic valve regurgitation is not visualized. 6. Aortic no significant ascending aortic aneurysm. 7. The inferior vena cava is dilated in size with >50% respiratory variability, suggesting right atrial pressure of 8 mmHg.  Comparison(s): No significant change from prior study.  FINDINGS Left Ventricle: Left ventricular ejection  fraction, by estimation, is 60 to 65%. The left ventricle has normal function. The left ventricle has no regional wall motion abnormalities. The left ventricular internal cavity size was normal in size. There is no left ventricular hypertrophy. Left ventricular diastolic function could not be evaluated.  Right Ventricle: The right ventricular size is normal. Right ventricular systolic function is normal. Tricuspid regurgitation signal is inadequate for assessing PA pressure.  Left Atrium: Left atrial size was mildly dilated.  Right Atrium: Right atrial size was normal in size.  Pericardium: There is no evidence of pericardial effusion.  Mitral Valve: The mitral valve is normal in structure. Moderate mitral annular calcification. No evidence of mitral valve regurgitation. MV peak gradient, 6.0 mmHg. The mean mitral valve gradient is 1.0 mmHg.  Tricuspid Valve: The tricuspid valve is normal in structure. Tricuspid valve regurgitation is not demonstrated.  Aortic Valve: The aortic valve is normal in structure. There is mild calcification of the aortic  valve. Aortic valve regurgitation is not visualized.  Pulmonic Valve: The pulmonic valve was not well visualized. Pulmonic valve regurgitation is not visualized.  Aorta: No significant ascending aortic aneurysm.  Venous: The inferior vena cava is dilated in size with greater than 50% respiratory variability, suggesting right atrial pressure of 8 mmHg.   LEFT VENTRICLE PLAX 2D LVIDd:         4.40 cm LVIDs:         2.90 cm LV PW:         0.90 cm LV IVS:        0.90 cm LVOT diam:     2.10 cm     3D Volume EF: LV SV:         86          3D EF:        63 % LV SV Index:   55          LV EDV:       78 ml LVOT Area:     3.46 cm    LV ESV:       29 ml LV SV:        49 ml  LV Volumes (MOD) LV vol d, MOD A2C: 61.6 ml LV vol d, MOD A4C: 59.0 ml LV vol s, MOD A2C: 22.4 ml LV vol s, MOD A4C: 18.1 ml LV SV MOD A2C:     39.2 ml LV SV MOD A4C:      59.0 ml LV SV MOD BP:      41.5 ml  RIGHT VENTRICLE            IVC RV S prime:     7.83 cm/s  IVC diam: 2.30 cm TAPSE (M-mode): 2.3 cm  LEFT ATRIUM           Index        RIGHT ATRIUM           Index LA diam:      4.00 cm 2.54 cm/m   RA Area:     12.90 cm LA Vol (A2C): 12.6 ml 8.00 ml/m   RA Volume:   33.50 ml  21.26 ml/m LA Vol (A4C): 50.2 ml 31.86 ml/m AORTIC VALVE LVOT Vmax:   95.10 cm/s LVOT Vmean:  60.600 cm/s LVOT VTI:    0.248 m  AORTA Ao Root diam: 3.30 cm Ao Asc diam:  3.60 cm  MITRAL VALVE MV Area (PHT): 3.12 cm    SHUNTS MV Area VTI:   1.57 cm    Systemic VTI:  0.25 m MV Peak grad:  6.0 mmHg    Systemic Diam: 2.10 cm MV Mean grad:  1.0 mmHg MV Vmax:       1.22 m/s MV Vmean:      50.3 cm/s MV Decel Time: 243 msec MV E velocity: 97.50 cm/s MV A velocity: 99.40 cm/s MV E/A ratio:  0.98  [place] signed by Carolan Clines Signature Date/Time: 11/07/2021/12:13:58 PM    Final    MONITORS  LONG TERM MONITOR (3-14 DAYS) 12/30/2021  Narrative 3 Day Zio Monitor  Quality: Fair.  Baseline artifact. Predominant rhythm: atrial fibrillation Average heart rate: 87 bpm Max heart rate: 171 bpm Min heart rate: 43 bpm Pauses >2.5 seconds: none Occasional PVCs   Tiffany C. Duke Salvia, MD, [facility] 02/04/2022 8:22 AM           Risk Assessment/Calculations:  Physical Exam:   VS:  BP 130/74 Comment: home BP  Pulse 84   Ht 5\' 5"  (1.651 m)   Wt 121 lb 9.6 oz (55.2 kg)   BMI 20.24 kg/m    Wt Readings from Last 3 Encounters:  12/31/22 121 lb 9.6 oz (55.2 kg)  08/28/22 123 lb 12.8 oz (56.2 kg)  05/28/22 122 lb (55.3 kg)    GEN: Well nourished, well developed in no acute distress NECK: No JVD; No carotid bruits CARDIAC: IRIR, no murmurs, rubs, gallops RESPIRATORY:  Clear to auscultation without rales, wheezing or rhonchi  ABDOMEN: Soft, non-tender, non-distended EXTREMITIES:  No edema; No deformity   ASSESSMENT AND PLAN: .     Persistent atrial fibrillation / Hypercoagulable state- Rate controlled today. Has previously declined cardioversion due to wish to consider to think about it. She is overall asymptomatic regarding atrial fib. If cardioversion to be pursued, consider echo prior to reassess atria size. Continue Metoprolol 25mg  TID per her preference. Continue Eliquis 2.5mg  BID (reduced dose due to age, body habitus). Previously did not tolerate Nebivolol.    HTN -  BP reasonably well controlled 132/82 in clinic. Hesitant to add another medications given her multiple prior intolerances as we are adjusting Metoprolol, as above. Discussed to monitor BP at home at least 2 hours after medications and sitting for 5-10 minutes. Asked to bring BP cuff to follow up.    CAD - Known DES to LAD. LHC 11/07/21 due to NSTEMI with 65% stenosis of LAD stent requiring balloon angioplasty and otherwise minimal atherosclerosis. Continue Aspirin, Rosuvastatin. Recommend aiming for 150 minutes of moderate intensity activity per week and following a heart healthy diet.    RBBB - New finding on EKG today. Asymptomatic no lightheadedness, dizziness. New new dyspnea nor chest pain. Likely age related. No indication for further workup at this time.    LE edema - No edema appreciated on exam. Not requiring her PRN Lasix often. Continue lasix 20mg  PRN. Recommend elevating legs with sitting.    Mild renal artery stenosis - Renal duplex 10/30/21 with bilateral 1-59% stenosis. BP control, as above. Repeat duplex as clinically indicated as was stable 2022 and 2023. Continue Rosuvastatin.            Dispo: follow up as scheduled  Signed, Alver Sorrow, NP

## 2023-01-06 DIAGNOSIS — L814 Other melanin hyperpigmentation: Secondary | ICD-10-CM | POA: Diagnosis not present

## 2023-01-06 DIAGNOSIS — L578 Other skin changes due to chronic exposure to nonionizing radiation: Secondary | ICD-10-CM | POA: Diagnosis not present

## 2023-01-06 DIAGNOSIS — L821 Other seborrheic keratosis: Secondary | ICD-10-CM | POA: Diagnosis not present

## 2023-01-06 DIAGNOSIS — D1801 Hemangioma of skin and subcutaneous tissue: Secondary | ICD-10-CM | POA: Diagnosis not present

## 2023-01-06 DIAGNOSIS — D229 Melanocytic nevi, unspecified: Secondary | ICD-10-CM | POA: Diagnosis not present

## 2023-01-06 DIAGNOSIS — L603 Nail dystrophy: Secondary | ICD-10-CM | POA: Diagnosis not present

## 2023-01-06 DIAGNOSIS — D2230 Melanocytic nevi of unspecified part of face: Secondary | ICD-10-CM | POA: Diagnosis not present

## 2023-01-14 DIAGNOSIS — H6123 Impacted cerumen, bilateral: Secondary | ICD-10-CM | POA: Diagnosis not present

## 2023-01-25 ENCOUNTER — Ambulatory Visit (INDEPENDENT_AMBULATORY_CARE_PROVIDER_SITE_OTHER): Payer: Medicare Other | Admitting: Podiatry

## 2023-01-25 DIAGNOSIS — M79674 Pain in right toe(s): Secondary | ICD-10-CM | POA: Diagnosis not present

## 2023-01-25 DIAGNOSIS — M79675 Pain in left toe(s): Secondary | ICD-10-CM | POA: Diagnosis not present

## 2023-01-25 DIAGNOSIS — B351 Tinea unguium: Secondary | ICD-10-CM

## 2023-01-25 DIAGNOSIS — M2062 Acquired deformities of toe(s), unspecified, left foot: Secondary | ICD-10-CM

## 2023-01-25 NOTE — Progress Notes (Unsigned)
Subjective:  Patient ID: Kendra [name], female    DOB: 01-13-1936,  MRN: [id number]   Kendra [name] presents to clinic today for:  Chief Complaint  Patient presents with   Nail Problem    Presents today for a nail trim. -no new problems or complaints  Primary care doctor is Dr. Deatra [name], MD  . Patient notes nails are thick, discolored, elongated and painful in shoegear when trying to ambulate.  She has been using the formula 7 solution to all toenails daily.  She is requesting some type of spacer to keep the third toe from under lapping the second toe on the left foot.  PCP is Deatra [name], MD.  Past Medical History:  Diagnosis Date   Asthma    CAD (coronary artery disease)    last cath 08/2006   Carotid artery disease (HCC)    moderate left ICA stenosis by 2.6 ultrasound   DJD (degenerative joint disease), lumbar    Hearing loss    Hyperlipidemia    Renal artery stenosis (HCC) 05/05/2021   Vision loss of right eye     Allergies  Allergen Reactions   Chlorthalidone Other (See Comments)    Hyponatremia   Other     Other reaction(s): Respiratory Distress   Amlodipine     "shivers"    Ciprofloxacin Other (See Comments)    UNK reaction   Dog Epithelium (Canis Lupus Familiaris) Other (See Comments)    Cat and dog dander   Irbesartan Other (See Comments)   Latex Other (See Comments)    Irritates her skin   Mixed Feathers    Nebivolol Hcl     Other reaction(s): cant remember   Spironolactone     Other Reaction(s): Unknown   Statins     Unable to tolerate statins w the exception of crestor.   Sulfa Antibiotics Other (See Comments)    "didnt feel good"   Sulfamethoxazole Other (See Comments)    Makes her feel bad   Ticagrelor Other (See Comments)   Zetia [Ezetimibe] Other (See Comments)    "makes me constipated"   Penicillins Rash    Review of Systems: Negative except as noted in the HPI.  Objective:  There were no vitals filed for this  visit.  Kendra [name] is a pleasant 87 y.o. female in NAD. AAO x 3.  Vascular Examination: Capillary refill time is 3-5 seconds to toes bilateral. Palpable pedal pulses b/l LE. Digital hair present b/l.  Skin temperature gradient WNL b/l. No varicosities b/l. No cyanosis noted b/l.   Dermatological Examination: Pedal skin with normal turgor, texture and tone b/l. No open wounds. No interdigital macerations b/l. Toenails x10 are 3mm thick, discolored, dystrophic with subungual debris. There is pain with compression of the nail plates.  There is clearing noted of the proximal 20% portion of all toenails since starting the formula 7 solution.  They are elongated x10  Musculoskeletal examination: There is varus rotation of the third toe causing it to underlapped the second toe on the left foot.  This is manually reducible.  Assessment/Plan: 1. Pain due to onychomycosis of toenails of both feet   2. Acquired deformity of toe, left    The mycotic toenails were sharply debrided x10 with sterile nail nippers and a power debriding burr to decrease bulk/thickness and length.    The patient was given 2 different types of spacers to wear between the second and third toes on the left foot to  try to prevent the third toe from under lapping the second.  she is trying to avoid surgery due to her age and other health concerns.  Agree with trying to keep this care conservative.  Return in about 3 months (around 04/27/2023) for RFC.   Clerance Lav, DPM, FACFAS [facility]     [address] [place] , [place] [address]                Office [phone]  Fax [phone]

## 2023-02-18 DIAGNOSIS — Z23 Encounter for immunization: Secondary | ICD-10-CM | POA: Diagnosis not present

## 2023-03-02 DIAGNOSIS — Z1231 Encounter for screening mammogram for malignant neoplasm of breast: Secondary | ICD-10-CM | POA: Diagnosis not present

## 2023-03-23 DIAGNOSIS — D6869 Other thrombophilia: Secondary | ICD-10-CM | POA: Diagnosis not present

## 2023-03-23 DIAGNOSIS — E782 Mixed hyperlipidemia: Secondary | ICD-10-CM | POA: Diagnosis not present

## 2023-03-23 DIAGNOSIS — Z1331 Encounter for screening for depression: Secondary | ICD-10-CM | POA: Diagnosis not present

## 2023-03-23 DIAGNOSIS — I10 Essential (primary) hypertension: Secondary | ICD-10-CM | POA: Diagnosis not present

## 2023-03-23 DIAGNOSIS — J4530 Mild persistent asthma, uncomplicated: Secondary | ICD-10-CM | POA: Diagnosis not present

## 2023-03-23 DIAGNOSIS — Z0000 Encounter for general adult medical examination without abnormal findings: Secondary | ICD-10-CM | POA: Diagnosis not present

## 2023-03-23 DIAGNOSIS — I25118 Atherosclerotic heart disease of native coronary artery with other forms of angina pectoris: Secondary | ICD-10-CM | POA: Diagnosis not present

## 2023-03-23 DIAGNOSIS — Z789 Other specified health status: Secondary | ICD-10-CM | POA: Diagnosis not present

## 2023-03-23 DIAGNOSIS — R7303 Prediabetes: Secondary | ICD-10-CM | POA: Diagnosis not present

## 2023-03-23 DIAGNOSIS — I4819 Other persistent atrial fibrillation: Secondary | ICD-10-CM | POA: Diagnosis not present

## 2023-03-23 DIAGNOSIS — M810 Age-related osteoporosis without current pathological fracture: Secondary | ICD-10-CM | POA: Diagnosis not present

## 2023-03-23 DIAGNOSIS — I700 Atherosclerosis of aorta: Secondary | ICD-10-CM | POA: Diagnosis not present

## 2023-03-27 DIAGNOSIS — Z23 Encounter for immunization: Secondary | ICD-10-CM | POA: Diagnosis not present

## 2023-03-29 ENCOUNTER — Encounter (HOSPITAL_BASED_OUTPATIENT_CLINIC_OR_DEPARTMENT_OTHER): Payer: Self-pay | Admitting: Cardiovascular Disease

## 2023-03-29 ENCOUNTER — Ambulatory Visit (HOSPITAL_BASED_OUTPATIENT_CLINIC_OR_DEPARTMENT_OTHER): Payer: Medicare Other | Admitting: Cardiovascular Disease

## 2023-03-29 VITALS — BP 138/88 | HR 97 | Ht 65.0 in | Wt 122.0 lb

## 2023-03-29 DIAGNOSIS — I701 Atherosclerosis of renal artery: Secondary | ICD-10-CM

## 2023-03-29 DIAGNOSIS — I2510 Atherosclerotic heart disease of native coronary artery without angina pectoris: Secondary | ICD-10-CM

## 2023-03-29 DIAGNOSIS — I4819 Other persistent atrial fibrillation: Secondary | ICD-10-CM | POA: Diagnosis not present

## 2023-03-29 DIAGNOSIS — E782 Mixed hyperlipidemia: Secondary | ICD-10-CM

## 2023-03-29 DIAGNOSIS — I10 Essential (primary) hypertension: Secondary | ICD-10-CM

## 2023-03-29 DIAGNOSIS — I872 Venous insufficiency (chronic) (peripheral): Secondary | ICD-10-CM

## 2023-03-29 NOTE — Progress Notes (Signed)
Cardiology Office Note:  .    Date:  03/29/2023  ID:  Kendra [name], DOB [birth year], MRN [id number] PCP: Deatra [name], MD  [facility] Providers Cardiologist:  Chilton Si, MD     History of Present Illness: .    Kendra [name] is a 87 y.o. female with a hx of asthma, CAD s/p LAD PCI, carotid stenosis, renal artery stenosis, DJD, hypertension, and hyperlipidemia, here for follow up.  She was seen 03/2021 for discussion of medication management. She last saw Dr. Jacinto Halim 02/2021 for her blood pressure. She has intolerances to multiple medications. She had renal artery dopplers 10/2020 which revealed greater than 50% stenosis in the bilaterally. The left renal artery was not fully visualized.  She had a CTA of the abdomen and pelvis 04/2021 which revealed 50% proximal left renal artery stenosis and 2 right renal arteries.  There was 40% proximal stenosis in the larger artery.  She had some mild plaque in the SMA and some stenosis in the IMA.  She also had 50% bilateral common iliac artery stenosis.  At that appointment, hydralazine was reduced to 25 mg. She previously did not tolerate this medicine due to headaches. She last saw Dr. Allyson Sabal 06/2020 and her blood pressure was 138/70. At that time she was on clonidine and losartan. When she was first seen in advanced hypertension clinic her blood pressure was 214/76.  She continued to report intolerance to multiple medications.  Losartan was switched to irbesartan 150 mg twice daily.  Hydralazine was discontinued and she was started on spironolactone.  She was instructed to start taking the clonidine every 8 hours.     In 10/2021, her BP was mostly in the 130s-140s systolic but occasionally spiked to the 200s. She was started on Lisinopril and agreed to take Clonidine as prescribed. She was admitted in 11/2021 with NSTEMI. Left heart cath showed moderate in stent restenosis of prior LAD stent. It was treated with balloon angioplasty with good  result. She followed up with Edd Fabian, NP and had bruising at her cath site but was otherwise well. She split her Lisinopril into twice daily dosing and her evening dose of Clonidine was increased. She had to change Brilinta to Plavix to cost. She was diagnosed with COVID 12/2021. She had an episode of A-fib at cardiac rehab and remained in A-fib when she followed up in A-fib clinic 4 days later. She was started on Eliquis and remained on Metoprolol. They recommended cardioversion but she cancelled it.   At her visit 03/2022, she reported abdominal pain lasting for several hours after taking metoprolol 50 mg tablets. She then switched to taking 2 pills of metoprolol 25 mg BID without further abdominal issues. She had also started taking an additional 25 mg metoprolol at bedtime due to elevated blood pressures. Her home blood pressure log noted a range of 100s-160s with average readings in the 130s systolic. Due to an episode of tongue swelling she had also reduced her lisinopril from BID to daily, and stopped taking her evening dose of clonidine. We discontinued lisinopril and switched her to diltiazem 180 mg daily. At her follow-up with Gillian Shields, NP 04/2022 she had self-discontinued diltiazem as she felt her legs were swelling and dry. Prescribed Lasix 20 mg for three days. She wished to pursue restoration of NSR and was scheduled for DCCV but this was later cancelled. She continued to report LE edema 05/2022 and was instructed to take furosemide 40 mg daily for 2 days,  then 20 mg as needed. She was seen 08/2022 and noted that she stopped her Plavix since the new year due to bruising. She was started on 81 mg ASA daily. She was also concerned that metoprolol was worsening her asthma and causing gastric pain, we agreed to change her metoprolol to 25 mg TID. EKG performed at her visit 12/2022 noted a new finding of RBBB; she was asymptomatic with no indication for further workup.   Today, she states that  ever since she started the metoprolol, she has noticed a post-nasal drip. She feels this in the back of her throat. During the day her symptoms have been relieved with taking Claritin, and occasionally Flonase. Additionally she is concerned that her recent left eye visual disturbances/vision changes have been a side effect of her metoprolol. She has seen her ophthalmologist and was diagnosed with macular degeneration, now on AREDS supplements. In the office her blood pressure is 138/88. Her home readings have been averaging in the high 120s to low 130s, over high 70s-80s. Her resting heart rates have been overall stable since her last visit, mostly in the high 70s to low 80s. She has not seen heart rates over 100 bpm in some time. When she needs to take Lasix, she will try to take it prior to 9 AM. However, she notes that it seems to take a long time to work as she needs to wake up twice to use the restroom on the days she takes the Lasix. In the past month she has fallen out of the habit of routine exercise, but she plans to increase this soon. Prior to a month ago she was walking up to a mile without anginal symptoms. She denies any palpitations, chest pain, shortness of breath, lightheadedness, headaches, syncope, orthopnea, or PND.  ROS:  Please see the history of present illness. All other systems are reviewed and negative.  (+) Post-nasal drip (+) Left eye vision changes (+) Intermittent LE edema  Studies Reviewed: .        Carotid Dopplers  11/19/2022: Summary:  Right Carotid: Velocities in the right ICA are consistent with a 1-39% stenosis.   Left Carotid: Velocities in the left ICA are consistent with a 40-59% stenosis.   Vertebrals: Bilateral vertebral arteries demonstrate antegrade flow.  Subclavians: Normal flow hemodynamics were seen in bilateral subclavian arteries.   Risk Assessment/Calculations:    CHA2DS2-VASc Score = 6   This indicates a 9.7% annual risk of stroke. The  patient's score is based upon: CHF History: 1 HTN History: 1 Diabetes History: 0 Stroke History: 0 Vascular Disease History: 1 Age Score: 2 Gender Score: 1            Physical Exam:    VS:  BP 138/88 (BP Location: Left Arm, Patient Position: Sitting, Cuff Size: Normal)   Pulse 97   Ht 5\' 5"  (1.651 m)   Wt 122 lb (55.3 kg)   SpO2 99%   BMI 20.30 kg/m  , BMI Body mass index is 20.3 kg/m. GENERAL:  Well appearing HEENT: Pupils equal round and reactive, fundi not visualized, oral mucosa unremarkable NECK:  No jugular venous distention, waveform within normal limits, carotid upstroke brisk and symmetric, no bruits, no thyromegaly LUNGS:  Clear to auscultation bilaterally HEART:  Irregularly irregular.  PMI not displaced or sustained,S1 and S2 within normal limits, no S3, no S4, no clicks, no rubs, no murmurs ABD:  Flat, positive bowel sounds normal in frequency in pitch, no bruits, no rebound, no  guarding, no midline pulsatile mass, no hepatomegaly, no splenomegaly EXT:  2 plus pulses throughout, Trace LE edema, no cyanosis no clubbing SKIN:  No rashes no nodules NEURO:  Cranial nerves II through XII grossly intact, motor grossly intact throughout PSYCH:  Cognitively intact, oriented to person place and time  Wt Readings from Last 3 Encounters:  03/29/23 122 lb (55.3 kg)  12/31/22 121 lb 9.6 oz (55.2 kg)  08/28/22 123 lb 12.8 oz (56.2 kg)     ASSESSMENT AND PLAN: .    # Persistent Atrial Fibrillation: Stable with heart rate in the 70s-80s on Metoprolol. Patient reports postnasal drip since starting Metoprolol, which is alleviated by Claritin, suggesting an allergic rather than GERD etiology. She also has concerns about potential vision side effects due to concurrent early macular degeneration.  She did not tolerate diltiazem.   -Refer to electrophysiology for further management options given intolerance to Metoprolol and Diltiazem.  # CAD:  # Hyperlipidemia:  s/p LAD PCI.   She has no ischemic symptoms.  Encouraged her to start exercise.  Continue aspirin, metoprolol and rosuvastatin.  Lipids are well-controlled.    #HFpEF:  #Hypertension:  Mild LE edema on exam.  She struggles with frequent urination but needs it so will try taking it a little earlier.  BP much better controlled than in the past.   She struggles with multiple medication intolerances.  Continue clonidine and metoprolol.  #Carotid stenosis:  Mild on the R and moderate on L 11/2022.  Repeat carotid Dopplers in one year.    General Health Maintenance -Flu shot and COVID-19 booster received. -Plan to follow up in 6 months.      Dispo:  FU with Mahmud Keithly C. Duke Salvia, MD, [facility] in 6 months.  I,Mathew Stumpf,acting as a Neurosurgeon for Chilton Si, MD.,have documented all relevant documentation on the behalf of Chilton Si, MD,as directed by  Chilton Si, MD while in the presence of Chilton Si, MD.  I, Kira Hartl C. Duke Salvia, MD have reviewed all documentation for this visit.  The documentation of the exam, diagnosis, procedures, and orders on 03/29/2023 are all accurate and complete.   Signed, Chilton Si, MD

## 2023-03-29 NOTE — Patient Instructions (Signed)
Medication Instructions:  Your physician recommends that you continue on your current medications as directed. Please refer to the Current Medication list given to you today.  *If you need a refill on your cardiac medications before your next appointment, please call your pharmacy*  Lab Work: NONE  Testing/Procedures: NONE  Follow-Up: At [facility], you and your health needs are our priority.  As part of our continuing mission to provide you with exceptional heart care, we have created designated Provider Care Teams.  These Care Teams include your primary Cardiologist (physician) and Advanced Practice Providers (APPs -  Physician Assistants and Nurse Practitioners) who all work together to provide you with the care you need, when you need it.  We recommend signing up for the patient portal called "MyChart".  Sign up information is provided on this After Visit Summary.  MyChart is used to connect with patients for Virtual Visits (Telemedicine).  Patients are able to view lab/test results, encounter notes, upcoming appointments, etc.  Non-urgent messages can be sent to your provider as well.   To learn more about what you can do with MyChart, go to [email or web address].    Your next appointment:   6 month(s)  Provider:   Chilton Si, MD or Gillian Shields, NP   You have been referred to ELECTROPHYSIOLOGIST

## 2023-03-29 NOTE — Progress Notes (Deleted)
Cardiology Office Note:  .   Date:  03/29/2023  ID:  Kendra [name], DOB 1935-11-28, MRN [id number] PCP: Kendra [name], MD  [facility] Providers Cardiologist:  Chilton Si, MD { Click to update primary MD,subspecialty MD or APP then REFRESH:1}   History of Present Illness: .   Kendra [name] is a 87 y.o. female with a hx of asthma, CAD s/p LAD PCI, carotid stenosis, renal artery stenosis, DJD, hypertension, and hyperlipidemia, here for follow up.  She was seen 03/2021 for discussion of medication management. She last saw Dr. Jacinto [name] 02/2021 for her blood pressure. She has intolerances to multiple medications. She had renal artery dopplers 10/2020 which revealed greater than 50% stenosis in the bilaterally. The left renal artery was not fully visualized.  She had a CTA of the abdomen and pelvis 04/2021 which revealed 50% proximal left renal artery stenosis and 2 right renal arteries.  There was 40% proximal stenosis in the larger artery.  She had some mild plaque in the SMA and some stenosis in the IMA.  She also had 50% bilateral common iliac artery stenosis.  At that appointment, hydralazine was reduced to 25 mg. She previously did not tolerate this medicine due to headaches. She last saw Dr. Allyson [name] 06/2020 and her blood pressure was 138/70. At that time she was on clonidine and losartan. When she was first seen in advanced hypertension clinic her blood pressure was 214/76.  She continued to report intolerance to multiple medications.  Losartan was switched to irbesartan 150 mg twice daily.  Hydralazine was discontinued and she was started on spironolactone.  She was instructed to start taking the clonidine every 8 hours.     At the last visit her BP was mostly in the 130s-140s systolic but occasionally spiked to the 200s. She was started on Lisinopril and agreed to take Clonidine as prescribed. She was admitted in 11/2021 with NSTEMI. Left heart cath showed moderate in stent restenosis  of prior LAD stent. It was treated with balloon angioplasty with good result. She followed up with Kendra Fabian, NP and had bruising at her cath site but was otherwise well. She split her Lisinopril into twice daily dosing and her evening dose of Clonidine was increased. She had to change Brilinta to Plavix to cost. She was diagnosed with COVID 12/2021. She had an episode of A-fib at cardiac rehab and remained in A-fib when she followed up in A-fib clinic 4 days later. She was started on Eliquis and remained on Metoprolol. They recommended cardioversion but she cancelled it.   Today, she states that she has been feeling okay overall. She states that she would feel abdominal pain 1-2 hours after taking her Metoprolol 50mg  that would last for several hours. However, she switched to taking 2 pills of Metoprolol 25mg  and has not had any issues with the pain since. She continued to notice elevated blood pressures despite taking the Metoprolol, so she started taking an additional 25mg  dose at bedtime with improvement. Patient provided her home blood pressure log which shows systolic pressures in the 100s-160s but averaging in the 130s. She states that her heart rates have been in the 80s for the most part. She reports waking up in the middle of the night a few weeks ago with some mild tongue swelling. She was concerned that this was due to her use of Lisinopril, but she has not had any further episodes of oral swelling despite continued use of the Lisinopril. She also complains  of intermittent hoarseness and rhinorrhea. She is staying active and walking 1-1.5 miles a day on a moderate incline with her husband. She reports having trouble with hills due to knee arthritis but denies any chest pain or shortness of breath with exertion. She notes that one of her right arm veins has been much more visible and mildly itchy since a few weeks after angioplasty. She denies any pain associated with this.    ROS: ***  Studies  Reviewed: .        *** Risk Assessment/Calculations:   {Does this patient have ATRIAL FIBRILLATION?:[phone]} No BP recorded.  {Refresh Note OR Click here to enter BP  :1}***       Physical Exam:   VS:  There were no vitals taken for this visit.   Wt Readings from Last 3 Encounters:  12/31/22 121 lb 9.6 oz (55.2 kg)  08/28/22 123 lb 12.8 oz (56.2 kg)  05/28/22 122 lb (55.3 kg)    GEN: Well nourished, well developed in no acute distress NECK: No JVD; No carotid bruits CARDIAC: ***RRR, no murmurs, rubs, gallops RESPIRATORY:  Clear to auscultation without rales, wheezing or rhonchi  ABDOMEN: Soft, non-tender, non-distended EXTREMITIES:  No edema; No deformity   ASSESSMENT AND PLAN: .   ***    {Are you ordering a CV Procedure (e.g. stress test, cath, DCCV, TEE, etc)?   Press F2        :[id number]}  Dispo: ***  Signed, Chilton Si, MD

## 2023-04-22 DIAGNOSIS — M25461 Effusion, right knee: Secondary | ICD-10-CM | POA: Diagnosis not present

## 2023-04-22 DIAGNOSIS — M1711 Unilateral primary osteoarthritis, right knee: Secondary | ICD-10-CM | POA: Diagnosis not present

## 2023-04-23 DIAGNOSIS — H04123 Dry eye syndrome of bilateral lacrimal glands: Secondary | ICD-10-CM | POA: Diagnosis not present

## 2023-04-23 DIAGNOSIS — H401232 Low-tension glaucoma, bilateral, moderate stage: Secondary | ICD-10-CM | POA: Diagnosis not present

## 2023-04-23 DIAGNOSIS — H353132 Nonexudative age-related macular degeneration, bilateral, intermediate dry stage: Secondary | ICD-10-CM | POA: Diagnosis not present

## 2023-04-23 DIAGNOSIS — H532 Diplopia: Secondary | ICD-10-CM | POA: Diagnosis not present

## 2023-04-29 DIAGNOSIS — M25461 Effusion, right knee: Secondary | ICD-10-CM | POA: Diagnosis not present

## 2023-05-04 ENCOUNTER — Encounter: Payer: Self-pay | Admitting: Cardiovascular Disease

## 2023-05-04 ENCOUNTER — Ambulatory Visit: Payer: Medicare Other | Attending: Cardiovascular Disease | Admitting: Cardiovascular Disease

## 2023-05-04 VITALS — BP 124/84 | HR 80 | Ht 65.0 in | Wt 126.8 lb

## 2023-05-04 DIAGNOSIS — R000 Tachycardia, unspecified: Secondary | ICD-10-CM | POA: Diagnosis not present

## 2023-05-04 DIAGNOSIS — I4819 Other persistent atrial fibrillation: Secondary | ICD-10-CM | POA: Insufficient documentation

## 2023-05-04 NOTE — Progress Notes (Signed)
Electrophysiology Office Note:    Date:  05/04/2023   ID:  [name], DOB [birth year], MRN [id number]  PCP:  Deatra [name], MD   [facility] Providers Cardiologist:  Chilton Si, MD     Referring MD: Chilton Si, MD   History of Present Illness:    Kendra [name] is a 87 y.o. female with a medical history significant for longstanding persistent AF, referred for AF management.     I discussed the use of AI scribe software for clinical note transcription with the patient, who gave verbal consent to proceed.  The patient, with a history of coronary artery disease, atrial fibrillation, and arthritis, presents with joint and muscle pain, which she attributes to her current medication regimen, including metoprolol, rosuvastatin, and Eliquis. She reports feeling lightheaded about an hour after taking her morning medications.  The patient was diagnosed with atrial fibrillation following a heart attack and subsequent balloon angioplasty in June 2023. The patient thinks that she has been in atrial fibrillation 70-80% of the time since her diagnosis -- based upon her BP monitor readings. She does not feel the atrial fibrillation unless her heart rate is in the 80s or low 90s.  The reason for this consult today is for AF management. In the past, she experienced abdominal distress after taking metoprolol. This has resolved since she began taking it with meals. She has also tried diltiazem for rate control, but discontinued diltiazem due to leg swelling and dryness. The patient declined a recommended cardioversion.     Today, She reports that she feels well. She does not have any palpitations today. No recent syncope, pre-syncope, or fatigue.  EKGs/Labs/Other Studies Reviewed Today:     Echocardiogram:  TTE 11/07/21 EF 60-65%. Mildly dilated left atrium   Monitors:  Zio 02/04/2022 100% atrial fibrillation, HR 43-171, average 87 Occasional PVCs    EKG:    EKG Interpretation Date/Time:  Tuesday May 04 2023 15:06:26 EST Ventricular Rate:  80 PR Interval:    QRS Duration:  146 QT Interval:  390 QTC Calculation: 449 R Axis:   110  Text Interpretation: Atrial fibrillation Right bundle branch block Left posterior fascicular block Bifascicular block When compared with ECG of 31-Dec-2022 14:24, No significant change was found Confirmed by York Pellant [phone]) on 05/04/2023 3:28:46 PM     Physical Exam:    VS:  BP 124/84 (BP Location: Left Arm, Patient Position: Sitting, Cuff Size: Normal)   Pulse 80   Ht 5\' 5"  (1.651 m)   Wt 126 lb 12.8 oz (57.5 kg)   SpO2 98%   BMI 21.10 kg/m     Wt Readings from Last 3 Encounters:  05/04/23 126 lb 12.8 oz (57.5 kg)  03/29/23 122 lb (55.3 kg)  12/31/22 121 lb 9.6 oz (55.2 kg)     GEN: Appears elderly and thin. in no acute distress CARDIAC: iRRR, no murmurs, rubs, gallops RESPIRATORY:  Normal work of breathing MUSCULOSKELETAL: no edema    ASSESSMENT & PLAN:     Permanent atrial fibrillation She is not a candidate for rhythm control strategies.  I do not think she would tolerate antiarrhythmic drugs, and her likelihood of maintaining durable sinus rhythm at this point is low. Risk of ablation is higher than normal given her age and habitus. She is now tolerating metoprolol 25 mg taking it with food.   Heart rates on prior monitoring were averaging about 85 bpm, and her home pulse checks are similar or better now She  does not tolerate diltiazem at all At this point, I think her best option is to continue metoprolol for rate control  Secondary hypercoagulable state Continue apixaban 5 mg twice daily  I will be happy to see her again if her condition changes. I would not have much to offer other than AVJ ablation and pacemaker.   Signed, Maurice Small, MD  05/04/2023 5:39 PM    [facility]

## 2023-05-04 NOTE — Patient Instructions (Signed)
Medication Instructions:  Your physician recommends that you continue on your current medications as directed. Please refer to the Current Medication list given to you today. *If you need a refill on your cardiac medications before your next appointment, please call your pharmacy*   Follow-Up: At [facility], you and your health needs are our priority.  As part of our continuing mission to provide you with exceptional heart care, we have created designated Provider Care Teams.  These Care Teams include your primary Cardiologist (physician) and Advanced Practice Providers (APPs -  Physician Assistants and Nurse Practitioners) who all work together to provide you with the care you need, when you need it.  We recommend signing up for the patient portal called "MyChart".  Sign up information is provided on this After Visit Summary.  MyChart is used to connect with patients for Virtual Visits (Telemedicine).  Patients are able to view lab/test results, encounter notes, upcoming appointments, etc.  Non-urgent messages can be sent to your provider as well.   To learn more about what you can do with MyChart, go to [email or web address].    Your next appointment:   Follow up with Dr Duke Salvia as previously instructed  Provider:   Chilton Si, MD

## 2023-05-10 ENCOUNTER — Ambulatory Visit (INDEPENDENT_AMBULATORY_CARE_PROVIDER_SITE_OTHER): Payer: Medicare Other | Admitting: Podiatry

## 2023-05-10 ENCOUNTER — Encounter: Payer: Self-pay | Admitting: Podiatry

## 2023-05-10 VITALS — Ht 65.0 in | Wt 126.8 lb

## 2023-05-10 DIAGNOSIS — M79675 Pain in left toe(s): Secondary | ICD-10-CM

## 2023-05-10 DIAGNOSIS — B351 Tinea unguium: Secondary | ICD-10-CM

## 2023-05-10 DIAGNOSIS — M79674 Pain in right toe(s): Secondary | ICD-10-CM

## 2023-05-10 NOTE — Progress Notes (Unsigned)
Subjective:  Patient ID: Kendra [name], female    DOB: 11-Jan-1936,  MRN: [id number]  Kendra [name] presents to clinic today for:  Chief Complaint  Patient presents with   Nail Problem    RFC   Patient notes nails are thick, discolored, elongated and painful in shoegear when trying to ambulate.    PCP is Deatra [name], MD.  Past Medical History:  Diagnosis Date   Asthma    CAD (coronary artery disease)    last cath 08/2006   Carotid artery disease (HCC)    moderate left ICA stenosis by 2.6 ultrasound   DJD (degenerative joint disease), lumbar    Hearing loss    Hyperlipidemia    Renal artery stenosis (HCC) 05/05/2021   Vision loss of right eye    Past Surgical History:  Procedure Laterality Date   ABDOMINAL HYSTERECTOMY  1975   APPENDECTOMY     CORONARY ANGIOPLASTY WITH STENT PLACEMENT  08/2006   taxus stent x2 to LAD, 2.5x59mm and 2.5x4mm; done at [facility]   CORONARY PRESSURE/FFR STUDY N/A 11/07/2021   Procedure: INTRAVASCULAR PRESSURE WIRE/FFR STUDY;  Surgeon: Orbie Pyo, MD;  Location: [facility] INVASIVE CV LAB;  Service: Cardiovascular;  Laterality: N/A;   LEFT HEART CATH AND CORONARY ANGIOGRAPHY N/A 11/07/2021   Procedure: LEFT HEART CATH AND CORONARY ANGIOGRAPHY;  Surgeon: Orbie Pyo, MD;  Location: [facility] INVASIVE CV LAB;  Service: Cardiovascular;  Laterality: N/A;   RENAL ANGIOGRAM  [facility] [place]   TONSILLECTOMY  1994   Allergies  Allergen Reactions   Chlorthalidone Other (See Comments)    Hyponatremia   Other     Other reaction(s): Respiratory Distress   Amlodipine     "shivers"    Ciprofloxacin Other (See Comments)    UNK reaction   Dog Epithelium (Canis Lupus Familiaris) Other (See Comments)    Cat and dog dander   Irbesartan Other (See Comments)   Latex Other (See Comments)    Irritates her skin   Mixed Feathers    Nebivolol Hcl     Other reaction(s): cant remember   Spironolactone     Other  Reaction(s): Unknown   Statins     Unable to tolerate statins w the exception of crestor.   Sulfa Antibiotics Other (See Comments)    "didnt feel good"   Sulfamethoxazole Other (See Comments)    Makes her feel bad   Ticagrelor Other (See Comments)   Zetia [Ezetimibe] Other (See Comments)    "makes me constipated"   Penicillins Rash   Review of Systems: Negative except as noted in the HPI.  Objective:  Kendra [name] is a pleasant 87 y.o. female in NAD. AAO x 3.  Vascular Examination: Capillary refill time is 3-5 seconds to toes bilateral. Palpable pedal pulses b/l LE. Digital hair present b/l.  Skin temperature gradient WNL b/l. No varicosities b/l. No cyanosis noted b/l.   Dermatological Examination: Pedal skin with normal turgor, texture and tone b/l. No open wounds. No interdigital macerations b/l. Toenails x10 are 3mm thick, discolored, dystrophic with subungual debris. There is pain with compression of the nail plates.  They are elongated x10  Assessment/Plan: 1. Pain due to onychomycosis of toenails of both feet    The mycotic toenails were sharply debrided x10 with sterile nail nippers and a power debriding burr to decrease bulk/thickness and length.    Return in about 3 months (around 08/08/2023) for RFC.  Clerance Lav, DPM, FACFAS [facility]     [address] [place], [place] [address]                Office [phone]  Fax [phone]

## 2023-05-30 ENCOUNTER — Other Ambulatory Visit (HOSPITAL_BASED_OUTPATIENT_CLINIC_OR_DEPARTMENT_OTHER): Payer: Self-pay | Admitting: Family

## 2023-05-30 DIAGNOSIS — I4819 Other persistent atrial fibrillation: Secondary | ICD-10-CM

## 2023-05-30 DIAGNOSIS — D6859 Other primary thrombophilia: Secondary | ICD-10-CM

## 2023-06-10 DIAGNOSIS — M1712 Unilateral primary osteoarthritis, left knee: Secondary | ICD-10-CM | POA: Diagnosis not present

## 2023-06-10 DIAGNOSIS — M25462 Effusion, left knee: Secondary | ICD-10-CM | POA: Diagnosis not present

## 2023-07-03 ENCOUNTER — Other Ambulatory Visit (HOSPITAL_BASED_OUTPATIENT_CLINIC_OR_DEPARTMENT_OTHER): Payer: Self-pay | Admitting: Family

## 2023-07-05 DIAGNOSIS — M1712 Unilateral primary osteoarthritis, left knee: Secondary | ICD-10-CM | POA: Diagnosis not present

## 2023-07-05 DIAGNOSIS — M170 Bilateral primary osteoarthritis of knee: Secondary | ICD-10-CM | POA: Diagnosis not present

## 2023-07-26 DIAGNOSIS — I700 Atherosclerosis of aorta: Secondary | ICD-10-CM | POA: Diagnosis not present

## 2023-07-26 DIAGNOSIS — E782 Mixed hyperlipidemia: Secondary | ICD-10-CM | POA: Diagnosis not present

## 2023-07-26 DIAGNOSIS — L253 Unspecified contact dermatitis due to other chemical products: Secondary | ICD-10-CM | POA: Diagnosis not present

## 2023-07-26 DIAGNOSIS — I10 Essential (primary) hypertension: Secondary | ICD-10-CM | POA: Diagnosis not present

## 2023-07-26 DIAGNOSIS — I4819 Other persistent atrial fibrillation: Secondary | ICD-10-CM | POA: Diagnosis not present

## 2023-07-26 DIAGNOSIS — R7303 Prediabetes: Secondary | ICD-10-CM | POA: Diagnosis not present

## 2023-07-26 DIAGNOSIS — L299 Pruritus, unspecified: Secondary | ICD-10-CM | POA: Diagnosis not present

## 2023-07-30 ENCOUNTER — Ambulatory Visit: Payer: Medicare Other | Admitting: Podiatry

## 2023-07-30 ENCOUNTER — Encounter: Payer: Self-pay | Admitting: Podiatry

## 2023-07-30 DIAGNOSIS — B351 Tinea unguium: Secondary | ICD-10-CM | POA: Diagnosis not present

## 2023-07-30 DIAGNOSIS — M79674 Pain in right toe(s): Secondary | ICD-10-CM

## 2023-07-30 DIAGNOSIS — E782 Mixed hyperlipidemia: Secondary | ICD-10-CM | POA: Diagnosis not present

## 2023-07-30 DIAGNOSIS — R7303 Prediabetes: Secondary | ICD-10-CM | POA: Diagnosis not present

## 2023-07-30 DIAGNOSIS — M79675 Pain in left toe(s): Secondary | ICD-10-CM

## 2023-07-31 NOTE — Progress Notes (Signed)
 Subjective:   Patient ID: Kendra [name], female   DOB: 88 y.o.   MRN: [id number]   HPI Patient presents concerned about her nails and whether she may have an infection of the fourth toe on the right foot.  States that she did start Neosporin and it seems somewhat better   ROS      Objective:  Physical Exam  Neurovascular status was intact with thick yellow brittle nailbeds 1-5 both feet several of them being slightly thinner than previous but no indications of acute pathology currently.  No redness no drainage noted     Assessment:  Mycotic nail infections 1-5 both feet that do become bothersome along with possible previous paronychia but appears to be resolved     Plan:  H&P reviewed and at this point debridement of nailbeds 1-5 both feet accomplished no iatrogenic bleeding and discussed if redness drainage were to occur to reappoint but it appears stable currently as far as any infection process

## 2023-08-02 DIAGNOSIS — H903 Sensorineural hearing loss, bilateral: Secondary | ICD-10-CM | POA: Diagnosis not present

## 2023-08-06 DIAGNOSIS — J019 Acute sinusitis, unspecified: Secondary | ICD-10-CM | POA: Diagnosis not present

## 2023-08-09 ENCOUNTER — Ambulatory Visit: Payer: Medicare Other | Admitting: Podiatry

## 2023-08-11 DIAGNOSIS — M170 Bilateral primary osteoarthritis of knee: Secondary | ICD-10-CM | POA: Diagnosis not present

## 2023-08-11 DIAGNOSIS — M7062 Trochanteric bursitis, left hip: Secondary | ICD-10-CM | POA: Diagnosis not present

## 2023-08-11 DIAGNOSIS — M1712 Unilateral primary osteoarthritis, left knee: Secondary | ICD-10-CM | POA: Diagnosis not present

## 2023-08-12 DIAGNOSIS — H0100A Unspecified blepharitis right eye, upper and lower eyelids: Secondary | ICD-10-CM | POA: Diagnosis not present

## 2023-08-12 DIAGNOSIS — H401231 Low-tension glaucoma, bilateral, mild stage: Secondary | ICD-10-CM | POA: Diagnosis not present

## 2023-08-12 DIAGNOSIS — H01004 Unspecified blepharitis left upper eyelid: Secondary | ICD-10-CM | POA: Diagnosis not present

## 2023-08-24 DIAGNOSIS — H5213 Myopia, bilateral: Secondary | ICD-10-CM | POA: Diagnosis not present

## 2023-08-24 DIAGNOSIS — H538 Other visual disturbances: Secondary | ICD-10-CM | POA: Diagnosis not present

## 2023-08-24 DIAGNOSIS — H52223 Regular astigmatism, bilateral: Secondary | ICD-10-CM | POA: Diagnosis not present

## 2023-08-24 DIAGNOSIS — H5022 Vertical strabismus, left eye: Secondary | ICD-10-CM | POA: Diagnosis not present

## 2023-09-14 DIAGNOSIS — M1711 Unilateral primary osteoarthritis, right knee: Secondary | ICD-10-CM | POA: Diagnosis not present

## 2023-09-21 DIAGNOSIS — M1711 Unilateral primary osteoarthritis, right knee: Secondary | ICD-10-CM | POA: Diagnosis not present

## 2023-09-22 DIAGNOSIS — M2012 Hallux valgus (acquired), left foot: Secondary | ICD-10-CM | POA: Diagnosis not present

## 2023-09-22 DIAGNOSIS — M2011 Hallux valgus (acquired), right foot: Secondary | ICD-10-CM | POA: Diagnosis not present

## 2023-09-22 DIAGNOSIS — M19072 Primary osteoarthritis, left ankle and foot: Secondary | ICD-10-CM | POA: Diagnosis not present

## 2023-09-22 DIAGNOSIS — M19071 Primary osteoarthritis, right ankle and foot: Secondary | ICD-10-CM | POA: Diagnosis not present

## 2023-09-27 DIAGNOSIS — M1712 Unilateral primary osteoarthritis, left knee: Secondary | ICD-10-CM | POA: Diagnosis not present

## 2023-09-28 ENCOUNTER — Ambulatory Visit (HOSPITAL_BASED_OUTPATIENT_CLINIC_OR_DEPARTMENT_OTHER): Payer: Medicare Other | Admitting: Family

## 2023-09-28 ENCOUNTER — Encounter (HOSPITAL_BASED_OUTPATIENT_CLINIC_OR_DEPARTMENT_OTHER): Payer: Self-pay | Admitting: Family

## 2023-09-28 VITALS — BP 92/64 | HR 72 | Ht 65.0 in | Wt 125.7 lb

## 2023-09-28 DIAGNOSIS — E785 Hyperlipidemia, unspecified: Secondary | ICD-10-CM

## 2023-09-28 DIAGNOSIS — D6859 Other primary thrombophilia: Secondary | ICD-10-CM

## 2023-09-28 DIAGNOSIS — I10 Essential (primary) hypertension: Secondary | ICD-10-CM | POA: Diagnosis not present

## 2023-09-28 DIAGNOSIS — I6523 Occlusion and stenosis of bilateral carotid arteries: Secondary | ICD-10-CM

## 2023-09-28 DIAGNOSIS — I4819 Other persistent atrial fibrillation: Secondary | ICD-10-CM | POA: Diagnosis not present

## 2023-09-28 DIAGNOSIS — I25118 Atherosclerotic heart disease of native coronary artery with other forms of angina pectoris: Secondary | ICD-10-CM | POA: Diagnosis not present

## 2023-09-28 LAB — CBC
Hematocrit: 47.4 % — ABNORMAL HIGH (ref 34.0–46.6)
Hemoglobin: 16.1 g/dL — ABNORMAL HIGH (ref 11.1–15.9)
MCH: 29.3 pg (ref 26.6–33.0)
MCHC: 34 g/dL (ref 31.5–35.7)
MCV: 86 fL (ref 79–97)
Platelets: 247 10*3/uL (ref 150–450)
RBC: 5.5 x10E6/uL — ABNORMAL HIGH (ref 3.77–5.28)
RDW: 13.8 % (ref 11.7–15.4)
WBC: 10.1 10*3/uL (ref 3.4–10.8)

## 2023-09-28 MED ORDER — METOPROLOL SUCCINATE ER 25 MG PO TB24: 25.0000 mg | ORAL_TABLET | Freq: Three times a day (TID) | ORAL | 3 refills | Status: AC

## 2023-09-28 MED ORDER — NITROGLYCERIN 0.4 MG SL SUBL
0.4000 mg | SUBLINGUAL_TABLET | SUBLINGUAL | 1 refills | Status: AC | PRN
Start: 2023-09-28 — End: ?

## 2023-09-28 MED ORDER — ROSUVASTATIN CALCIUM 10 MG PO TABS: 10.0000 mg | ORAL_TABLET | ORAL | 3 refills | Status: DC

## 2023-09-28 MED ORDER — APIXABAN 2.5 MG PO TABS: 2.5000 mg | ORAL_TABLET | Freq: Two times a day (BID) | ORAL | 6 refills | Status: AC

## 2023-09-28 NOTE — Progress Notes (Signed)
 Cardiology Office Note:  .   Date:  09/30/2023  ID:  [facility], DOB May 29, 1936, MRN [id number] PCP: Sun, Vyvyan, MD  [facility] Providers Cardiologist:  Maudine Sos, MD    History of Present Illness: .   Kendra [name] is a 88 y.o. female with a hx of atrial fibrillation, hypertension, asthma, CAD s/p LAD PCI, carotic stenosis, HLD, DJD, renal artery stenosis.  Blood pressure management has been limited by medication intolerances. Renal artery dopplers 10/2020 >50% stenosis bilaterally. CTA abd/pelvis 04/2021 50% prox L renal artery stenosis and 2 right renal arteries. There was 40% prox stenosis in larger argery. Mild plaque in SMA and some stenosis in IMA. 50% bilateral common iliac artery stenosis.    Established with [facility] HTN clinic with BP 214/76. Losartan  switched to Irbesartan . Hydralazine  stopped. Started on Spironolactone . Clonidine  adjusted.    Admitted 11/2021 for NSTEMI with LHC showing moderate ISR of prior LAD stent treated with balloon angioplasty. Transitioned from Brilinta  to Plavix  due to cost. She had episode of atrial fibrillation and was seen by atrial fib clinic recommended for DCCV but she cancelled the procedure.    Has since politely declined cardioversion. She did not tolerate Diltiazem  due to swelling. At visit 08/28/22  Metoprolol  25mg  changed to TID per her preference.   Seen by Dr. Theodis Fiscal 03/2023 with no ischemia. Mild LE edema but frequent urination. Clonidine  and Metoprolol  continued. Repeat carotid duplex recommended 11/2023.  Seen by Dr. Arlester Ladd 05/04/23. She was tolerating metoprolol  25mg  with food. Recommended to consider rate control. Low likelihood to tolerate AAD and likelihood of maintaining NSR was low.   Presents today for follow up independently. Follows a vegetarian diet. Recently enjoyed celebrating Passover with her family and temple community. Some shortness of breath if she is lifting something heavy like laundry. No  dyspnea with ADLs or usual household tasks. Activity limited by her arthritis in her knees. BP at home has been well controlled for example 113/70 with a heart rate of 98 bpm. Most readings in the 120s/80s. Still with mild GI upset which she attributes to Metoprolol . She is taking her Clonidine  and Metoprolol  TID together.   ROS: Please see the history of present illness.    All other systems reviewed and are negative.   Studies Reviewed: .        Cardiac Studies & Procedures   ______________________________________________________________________________________________ CARDIAC CATHETERIZATION  CARDIAC CATHETERIZATION 11/07/2021  Conclusion   Mid LAD lesion is 65% stenosed.   Scoring balloon angioplasty was performed.   Post intervention, there is a 0% residual stenosis.   LV end diastolic pressure is low.  1.  Moderate in-stent restenosis with an RFR of 0.88 with minimal disease elsewhere.  This was treated with OCT guided balloon angioplasty with a scoring balloon. 2.  LVEDP of 13 mmHg.  Recommendation: Dual antiplatelet therapy for 1 year.  Findings Coronary Findings Diagnostic  Dominance: Right  Left Anterior Descending Mid LAD lesion is 65% stenosed. The lesion was previously treated .  Left Circumflex The vessel exhibits minimal luminal irregularities.  Right Coronary Artery The vessel exhibits minimal luminal irregularities.  Intervention  Mid LAD lesion Angioplasty Scoring balloon angioplasty was performed. Post-Intervention Lesion Assessment The intervention was successful. Pre-interventional TIMI flow is 3. Post-intervention TIMI flow is 3. No complications occurred at this lesion. There is a 0% residual stenosis post intervention.     ECHOCARDIOGRAM  ECHOCARDIOGRAM COMPLETE 11/07/2021  Narrative ECHOCARDIOGRAM REPORT    Patient Name:   Kendra  EDITH [name] Date of Exam: 11/07/2021 Medical Rec #:  [id number]           Height:       65.0 in Accession #:     [id number]          Weight:       117.1 lb Date of Birth:  30-Apr-1936           BSA:          1.575 m Patient Age:    85 years            BP:           176/67 mmHg Patient Gender: F                   HR:           39 bpm. Exam Location:  Inpatient  Procedure: 2D Echo, 3D Echo, Cardiac Doppler and Color Doppler  Indications:    R07.9* Chest pain, unspecified  History:        Patient has prior history of Echocardiogram examinations, most recent 04/10/2020. Previous Myocardial Infarction and CAD, Abnormal ECG, Signs/Symptoms:Edema; Risk Factors:Hypertension and Dyslipidemia.  Sonographer:    Raynelle Callow RDCS Referring Phys: [id number] Casimer Clear   Sonographer Comments: Technically difficult study due to poor echo windows. IMPRESSIONS   1. Left ventricular ejection fraction, by estimation, is 60 to 65%. The left ventricle has normal function. The left ventricle has no regional wall motion abnormalities. Left ventricular diastolic function could not be evaluated. 2. Right ventricular systolic function is normal. The right ventricular size is normal. Tricuspid regurgitation signal is inadequate for assessing PA pressure. 3. Left atrial size was mildly dilated. 4. The mitral valve is normal in structure. No evidence of mitral valve regurgitation. Moderate mitral annular calcification. 5. The aortic valve is normal in structure. There is mild calcification of the aortic valve. Aortic valve regurgitation is not visualized. 6. Aortic no significant ascending aortic aneurysm. 7. The inferior vena cava is dilated in size with >50% respiratory variability, suggesting right atrial pressure of 8 mmHg.  Comparison(s): No significant change from prior study.  FINDINGS Left Ventricle: Left ventricular ejection fraction, by estimation, is 60 to 65%. The left ventricle has normal function. The left ventricle has no regional wall motion abnormalities. The left ventricular internal cavity size was  normal in size. There is no left ventricular hypertrophy. Left ventricular diastolic function could not be evaluated.  Right Ventricle: The right ventricular size is normal. Right ventricular systolic function is normal. Tricuspid regurgitation signal is inadequate for assessing PA pressure.  Left Atrium: Left atrial size was mildly dilated.  Right Atrium: Right atrial size was normal in size.  Pericardium: There is no evidence of pericardial effusion.  Mitral Valve: The mitral valve is normal in structure. Moderate mitral annular calcification. No evidence of mitral valve regurgitation. MV peak gradient, 6.0 mmHg. The mean mitral valve gradient is 1.0 mmHg.  Tricuspid Valve: The tricuspid valve is normal in structure. Tricuspid valve regurgitation is not demonstrated.  Aortic Valve: The aortic valve is normal in structure. There is mild calcification of the aortic valve. Aortic valve regurgitation is not visualized.  Pulmonic Valve: The pulmonic valve was not well visualized. Pulmonic valve regurgitation is not visualized.  Aorta: No significant ascending aortic aneurysm.  Venous: The inferior vena cava is dilated in size with greater than 50% respiratory variability, suggesting right atrial pressure of 8 mmHg.   LEFT VENTRICLE PLAX 2D  LVIDd:         4.40 cm LVIDs:         2.90 cm LV PW:         0.90 cm LV IVS:        0.90 cm LVOT diam:     2.10 cm     3D Volume EF: LV SV:         86          3D EF:        63 % LV SV Index:   55          LV EDV:       78 ml LVOT Area:     3.46 cm    LV ESV:       29 ml LV SV:        49 ml  LV Volumes (MOD) LV vol d, MOD A2C: 61.6 ml LV vol d, MOD A4C: 59.0 ml LV vol s, MOD A2C: 22.4 ml LV vol s, MOD A4C: 18.1 ml LV SV MOD A2C:     39.2 ml LV SV MOD A4C:     59.0 ml LV SV MOD BP:      41.5 ml  RIGHT VENTRICLE            IVC RV S prime:     7.83 cm/s  IVC diam: 2.30 cm TAPSE (M-mode): 2.3 cm  LEFT ATRIUM           Index        RIGHT  ATRIUM           Index LA diam:      4.00 cm 2.54 cm/m   RA Area:     12.90 cm LA Vol (A2C): 12.6 ml 8.00 ml/m   RA Volume:   33.50 ml  21.26 ml/m LA Vol (A4C): 50.2 ml 31.86 ml/m AORTIC VALVE LVOT Vmax:   95.10 cm/s LVOT Vmean:  60.600 cm/s LVOT VTI:    0.248 m  AORTA Ao Root diam: 3.30 cm Ao Asc diam:  3.60 cm  MITRAL VALVE MV Area (PHT): 3.12 cm    SHUNTS MV Area VTI:   1.57 cm    Systemic VTI:  0.25 m MV Peak grad:  6.0 mmHg    Systemic Diam: 2.10 cm MV Mean grad:  1.0 mmHg MV Vmax:       1.22 m/s MV Vmean:      50.3 cm/s MV Decel Time: 243 msec MV E velocity: 97.50 cm/s MV A velocity: 99.40 cm/s MV E/A ratio:  0.98  [place] signed by Alois Arnt Signature Date/Time: 11/07/2021/12:13:58 PM    Final    MONITORS  LONG TERM MONITOR (3-14 DAYS) 12/30/2021  Narrative 3 Day Zio Monitor  Quality: Fair.  Baseline artifact. Predominant rhythm: atrial fibrillation Average heart rate: 87 bpm Max heart rate: 171 bpm Min heart rate: 43 bpm Pauses >2.5 seconds: none Occasional PVCs   Tiffany C. Theodis Fiscal, MD, [facility] 02/04/2022 8:22 AM       ______________________________________________________________________________________________      Risk Assessment/Calculations:    CHA2DS2-VASc Score = 6   This indicates a 9.7% annual risk of stroke. The patient's score is based upon: CHF History: 1 HTN History: 1 Diabetes History: 0 Stroke History: 0 Vascular Disease History: 1 Age Score: 2 Gender Score: 1           Physical Exam:   VS:  BP 92/64   Pulse 72  Ht 5\' 5"  (1.651 m)   Wt 125 lb 11.2 oz (57 kg)   SpO2 99%   BMI 20.92 kg/m    Wt Readings from Last 3 Encounters:  09/28/23 125 lb 11.2 oz (57 kg)  05/10/23 126 lb 12.8 oz (57.5 kg)  05/04/23 126 lb 12.8 oz (57.5 kg)    GEN: Well nourished, well developed in no acute distress NECK: No JVD; No carotid bruits CARDIAC: RRR, no murmurs, rubs, gallops RESPIRATORY:  Clear to  auscultation without rales, wheezing or rhonchi  ABDOMEN: Soft, non-tender, non-distended EXTREMITIES:  No edema; No deformity   ASSESSMENT AND PLAN: .   Persistent atrial fibrillation / Hypercoagulable state- Rate controlled today. Has previously declined cardioversion. She is overall asymptomatic regarding atrial fib. Continue Metoprolol  25mg  TID per her preference. Continue Eliquis  2.5mg  BID (reduced dose due to age, body habitus). Previously did not tolerate Nebivolol , Diltaizem.    HTN -  Relatively hypotensive but asymptomatic with no lightheadedness, dizziness.  Continue metoprolol  25 mg 3 times daily, Lasix  20 mg daily, and clonidine  0.1 mg 3 times daily. BP well controlled. Continue current antihypertensive regimen.  She was educated to contact the office if she develops symptoms of hypotension we will consider reducing her regimen.   CAD/HLD, LDL goal less than 70-  Stable with no anginal symptoms. No indication for ischemic evaluation.  Known DES to LAD. LHC 11/07/21 due to NSTEMI with 65% stenosis of LAD stent requiring balloon angioplasty and otherwise minimal atherosclerosis. Continue Aspirin  81 mg daily, Rosuvastatin  10 mg every other day. Recommend aiming for 150 minutes of moderate intensity activity per week and following a heart healthy diet.     RBBB - Monitor with periodic EKG. No near syncope, syncope.   Carotid stenosis - Due for repeat duple 11/2023.   LE edema - stable edema on exam. Continue Lasix  20mg  daily.   Mild renal artery stenosis - Renal duplex 10/30/21 with bilateral 1-59% stenosis. BP control, as above. Repeat duplex as clinically indicated as was stable 2022 and 2023. Continue Rosuvastatin .          Dispo: follow up in 6 mos  Signed, Clearnce Curia, NP

## 2023-09-28 NOTE — Patient Instructions (Signed)
 Medication Instructions:  Continue your current medications.  *If you need a refill on your cardiac medications before your next appointment, please call your pharmacy*  Lab Work: Your physician recommends that you return for lab work today: CBC If you have labs (blood work) drawn today and your tests are completely normal, you will receive your results only by: MyChart Message (if you have MyChart) OR A paper copy in the mail If you have any lab test that is abnormal or we need to change your treatment, we will call you to review the results.   Follow-Up: At [facility], you and your health needs are our priority.  As part of our continuing mission to provide you with exceptional heart care, our providers are all part of one team.  This team includes your primary Cardiologist (physician) and Advanced Practice Providers or APPs (Physician Assistants and Nurse Practitioners) who all work together to provide you with the care you need, when you need it.  Your next appointment:   6 month(s)  Provider:   Maudine Sos, MD, Slater Duncan, NP, or Neomi Banks, NP    We recommend signing up for the patient portal called "MyChart".  Sign up information is provided on this After Visit Summary.  MyChart is used to connect with patients for Virtual Visits (Telemedicine).  Patients are able to view lab/test results, encounter notes, upcoming appointments, etc.  Non-urgent messages can be sent to your provider as well.   To learn more about what you can do with MyChart, go to [email or web address].

## 2023-09-29 ENCOUNTER — Encounter (HOSPITAL_BASED_OUTPATIENT_CLINIC_OR_DEPARTMENT_OTHER): Payer: Self-pay

## 2023-09-29 DIAGNOSIS — D6859 Other primary thrombophilia: Secondary | ICD-10-CM

## 2023-09-30 DIAGNOSIS — H401231 Low-tension glaucoma, bilateral, mild stage: Secondary | ICD-10-CM | POA: Diagnosis not present

## 2023-09-30 NOTE — Telephone Encounter (Signed)
Results called to patient who verbalizes understanding!   

## 2023-10-04 DIAGNOSIS — M1712 Unilateral primary osteoarthritis, left knee: Secondary | ICD-10-CM | POA: Diagnosis not present

## 2023-10-04 DIAGNOSIS — S39012A Strain of muscle, fascia and tendon of lower back, initial encounter: Secondary | ICD-10-CM | POA: Diagnosis not present

## 2023-10-11 DIAGNOSIS — R262 Difficulty in walking, not elsewhere classified: Secondary | ICD-10-CM | POA: Diagnosis not present

## 2023-10-11 DIAGNOSIS — M6281 Muscle weakness (generalized): Secondary | ICD-10-CM | POA: Diagnosis not present

## 2023-10-11 DIAGNOSIS — M47896 Other spondylosis, lumbar region: Secondary | ICD-10-CM | POA: Diagnosis not present

## 2023-10-14 DIAGNOSIS — R262 Difficulty in walking, not elsewhere classified: Secondary | ICD-10-CM | POA: Diagnosis not present

## 2023-10-14 DIAGNOSIS — M47896 Other spondylosis, lumbar region: Secondary | ICD-10-CM | POA: Diagnosis not present

## 2023-10-14 DIAGNOSIS — M6281 Muscle weakness (generalized): Secondary | ICD-10-CM | POA: Diagnosis not present

## 2023-10-18 DIAGNOSIS — R262 Difficulty in walking, not elsewhere classified: Secondary | ICD-10-CM | POA: Diagnosis not present

## 2023-10-18 DIAGNOSIS — M47896 Other spondylosis, lumbar region: Secondary | ICD-10-CM | POA: Diagnosis not present

## 2023-10-18 DIAGNOSIS — M6281 Muscle weakness (generalized): Secondary | ICD-10-CM | POA: Diagnosis not present

## 2023-10-21 DIAGNOSIS — R262 Difficulty in walking, not elsewhere classified: Secondary | ICD-10-CM | POA: Diagnosis not present

## 2023-10-21 DIAGNOSIS — M6281 Muscle weakness (generalized): Secondary | ICD-10-CM | POA: Diagnosis not present

## 2023-10-21 DIAGNOSIS — M47896 Other spondylosis, lumbar region: Secondary | ICD-10-CM | POA: Diagnosis not present

## 2023-11-02 DIAGNOSIS — M6281 Muscle weakness (generalized): Secondary | ICD-10-CM | POA: Diagnosis not present

## 2023-11-02 DIAGNOSIS — R262 Difficulty in walking, not elsewhere classified: Secondary | ICD-10-CM | POA: Diagnosis not present

## 2023-11-02 DIAGNOSIS — M47896 Other spondylosis, lumbar region: Secondary | ICD-10-CM | POA: Diagnosis not present

## 2023-11-05 DIAGNOSIS — M47896 Other spondylosis, lumbar region: Secondary | ICD-10-CM | POA: Diagnosis not present

## 2023-11-05 DIAGNOSIS — M6281 Muscle weakness (generalized): Secondary | ICD-10-CM | POA: Diagnosis not present

## 2023-11-05 DIAGNOSIS — R262 Difficulty in walking, not elsewhere classified: Secondary | ICD-10-CM | POA: Diagnosis not present

## 2023-11-08 ENCOUNTER — Ambulatory Visit: Payer: Medicare Other | Admitting: Podiatry

## 2023-11-09 DIAGNOSIS — M47896 Other spondylosis, lumbar region: Secondary | ICD-10-CM | POA: Diagnosis not present

## 2023-11-09 DIAGNOSIS — R262 Difficulty in walking, not elsewhere classified: Secondary | ICD-10-CM | POA: Diagnosis not present

## 2023-11-09 DIAGNOSIS — M6281 Muscle weakness (generalized): Secondary | ICD-10-CM | POA: Diagnosis not present

## 2023-11-12 DIAGNOSIS — R262 Difficulty in walking, not elsewhere classified: Secondary | ICD-10-CM | POA: Diagnosis not present

## 2023-11-12 DIAGNOSIS — M6281 Muscle weakness (generalized): Secondary | ICD-10-CM | POA: Diagnosis not present

## 2023-11-12 DIAGNOSIS — M47896 Other spondylosis, lumbar region: Secondary | ICD-10-CM | POA: Diagnosis not present

## 2023-11-15 DIAGNOSIS — M6281 Muscle weakness (generalized): Secondary | ICD-10-CM | POA: Diagnosis not present

## 2023-11-15 DIAGNOSIS — R262 Difficulty in walking, not elsewhere classified: Secondary | ICD-10-CM | POA: Diagnosis not present

## 2023-11-15 DIAGNOSIS — M47896 Other spondylosis, lumbar region: Secondary | ICD-10-CM | POA: Diagnosis not present

## 2023-11-18 DIAGNOSIS — M4722 Other spondylosis with radiculopathy, cervical region: Secondary | ICD-10-CM | POA: Diagnosis not present

## 2023-11-18 DIAGNOSIS — M67912 Unspecified disorder of synovium and tendon, left shoulder: Secondary | ICD-10-CM | POA: Diagnosis not present

## 2023-11-22 ENCOUNTER — Ambulatory Visit: Admitting: Podiatry

## 2023-11-23 DIAGNOSIS — I4819 Other persistent atrial fibrillation: Secondary | ICD-10-CM | POA: Diagnosis not present

## 2023-11-23 DIAGNOSIS — I10 Essential (primary) hypertension: Secondary | ICD-10-CM | POA: Diagnosis not present

## 2023-11-23 DIAGNOSIS — R7303 Prediabetes: Secondary | ICD-10-CM | POA: Diagnosis not present

## 2023-11-25 DIAGNOSIS — M6281 Muscle weakness (generalized): Secondary | ICD-10-CM | POA: Diagnosis not present

## 2023-11-25 DIAGNOSIS — M47896 Other spondylosis, lumbar region: Secondary | ICD-10-CM | POA: Diagnosis not present

## 2023-11-25 DIAGNOSIS — R262 Difficulty in walking, not elsewhere classified: Secondary | ICD-10-CM | POA: Diagnosis not present

## 2023-11-29 DIAGNOSIS — R262 Difficulty in walking, not elsewhere classified: Secondary | ICD-10-CM | POA: Diagnosis not present

## 2023-11-29 DIAGNOSIS — M47896 Other spondylosis, lumbar region: Secondary | ICD-10-CM | POA: Diagnosis not present

## 2023-11-29 DIAGNOSIS — M6281 Muscle weakness (generalized): Secondary | ICD-10-CM | POA: Diagnosis not present

## 2023-12-01 DIAGNOSIS — M47896 Other spondylosis, lumbar region: Secondary | ICD-10-CM | POA: Diagnosis not present

## 2023-12-01 DIAGNOSIS — R262 Difficulty in walking, not elsewhere classified: Secondary | ICD-10-CM | POA: Diagnosis not present

## 2023-12-01 DIAGNOSIS — M6281 Muscle weakness (generalized): Secondary | ICD-10-CM | POA: Diagnosis not present

## 2023-12-03 ENCOUNTER — Ambulatory Visit (HOSPITAL_BASED_OUTPATIENT_CLINIC_OR_DEPARTMENT_OTHER)

## 2023-12-03 DIAGNOSIS — I6523 Occlusion and stenosis of bilateral carotid arteries: Secondary | ICD-10-CM

## 2023-12-06 ENCOUNTER — Ambulatory Visit (HOSPITAL_BASED_OUTPATIENT_CLINIC_OR_DEPARTMENT_OTHER): Payer: Self-pay | Admitting: Family

## 2023-12-06 DIAGNOSIS — M25569 Pain in unspecified knee: Secondary | ICD-10-CM | POA: Diagnosis not present

## 2023-12-06 DIAGNOSIS — Z6820 Body mass index (BMI) 20.0-20.9, adult: Secondary | ICD-10-CM | POA: Diagnosis not present

## 2023-12-06 DIAGNOSIS — M79641 Pain in right hand: Secondary | ICD-10-CM | POA: Diagnosis not present

## 2023-12-06 DIAGNOSIS — M6281 Muscle weakness (generalized): Secondary | ICD-10-CM | POA: Diagnosis not present

## 2023-12-06 DIAGNOSIS — M47896 Other spondylosis, lumbar region: Secondary | ICD-10-CM | POA: Diagnosis not present

## 2023-12-06 DIAGNOSIS — R262 Difficulty in walking, not elsewhere classified: Secondary | ICD-10-CM | POA: Diagnosis not present

## 2023-12-06 DIAGNOSIS — M2540 Effusion, unspecified joint: Secondary | ICD-10-CM | POA: Diagnosis not present

## 2023-12-06 DIAGNOSIS — M79642 Pain in left hand: Secondary | ICD-10-CM | POA: Diagnosis not present

## 2023-12-06 DIAGNOSIS — I6523 Occlusion and stenosis of bilateral carotid arteries: Secondary | ICD-10-CM

## 2023-12-13 DIAGNOSIS — M6281 Muscle weakness (generalized): Secondary | ICD-10-CM | POA: Diagnosis not present

## 2023-12-13 DIAGNOSIS — M47896 Other spondylosis, lumbar region: Secondary | ICD-10-CM | POA: Diagnosis not present

## 2023-12-13 DIAGNOSIS — R269 Unspecified abnormalities of gait and mobility: Secondary | ICD-10-CM | POA: Diagnosis not present

## 2023-12-16 DIAGNOSIS — R262 Difficulty in walking, not elsewhere classified: Secondary | ICD-10-CM | POA: Diagnosis not present

## 2023-12-16 DIAGNOSIS — M6281 Muscle weakness (generalized): Secondary | ICD-10-CM | POA: Diagnosis not present

## 2023-12-16 DIAGNOSIS — M47896 Other spondylosis, lumbar region: Secondary | ICD-10-CM | POA: Diagnosis not present

## 2023-12-20 ENCOUNTER — Ambulatory Visit (INDEPENDENT_AMBULATORY_CARE_PROVIDER_SITE_OTHER): Admitting: Podiatry

## 2023-12-20 DIAGNOSIS — M79674 Pain in right toe(s): Secondary | ICD-10-CM

## 2023-12-20 DIAGNOSIS — B351 Tinea unguium: Secondary | ICD-10-CM

## 2023-12-20 DIAGNOSIS — M79675 Pain in left toe(s): Secondary | ICD-10-CM | POA: Diagnosis not present

## 2023-12-20 MED ORDER — CICLOPIROX 8 % EX SOLN: Freq: Every day | CUTANEOUS | 11 refills | Status: AC

## 2023-12-20 NOTE — Progress Notes (Unsigned)
 Subjective:  Patient ID: Kendra [name], female    DOB: 20-Apr-1936,  MRN: [id number]  Martyna Thorns presents to clinic today for:  Chief Complaint  Patient presents with   Nail Problem    Nail trim    Patient notes nails are thick, discolored, elongated and painful in shoegear when trying to ambulate.    PCP is Sun, Vyvyan, MD.  Past Medical History:  Diagnosis Date   Asthma    CAD (coronary artery disease)    last cath 08/2006   Carotid artery disease (HCC)    moderate left ICA stenosis by 2.6 ultrasound   DJD (degenerative joint disease), lumbar    Hearing loss    Hyperlipidemia    Renal artery stenosis (HCC) 05/05/2021   Vision loss of right eye    Past Surgical History:  Procedure Laterality Date   ABDOMINAL HYSTERECTOMY  1975   APPENDECTOMY     CORONARY ANGIOPLASTY WITH STENT PLACEMENT  08/2006   taxus stent x2 to LAD, 2.5x52mm and 2.5x62mm; done at [facility]   CORONARY PRESSURE/FFR STUDY N/A 11/07/2021   Procedure: INTRAVASCULAR PRESSURE WIRE/FFR STUDY;  Surgeon: Wendel Lurena POUR, MD;  Location: [facility] INVASIVE [facility] LAB;  Service: Cardiovascular;  Laterality: N/A;   LEFT HEART CATH AND CORONARY ANGIOGRAPHY N/A 11/07/2021   Procedure: LEFT HEART CATH AND CORONARY ANGIOGRAPHY;  Surgeon: Wendel Lurena POUR, MD;  Location: [facility] INVASIVE CV LAB;  Service: Cardiovascular;  Laterality: N/A;   RENAL ANGIOGRAM  [facility] in [place]   TONSILLECTOMY  1994   Allergies  Allergen Reactions   Chlorthalidone  Other (See Comments)    Hyponatremia   Other     Other reaction(s): Respiratory Distress   Amlodipine     shivers    Ciprofloxacin Other (See Comments)    UNK reaction   Dog Epithelium (Canis Lupus Familiaris) Other (See Comments)    Cat and dog dander   Irbesartan  Other (See Comments)   Latex Other (See Comments)    Irritates her skin   Mixed Feathers    Nebivolol  Hcl     Other reaction(s): cant remember   Spironolactone      Other  Reaction(s): Unknown   Statins     Unable to tolerate statins w the exception of crestor .   Sulfa Antibiotics Other (See Comments)    didnt feel good   Sulfamethoxazole Other (See Comments)    Makes her feel bad   Ticagrelor  Other (See Comments)   Zetia [Ezetimibe] Other (See Comments)    makes me constipated   Penicillins Rash    Review of Systems: Negative except as noted in the HPI.  Objective:  Kendra [name] is a pleasant 88 y.o. female in NAD. AAO x 3.  Vascular Examination: Capillary refill time is 3-5 seconds to toes bilateral. Palpable pedal pulses b/l LE. Digital hair present b/l.  Skin temperature gradient WNL b/l. No varicosities b/l. No cyanosis noted b/l.   Dermatological Examination: Pedal skin with normal turgor, texture and tone b/l. No open wounds. No interdigital macerations b/l. Toenails x10 are 3mm thick, discolored, dystrophic with subungual debris. There is pain with compression of the nail plates.  They are elongated x10  Assessment/Plan: 1. Pain due to onychomycosis of toenails of both feet     Meds ordered this encounter  Medications   ciclopirox  (PENLAC ) 8 % solution    Sig: Apply topically at bedtime. Apply thin layer over nail. Apply daily over previous coat. Remove  weekly with polish remover.    Dispense:  6.6 mL    Refill:  11   The mycotic toenails were sharply debrided x10 with sterile nail nippers and a power debriding burr to decrease bulk/thickness and length.    Patient noted she like to start treating the fungal toenails.  Discussed all treatment options including oral, topical, and laser.  She would like to start with prescription topical treatment at this time.  Sent in prescription ciclopirox  nail lacquer to apply a thin amount daily to the toenail and remove once weekly.  Treatment may take 10 to 12 months of continuous use.  Return in about 3 months (around 03/21/2024) for RFC.   Awanda CHARM Imperial, DPM, FACFAS [facility]     [address] [place], [place] [address]                Office [phone]  Fax [phone]

## 2023-12-24 DIAGNOSIS — M6281 Muscle weakness (generalized): Secondary | ICD-10-CM | POA: Diagnosis not present

## 2023-12-24 DIAGNOSIS — R262 Difficulty in walking, not elsewhere classified: Secondary | ICD-10-CM | POA: Diagnosis not present

## 2023-12-24 DIAGNOSIS — M47896 Other spondylosis, lumbar region: Secondary | ICD-10-CM | POA: Diagnosis not present

## 2023-12-27 DIAGNOSIS — M25569 Pain in unspecified knee: Secondary | ICD-10-CM | POA: Diagnosis not present

## 2023-12-27 DIAGNOSIS — M79642 Pain in left hand: Secondary | ICD-10-CM | POA: Diagnosis not present

## 2023-12-27 DIAGNOSIS — M79641 Pain in right hand: Secondary | ICD-10-CM | POA: Diagnosis not present

## 2023-12-27 DIAGNOSIS — Z6820 Body mass index (BMI) 20.0-20.9, adult: Secondary | ICD-10-CM | POA: Diagnosis not present

## 2023-12-27 DIAGNOSIS — M2540 Effusion, unspecified joint: Secondary | ICD-10-CM | POA: Diagnosis not present

## 2023-12-28 DIAGNOSIS — M6281 Muscle weakness (generalized): Secondary | ICD-10-CM | POA: Diagnosis not present

## 2023-12-28 DIAGNOSIS — R262 Difficulty in walking, not elsewhere classified: Secondary | ICD-10-CM | POA: Diagnosis not present

## 2023-12-28 DIAGNOSIS — M47896 Other spondylosis, lumbar region: Secondary | ICD-10-CM | POA: Diagnosis not present

## 2023-12-30 DIAGNOSIS — R262 Difficulty in walking, not elsewhere classified: Secondary | ICD-10-CM | POA: Diagnosis not present

## 2023-12-30 DIAGNOSIS — M6281 Muscle weakness (generalized): Secondary | ICD-10-CM | POA: Diagnosis not present

## 2023-12-30 DIAGNOSIS — M47896 Other spondylosis, lumbar region: Secondary | ICD-10-CM | POA: Diagnosis not present

## 2024-01-10 DIAGNOSIS — D229 Melanocytic nevi, unspecified: Secondary | ICD-10-CM | POA: Diagnosis not present

## 2024-01-10 DIAGNOSIS — L649 Androgenic alopecia, unspecified: Secondary | ICD-10-CM | POA: Diagnosis not present

## 2024-01-10 DIAGNOSIS — D1801 Hemangioma of skin and subcutaneous tissue: Secondary | ICD-10-CM | POA: Diagnosis not present

## 2024-01-10 DIAGNOSIS — L814 Other melanin hyperpigmentation: Secondary | ICD-10-CM | POA: Diagnosis not present

## 2024-01-10 DIAGNOSIS — L570 Actinic keratosis: Secondary | ICD-10-CM | POA: Diagnosis not present

## 2024-01-10 DIAGNOSIS — L578 Other skin changes due to chronic exposure to nonionizing radiation: Secondary | ICD-10-CM | POA: Diagnosis not present

## 2024-01-10 DIAGNOSIS — L821 Other seborrheic keratosis: Secondary | ICD-10-CM | POA: Diagnosis not present

## 2024-01-12 DIAGNOSIS — M47896 Other spondylosis, lumbar region: Secondary | ICD-10-CM | POA: Diagnosis not present

## 2024-01-12 DIAGNOSIS — R262 Difficulty in walking, not elsewhere classified: Secondary | ICD-10-CM | POA: Diagnosis not present

## 2024-01-12 DIAGNOSIS — M6281 Muscle weakness (generalized): Secondary | ICD-10-CM | POA: Diagnosis not present

## 2024-01-14 DIAGNOSIS — R262 Difficulty in walking, not elsewhere classified: Secondary | ICD-10-CM | POA: Diagnosis not present

## 2024-01-14 DIAGNOSIS — M47896 Other spondylosis, lumbar region: Secondary | ICD-10-CM | POA: Diagnosis not present

## 2024-01-14 DIAGNOSIS — M6281 Muscle weakness (generalized): Secondary | ICD-10-CM | POA: Diagnosis not present

## 2024-01-17 ENCOUNTER — Other Ambulatory Visit (HOSPITAL_BASED_OUTPATIENT_CLINIC_OR_DEPARTMENT_OTHER): Payer: Self-pay | Admitting: Family

## 2024-01-17 DIAGNOSIS — R262 Difficulty in walking, not elsewhere classified: Secondary | ICD-10-CM | POA: Diagnosis not present

## 2024-01-17 DIAGNOSIS — M6281 Muscle weakness (generalized): Secondary | ICD-10-CM | POA: Diagnosis not present

## 2024-01-17 DIAGNOSIS — I10 Essential (primary) hypertension: Secondary | ICD-10-CM

## 2024-01-17 DIAGNOSIS — M47896 Other spondylosis, lumbar region: Secondary | ICD-10-CM | POA: Diagnosis not present

## 2024-01-19 DIAGNOSIS — H04123 Dry eye syndrome of bilateral lacrimal glands: Secondary | ICD-10-CM | POA: Diagnosis not present

## 2024-01-19 DIAGNOSIS — H401133 Primary open-angle glaucoma, bilateral, severe stage: Secondary | ICD-10-CM | POA: Diagnosis not present

## 2024-01-21 DIAGNOSIS — M47896 Other spondylosis, lumbar region: Secondary | ICD-10-CM | POA: Diagnosis not present

## 2024-01-21 DIAGNOSIS — N644 Mastodynia: Secondary | ICD-10-CM | POA: Diagnosis not present

## 2024-01-21 DIAGNOSIS — R262 Difficulty in walking, not elsewhere classified: Secondary | ICD-10-CM | POA: Diagnosis not present

## 2024-01-21 DIAGNOSIS — M6281 Muscle weakness (generalized): Secondary | ICD-10-CM | POA: Diagnosis not present

## 2024-01-23 DIAGNOSIS — Z23 Encounter for immunization: Secondary | ICD-10-CM | POA: Diagnosis not present

## 2024-01-24 DIAGNOSIS — E782 Mixed hyperlipidemia: Secondary | ICD-10-CM | POA: Diagnosis not present

## 2024-01-24 DIAGNOSIS — R7303 Prediabetes: Secondary | ICD-10-CM | POA: Diagnosis not present

## 2024-01-24 DIAGNOSIS — Z713 Dietary counseling and surveillance: Secondary | ICD-10-CM | POA: Diagnosis not present

## 2024-01-24 DIAGNOSIS — I10 Essential (primary) hypertension: Secondary | ICD-10-CM | POA: Diagnosis not present

## 2024-01-27 DIAGNOSIS — N644 Mastodynia: Secondary | ICD-10-CM | POA: Diagnosis not present

## 2024-01-27 DIAGNOSIS — R92323 Mammographic fibroglandular density, bilateral breasts: Secondary | ICD-10-CM | POA: Diagnosis not present

## 2024-01-27 DIAGNOSIS — R928 Other abnormal and inconclusive findings on diagnostic imaging of breast: Secondary | ICD-10-CM | POA: Diagnosis not present

## 2024-01-31 DIAGNOSIS — M47896 Other spondylosis, lumbar region: Secondary | ICD-10-CM | POA: Diagnosis not present

## 2024-01-31 DIAGNOSIS — M6281 Muscle weakness (generalized): Secondary | ICD-10-CM | POA: Diagnosis not present

## 2024-01-31 DIAGNOSIS — H6123 Impacted cerumen, bilateral: Secondary | ICD-10-CM | POA: Diagnosis not present

## 2024-01-31 DIAGNOSIS — R262 Difficulty in walking, not elsewhere classified: Secondary | ICD-10-CM | POA: Diagnosis not present

## 2024-02-04 DIAGNOSIS — M47896 Other spondylosis, lumbar region: Secondary | ICD-10-CM | POA: Diagnosis not present

## 2024-02-04 DIAGNOSIS — M6281 Muscle weakness (generalized): Secondary | ICD-10-CM | POA: Diagnosis not present

## 2024-02-04 DIAGNOSIS — R262 Difficulty in walking, not elsewhere classified: Secondary | ICD-10-CM | POA: Diagnosis not present

## 2024-02-08 DIAGNOSIS — Z03818 Encounter for observation for suspected exposure to other biological agents ruled out: Secondary | ICD-10-CM | POA: Diagnosis not present

## 2024-02-08 DIAGNOSIS — R5383 Other fatigue: Secondary | ICD-10-CM | POA: Diagnosis not present

## 2024-02-08 DIAGNOSIS — J029 Acute pharyngitis, unspecified: Secondary | ICD-10-CM | POA: Diagnosis not present

## 2024-02-09 DIAGNOSIS — M1711 Unilateral primary osteoarthritis, right knee: Secondary | ICD-10-CM | POA: Diagnosis not present

## 2024-02-11 DIAGNOSIS — M47896 Other spondylosis, lumbar region: Secondary | ICD-10-CM | POA: Diagnosis not present

## 2024-02-11 DIAGNOSIS — R262 Difficulty in walking, not elsewhere classified: Secondary | ICD-10-CM | POA: Diagnosis not present

## 2024-02-11 DIAGNOSIS — M6281 Muscle weakness (generalized): Secondary | ICD-10-CM | POA: Diagnosis not present

## 2024-02-21 DIAGNOSIS — R262 Difficulty in walking, not elsewhere classified: Secondary | ICD-10-CM | POA: Diagnosis not present

## 2024-02-21 DIAGNOSIS — M6281 Muscle weakness (generalized): Secondary | ICD-10-CM | POA: Diagnosis not present

## 2024-02-21 DIAGNOSIS — M47896 Other spondylosis, lumbar region: Secondary | ICD-10-CM | POA: Diagnosis not present

## 2024-02-26 DIAGNOSIS — Z23 Encounter for immunization: Secondary | ICD-10-CM | POA: Diagnosis not present

## 2024-03-03 DIAGNOSIS — M47816 Spondylosis without myelopathy or radiculopathy, lumbar region: Secondary | ICD-10-CM | POA: Diagnosis not present

## 2024-03-14 DIAGNOSIS — M549 Dorsalgia, unspecified: Secondary | ICD-10-CM | POA: Diagnosis not present

## 2024-03-14 DIAGNOSIS — M791 Myalgia, unspecified site: Secondary | ICD-10-CM | POA: Diagnosis not present

## 2024-03-21 ENCOUNTER — Ambulatory Visit: Admitting: Podiatry

## 2024-03-25 DIAGNOSIS — M79642 Pain in left hand: Secondary | ICD-10-CM | POA: Diagnosis not present

## 2024-03-27 DIAGNOSIS — E782 Mixed hyperlipidemia: Secondary | ICD-10-CM | POA: Diagnosis not present

## 2024-03-27 DIAGNOSIS — H409 Unspecified glaucoma: Secondary | ICD-10-CM | POA: Diagnosis not present

## 2024-03-27 DIAGNOSIS — I700 Atherosclerosis of aorta: Secondary | ICD-10-CM | POA: Diagnosis not present

## 2024-03-27 DIAGNOSIS — M810 Age-related osteoporosis without current pathological fracture: Secondary | ICD-10-CM | POA: Diagnosis not present

## 2024-03-27 DIAGNOSIS — I25118 Atherosclerotic heart disease of native coronary artery with other forms of angina pectoris: Secondary | ICD-10-CM | POA: Diagnosis not present

## 2024-03-27 DIAGNOSIS — I4819 Other persistent atrial fibrillation: Secondary | ICD-10-CM | POA: Diagnosis not present

## 2024-03-27 DIAGNOSIS — Z0000 Encounter for general adult medical examination without abnormal findings: Secondary | ICD-10-CM | POA: Diagnosis not present

## 2024-03-27 DIAGNOSIS — Z1331 Encounter for screening for depression: Secondary | ICD-10-CM | POA: Diagnosis not present

## 2024-03-27 DIAGNOSIS — S00511A Abrasion of lip, initial encounter: Secondary | ICD-10-CM | POA: Diagnosis not present

## 2024-03-27 DIAGNOSIS — I10 Essential (primary) hypertension: Secondary | ICD-10-CM | POA: Diagnosis not present

## 2024-03-27 DIAGNOSIS — R7303 Prediabetes: Secondary | ICD-10-CM | POA: Diagnosis not present

## 2024-03-27 DIAGNOSIS — H3530 Unspecified macular degeneration: Secondary | ICD-10-CM | POA: Diagnosis not present

## 2024-03-28 DIAGNOSIS — M79645 Pain in left finger(s): Secondary | ICD-10-CM | POA: Diagnosis not present

## 2024-03-28 DIAGNOSIS — M1812 Unilateral primary osteoarthritis of first carpometacarpal joint, left hand: Secondary | ICD-10-CM | POA: Diagnosis not present

## 2024-03-28 DIAGNOSIS — M25532 Pain in left wrist: Secondary | ICD-10-CM | POA: Diagnosis not present

## 2024-03-29 DIAGNOSIS — M79642 Pain in left hand: Secondary | ICD-10-CM | POA: Diagnosis not present

## 2024-03-29 DIAGNOSIS — M2540 Effusion, unspecified joint: Secondary | ICD-10-CM | POA: Diagnosis not present

## 2024-03-29 DIAGNOSIS — M79641 Pain in right hand: Secondary | ICD-10-CM | POA: Diagnosis not present

## 2024-03-29 DIAGNOSIS — M25569 Pain in unspecified knee: Secondary | ICD-10-CM | POA: Diagnosis not present

## 2024-03-29 DIAGNOSIS — Z6820 Body mass index (BMI) 20.0-20.9, adult: Secondary | ICD-10-CM | POA: Diagnosis not present

## 2024-04-02 DIAGNOSIS — Z23 Encounter for immunization: Secondary | ICD-10-CM | POA: Diagnosis not present

## 2024-04-03 ENCOUNTER — Ambulatory Visit (INDEPENDENT_AMBULATORY_CARE_PROVIDER_SITE_OTHER): Admitting: Podiatry

## 2024-04-03 ENCOUNTER — Encounter: Payer: Self-pay | Admitting: Podiatry

## 2024-04-03 DIAGNOSIS — M79675 Pain in left toe(s): Secondary | ICD-10-CM | POA: Diagnosis not present

## 2024-04-03 DIAGNOSIS — B351 Tinea unguium: Secondary | ICD-10-CM

## 2024-04-03 DIAGNOSIS — M79674 Pain in right toe(s): Secondary | ICD-10-CM

## 2024-04-03 NOTE — Progress Notes (Signed)
 Subjective:  Patient ID: Kendra [name], female    DOB: Aug 16, 1935,  MRN: [id number]  Kendra [name] presents to clinic today for:  Chief Complaint  Patient presents with   [facility]     RFC Non diabetic toenail trim. Fungal nails.    Patient notes nails are thick, discolored, elongated and painful in shoegear when trying to ambulate.  She was recently evaluated by rheumatology for pain in her thumb joint.  She is currently wearing a brace today.  PCP is Sun, Vyvyan, MD.  Past Medical History:  Diagnosis Date   Asthma    CAD (coronary artery disease)    last cath 08/2006   Carotid artery disease    moderate left ICA stenosis by 2.6 ultrasound   DJD (degenerative joint disease), lumbar    Hearing loss    Hyperlipidemia    Renal artery stenosis 05/05/2021   Vision loss of right eye    Past Surgical History:  Procedure Laterality Date   ABDOMINAL HYSTERECTOMY  1975   APPENDECTOMY     CORONARY ANGIOPLASTY WITH STENT PLACEMENT  08/2006   taxus stent x2 to LAD, 2.5x60mm and 2.5x80mm; done at [facility]   CORONARY PRESSURE/FFR STUDY N/A 11/07/2021   Procedure: INTRAVASCULAR PRESSURE WIRE/FFR STUDY;  Surgeon: Wendel Lurena POUR, MD;  Location: [facility];  Service: Cardiovascular;  Laterality: N/A;   LEFT HEART CATH AND CORONARY ANGIOGRAPHY N/A 11/07/2021   Procedure: LEFT HEART CATH AND CORONARY ANGIOGRAPHY;  Surgeon: Wendel Lurena POUR, MD;  Location: [facility];  Service: Cardiovascular;  Laterality: N/A;   RENAL ANGIOGRAM  [facility] in [place]   TONSILLECTOMY  1994   Allergies  Allergen Reactions   Chlorthalidone  Other (See Comments)    Hyponatremia   Other     Other reaction(s): Respiratory Distress   Amlodipine     shivers    Ciprofloxacin Other (See Comments)    UNK reaction   Dog Epithelium (Canis Lupus Familiaris) Other (See Comments)    Cat and dog dander   Irbesartan  Other (See Comments)   Latex Other (See Comments)     Irritates her skin   Mixed Feathers    Nebivolol  Hcl     Other reaction(s): cant remember   Spironolactone      Other Reaction(s): Unknown   Statins     Unable to tolerate statins w the exception of crestor .   Sulfa Antibiotics Other (See Comments)    didnt feel good   Sulfamethoxazole Other (See Comments)    Makes her feel bad   Ticagrelor  Other (See Comments)   Zetia [Ezetimibe] Other (See Comments)    makes me constipated   Penicillins Rash    Review of Systems: Negative except as noted in the HPI.  Objective:  Kendra [name] is a pleasant 88 y.o. female in NAD. AAO x 3.  Vascular Examination: Capillary refill time is 3-5 seconds to toes bilateral. Palpable pedal pulses b/l LE. Digital hair present b/l.  Skin temperature gradient WNL b/l. No varicosities b/l. No cyanosis noted b/l.   Dermatological Examination: Pedal skin with normal turgor, texture and tone b/l. No open wounds. No interdigital macerations b/l. Toenails x10 are 3mm thick, discolored, dystrophic with subungual debris. There is pain with compression of the nail plates.  They are elongated x10  Assessment/Plan: 1. Pain due to onychomycosis of toenails of both feet    The mycotic toenails were sharply debrided x10 with sterile nail nippers  and a power debriding burr to decrease bulk/thickness and length.    We discussed different treatment options for nail fungus.  The patient may try Listerine.  She had tried the formula 7 in the past and states it was not helpful.  We also discussed laser nail treatment.  She will consider this and possibly proceed in the future.  She was informed that this will be performed by the nurse and will be approximately 6 treatments spaced out about 1 month apart.  Return in about 3 months (around 07/04/2024) for RFC.   Awanda CHARM [place], DPM, FACFAS [facility]     [address] [place], [place] [address]                 Office [phone]  Fax [phone]

## 2024-04-07 DIAGNOSIS — M1812 Unilateral primary osteoarthritis of first carpometacarpal joint, left hand: Secondary | ICD-10-CM | POA: Diagnosis not present

## 2024-04-07 DIAGNOSIS — M79642 Pain in left hand: Secondary | ICD-10-CM | POA: Diagnosis not present

## 2024-04-20 DIAGNOSIS — M0609 Rheumatoid arthritis without rheumatoid factor, multiple sites: Secondary | ICD-10-CM | POA: Diagnosis not present

## 2024-04-20 DIAGNOSIS — M2540 Effusion, unspecified joint: Secondary | ICD-10-CM | POA: Diagnosis not present

## 2024-04-20 DIAGNOSIS — Z6820 Body mass index (BMI) 20.0-20.9, adult: Secondary | ICD-10-CM | POA: Diagnosis not present

## 2024-04-20 DIAGNOSIS — M79641 Pain in right hand: Secondary | ICD-10-CM | POA: Diagnosis not present

## 2024-04-20 DIAGNOSIS — M79642 Pain in left hand: Secondary | ICD-10-CM | POA: Diagnosis not present

## 2024-04-21 DIAGNOSIS — H52203 Unspecified astigmatism, bilateral: Secondary | ICD-10-CM | POA: Diagnosis not present

## 2024-04-21 DIAGNOSIS — H5213 Myopia, bilateral: Secondary | ICD-10-CM | POA: Diagnosis not present

## 2024-04-21 DIAGNOSIS — H524 Presbyopia: Secondary | ICD-10-CM | POA: Diagnosis not present

## 2024-04-21 DIAGNOSIS — H353132 Nonexudative age-related macular degeneration, bilateral, intermediate dry stage: Secondary | ICD-10-CM | POA: Diagnosis not present

## 2024-04-21 DIAGNOSIS — H35372 Puckering of macula, left eye: Secondary | ICD-10-CM | POA: Diagnosis not present

## 2024-04-21 DIAGNOSIS — H532 Diplopia: Secondary | ICD-10-CM | POA: Diagnosis not present

## 2024-04-21 DIAGNOSIS — H401233 Low-tension glaucoma, bilateral, severe stage: Secondary | ICD-10-CM | POA: Diagnosis not present

## 2024-04-21 DIAGNOSIS — H04123 Dry eye syndrome of bilateral lacrimal glands: Secondary | ICD-10-CM | POA: Diagnosis not present

## 2024-05-14 DIAGNOSIS — R0981 Nasal congestion: Secondary | ICD-10-CM | POA: Diagnosis not present

## 2024-05-14 DIAGNOSIS — R051 Acute cough: Secondary | ICD-10-CM | POA: Diagnosis not present

## 2024-05-17 DIAGNOSIS — R0989 Other specified symptoms and signs involving the circulatory and respiratory systems: Secondary | ICD-10-CM | POA: Diagnosis not present

## 2024-05-17 DIAGNOSIS — J40 Bronchitis, not specified as acute or chronic: Secondary | ICD-10-CM | POA: Diagnosis not present

## 2024-05-17 DIAGNOSIS — R051 Acute cough: Secondary | ICD-10-CM | POA: Diagnosis not present

## 2024-06-07 ENCOUNTER — Other Ambulatory Visit: Payer: Self-pay

## 2024-06-07 DIAGNOSIS — I25118 Atherosclerotic heart disease of native coronary artery with other forms of angina pectoris: Secondary | ICD-10-CM

## 2024-06-09 MED ORDER — ROSUVASTATIN CALCIUM 10 MG PO TABS: 10.0000 mg | ORAL_TABLET | ORAL | 0 refills | Status: AC

## 2024-07-06 ENCOUNTER — Other Ambulatory Visit (HOSPITAL_BASED_OUTPATIENT_CLINIC_OR_DEPARTMENT_OTHER): Payer: Self-pay | Admitting: Family

## 2024-07-06 DIAGNOSIS — I10 Essential (primary) hypertension: Secondary | ICD-10-CM

## 2024-07-10 ENCOUNTER — Ambulatory Visit: Admitting: Podiatry

## 2024-08-04 ENCOUNTER — Ambulatory Visit: Admitting: Podiatry

## 2024-09-15 ENCOUNTER — Ambulatory Visit (HOSPITAL_BASED_OUTPATIENT_CLINIC_OR_DEPARTMENT_OTHER): Admitting: Family
